# Patient Record
Sex: Female | Born: 1950 | Race: Black or African American | Hispanic: No | Marital: Single | Smoking: Never smoker
Health system: Southern US, Community
[De-identification: ages and names within clinical notes are randomized; demographics above are authoritative.]

## PROBLEM LIST (undated history)

## (undated) DIAGNOSIS — S2691XA Contusion of heart, unspecified with or without hemopericardium, initial encounter: Secondary | ICD-10-CM

## (undated) DIAGNOSIS — I214 Non-ST elevation (NSTEMI) myocardial infarction: Secondary | ICD-10-CM

## (undated) DIAGNOSIS — R7401 Elevation of levels of liver transaminase levels: Secondary | ICD-10-CM

## (undated) DIAGNOSIS — I5042 Chronic combined systolic (congestive) and diastolic (congestive) heart failure: Secondary | ICD-10-CM

## (undated) DIAGNOSIS — I4892 Unspecified atrial flutter: Secondary | ICD-10-CM

## (undated) DIAGNOSIS — R079 Chest pain, unspecified: Secondary | ICD-10-CM

## (undated) DIAGNOSIS — I10 Essential (primary) hypertension: Secondary | ICD-10-CM

## (undated) DIAGNOSIS — R9431 Abnormal electrocardiogram [ECG] [EKG]: Secondary | ICD-10-CM

## (undated) DIAGNOSIS — I255 Ischemic cardiomyopathy: Secondary | ICD-10-CM

## (undated) DIAGNOSIS — R0600 Dyspnea, unspecified: Secondary | ICD-10-CM

## (undated) DIAGNOSIS — R740 Nonspecific elevation of levels of transaminase and lactic acid dehydrogenase [LDH]: Secondary | ICD-10-CM

## (undated) DIAGNOSIS — I2510 Atherosclerotic heart disease of native coronary artery without angina pectoris: Secondary | ICD-10-CM

## (undated) DIAGNOSIS — I5043 Acute on chronic combined systolic (congestive) and diastolic (congestive) heart failure: Secondary | ICD-10-CM

## (undated) DIAGNOSIS — E785 Hyperlipidemia, unspecified: Secondary | ICD-10-CM

## (undated) HISTORY — DX: Chronic combined systolic (congestive) and diastolic (congestive) heart failure: I50.42

## (undated) HISTORY — DX: Atherosclerotic heart disease of native coronary artery without angina pectoris: I25.10

## (undated) HISTORY — DX: Hyperlipidemia, unspecified: E78.5

## (undated) HISTORY — PX: COLONOSCOPY: SHX174

## (undated) HISTORY — DX: Nonspecific elevation of levels of transaminase and lactic acid dehydrogenase (ldh): R74.0

## (undated) HISTORY — DX: Abnormal electrocardiogram (ECG) (EKG): R94.31

## (undated) HISTORY — DX: Ischemic cardiomyopathy: I25.5

## (undated) HISTORY — DX: Elevation of levels of liver transaminase levels: R74.01

---

## 1999-04-20 ENCOUNTER — Encounter: Payer: Self-pay | Admitting: Obstetrics and Gynecology

## 1999-04-20 ENCOUNTER — Encounter: Admission: RE | Admit: 1999-04-20 | Discharge: 1999-04-20 | Payer: Self-pay | Admitting: Obstetrics and Gynecology

## 2000-04-23 ENCOUNTER — Encounter: Admission: RE | Admit: 2000-04-23 | Discharge: 2000-04-23 | Payer: Self-pay | Admitting: Obstetrics and Gynecology

## 2000-04-23 ENCOUNTER — Encounter: Payer: Self-pay | Admitting: Obstetrics and Gynecology

## 2000-08-29 ENCOUNTER — Other Ambulatory Visit: Admission: RE | Admit: 2000-08-29 | Discharge: 2000-08-29 | Payer: Self-pay | Admitting: Obstetrics and Gynecology

## 2001-05-03 ENCOUNTER — Encounter: Payer: Self-pay | Admitting: Obstetrics and Gynecology

## 2001-05-03 ENCOUNTER — Encounter: Admission: RE | Admit: 2001-05-03 | Discharge: 2001-05-03 | Payer: Self-pay | Admitting: Obstetrics and Gynecology

## 2002-05-02 ENCOUNTER — Encounter: Admission: RE | Admit: 2002-05-02 | Discharge: 2002-05-02 | Payer: Self-pay | Admitting: Obstetrics and Gynecology

## 2002-05-02 ENCOUNTER — Encounter: Payer: Self-pay | Admitting: Obstetrics and Gynecology

## 2003-05-15 ENCOUNTER — Encounter: Admission: RE | Admit: 2003-05-15 | Discharge: 2003-05-15 | Payer: Self-pay | Admitting: Obstetrics and Gynecology

## 2004-04-29 ENCOUNTER — Encounter: Admission: RE | Admit: 2004-04-29 | Discharge: 2004-04-29 | Payer: Self-pay | Admitting: Gastroenterology

## 2004-05-20 ENCOUNTER — Ambulatory Visit (HOSPITAL_COMMUNITY): Admission: RE | Admit: 2004-05-20 | Discharge: 2004-05-20 | Payer: Self-pay | Admitting: Gastroenterology

## 2004-05-20 ENCOUNTER — Encounter (INDEPENDENT_AMBULATORY_CARE_PROVIDER_SITE_OTHER): Payer: Self-pay | Admitting: *Deleted

## 2004-05-27 ENCOUNTER — Ambulatory Visit (HOSPITAL_COMMUNITY): Admission: RE | Admit: 2004-05-27 | Discharge: 2004-05-27 | Payer: Self-pay | Admitting: Obstetrics and Gynecology

## 2004-12-25 ENCOUNTER — Emergency Department (HOSPITAL_COMMUNITY): Admission: EM | Admit: 2004-12-25 | Discharge: 2004-12-25 | Payer: Self-pay | Admitting: *Deleted

## 2005-07-25 ENCOUNTER — Encounter: Admission: RE | Admit: 2005-07-25 | Discharge: 2005-07-25 | Payer: Self-pay | Admitting: Obstetrics and Gynecology

## 2006-08-08 ENCOUNTER — Encounter: Admission: RE | Admit: 2006-08-08 | Discharge: 2006-08-08 | Payer: Self-pay | Admitting: Obstetrics and Gynecology

## 2007-08-09 ENCOUNTER — Encounter: Admission: RE | Admit: 2007-08-09 | Discharge: 2007-08-09 | Payer: Self-pay | Admitting: Obstetrics and Gynecology

## 2008-09-04 ENCOUNTER — Encounter: Admission: RE | Admit: 2008-09-04 | Discharge: 2008-09-04 | Payer: Self-pay | Admitting: Obstetrics and Gynecology

## 2009-09-24 ENCOUNTER — Encounter: Admission: RE | Admit: 2009-09-24 | Discharge: 2009-09-24 | Payer: Self-pay | Admitting: Obstetrics and Gynecology

## 2010-10-18 ENCOUNTER — Other Ambulatory Visit: Payer: Self-pay | Admitting: Obstetrics and Gynecology

## 2010-10-18 DIAGNOSIS — Z1231 Encounter for screening mammogram for malignant neoplasm of breast: Secondary | ICD-10-CM

## 2010-10-21 NOTE — Op Note (Signed)
NAMEBAILYNN, DYK              ACCOUNT NO.:  [id number]   MEDICAL RECORD NO.:  [id number]          PATIENT TYPE:  AMB   LOCATION:  ENDO                         FACILITY:  [facility]   PHYSICIAN:  Jordan Hawks. Elnoria Howard, MD    DATE OF BIRTH:  02/02/1951   DATE OF PROCEDURE:  05/20/2004  DATE OF DISCHARGE:                                 OPERATIVE REPORT   PROCEDURE:  Colonoscopy.   PERFORMING PHYSICIAN:  Jordan Hawks. Elnoria Howard, MD   INDICATIONS FOR PROCEDURE:  Screening colonoscopy.   CONSENT:  Informed consent was obtained from the patient describing the  risks of bleeding, infection, perforation, medication reactions, a 10% miss  rate for a small colon cancer or polyp and the risk of death all of which  are not exclusive of any other complications that may occur.   PRE-PROCEDURE PHYSICAL:  CARDIAC:  Regular rate and rhythm.  LUNGS:  Clear to auscultation bilaterally.  ABDOMEN: Soft, non-tender and non-distended.   MEDICATIONS:  Versed 4 mg IV and Demerol 50 mg IV.   DESCRIPTION OF PROCEDURE:  After adequate sedation was achieved, a rectal  examination was performed which was negative for any palpable abnormalities.  The colonoscope was then inserted and advanced under direct visualization to  the terminal ileum with mild difficulty.  Photo documentation of the  terminal ileum and the cecum was obtained and the patient was noted to have  an excellent prep.  Upon slow withdrawal of the colonoscope, there was no  evidence of any masses, polyps, ulcerations, erosions or vascular  abnormalities in the cecum, ascending, transverse, descending or sigmoid  colon.  The patient was noted to have a few scattered diverticula within the  sigmoid region.  Retroflexion in the rectum was positive for a 2 mm sessile  polyp.  A cold snare polypectomy was performed and no complications were  encountered.  The polyp was retrieved intact.  The patient was also noted to  have mild external hemorrhoids with  retroflexion in the rectum. The  colonoscope was then straightened and withdrawn from the patient.  The  procedure was terminated.  No complications were encountered and the patient  tolerated the procedure well.   PLAN:  1.  Follow-up on biopsies.  2.  Repeat colonoscopy in five to seven years.       PDH/MEDQ  D:  05/20/2004  T:  05/20/2004  Job:  [id number]   cc:   Merlene Laughter. Renae Gloss, M.D.  [address]  [address]  [place]  [place] [address]  Fax: [phone]

## 2010-11-11 ENCOUNTER — Ambulatory Visit
Admission: RE | Admit: 2010-11-11 | Discharge: 2010-11-11 | Disposition: A | Discharge status: Home / Self Care | Payer: No Typology Code available for payment source | Source: Ambulatory Visit | Attending: Obstetrics and Gynecology | Admitting: Obstetrics and Gynecology

## 2010-11-11 DIAGNOSIS — Z1231 Encounter for screening mammogram for malignant neoplasm of breast: Secondary | ICD-10-CM

## 2010-12-13 ENCOUNTER — Inpatient Hospital Stay (INDEPENDENT_AMBULATORY_CARE_PROVIDER_SITE_OTHER)
Admission: RE | Admit: 2010-12-13 | Discharge: 2010-12-13 | Disposition: A | Discharge status: Home / Self Care | Payer: No Typology Code available for payment source | Source: Ambulatory Visit | Attending: Emergency Medicine | Admitting: Emergency Medicine

## 2010-12-13 DIAGNOSIS — J40 Bronchitis, not specified as acute or chronic: Secondary | ICD-10-CM

## 2011-11-14 ENCOUNTER — Other Ambulatory Visit: Payer: Self-pay | Admitting: Obstetrics and Gynecology

## 2011-11-14 DIAGNOSIS — Z1231 Encounter for screening mammogram for malignant neoplasm of breast: Secondary | ICD-10-CM

## 2011-11-29 ENCOUNTER — Ambulatory Visit
Admission: RE | Admit: 2011-11-29 | Discharge: 2011-11-29 | Disposition: A | Discharge status: Home / Self Care | Payer: PRIVATE HEALTH INSURANCE | Source: Ambulatory Visit | Attending: Obstetrics and Gynecology | Admitting: Obstetrics and Gynecology

## 2011-11-29 DIAGNOSIS — Z1231 Encounter for screening mammogram for malignant neoplasm of breast: Secondary | ICD-10-CM

## 2012-12-12 ENCOUNTER — Other Ambulatory Visit: Payer: Self-pay

## 2012-12-12 DIAGNOSIS — Z1231 Encounter for screening mammogram for malignant neoplasm of breast: Secondary | ICD-10-CM

## 2012-12-26 ENCOUNTER — Ambulatory Visit: Payer: PRIVATE HEALTH INSURANCE

## 2013-01-08 ENCOUNTER — Ambulatory Visit: Payer: PRIVATE HEALTH INSURANCE

## 2013-01-22 ENCOUNTER — Ambulatory Visit
Admission: RE | Admit: 2013-01-22 | Discharge: 2013-01-22 | Disposition: A | Discharge status: Home / Self Care | Payer: PRIVATE HEALTH INSURANCE | Source: Ambulatory Visit

## 2013-01-22 DIAGNOSIS — Z1231 Encounter for screening mammogram for malignant neoplasm of breast: Secondary | ICD-10-CM

## 2013-03-09 ENCOUNTER — Encounter (HOSPITAL_COMMUNITY): Payer: Self-pay | Admitting: *Deleted

## 2013-03-09 ENCOUNTER — Emergency Department (HOSPITAL_COMMUNITY): Payer: PRIVATE HEALTH INSURANCE

## 2013-03-09 ENCOUNTER — Observation Stay (HOSPITAL_COMMUNITY)
Admission: EM | Admit: 2013-03-09 | Discharge: 2013-03-10 | Disposition: A | Discharge status: Home / Self Care | Payer: PRIVATE HEALTH INSURANCE | Attending: Internal Medicine | Admitting: Internal Medicine

## 2013-03-09 DIAGNOSIS — S20219A Contusion of unspecified front wall of thorax, initial encounter: Secondary | ICD-10-CM | POA: Diagnosis not present

## 2013-03-09 DIAGNOSIS — I10 Essential (primary) hypertension: Secondary | ICD-10-CM | POA: Diagnosis not present

## 2013-03-09 DIAGNOSIS — E7800 Pure hypercholesterolemia, unspecified: Secondary | ICD-10-CM | POA: Insufficient documentation

## 2013-03-09 DIAGNOSIS — S2691XA Contusion of heart, unspecified with or without hemopericardium, initial encounter: Secondary | ICD-10-CM

## 2013-03-09 DIAGNOSIS — Y939 Activity, unspecified: Secondary | ICD-10-CM | POA: Diagnosis not present

## 2013-03-09 DIAGNOSIS — S2611XA Contusion of heart without hemopericardium, initial encounter: Secondary | ICD-10-CM

## 2013-03-09 DIAGNOSIS — R079 Chest pain, unspecified: Secondary | ICD-10-CM | POA: Diagnosis present

## 2013-03-09 DIAGNOSIS — E785 Hyperlipidemia, unspecified: Secondary | ICD-10-CM | POA: Diagnosis present

## 2013-03-09 DIAGNOSIS — Z79899 Other long term (current) drug therapy: Secondary | ICD-10-CM | POA: Insufficient documentation

## 2013-03-09 DIAGNOSIS — I499 Cardiac arrhythmia, unspecified: Secondary | ICD-10-CM

## 2013-03-09 DIAGNOSIS — Y929 Unspecified place or not applicable: Secondary | ICD-10-CM | POA: Insufficient documentation

## 2013-03-09 HISTORY — DX: Essential (primary) hypertension: I10

## 2013-03-09 HISTORY — DX: Contusion of heart, unspecified with or without hemopericardium, initial encounter: S26.91XA

## 2013-03-09 LAB — CBC
HCT: 44.2 % (ref 36.0–46.0)
Hemoglobin: 15.5 g/dL — ABNORMAL HIGH (ref 12.0–15.0)
MCH: 30.8 pg (ref 26.0–34.0)
MCHC: 35.1 g/dL (ref 30.0–36.0)
MCV: 87.7 fL (ref 78.0–100.0)
Platelets: 187 10*3/uL (ref 150–400)
RBC: 5.04 MIL/uL (ref 3.87–5.11)
RDW: 14.3 % (ref 11.5–15.5)
WBC: 6.9 10*3/uL (ref 4.0–10.5)

## 2013-03-09 LAB — BASIC METABOLIC PANEL
BUN: 13 mg/dL (ref 6–23)
CO2: 28 mEq/L (ref 19–32)
Calcium: 9.8 mg/dL (ref 8.4–10.5)
Chloride: 97 mEq/L (ref 96–112)
Creatinine, Ser: 0.62 mg/dL (ref 0.50–1.10)
GFR calc Af Amer: >90 mL/min (ref >90)
GFR calc non Af Amer: >90 mL/min (ref >90)
Glucose, Bld: 97 mg/dL (ref 70–99)
Potassium: 4.1 mEq/L (ref 3.5–5.1)
Sodium: 138 mEq/L (ref 135–145)

## 2013-03-09 LAB — POCT I-STAT TROPONIN I: Troponin i, poc: 0 ng/mL (ref 0.00–0.08)

## 2013-03-09 LAB — PRO B NATRIURETIC PEPTIDE: Pro B Natriuretic peptide (BNP): 322.4 pg/mL — ABNORMAL HIGH (ref 0–125)

## 2013-03-09 LAB — TROPONIN I: Troponin I: <0.3 ng/mL (ref <0.30)

## 2013-03-09 MED ORDER — IOHEXOL 350 MG/ML SOLN
100.0000 mL | Freq: Once | INTRAVENOUS | Status: AC | PRN
  Administered 2013-03-09: 100 mL via INTRAVENOUS

## 2013-03-09 MED ORDER — SODIUM CHLORIDE 0.9 % IJ SOLN
3.0000 mL | Freq: Two times a day (BID) | INTRAMUSCULAR | Status: DC
  Administered 2013-03-10: 3 mL via INTRAVENOUS

## 2013-03-09 MED ORDER — MORPHINE SULFATE 2 MG/ML IJ SOLN
1.0000 mg | INTRAMUSCULAR | Status: DC | PRN
  Administered 2013-03-10: 1 mg via INTRAVENOUS
  Filled 2013-03-09: qty 1

## 2013-03-09 MED ORDER — SIMVASTATIN 20 MG PO TABS
20.0000 mg | ORAL_TABLET | Freq: Every day | ORAL | Status: DC
  Administered 2013-03-09: 20 mg via ORAL
  Filled 2013-03-09 (×2): qty 1

## 2013-03-09 MED ORDER — LOSARTAN POTASSIUM 50 MG PO TABS
100.0000 mg | ORAL_TABLET | Freq: Every day | ORAL | Status: DC
  Administered 2013-03-10: 100 mg via ORAL
  Filled 2013-03-09: qty 2

## 2013-03-09 MED ORDER — ASPIRIN EC 325 MG PO TBEC
325.0000 mg | DELAYED_RELEASE_TABLET | Freq: Every day | ORAL | Status: DC
  Administered 2013-03-09 – 2013-03-10 (×2): 325 mg via ORAL
  Filled 2013-03-09 (×2): qty 1

## 2013-03-09 MED ORDER — LOSARTAN POTASSIUM-HCTZ 100-25 MG PO TABS: 1.0000 | ORAL_TABLET | Freq: Every day | ORAL | Status: DC

## 2013-03-09 MED ORDER — HYDROCHLOROTHIAZIDE 25 MG PO TABS
25.0000 mg | ORAL_TABLET | Freq: Every day | ORAL | Status: DC
  Administered 2013-03-10: 25 mg via ORAL
  Filled 2013-03-09: qty 1
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
## 2013-03-10 DIAGNOSIS — I359 Nonrheumatic aortic valve disorder, unspecified: Secondary | ICD-10-CM

## 2013-03-10 DIAGNOSIS — I10 Essential (primary) hypertension: Secondary | ICD-10-CM | POA: Diagnosis not present

## 2013-03-10 LAB — TROPONIN I: Troponin I: <0.3 ng/mL (ref <0.30)

## 2013-03-10 MED ORDER — HYDROCODONE-ACETAMINOPHEN 5-325 MG PO TABS: 1.0000 | ORAL_TABLET | Freq: Four times a day (QID) | ORAL | Status: DC | PRN
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
## 2013-03-10 NOTE — Progress Notes (Signed)
Utilization review completed.  

## 2013-03-10 NOTE — Progress Notes (Signed)
D/c orders received:IV removed with gauze on, pt remains in stable condition, reviewed pt meds and instructions and given to pt; pt d/c to home

## 2013-03-10 NOTE — Progress Notes (Signed)
*  PRELIMINARY RESULTS* Echocardiogram 2D Echocardiogram has been performed.  Sheri Dunn 03/10/2013, 11:08 AM

## 2013-03-10 NOTE — Progress Notes (Signed)
[facility]  [address] [id number] [place] [address] Phone: [phone]  March 10, 2013  Patient: Sheri Dunn  Date of Birth: 1951/04/08  Date of Visit: 03/09/2013    To Whom It May Concern:  Sheri Dunn was seen and treated in our emergency department on 03/09/2013. Kyle Stansell  may return to work on 03/17/13.  Sincerely,     Crista Curb, M.D.

## 2014-01-20 ENCOUNTER — Other Ambulatory Visit: Payer: Self-pay

## 2014-01-20 DIAGNOSIS — Z1231 Encounter for screening mammogram for malignant neoplasm of breast: Secondary | ICD-10-CM

## 2014-02-10 ENCOUNTER — Ambulatory Visit
Admission: RE | Admit: 2014-02-10 | Discharge: 2014-02-10 | Disposition: A | Discharge status: Home / Self Care | Payer: PRIVATE HEALTH INSURANCE | Source: Ambulatory Visit

## 2014-02-10 DIAGNOSIS — Z1231 Encounter for screening mammogram for malignant neoplasm of breast: Secondary | ICD-10-CM

## 2014-09-05 ENCOUNTER — Inpatient Hospital Stay (HOSPITAL_COMMUNITY)
Admission: EM | Admit: 2014-09-05 | Discharge: 2014-09-09 | DRG: 246 | Disposition: A | Discharge status: Home / Self Care | Payer: PRIVATE HEALTH INSURANCE | Attending: Internal Medicine | Admitting: Internal Medicine

## 2014-09-05 ENCOUNTER — Emergency Department (HOSPITAL_COMMUNITY): Payer: PRIVATE HEALTH INSURANCE

## 2014-09-05 ENCOUNTER — Encounter (HOSPITAL_COMMUNITY): Payer: Self-pay | Admitting: *Deleted

## 2014-09-05 ENCOUNTER — Emergency Department (HOSPITAL_COMMUNITY)
Admission: EM | Admit: 2014-09-05 | Discharge: 2014-09-05 | Disposition: A | Discharge status: Nursing Home (Includes ICF) | Payer: PRIVATE HEALTH INSURANCE | Source: Home / Self Care | Attending: Family Medicine | Admitting: Family Medicine

## 2014-09-05 ENCOUNTER — Encounter (HOSPITAL_COMMUNITY): Payer: Self-pay | Admitting: Emergency Medicine

## 2014-09-05 DIAGNOSIS — I509 Heart failure, unspecified: Secondary | ICD-10-CM

## 2014-09-05 DIAGNOSIS — Z8249 Family history of ischemic heart disease and other diseases of the circulatory system: Secondary | ICD-10-CM

## 2014-09-05 DIAGNOSIS — R739 Hyperglycemia, unspecified: Secondary | ICD-10-CM | POA: Diagnosis present

## 2014-09-05 DIAGNOSIS — Z91128 Patient's intentional underdosing of medication regimen for other reason: Secondary | ICD-10-CM | POA: Diagnosis present

## 2014-09-05 DIAGNOSIS — I255 Ischemic cardiomyopathy: Secondary | ICD-10-CM

## 2014-09-05 DIAGNOSIS — I5020 Unspecified systolic (congestive) heart failure: Secondary | ICD-10-CM | POA: Diagnosis not present

## 2014-09-05 DIAGNOSIS — I5021 Acute systolic (congestive) heart failure: Secondary | ICD-10-CM

## 2014-09-05 DIAGNOSIS — I2510 Atherosclerotic heart disease of native coronary artery without angina pectoris: Secondary | ICD-10-CM | POA: Diagnosis present

## 2014-09-05 DIAGNOSIS — I5041 Acute combined systolic (congestive) and diastolic (congestive) heart failure: Secondary | ICD-10-CM | POA: Diagnosis present

## 2014-09-05 DIAGNOSIS — I10 Essential (primary) hypertension: Secondary | ICD-10-CM | POA: Diagnosis present

## 2014-09-05 DIAGNOSIS — R945 Abnormal results of liver function studies: Secondary | ICD-10-CM | POA: Diagnosis present

## 2014-09-05 DIAGNOSIS — R7989 Other specified abnormal findings of blood chemistry: Secondary | ICD-10-CM | POA: Diagnosis present

## 2014-09-05 DIAGNOSIS — I4581 Long QT syndrome: Secondary | ICD-10-CM | POA: Diagnosis not present

## 2014-09-05 DIAGNOSIS — R079 Chest pain, unspecified: Secondary | ICD-10-CM

## 2014-09-05 DIAGNOSIS — R740 Nonspecific elevation of levels of transaminase and lactic acid dehydrogenase [LDH]: Secondary | ICD-10-CM | POA: Diagnosis present

## 2014-09-05 DIAGNOSIS — E785 Hyperlipidemia, unspecified: Secondary | ICD-10-CM | POA: Diagnosis not present

## 2014-09-05 DIAGNOSIS — E780 Pure hypercholesterolemia: Secondary | ICD-10-CM | POA: Diagnosis present

## 2014-09-05 DIAGNOSIS — R0600 Dyspnea, unspecified: Secondary | ICD-10-CM

## 2014-09-05 DIAGNOSIS — I214 Non-ST elevation (NSTEMI) myocardial infarction: Principal | ICD-10-CM

## 2014-09-05 DIAGNOSIS — Z9119 Patient's noncompliance with other medical treatment and regimen: Secondary | ICD-10-CM | POA: Diagnosis present

## 2014-09-05 DIAGNOSIS — T465X6A Underdosing of other antihypertensive drugs, initial encounter: Secondary | ICD-10-CM | POA: Diagnosis present

## 2014-09-05 DIAGNOSIS — Z955 Presence of coronary angioplasty implant and graft: Secondary | ICD-10-CM

## 2014-09-05 DIAGNOSIS — R9431 Abnormal electrocardiogram [ECG] [EKG]: Secondary | ICD-10-CM | POA: Diagnosis present

## 2014-09-05 DIAGNOSIS — R002 Palpitations: Secondary | ICD-10-CM

## 2014-09-05 DIAGNOSIS — R778 Other specified abnormalities of plasma proteins: Secondary | ICD-10-CM | POA: Diagnosis present

## 2014-09-05 LAB — BASIC METABOLIC PANEL
ANION GAP: 12 (ref 5–15)
BUN: 24 mg/dL — ABNORMAL HIGH (ref 6–23)
CHLORIDE: 110 mmol/L (ref 96–112)
CO2: 17 mmol/L — AB (ref 19–32)
CREATININE: 0.83 mg/dL (ref 0.50–1.10)
Calcium: 9.1 mg/dL (ref 8.4–10.5)
GFR calc Af Amer: 84 mL/min — ABNORMAL LOW (ref >90)
GFR calc non Af Amer: 72 mL/min — ABNORMAL LOW (ref >90)
Glucose, Bld: 113 mg/dL — ABNORMAL HIGH (ref 70–99)
POTASSIUM: 4.2 mmol/L (ref 3.5–5.1)
SODIUM: 139 mmol/L (ref 135–145)

## 2014-09-05 LAB — CBC
HEMATOCRIT: 42.3 % (ref 36.0–46.0)
HEMOGLOBIN: 13.7 g/dL (ref 12.0–15.0)
MCH: 28.5 pg (ref 26.0–34.0)
MCHC: 32.4 g/dL (ref 30.0–36.0)
MCV: 88.1 fL (ref 78.0–100.0)
Platelets: 180 10*3/uL (ref 150–400)
RBC: 4.8 MIL/uL (ref 3.87–5.11)
RDW: 15.3 % (ref 11.5–15.5)
WBC: 7.5 10*3/uL (ref 4.0–10.5)

## 2014-09-05 LAB — D-DIMER, QUANTITATIVE (NOT AT ARMC): D DIMER QUANT: 1.41 ug{FEU}/mL — AB (ref 0.00–0.48)

## 2014-09-05 LAB — I-STAT TROPONIN, ED: Troponin i, poc: 0.05 ng/mL (ref 0.00–0.08)

## 2014-09-05 LAB — BRAIN NATRIURETIC PEPTIDE: B Natriuretic Peptide: 4342.6 pg/mL — ABNORMAL HIGH (ref 0.0–100.0)

## 2014-09-05 LAB — TROPONIN I: Troponin I: 0.08 ng/mL — ABNORMAL HIGH (ref <0.031)

## 2014-09-05 MED ORDER — FUROSEMIDE 10 MG/ML IJ SOLN
20.0000 mg | Freq: Once | INTRAMUSCULAR | Status: AC
  Administered 2014-09-05: 20 mg via INTRAVENOUS
  Filled 2014-09-05: qty 2

## 2014-09-05 MED ORDER — NITROGLYCERIN 2 % TD OINT
1.0000 [in_us] | TOPICAL_OINTMENT | Freq: Three times a day (TID) | TRANSDERMAL | Status: DC
  Administered 2014-09-06 – 2014-09-07 (×5): 1 [in_us] via TOPICAL
  Filled 2014-09-05: qty 30

## 2014-09-05 MED ORDER — SODIUM CHLORIDE 0.9 % IJ SOLN
3.0000 mL | Freq: Two times a day (BID) | INTRAMUSCULAR | Status: DC
  Administered 2014-09-07 – 2014-09-09 (×2): 3 mL via INTRAVENOUS

## 2014-09-05 MED ORDER — SODIUM CHLORIDE 0.9 % IJ SOLN: 3.0000 mL | INTRAMUSCULAR | Status: DC | PRN

## 2014-09-05 MED ORDER — IOHEXOL 350 MG/ML SOLN
100.0000 mL | Freq: Once | INTRAVENOUS | Status: AC | PRN
  Administered 2014-09-05: 100 mL via INTRAVENOUS

## 2014-09-05 MED ORDER — SODIUM CHLORIDE 0.9 % IJ SOLN
3.0000 mL | Freq: Two times a day (BID) | INTRAMUSCULAR | Status: DC
  Administered 2014-09-06 – 2014-09-08 (×6): 3 mL via INTRAVENOUS

## 2014-09-05 MED ORDER — SODIUM CHLORIDE 0.9 % IV SOLN: 250.0000 mL | INTRAVENOUS | Status: DC | PRN

## 2014-09-05 MED ORDER — NITROGLYCERIN 2 % TD OINT
1.0000 [in_us] | TOPICAL_OINTMENT | Freq: Once | TRANSDERMAL | Status: AC
  Administered 2014-09-05: 1 [in_us] via TOPICAL
  Filled 2014-09-05: qty 1

## 2014-09-05 MED ORDER — ACETAMINOPHEN 650 MG RE SUPP: 650.0000 mg | Freq: Four times a day (QID) | RECTAL | Status: DC | PRN

## 2014-09-05 MED ORDER — ATORVASTATIN CALCIUM 80 MG PO TABS
80.0000 mg | ORAL_TABLET | Freq: Every day | ORAL | Status: DC
Start: 2014-09-06 — End: 2014-09-07
  Administered 2014-09-06: 80 mg via ORAL
  Filled 2014-09-05 (×2): qty 1

## 2014-09-05 MED ORDER — IRBESARTAN 75 MG PO TABS
75.0000 mg | ORAL_TABLET | Freq: Every day | ORAL | Status: DC
  Filled 2014-09-05: qty 1

## 2014-09-05 MED ORDER — ENOXAPARIN SODIUM 80 MG/0.8ML ~~LOC~~ SOLN
1.0000 mg/kg | Freq: Once | SUBCUTANEOUS | Status: AC
  Administered 2014-09-06: 65 mg via SUBCUTANEOUS
  Filled 2014-09-05: qty 0.8

## 2014-09-05 MED ORDER — ASPIRIN EC 325 MG PO TBEC
325.0000 mg | DELAYED_RELEASE_TABLET | Freq: Every day | ORAL | Status: DC
Start: 2014-09-06 — End: 2014-09-07
  Administered 2014-09-06 – 2014-09-07 (×2): 325 mg via ORAL
  Filled 2014-09-05 (×2): qty 1

## 2014-09-05 MED ORDER — HYDRALAZINE HCL 20 MG/ML IJ SOLN: 10.0000 mg | Freq: Four times a day (QID) | INTRAMUSCULAR | Status: DC | PRN

## 2014-09-05 MED ORDER — ACETAMINOPHEN 325 MG PO TABS: 650.0000 mg | ORAL_TABLET | Freq: Four times a day (QID) | ORAL | Status: DC | PRN

## 2014-09-05 MED ORDER — FUROSEMIDE 10 MG/ML IJ SOLN
20.0000 mg | Freq: Two times a day (BID) | INTRAMUSCULAR | Status: DC
  Filled 2014-09-05 (×2): qty 2

## 2014-09-05 NOTE — ED Notes (Signed)
Per CT, pt's IV infiltrated, and approx 50mL of fluid was infused in the pt's forearm. Ice was applied, and Dr. Hyacinth MeekerMiller assessed the pt's IV site. They have started a new IV to complete the scans.

## 2014-09-05 NOTE — ED Provider Notes (Signed)
Sheri Dunn Sheri Dunn a 64 y.o. female who presents to Urgent Care today for chest soreness shortness of breath and palpitations. Symptoms present for about one week. Patient notes her symptoms are worse with exertion and better with rest. She describes a heart fluttering sensation associated with increased work of breathing. No fevers or chills nausea vomiting or diarrhea. No leg swelling. No radiating pain.   Past Medical History  Diagnosis Date  . Hypertension   . High cholesterol    No past surgical history on file. History  Substance Use Topics  . Smoking status: Never Smoker   . Smokeless tobacco: Not on file  . Alcohol Use: No   ROS as above Medications: No current facility-administered medications for this encounter.   Current Outpatient Prescriptions  Medication Sig Dispense Refill  . HYDROcodone-acetaminophen (NORCO/VICODIN) 5-325 MG per tablet Take 1 tablet by mouth every 6 (six) hours as needed for pain. 20 tablet 0  . losartan-hydrochlorothiazide (HYZAAR) 100-25 MG per tablet Take 1 tablet by mouth daily.    . simvastatin (ZOCOR) 20 MG tablet Take 20 mg by mouth daily.     No Known Allergies   Exam:  BP 142/101 mmHg  Pulse 90  Temp(Src) 97.7 F (36.5 C) (Oral)  Resp 18  SpO2 100% Gen: Well NAD HEENT: EOMI,  MMM Lungs: Normal work of breathing. CTABL Heart: Irregularly irregular on auscultated exam no MRG Abd: NABS, Soft. Nondistended, Nontender Exts: Brisk capillary refill, warm and well perfused.   ED ECG REPORT   Date: 09/05/2014  Rate: 90  Rhythm: premature ventricular contractions (PVC)  QRS Axis: normal  Intervals: QT prolonged and 484  ST/T Wave abnormalities: Lateral precordial leads T-wave inversions. No ST segment elevation.  Conduction Disutrbances:none  Narrative Interpretation: Normal sinus rhythm with frequent regular PVCs. Morphology of non-PVC beats is abnormal with lateral precordial T wave inversions. No significant ST segment elevation or  depression.  Old EKG Reviewed: changes noted  I have personally reviewed the EKG tracing and agree with the computerized printout as noted.   No results found for this or any previous visit (from the past 24 hour(s)). No results found.  Assessment and Plan: 64 y.o. female with palpitations associated with frequent PVCs and T-wave inversions. Transfer to ED for further evaluation and management. Patient declines EMS transport. We'll use shuttle.  Discussed warning signs or symptoms. Please see discharge instructions. Patient expresses understanding.     Rodolph BongEvan S Adonte Vanriper, MD 09/05/14 1420

## 2014-09-05 NOTE — H&P (Signed)
Sheri Dunn is an 64 y.o. female.    Willey Blade PA Chief Complaint: dyspnea HPI: 64 yo female with htn, hyperlipidemia c/o dyspnea since Monday.  Denies fever, chills, cp, palp, n/v, diarrhea, wt gain, lower ext edema.   Pt presented to ED and bp elevated, and CXR showed CHF.  Pt will be admitted for new onset CHF.   Past Medical History  Diagnosis Date  . Hypertension   . High cholesterol     History reviewed. No pertinent past surgical history.  Family History  Problem Relation Age of Onset  . Heart attack Mother    Social History:  reports that she has never smoked. She does not have any smokeless tobacco history on file. She reports that she does not drink alcohol or use illicit drugs.  Allergies: No Known Allergies Medications reviewed  (Not in a hospital admission)  Results for orders placed or performed during the hospital encounter of 09/05/14 (from the past 48 hour(s))  CBC     Status: None   Collection Time: 09/05/14  3:11 PM  Result Value Ref Range   WBC 7.5 4.0 - 10.5 K/uL   RBC 4.80 3.87 - 5.11 MIL/uL   Hemoglobin 13.7 12.0 - 15.0 g/dL   HCT 42.3 36.0 - 46.0 %   MCV 88.1 78.0 - 100.0 fL   MCH 28.5 26.0 - 34.0 pg   MCHC 32.4 30.0 - 36.0 g/dL   RDW 15.3 11.5 - 15.5 %   Platelets 180 150 - 400 K/uL  Basic metabolic panel     Status: Abnormal   Collection Time: 09/05/14  3:11 PM  Result Value Ref Range   Sodium 139 135 - 145 mmol/L   Potassium 4.2 3.5 - 5.1 mmol/L   Chloride 110 96 - 112 mmol/L   CO2 17 (L) 19 - 32 mmol/L   Glucose, Bld 113 (H) 70 - 99 mg/dL   BUN 24 (H) 6 - 23 mg/dL   Creatinine, Ser 0.83 0.50 - 1.10 mg/dL   Calcium 9.1 8.4 - 10.5 mg/dL   GFR calc non Af Amer 72 (L) >90 mL/min   GFR calc Af Amer 84 (L) >90 mL/min    Comment: (NOTE) The eGFR has been calculated using the CKD EPI equation. This calculation has not been validated in all clinical situations. eGFR's persistently <90 mL/min signify possible Chronic Kidney Disease.     Anion gap 12 5 - 15  BNP (order ONLY if patient complains of dyspnea/SOB AND you have documented it for THIS visit)     Status: Abnormal   Collection Time: 09/05/14  3:11 PM  Result Value Ref Range   B Natriuretic Peptide 4342.6 (H) 0.0 - 100.0 pg/mL  I-stat troponin, ED (not at [facility])     Status: None   Collection Time: 09/05/14  3:26 PM  Result Value Ref Range   Troponin i, poc 0.05 0.00 - 0.08 ng/mL   Comment 3            Comment: Due to the release kinetics of cTnI, a negative result within the first hours of the onset of symptoms does not rule out myocardial infarction with certainty. If myocardial infarction is still suspected, repeat the test at appropriate intervals.   Troponin I     Status: Abnormal   Collection Time: 09/05/14  5:41 PM  Result Value Ref Range   Troponin I 0.08 (H) <0.031 ng/mL    Comment:        PERSISTENTLY INCREASED  TROPONIN VALUES IN THE RANGE OF 0.04-0.49 ng/mL CAN BE SEEN IN:       -UNSTABLE ANGINA       -CONGESTIVE HEART FAILURE       -MYOCARDITIS       -CHEST TRAUMA       -ARRYHTHMIAS       -LATE PRESENTING MYOCARDIAL INFARCTION       -COPD   CLINICAL FOLLOW-UP RECOMMENDED.   D-dimer, quantitative     Status: Abnormal   Collection Time: 09/05/14  5:41 PM  Result Value Ref Range   D-Dimer, Quant 1.41 (H) 0.00 - 0.48 ug/mL-FEU    Comment:        AT THE INHOUSE ESTABLISHED CUTOFF VALUE OF 0.48 ug/mL FEU, THIS ASSAY HAS BEEN DOCUMENTED IN THE LITERATURE TO HAVE A SENSITIVITY AND NEGATIVE PREDICTIVE VALUE OF AT LEAST 98 TO 99%.  THE TEST RESULT SHOULD BE CORRELATED WITH AN ASSESSMENT OF THE CLINICAL PROBABILITY OF DVT / VTE.    Dg Chest 2 View  09/05/2014   CLINICAL DATA:  Dry cough and shortness of breath for 5 days  EXAM: CHEST  2 VIEW  COMPARISON:  03/09/2013  FINDINGS: Cardiac shadow is enlarged but stable. The lungs are hyperinflated. Mild interstitial changes are seen although no focal infiltrate is noted. No bony abnormality is  seen.  IMPRESSION: COPD without acute abnormality   Electronically Signed   By: Inez Catalina M.D.   On: 09/05/2014 16:01   Ct Angio Chest Pe W/cm &/or Wo Cm  09/05/2014   CLINICAL DATA:  Dyspnea.  Chest soreness.  Shortness of breath.  EXAM: CT ANGIOGRAPHY CHEST WITH CONTRAST  TECHNIQUE: Multidetector CT imaging of the chest was performed using the standard protocol during bolus administration of intravenous contrast. Multiplanar CT image reconstructions and MIPs were obtained to evaluate the vascular anatomy.  CONTRAST:  170m OMNIPAQUE IOHEXOL 350 MG/ML SOLN  COMPARISON:  03/09/2013  FINDINGS: Despite efforts by the technologist and patient, motion artifact is present on today's exam and could not be eliminated. This reduces exam sensitivity and specificity.  Mediastinum/Nodes: No filling defect is identified in the pulmonary arterial tree to suggest pulmonary embolus.  Imaging is performed prior to aortic opacification and accordingly today' s exam is not sensitive in assessing the aorta 4 certain vascular conditions such as dissection.  Borderline prominent AP window lymph nodes. There is moderate to prominent cardiomegaly primarily involving the left heart. Coronary atherosclerosis. Large main pulmonary artery at 4.1 cm ; ascending aorta at 3.9 cm.  Lungs/Pleura: Secondary pulmonary lobular interstitial thickening is present in the lungs although is mild. There is bibasilar atelectasis along with bilateral airway thickening. Small right and trace left pleural effusions  Upper abdomen: Unremarkable  Musculoskeletal: Unremarkable  Review of the MIP images confirms the above findings.  IMPRESSION: 1. No filling defect is identified in the pulmonary arterial tree to suggest pulmonary embolus. 2. Moderate to prominent cardiomegaly especially involving the left heart, with coronary atherosclerosis, and also interstitial accentuation which may reflect interstitial pulmonary edema. 3. Small right and trace left  pleural effusions. Mild atelectasis in both lower lobes. 4. Airway thickening may reflect mild interstitial edema or bronchitis. 5. Prominent main pulmonary artery could reflect pulmonary arterial hypertension.   Electronically Signed   By: WVan ClinesM.D.   On: 09/05/2014 21:41    Review of Systems  Constitutional: Negative.   HENT: Negative.   Eyes: Negative.   Respiratory: Positive for shortness of breath. Negative for cough, hemoptysis,  sputum production and wheezing.   Cardiovascular: Negative.   Gastrointestinal: Negative.   Genitourinary: Negative.   Musculoskeletal: Negative.   Skin: Negative.   Neurological: Negative.   Endo/Heme/Allergies: Negative.   Psychiatric/Behavioral: Negative.     Blood pressure 162/86, pulse 94, temperature 97.7 F (36.5 C), temperature source Oral, resp. rate 23, SpO2 98 %. Physical Exam  Constitutional: She is oriented to person, place, and time. She appears well-developed and well-nourished.  HENT:  Head: Normocephalic and atraumatic.  Mouth/Throat: No oropharyngeal exudate.  Eyes: Conjunctivae and EOM are normal. Pupils are equal, round, and reactive to light. No scleral icterus.  Neck: Normal range of motion. Neck supple. JVD present. No tracheal deviation present. No thyromegaly present.  Cardiovascular: Normal rate and regular rhythm.  Exam reveals no gallop and no friction rub.   No murmur heard. Respiratory: Effort normal. No respiratory distress. She has no wheezes. She has rales. She exhibits no tenderness.  GI: Soft. Bowel sounds are normal. She exhibits no distension. There is no tenderness. There is no rebound and no guarding.  Musculoskeletal: Normal range of motion. She exhibits no edema or tenderness.  Lymphadenopathy:    She has no cervical adenopathy.  Neurological: She is alert and oriented to person, place, and time. She has normal reflexes. She displays normal reflexes. No cranial nerve deficit. She exhibits normal  muscle tone. Coordination normal.  Skin: Skin is warm and dry. No rash noted. No erythema. No pallor.  Psychiatric: She has a normal mood and affect. Her behavior is normal. Judgment and thought content normal.     Assessment/Plan CHF  Lasix iv, NTP Trop i q6h x3 Check cpk, mb q6h x3 Check echo Strict i and o Check daily weight  Hypertension Uncontrolled NTP Hydralazine 7m iv q6h prn  Hyperglycemia Check hga1c  + trop cardiology consultation Aspirin , lipitor , lovenox 121m/kg [place] x1   Gregery Walberg 09/05/2014, 10:40 PM

## 2014-09-05 NOTE — ED Notes (Signed)
Reports SOB and rapid palpitations onset Monday Hx of Afib; sx increases when she is active See providers note Alert, no signs of acute distress.

## 2014-09-05 NOTE — ED Notes (Signed)
Pt sent here from ucc, reports mild sob since Monday. Denies cp, denies swelling to legs. spo2 98% at triage, ekg done.

## 2014-09-05 NOTE — ED Provider Notes (Signed)
CSN: [id number]     Arrival date & time 09/05/14  1433 History   First MD Initiated Contact with Patient 09/05/14 1555     Chief Complaint  Patient presents with  . Shortness of Breath     (Consider location/radiation/quality/duration/timing/severity/associated sxs/prior Treatment) HPI Complains of shortness of breath onset 5 days ago. Intermittent episodes lasting 1 minute of time.Marland Kitchen No chest pain no nausea or sweatiness. Nothing makes symptoms better or worse. No treatment prior to coming here. She also reports nonproductive cough for one week. No other associated symptoms Past Medical History  Diagnosis Date  . Hypertension   . High cholesterol    History reviewed. No pertinent past surgical history. History reviewed. No pertinent family history. History  Substance Use Topics  . Smoking status: Never Smoker   . Smokeless tobacco: Not on file  . Alcohol Use: No   OB History    No data available     Review of Systems  Constitutional: Negative.   HENT: Negative.   Respiratory: Positive for cough and shortness of breath.   Cardiovascular: Negative.   Gastrointestinal: Negative.   Musculoskeletal: Negative.   Skin: Negative.   Neurological: Negative.   Psychiatric/Behavioral: Negative.   All other systems reviewed and are negative.     Allergies  Review of patient's allergies indicates no known allergies.  Home Medications   Prior to Admission medications   Medication Sig Start Date End Date Taking? Authorizing Provider  HYDROcodone-acetaminophen (NORCO/VICODIN) 5-325 MG per tablet Take 1 tablet by mouth every 6 (six) hours as needed for pain. 03/10/13   Christiane Ha, MD  losartan-hydrochlorothiazide (HYZAAR) 100-25 MG per tablet Take 1 tablet by mouth daily. 02/20/13   Historical Provider, MD  simvastatin (ZOCOR) 20 MG tablet Take 20 mg by mouth daily. 02/20/13   Historical Provider, MD   BP 170/117 mmHg  Pulse 67  Temp(Src) 97.7 F (36.5 C) (Oral)  Resp 18   SpO2 98% Physical Exam  Constitutional: She appears well-developed and well-nourished.  HENT:  Head: Normocephalic and atraumatic.  Eyes: Conjunctivae are normal. Pupils are equal, round, and reactive to light.  Neck: Neck supple. No tracheal deviation present. No thyromegaly present.  Cardiovascular: Normal rate and regular rhythm.   No murmur heard. Pulmonary/Chest: Effort normal and breath sounds normal.  Abdominal: Soft. Bowel sounds are normal. She exhibits no distension. There is no tenderness.  Musculoskeletal: Normal range of motion. She exhibits no edema or tenderness.  Neurological: She is alert. Coordination normal.  Skin: Skin is warm and dry. No rash noted.  Psychiatric: She has a normal mood and affect.  Nursing note and vitals reviewed.   ED Course  Procedures (including critical care time) Labs Review Labs Reviewed  CBC  BASIC METABOLIC PANEL  BRAIN NATRIURETIC PEPTIDE  I-STAT TROPOININ, ED    Imaging Review No results found.   EKG Interpretation   Date/Time:  Saturday September 05 2014 14:49:33 EDT Ventricular Rate:  91 PR Interval:  144 QRS Duration: 84 QT Interval:  416 QTC Calculation: 511 R Axis:   62 Text Interpretation:  Sinus rhythm with occasional Premature ventricular  complexes Biatrial enlargement T wave abnormality, consider inferior  ischemia T wave abnormality, consider anterolateral ischemia Prolonged QT  Abnormal ECG No significant change since last tracing inferolateral  ischemia new since 12/25/04 Confirmed by Ethelda Chick  MD, Timiko Offutt [phone]) on  09/05/2014 3:58:09 PM     Patient's intravenous line report infiltrated while in CT scan. Upon reexamination of her left  arm she denies tenderness. Neurovascularly intact. No signs of compartment syndrome. Results for orders placed or performed during the hospital encounter of 09/05/14  CBC  Result Value Ref Range   WBC 7.5 4.0 - 10.5 K/uL   RBC 4.80 3.87 - 5.11 MIL/uL   Hemoglobin 13.7 12.0 -  15.0 g/dL   HCT 16.142.3 09.636.0 - 04.546.0 %   MCV 88.1 78.0 - 100.0 fL   MCH 28.5 26.0 - 34.0 pg   MCHC 32.4 30.0 - 36.0 g/dL   RDW 40.915.3 81.111.5 - 91.415.5 %   Platelets 180 150 - 400 K/uL  Basic metabolic panel  Result Value Ref Range   Sodium 139 135 - 145 mmol/L   Potassium 4.2 3.5 - 5.1 mmol/L   Chloride 110 96 - 112 mmol/L   CO2 17 (L) 19 - 32 mmol/L   Glucose, Bld 113 (H) 70 - 99 mg/dL   BUN 24 (H) 6 - 23 mg/dL   Creatinine, Ser 7.820.83 0.50 - 1.10 mg/dL   Calcium 9.1 8.4 - 95.610.5 mg/dL   GFR calc non Af Amer 72 (L) >90 mL/min   GFR calc Af Amer 84 (L) >90 mL/min   Anion gap 12 5 - 15  BNP (order ONLY if patient complains of dyspnea/SOB AND you have documented it for THIS visit)  Result Value Ref Range   B Natriuretic Peptide 4342.6 (H) 0.0 - 100.0 pg/mL  Troponin I  Result Value Ref Range   Troponin I 0.08 (H) <0.031 ng/mL  D-dimer, quantitative  Result Value Ref Range   D-Dimer, Quant 1.41 (H) 0.00 - 0.48 ug/mL-FEU  [facility] troponin[facility])  Result Value Ref Range   Troponin i, poc 0.05 0.00 - 0.08 ng/mL   Comment 3           Dg Chest 2 View  09/05/2014   CLINICAL DATA:  Dry cough and shortness of breath for 5 days  EXAM: CHEST  2 VIEW  COMPARISON:  03/09/2013  FINDINGS: Cardiac shadow is enlarged but stable. The lungs are hyperinflated. Mild interstitial changes are seen although no focal infiltrate is noted. No bony abnormality is seen.  IMPRESSION: COPD without acute abnormality   Electronically Signed   By: Alcide CleverMark  Lukens M.D.   On: 09/05/2014 16:01   Ct Angio Chest Pe W/cm &/or Wo Cm  09/05/2014   CLINICAL DATA:  Dyspnea.  Chest soreness.  Shortness of breath.  EXAM: CT ANGIOGRAPHY CHEST WITH CONTRAST  TECHNIQUE: Multidetector CT imaging of the chest was performed using the standard protocol during bolus administration of intravenous contrast. Multiplanar CT image reconstructions and MIPs were obtained to evaluate the vascular anatomy.  CONTRAST:  100mL OMNIPAQUE IOHEXOL 350 MG/ML  SOLN  COMPARISON:  03/09/2013  FINDINGS: Despite efforts by the technologist and patient, motion artifact is present on today's exam and could not be eliminated. This reduces exam sensitivity and specificity.  Mediastinum/Nodes: No filling defect is identified in the pulmonary arterial tree to suggest pulmonary embolus.  Imaging is performed prior to aortic opacification and accordingly today' s exam is not sensitive in assessing the aorta 4 certain vascular conditions such as dissection.  Borderline prominent AP window lymph nodes. There is moderate to prominent cardiomegaly primarily involving the left heart. Coronary atherosclerosis. Large main pulmonary artery at 4.1 cm ; ascending aorta at 3.9 cm.  Lungs/Pleura: Secondary pulmonary lobular interstitial thickening is present in the lungs although is mild. There is bibasilar atelectasis along with bilateral airway thickening. Small  right and trace left pleural effusions  Upper abdomen: Unremarkable  Musculoskeletal: Unremarkable  Review of the MIP images confirms the above findings.  IMPRESSION: 1. No filling defect is identified in the pulmonary arterial tree to suggest pulmonary embolus. 2. Moderate to prominent cardiomegaly especially involving the left heart, with coronary atherosclerosis, and also interstitial accentuation which may reflect interstitial pulmonary edema. 3. Small right and trace left pleural effusions. Mild atelectasis in both lower lobes. 4. Airway thickening may reflect mild interstitial edema or bronchitis. 5. Prominent main pulmonary artery could reflect pulmonary arterial hypertension.   Electronically Signed   By: Gaylyn Rong M.D.   On: 09/05/2014 21:41   Chest xray viewed by me MDM  Patient clinically and mild congestive heart failure. Spoke with Dr. Selena Batten PLan admit telemetry, diuretics, nitrates Diagnosis congestive heart failure Final diagnoses:  None        Doug Sou, MD 09/05/14 2219

## 2014-09-06 DIAGNOSIS — I214 Non-ST elevation (NSTEMI) myocardial infarction: Secondary | ICD-10-CM

## 2014-09-06 DIAGNOSIS — I509 Heart failure, unspecified: Secondary | ICD-10-CM

## 2014-09-06 DIAGNOSIS — R7989 Other specified abnormal findings of blood chemistry: Secondary | ICD-10-CM

## 2014-09-06 DIAGNOSIS — I5020 Unspecified systolic (congestive) heart failure: Secondary | ICD-10-CM

## 2014-09-06 DIAGNOSIS — I10 Essential (primary) hypertension: Secondary | ICD-10-CM

## 2014-09-06 DIAGNOSIS — R778 Other specified abnormalities of plasma proteins: Secondary | ICD-10-CM | POA: Diagnosis present

## 2014-09-06 LAB — COMPREHENSIVE METABOLIC PANEL
ALK PHOS: 111 U/L (ref 39–117)
ALT: 192 U/L — AB (ref 0–35)
AST: 84 U/L — ABNORMAL HIGH (ref 0–37)
Albumin: 3.2 g/dL — ABNORMAL LOW (ref 3.5–5.2)
Anion gap: 7 (ref 5–15)
BILIRUBIN TOTAL: 0.7 mg/dL (ref 0.3–1.2)
BUN: 16 mg/dL (ref 6–23)
CALCIUM: 8.7 mg/dL (ref 8.4–10.5)
CHLORIDE: 110 mmol/L (ref 96–112)
CO2: 24 mmol/L (ref 19–32)
CREATININE: 0.77 mg/dL (ref 0.50–1.10)
GFR calc Af Amer: >90 mL/min (ref >90)
GFR calc non Af Amer: 86 mL/min — ABNORMAL LOW (ref >90)
GLUCOSE: 133 mg/dL — AB (ref 70–99)
Potassium: 3.6 mmol/L (ref 3.5–5.1)
SODIUM: 141 mmol/L (ref 135–145)
TOTAL PROTEIN: 6.1 g/dL (ref 6.0–8.3)

## 2014-09-06 LAB — LIPID PANEL
CHOL/HDL RATIO: 3.6 ratio
Cholesterol: 145 mg/dL (ref 0–200)
HDL: 40 mg/dL (ref >39)
LDL Cholesterol: 90 mg/dL (ref 0–99)
TRIGLYCERIDES: 75 mg/dL (ref <150)
VLDL: 15 mg/dL (ref 0–40)

## 2014-09-06 LAB — CBC
HCT: 45.1 % (ref 36.0–46.0)
Hemoglobin: 15 g/dL (ref 12.0–15.0)
MCH: 28.8 pg (ref 26.0–34.0)
MCHC: 33.3 g/dL (ref 30.0–36.0)
MCV: 86.7 fL (ref 78.0–100.0)
PLATELETS: 195 10*3/uL (ref 150–400)
RBC: 5.2 MIL/uL — ABNORMAL HIGH (ref 3.87–5.11)
RDW: 15.2 % (ref 11.5–15.5)
WBC: 8 10*3/uL (ref 4.0–10.5)

## 2014-09-06 LAB — TROPONIN I
Troponin I: 0.08 ng/mL — ABNORMAL HIGH (ref <0.031)
Troponin I: 0.1 ng/mL — ABNORMAL HIGH (ref <0.031)
Troponin I: 0.11 ng/mL — ABNORMAL HIGH (ref <0.031)

## 2014-09-06 LAB — TSH: TSH: 1.94 u[IU]/mL (ref 0.350–4.500)

## 2014-09-06 MED ORDER — FUROSEMIDE 10 MG/ML IJ SOLN
40.0000 mg | Freq: Two times a day (BID) | INTRAMUSCULAR | Status: DC
  Administered 2014-09-06: 40 mg via INTRAVENOUS
  Filled 2014-09-06 (×3): qty 4

## 2014-09-06 MED ORDER — IRBESARTAN 150 MG PO TABS
150.0000 mg | ORAL_TABLET | Freq: Every day | ORAL | Status: DC
  Administered 2014-09-06 – 2014-09-09 (×3): 150 mg via ORAL
  Filled 2014-09-06 (×4): qty 1

## 2014-09-06 NOTE — Consult Note (Signed)
Referring Physician: Pearson Grippe, MD Reason for Consultation:  Hypertensive emergency, NSTEMI, HF   HPI: Sheri Dunn is a 29F with HTN and HL who presented to the ED with hypertensive emergency and NSTEMI.  She reports SOB x 6 days.  Patient denies chest pain, palpitations, orthopnea, PND, LE edema or weight gain but has noted a non-productive cough.  She denies fever or chills.  Over the last 2 days her SOB has been increasing so she decided to come to the ED for evaluation.Of note, she stopped taking her antihypertensive medication (losartan) 1 month ago because it was affecting her vision.  She saw her PCP 1-2 weeks ago, at which time the dose was cut in half.  However, she still had not restarted the losartan.    In the ED Sheri Dunn's BP was 170/117 with a HR 67.  Troponin I was positive to 0.08 and BNP was 4343.  She received lasix  IV, ASA and lovenox .  She reports good UOP with lasix and no longer has a cough.   Review of Systems: As per HPI  Past Medical History  Diagnosis Date  . Hypertension   . High cholesterol     Medications Prior to Admission  Medication Sig Dispense Refill  . Cholecalciferol (VITAMIN D3) 2000 UNITS TABS Take 2,000 Units by mouth daily.    . valsartan (DIOVAN) 80 MG tablet Take 80 mg by mouth daily.  0  . HYDROcodone-acetaminophen (NORCO/VICODIN) 5-325 MG per tablet Take 1 tablet by mouth every 6 (six) hours as needed for pain. 20 tablet 0     . aspirin EC  325 mg Oral Daily  . atorvastatin  80 mg Oral q1800  . furosemide  20 mg Intravenous BID  . irbesartan  75 mg Oral Daily  . nitroGLYCERIN  1 inch Topical 3 times per day  . sodium chloride  3 mL Intravenous Q12H  . sodium chloride  3 mL Intravenous Q12H    Infusions:    No Known Allergies  History   Social History  . Marital Status: Single    Spouse Name: N/A  . Number of Children: N/A  . Years of Education: N/A   Occupational History  . Not on file.   Social History  Main Topics  . Smoking status: Never Smoker   . Smokeless tobacco: Not on file  . Alcohol Use: No  . Drug Use: No  . Sexual Activity: No   Other Topics Concern  . Not on file   Social History Narrative    Family History  Problem Relation Age of Onset  . Heart attack Mother     PHYSICAL EXAM: Filed Vitals:   09/05/14 2322  BP: 169/78  Pulse: 63  Temp: 97.5 F (36.4 C)  Resp: 20     Intake/Output Summary (Last 24 hours) at 09/06/14 0340 Last data filed at 09/06/14 0215  Gross per 24 hour  Intake    240 ml  Output    750 ml  Net   -510 ml    General:  Well appearing. No respiratory difficulty.  Laying flat in bed. HEENT: normal Neck: supple. JVP to mid neck at 45 degrees. +HJR.   Carotids 2+ bilat; no bruits. No lymphadenopathy or thryomegaly appreciated. Cor: PMI nondisplaced. Regular rate & rhythm. No rubs or murmurs. +S4 Lungs: clear bilaterally Abdomen: soft, nontender, nondistended. No hepatosplenomegaly. No bruits or masses. Good bowel sounds. Extremities: no cyanosis, clubbing, rash, edema Neuro: alert & oriented  x 3, cranial nerves grossly intact. moves all 4 extremities w/o difficulty. Affect pleasant.  ECG: Sinus at 92bpm.  R atrial enlargement.  Diffuse TWI 2/2 ischemia vs LVH with repolarization abnormality.  ?delta in lead I.   Results for orders placed or performed during the hospital encounter of 09/05/14 (from the past 24 hour(s))  CBC     Status: None   Collection Time: 09/05/14  3:11 PM  Result Value Ref Range   WBC 7.5 4.0 - 10.5 K/uL   RBC 4.80 3.87 - 5.11 MIL/uL   Hemoglobin 13.7 12.0 - 15.0 g/dL   HCT 04.542.3 40.936.0 - 81.146.0 %   MCV 88.1 78.0 - 100.0 fL   MCH 28.5 26.0 - 34.0 pg   MCHC 32.4 30.0 - 36.0 g/dL   RDW 91.415.3 78.211.5 - 95.615.5 %   Platelets 180 150 - 400 K/uL  Basic metabolic panel     Status: Abnormal   Collection Time: 09/05/14  3:11 PM  Result Value Ref Range   Sodium 139 135 - 145 mmol/L   Potassium 4.2 3.5 - 5.1 mmol/L   Chloride  110 96 - 112 mmol/L   CO2 17 (L) 19 - 32 mmol/L   Glucose, Bld 113 (H) 70 - 99 mg/dL   BUN 24 (H) 6 - 23 mg/dL   Creatinine, Ser 2.130.83 0.50 - 1.10 mg/dL   Calcium 9.1 8.4 - 08.610.5 mg/dL   GFR calc non Af Amer 72 (L) >90 mL/min   GFR calc Af Amer 84 (L) >90 mL/min   Anion gap 12 5 - 15  BNP (order ONLY if patient complains of dyspnea/SOB AND you have documented it for THIS visit)     Status: Abnormal   Collection Time: 09/05/14  3:11 PM  Result Value Ref Range   B Natriuretic Peptide 4342.6 (H) 0.0 - 100.0 pg/mL  I[facility]stat troponin, [facility])     Status: None   Collection Time: 09/05/14  3:26 PM  Result Value Ref Range   Troponin i, poc 0.05 0.00 - 0.08 ng/mL   Comment 3          Troponin I     Status: Abnormal   Collection Time: 09/05/14  5:41 PM  Result Value Ref Range   Troponin I 0.08 (H) <0.031 ng/mL  D-dimer, quantitative     Status: Abnormal   Collection Time: 09/05/14  5:41 PM  Result Value Ref Range   D-Dimer, Quant 1.41 (H) 0.00 - 0.48 ug/mL-FEU  Troponin I (q 6hr x 3)     Status: Abnormal   Collection Time: 09/05/14 11:50 PM  Result Value Ref Range   Troponin I 0.08 (H) <0.031 ng/mL  CBC     Status: Abnormal   Collection Time: 09/05/14 11:50 PM  Result Value Ref Range   WBC 8.0 4.0 - 10.5 K/uL   RBC 5.20 (H) 3.87 - 5.11 MIL/uL   Hemoglobin 15.0 12.0 - 15.0 g/dL   HCT 57.845.1 46.936.0 - 62.946.0 %   MCV 86.7 78.0 - 100.0 fL   MCH 28.8 26.0 - 34.0 pg   MCHC 33.3 30.0 - 36.0 g/dL   RDW 52.815.2 41.311.5 - 24.415.5 %   Platelets 195 150 - 400 K/uL  TSH     Status: None   Collection Time: 09/05/14 11:50 PM  Result Value Ref Range   TSH 1.940 0.350 - 4.500 uIU/mL   Dg Chest 2 View  09/05/2014   CLINICAL DATA:  Dry cough and shortness  of breath for 5 days  EXAM: CHEST  2 VIEW  COMPARISON:  03/09/2013  FINDINGS: Cardiac shadow is enlarged but stable. The lungs are hyperinflated. Mild interstitial changes are seen although no focal infiltrate is noted. No bony abnormality is seen.   IMPRESSION: COPD without acute abnormality   Electronically Signed   By: Alcide Clever M.D.   On: 09/05/2014 16:01   Ct Angio Chest Pe W/cm &/or Wo Cm  09/05/2014   CLINICAL DATA:  Dyspnea.  Chest soreness.  Shortness of breath.  EXAM: CT ANGIOGRAPHY CHEST WITH CONTRAST  TECHNIQUE: Multidetector CT imaging of the chest was performed using the standard protocol during bolus administration of intravenous contrast. Multiplanar CT image reconstructions and MIPs were obtained to evaluate the vascular anatomy.  CONTRAST:  OMNIPAQUE IOHEXOL 350 MG/ML SOLN  COMPARISON:  03/09/2013  FINDINGS: Despite efforts by the technologist and patient, motion artifact is present on today's exam and could not be eliminated. This reduces exam sensitivity and specificity.  Mediastinum/Nodes: No filling defect is identified in the pulmonary arterial tree to suggest pulmonary embolus.  Imaging is performed prior to aortic opacification and accordingly today' s exam is not sensitive in assessing the aorta 4 certain vascular conditions such as dissection.  Borderline prominent AP window lymph nodes. There is moderate to prominent cardiomegaly primarily involving the left heart. Coronary atherosclerosis. Large main pulmonary artery at 4.1 cm ; ascending aorta at 3.9 cm.  Lungs/Pleura: Secondary pulmonary lobular interstitial thickening is present in the lungs although is mild. There is bibasilar atelectasis along with bilateral airway thickening. Small right and trace left pleural effusions  Upper abdomen: Unremarkable  Musculoskeletal: Unremarkable  Review of the MIP images confirms the above findings.  IMPRESSION: 1. No filling defect is identified in the pulmonary arterial tree to suggest pulmonary embolus. 2. Moderate to prominent cardiomegaly especially involving the left heart, with coronary atherosclerosis, and also interstitial accentuation which may reflect interstitial pulmonary edema. 3. Small right and trace left pleural  effusions. Mild atelectasis in both lower lobes. 4. Airway thickening may reflect mild interstitial edema or bronchitis. 5. Prominent main pulmonary artery could reflect pulmonary arterial hypertension.   Electronically Signed   By: Gaylyn Rong M.D.   On: 09/05/2014 21:41     ASSESSMENT: 76F with HTN and HL who presented to the ED with hypertensive emergency and NSTEMI. NSTEMI is likely secondary to poorly controlled hypertension rather than ACS.  BP is poorly controlled 2/2 noncompliance.  I discussed the importance of taking her medications and she expressed understanding.  PLAN/DISCUSSION:  # Hypertensive emergency: Patient not taking her home meds.  Would start a cardioprotective regimen given that there may be some underlying coronary disease. - carvedilol 12.5mg  q12h - If no coronary disease, can switch to a thiazide + CCB  # NSTEMI: Likely demand ischemia as above. - ASA  daily - Atorvastatin  daily - Carvedilol as above - Continue lovenox - Likely stress prior to discharge vs as an outpatient  # HF: Due to poorly controlled HTN.  Patient remains volume overloaded, though she has improved clinically. - Lasix  IV bid - Strict I/Os - 2L fluid restriction - TTE to assess LV function - Carvedilol as above.   - Add ACE-I/ARB if LV function is impaired  Thank you for this consult.  We will continue to follow.

## 2014-09-06 NOTE — Progress Notes (Signed)
Admitted pt to rm 3E12 from ED, pt alert and oriented denied pain at this time, oriented to room, call bell placed within reach, admission assessment done, orders carried out.   09/05/14 2322  Vitals  Temp 97.5 F (36.4 C)  Temp Source Oral  BP (!) 169/78 mmHg  BP Method Automatic  Patient Position (if appropriate) Sitting  Pulse Rate 63  Resp 20  Oxygen Therapy  SpO2 100 %  Height and Weight  Height 5\' 6"  (1.676 m)  Weight 62.596 kg (138 lb) (Scale B )  Type of Scale Used Standing  BSA (Calculated - sq m) 1.71 sq meters  BMI (Calculated) 22.3  Weight in (lb) to have BMI = 25 154.6

## 2014-09-06 NOTE — Progress Notes (Signed)
[facility] PROGRESS NOTE Interim History: Sheri Dunn who comes into the emergency room for hypertensive emergency and elevated cardiac troponins. She relates shortness of breath for the past 6 days.   Assessment/Plan:  Hypertensive emergency - Due to noncompliance of her medications. - She was started back on her home medication and IV Lasix and her blood pressure has slowly trended down.  Elevated troponin: - Likely due to demand ischemia, cardiology was consulted 2-D echo is pending.  Acute Congestive heart failure - She seems to be volume overload, will go ahead and increase her Lasix. - Increase her Avapro. - 2-D echo is pending, continue strict I's and O's daily weights and restrict her fluids diet. - Check previous 2-D echo on 03/10/2013 that showed an EF of 60% no diastolic abnormalities.    Code Status: full Family Communication: none  Disposition Plan: inpatient   Consultants:  Cardiology  Procedures:  Chest x-ray  2-D echo  Antibiotics:  none  HPI/Subjective: She relates her shortness of breath is better. She cannot lay flat to sleep. As she develop PND.  Objective: Filed Vitals:   09/05/14 2306 09/05/14 2307 09/05/14 2322 09/06/14 0422  BP: 158/75 170/90 169/78 145/79  Pulse:   63 88  Temp:   97.5 F (36.4 C) 98.5 F (36.9 C)  TempSrc:   Oral Oral  Resp:   20 18  Height:   5\' 6"  (1.676 m)   Weight:   62.596 kg (138 lb) 62.642 kg (138 lb 1.6 oz)  SpO2:   100% 100%    Intake/Output Summary (Last 24 hours) at 09/06/14 0838 Last data filed at 09/06/14 0630  Gross per 24 hour  Intake    240 ml  Output    750 ml  Net   -510 ml   Filed Weights   09/05/14 2322 09/06/14 0422  Weight: 62.596 kg (138 lb) 62.642 kg (138 lb 1.6 oz)    Exam:  General: Alert, awake, oriented x3, in no acute distress.  HEENT: No bruits, no goiter. Positive JVD Heart: Regular rate and rhythm. Lungs: Good air movement, clear to  auscultation. Abdomen: Soft, nontender, nondistended, positive bowel sounds.  Neuro: Grossly intact, nonfocal.   Data Reviewed: Basic Metabolic Panel:  Recent Labs Lab 09/05/14 1511 09/06/14 0515  NA 139 141  K 4.2 3.6  CL 110 110  CO2 17* 24  GLUCOSE 113* 133*  BUN 24* 16  CREATININE 0.83 0.77  CALCIUM 9.1 8.7   Liver Function Tests:  Recent Labs Lab 09/06/14 0515  AST 84*  ALT 192*  ALKPHOS 111  BILITOT 0.7  PROT 6.1  ALBUMIN 3.2*   No results for input(s): LIPASE, AMYLASE in the last 168 hours. No results for input(s): AMMONIA in the last 168 hours. CBC:  Recent Labs Lab 09/05/14 1511 09/05/14 2350  WBC 7.5 8.0  HGB 13.7 15.0  HCT 42.3 45.1  MCV 88.1 86.7  PLT 180 195   Cardiac Enzymes:  Recent Labs Lab 09/05/14 1741 09/05/14 2350 09/06/14 0515  TROPONINI 0.08* 0.08* 0.10*   BNP (last 3 results)  Recent Labs  09/05/14 1511  BNP 4342.6*    ProBNP (last 3 results) No results for input(s): PROBNP in the last 8760 hours.  CBG: No results for input(s): GLUCAP in the last 168 hours.  No results found for this or any previous visit (from the past 240 hour(s)).   Studies: Dg Chest 2 View  09/05/2014   CLINICAL DATA:  Dry cough and  shortness of breath for 5 days  EXAM: CHEST  2 VIEW  COMPARISON:  03/09/2013  FINDINGS: Cardiac shadow is enlarged but stable. The lungs are hyperinflated. Mild interstitial changes are seen although no focal infiltrate is noted. No bony abnormality is seen.  IMPRESSION: COPD without acute abnormality   Electronically Signed   By: Alcide Clever M.D.   On: 09/05/2014 16:01   Ct Angio Chest Pe W/cm &/or Wo Cm  09/05/2014   CLINICAL DATA:  Dyspnea.  Chest soreness.  Shortness of breath.  EXAM: CT ANGIOGRAPHY CHEST WITH CONTRAST  TECHNIQUE: Multidetector CT imaging of the chest was performed using the standard protocol during bolus administration of intravenous contrast. Multiplanar CT image reconstructions and MIPs were  obtained to evaluate the vascular anatomy.  CONTRAST:  OMNIPAQUE IOHEXOL 350 MG/ML SOLN  COMPARISON:  03/09/2013  FINDINGS: Despite efforts by the technologist and patient, motion artifact is present on today's exam and could not be eliminated. This reduces exam sensitivity and specificity.  Mediastinum/Nodes: No filling defect is identified in the pulmonary arterial tree to suggest pulmonary embolus.  Imaging is performed prior to aortic opacification and accordingly today' s exam is not sensitive in assessing the aorta 4 certain vascular conditions such as dissection.  Borderline prominent AP window lymph nodes. There is moderate to prominent cardiomegaly primarily involving the left heart. Coronary atherosclerosis. Large main pulmonary artery at 4.1 cm ; ascending aorta at 3.9 cm.  Lungs/Pleura: Secondary pulmonary lobular interstitial thickening is present in the lungs although is mild. There is bibasilar atelectasis along with bilateral airway thickening. Small right and trace left pleural effusions  Upper abdomen: Unremarkable  Musculoskeletal: Unremarkable  Review of the MIP images confirms the above findings.  IMPRESSION: 1. No filling defect is identified in the pulmonary arterial tree to suggest pulmonary embolus. 2. Moderate to prominent cardiomegaly especially involving the left heart, with coronary atherosclerosis, and also interstitial accentuation which may reflect interstitial pulmonary edema. 3. Small right and trace left pleural effusions. Mild atelectasis in both lower lobes. 4. Airway thickening may reflect mild interstitial edema or bronchitis. 5. Prominent main pulmonary artery could reflect pulmonary arterial hypertension.   Electronically Signed   By: Gaylyn Rong M.D.   On: 09/05/2014 21:41    Scheduled Meds: . aspirin EC  325 mg Oral Daily  . atorvastatin  80 mg Oral q1800  . furosemide  20 mg Intravenous BID  . irbesartan  75 mg Oral Daily  . nitroGLYCERIN  1 inch  Topical 3 times per day  . sodium chloride  3 mL Intravenous Q12H  . sodium chloride  3 mL Intravenous Q12H   Continuous Infusions:    Marinda Elk  [facility] Pager [phone]. If 7PM-7AM, please contact night-coverage at [email or web address], password [facility] 09/06/2014, 8:38 AM  LOS: 1 day

## 2014-09-07 ENCOUNTER — Telehealth: Payer: Self-pay | Admitting: Nurse Practitioner

## 2014-09-07 DIAGNOSIS — R945 Abnormal results of liver function studies: Secondary | ICD-10-CM | POA: Diagnosis present

## 2014-09-07 DIAGNOSIS — I5021 Acute systolic (congestive) heart failure: Secondary | ICD-10-CM | POA: Diagnosis present

## 2014-09-07 DIAGNOSIS — I214 Non-ST elevation (NSTEMI) myocardial infarction: Secondary | ICD-10-CM | POA: Diagnosis present

## 2014-09-07 DIAGNOSIS — R0600 Dyspnea, unspecified: Secondary | ICD-10-CM

## 2014-09-07 DIAGNOSIS — R7989 Other specified abnormal findings of blood chemistry: Secondary | ICD-10-CM | POA: Diagnosis present

## 2014-09-07 DIAGNOSIS — R9431 Abnormal electrocardiogram [ECG] [EKG]: Secondary | ICD-10-CM | POA: Diagnosis present

## 2014-09-07 DIAGNOSIS — E785 Hyperlipidemia, unspecified: Secondary | ICD-10-CM

## 2014-09-07 DIAGNOSIS — I10 Essential (primary) hypertension: Secondary | ICD-10-CM | POA: Diagnosis present

## 2014-09-07 DIAGNOSIS — I4581 Long QT syndrome: Secondary | ICD-10-CM

## 2014-09-07 LAB — MAGNESIUM: Magnesium: 2.1 mg/dL (ref 1.5–2.5)

## 2014-09-07 MED ORDER — POTASSIUM CHLORIDE CRYS ER 20 MEQ PO TBCR: 20.0000 meq | EXTENDED_RELEASE_TABLET | Freq: Once | ORAL | Status: DC

## 2014-09-07 MED ORDER — FUROSEMIDE 20 MG PO TABS
20.0000 mg | ORAL_TABLET | Freq: Every day | ORAL | Status: DC
Start: 2014-09-07 — End: 2014-09-07
  Administered 2014-09-07: 20 mg via ORAL
  Filled 2014-09-07: qty 1

## 2014-09-07 MED ORDER — ASPIRIN EC 325 MG PO TBEC: 325.0000 mg | DELAYED_RELEASE_TABLET | Freq: Every day | ORAL | Status: DC

## 2014-09-07 MED ORDER — ASPIRIN 81 MG PO CHEW
81.0000 mg | CHEWABLE_TABLET | ORAL | Status: AC
  Administered 2014-09-08: 81 mg via ORAL
  Filled 2014-09-07: qty 1

## 2014-09-07 MED ORDER — POTASSIUM CHLORIDE CRYS ER 20 MEQ PO TBCR
40.0000 meq | EXTENDED_RELEASE_TABLET | Freq: Once | ORAL | Status: AC
  Administered 2014-09-07: 40 meq via ORAL
  Filled 2014-09-07: qty 2

## 2014-09-07 MED ORDER — SODIUM CHLORIDE 0.9 % IV SOLN
1.0000 mL/kg/h | INTRAVENOUS | Status: DC
  Administered 2014-09-08: 1 mL/kg/h via INTRAVENOUS

## 2014-09-07 NOTE — Progress Notes (Signed)
Patient: Sheri Dunn / Admit Date: 09/05/2014 / Date of Encounter: 09/07/2014, 10:17 AM   Subjective: Feeling much better. Back to baseline. No recent CP. Breathing improved. No orthopnea. Does not eat a lot of salt.  Objective: Telemetry: NSR occasional PVCs Physical Exam: Blood pressure 141/78, pulse 83, temperature 98.2 F (36.8 C), temperature source Oral, resp. rate 20, height  (1.676 m), weight 134 lb 1.6 oz (60.827 kg), SpO2 95 %. General: Well developed, well nourished AAF, in no acute distress. Head: Normocephalic, atraumatic, sclera non-icteric, no xanthomas, nares are without discharge. Neck: Negative for carotid bruits. JVP not elevated. Lungs: Clear bilaterally to auscultation without wheezes, rales, or rhonchi. Breathing is unlabored. Heart: RRR S1 S2, + S4 versus split S2 without murmurs, rubs. Abdomen: Soft, non-tender, non-distended with normoactive bowel sounds. No rebound/guarding. Extremities: No clubbing or cyanosis. No edema. Distal pedal pulses are 2+ and equal bilaterally. Neuro: Alert and oriented X 3. Moves all extremities spontaneously. Psych:  Responds to questions appropriately with a normal affect.   Intake/Output Summary (Last 24 hours) at 09/07/14 1017 Last data filed at 09/07/14 0909  Gross per 24 hour  Intake   1132 ml  Output   2750 ml  Net  -1618 ml    Inpatient Medications:  . aspirin EC  325 mg Oral Daily  . atorvastatin  80 mg Oral q1800  . furosemide  40 mg Intravenous BID  . irbesartan  150 mg Oral Daily  . nitroGLYCERIN  1 inch Topical 3 times per day  . sodium chloride  3 mL Intravenous Q12H  . sodium chloride  3 mL Intravenous Q12H   Infusions:    Labs:  Recent Labs  09/05/14 1511 09/06/14 0515  NA 139 141  K 4.2 3.6  CL 110 110  CO2 17* 24  GLUCOSE 113* 133*  BUN 24* 16  CREATININE 0.83 0.77  CALCIUM 9.1 8.7    Recent Labs  09/06/14 0515  AST 84*  ALT 192*  ALKPHOS 111  BILITOT 0.7  PROT 6.1  ALBUMIN  3.2*    Recent Labs  09/05/14 1511 09/05/14 2350  WBC 7.5 8.0  HGB 13.7 15.0  HCT 42.3 45.1  MCV 88.1 86.7  PLT 180 195    Recent Labs  09/05/14 1741 09/05/14 2350 09/06/14 0515 09/06/14 1200  TROPONINI 0.08* 0.08* 0.10* 0.11*   Invalid input(s): POCBNP No results for input(s): HGBA1C in the last 72 hours.   Radiology/Studies:  Dg Chest 2 View  09/05/2014   CLINICAL DATA:  Dry cough and shortness of breath for 5 days  EXAM: CHEST  2 VIEW  COMPARISON:  03/09/2013  FINDINGS: Cardiac shadow is enlarged but stable. The lungs are hyperinflated. Mild interstitial changes are seen although no focal infiltrate is noted. No bony abnormality is seen.  IMPRESSION: COPD without acute abnormality   Electronically Signed   By: Alcide Clever M.D.   On: 09/05/2014 16:01   Ct Angio Chest Pe W/cm &/or Wo Cm  09/05/2014   CLINICAL DATA:  Dyspnea.  Chest soreness.  Shortness of breath.  EXAM: CT ANGIOGRAPHY CHEST WITH CONTRAST  TECHNIQUE: Multidetector CT imaging of the chest was performed using the standard protocol during bolus administration of intravenous contrast. Multiplanar CT image reconstructions and MIPs were obtained to evaluate the vascular anatomy.  CONTRAST:  OMNIPAQUE IOHEXOL 350 MG/ML SOLN  COMPARISON:  03/09/2013  FINDINGS: Despite efforts by the technologist and patient, motion artifact is present on today's exam and could not  be eliminated. This reduces exam sensitivity and specificity.  Mediastinum/Nodes: No filling defect is identified in the pulmonary arterial tree to suggest pulmonary embolus.  Imaging is performed prior to aortic opacification and accordingly today' s exam is not sensitive in assessing the aorta 4 certain vascular conditions such as dissection.  Borderline prominent AP window lymph nodes. There is moderate to prominent cardiomegaly primarily involving the left heart. Coronary atherosclerosis. Large main pulmonary artery at 4.1 cm ; ascending aorta at 3.9 cm.   Lungs/Pleura: Secondary pulmonary lobular interstitial thickening is present in the lungs although is mild. There is bibasilar atelectasis along with bilateral airway thickening. Small right and trace left pleural effusions  Upper abdomen: Unremarkable  Musculoskeletal: Unremarkable  Review of the MIP images confirms the above findings.  IMPRESSION: 1. No filling defect is identified in the pulmonary arterial tree to suggest pulmonary embolus. 2. Moderate to prominent cardiomegaly especially involving the left heart, with coronary atherosclerosis, and also interstitial accentuation which may reflect interstitial pulmonary edema. 3. Small right and trace left pleural effusions. Mild atelectasis in both lower lobes. 4. Airway thickening may reflect mild interstitial edema or bronchitis. 5. Prominent main pulmonary artery could reflect pulmonary arterial hypertension.   Electronically Signed   By: Gaylyn Rong M.D.   On: 09/05/2014 21:41     Assessment and Plan  64 y/o F with HTN, HLD, recent reduction in antiHTN meds presented 4/3 with hypertensive urgency, CHF, and elevated troponin.  1. Accelerated hypertension  - BP improved with topical nitrate, IV diuresis, and resumption of ARB - will d/c NTG paste - change IV Lasix to oral  daily - based on echo report would consider titration of ARB versus addition of BB as BP may creep up in absence of NTG paste  2. Probable acute diastolic CHF on admission - 2D echo pending for LV function assessment - with  total IV Lasix she feels back to baseline - will change to  PO daily - BP control, salt restriction reinforced  3. Elevated troponin - unclear if this represents NSTEMI or demand ischemia - CTA showed no PE but did note prominent cardiomegaly with coronary atherosclerosis - will discuss further evaluation with MD  4. QT prolongation - initial tracing with QTc 534 in setting of acute HF - will recheck EKG this AM  5.  Transaminitis - given abnormal LFTs, will hold any further statin for now (LDL 90) - decision regarding statin dose at discharge will be dependent on potential diagnosis of CAD and LFT trajectory - further eval per IM - could be congestive hepatopathy but no prior levels for baseline  Signed, Dayna Dunn PA-C  Addendum: EKG reviewed. Still shows QTc prolongation at . QRS 84ms. Also note diffuse TWI inferiorly and V2-V6 which are all new this admission from prior in 2014. (QTC 495 on prior EKG). Will give KCl x 1 now and check Mg. Not currently on any QT prolonging medications. Will discuss further evaluation with MD. Ronie Spies PA-C  I have seen and examined the patient along with Ronie Spies PA-C.  I have reviewed the chart, notes and new data.  I agree with PA/NP's note.  Key new complaints: never had angina; dyspnea and cough resolved with diuresis Key examination changes: S4 gallop, no rales or JVD or edema Key new findings / data: echo shows severely depressed LVEF, diffuse hypokinesis, maybe worse in LAD distribution; markedly abnormal ECG consistent with ischemia  PLAN: Coronary angio graphy in parallel with medical  Rx for CHF.  Thurmon FairMihai Tyshawna Alarid, MD, [facility] HospitalFACC [facility] [phone] 09/07/2014, 12:57 PM

## 2014-09-07 NOTE — Telephone Encounter (Signed)
Sheri Dunn called back--this pt not being d/c yet--cxl'd appt

## 2014-09-07 NOTE — Care Management Note (Unsigned)
    Page 1 of 1   09/07/2014     3:44:10 PM CARE MANAGEMENT NOTE 09/07/2014  Patient:  Sheri Dunn,Sheri Dunn   Account Number:  [id number]  Date Initiated:  09/07/2014  Documentation initiated by:  Jaxxson Cavanah  Subjective/Objective Assessment:   Pt adm on 09/05/14 with HTN emergency, CHF.  PTA, pt independent, lives alone.     Action/Plan:   Will follow for dc needs as pt progresses.   Anticipated DC Date:  09/08/2014   Anticipated DC Plan:  HOME/SELF CARE      DC Planning Services  CM consult      Choice offered to / List presented to:             Status of service:  In process, will continue to follow Medicare Important Message given?   (If response is "NO", the following Medicare IM given date fields will be blank) Date Medicare IM given:   Medicare IM given by:   Date Additional Medicare IM given:   Additional Medicare IM given by:    Discharge Disposition:    Per UR Regulation:  Reviewed for med. necessity/level of care/duration of stay  If discussed at Long Length of Stay Meetings, dates discussed:    Comments:

## 2014-09-07 NOTE — Progress Notes (Signed)
  Echocardiogram 2D Echocardiogram has been performed.  Keydi Giel 09/07/2014, 10:15 AM

## 2014-09-07 NOTE — Telephone Encounter (Signed)
New message      TCM appt on 09-15-14 with Sheri Dunn

## 2014-09-07 NOTE — Progress Notes (Addendum)
Risks and benefits of cardiac catheterization have been discussed with the patient. These include bleeding, infection, kidney damage, stroke, heart attack, death. The patient understands these risks and is willing to proceed. Scheduled for 9am tomorrow. Per d/w MD will gently hydrate in AM per usual protocol. Hold Lasix tomorrow. Yesena Reaves PA-C

## 2014-09-07 NOTE — Progress Notes (Signed)
[facility] PROGRESS NOTE  Sheri Dunn ZOX:[id number] DOB: [birth year] DOA: 09/05/2014 PCP: follows with PA of Dr Renae Gloss  Brief narrative 64 year old female with no past medical history of presented with dyspnea on exertion for past 6 days and weakness with finding of hypertensive emergency and elevated troponins. An found to have acute CHF on exam with 2-D echo showing markedly low EF of 25-30% with diffuse hypokinesis.   Assessment/Plan: NSTEMI EKG noted for diffuse T-wave inversions and prolonged QTC ( 518 this am). Troponin peaked  at 0.11 with 2-D echo showing elevated is used EF of 25-30% with diffuse hypokinesis and grade 1 diastolic dysfunction. Admitted for low-dose aspirin and ARB. Diuresed with Lasix on admission and currently euvolemic. Plan on cardiac cath in a.m.  Acute systolic CHF / ? ischemic cardiomyopathy Euvolemic after diuresed with 1 dose IV Lasix. Plan on cardiac cath tomorrow morning.  Qt prolongation . Monitor K and mag  Accelerated hypertension Elevated to acute CHF and NSTEMI. Improved  dvt  Prophylaxis:sq lovenox  Diet: Heart healthy,NPO after midnight  Code Status: full code Family Communication: none Disposition Plan: pending cath results tomorrow   Consultants:  cardiology  Procedures:  2d echo on 4/4  Cath on 4/5  Antibiotics:  none  HPI/Subjective: Patient seen and examined. Reports occasional shortness of breath more with exertion  Objective: Filed Vitals:   09/07/14 1430  BP: 141/72  Pulse: 88  Temp: 98 F (36.7 C)  Resp: 18    Intake/Output Summary (Last 24 hours) at 09/07/14 1732 Last data filed at 09/07/14 1431  Gross per 24 hour  Intake   1214 ml  Output   2850 ml  Net  -1636 ml   Filed Weights   09/05/14 2322 09/06/14 0422 09/07/14 0625  Weight: 62.596 kg (138 lb) 62.642 kg (138 lb 1.6 oz) 60.827 kg (134 lb 1.6 oz)    Exam:   General:  Elderly thin built female in no acute distress  No pallor,  moist oral mucosa, no JVD  Chest: Clear to auscultation bilaterally, no added sounds  CVS: Normal S1 and S2, no murmurs rub or gallop  GI: Soft, nondistended, nontender bowel sounds present  Musculoskeletal: Warm, no edema  Data Reviewed: Basic Metabolic Panel:  Recent Labs Lab 09/05/14 1511 09/06/14 0515 09/07/14 1217  NA 139 141  --   K 4.2 3.6  --   CL 110 110  --   CO2 17* 24  --   GLUCOSE 113* 133*  --   BUN 24* 16  --   CREATININE 0.83 0.77  --   CALCIUM 9.1 8.7  --   MG  --   --  2.1   Liver Function Tests:  Recent Labs Lab 09/06/14 0515  AST 84*  ALT 192*  ALKPHOS 111  BILITOT 0.7  PROT 6.1  ALBUMIN 3.2*   No results for input(s): LIPASE, AMYLASE in the last 168 hours. No results for input(s): AMMONIA in the last 168 hours. CBC:  Recent Labs Lab 09/05/14 1511 09/05/14 2350  WBC 7.5 8.0  HGB 13.7 15.0  HCT 42.3 45.1  MCV 88.1 86.7  PLT 180 195   Cardiac Enzymes:  Recent Labs Lab 09/05/14 1741 09/05/14 2350 09/06/14 0515 09/06/14 1200  TROPONINI 0.08* 0.08* 0.10* 0.11*   BNP (last 3 results)  Recent Labs  09/05/14 1511  BNP 4342.6*    ProBNP (last 3 results) No results for input(s): PROBNP in the last 8760 hours.  CBG: No results for input(s):  GLUCAP in the last 168 hours.  No results found for this or any previous visit (from the past 240 hour(s)).   Studies: Ct Angio Chest Pe W/cm &/or Wo Cm  09/05/2014   CLINICAL DATA:  Dyspnea.  Chest soreness.  Shortness of breath.  EXAM: CT ANGIOGRAPHY CHEST WITH CONTRAST  TECHNIQUE: Multidetector CT imaging of the chest was performed using the standard protocol during bolus administration of intravenous contrast. Multiplanar CT image reconstructions and MIPs were obtained to evaluate the vascular anatomy.  CONTRAST:  100mL OMNIPAQUE IOHEXOL 350 MG/ML SOLN  COMPARISON:  03/09/2013  FINDINGS: Despite efforts by the technologist and patient, motion artifact is present on today's exam and could  not be eliminated. This reduces exam sensitivity and specificity.  Mediastinum/Nodes: No filling defect is identified in the pulmonary arterial tree to suggest pulmonary embolus.  Imaging is performed prior to aortic opacification and accordingly today' s exam is not sensitive in assessing the aorta 4 certain vascular conditions such as dissection.  Borderline prominent AP window lymph nodes. There is moderate to prominent cardiomegaly primarily involving the left heart. Coronary atherosclerosis. Large main pulmonary artery at 4.1 cm ; ascending aorta at 3.9 cm.  Lungs/Pleura: Secondary pulmonary lobular interstitial thickening is present in the lungs although is mild. There is bibasilar atelectasis along with bilateral airway thickening. Small right and trace left pleural effusions  Upper abdomen: Unremarkable  Musculoskeletal: Unremarkable  Review of the MIP images confirms the above findings.  IMPRESSION: 1. No filling defect is identified in the pulmonary arterial tree to suggest pulmonary embolus. 2. Moderate to prominent cardiomegaly especially involving the left heart, with coronary atherosclerosis, and also interstitial accentuation which may reflect interstitial pulmonary edema. 3. Small right and trace left pleural effusions. Mild atelectasis in both lower lobes. 4. Airway thickening may reflect mild interstitial edema or bronchitis. 5. Prominent main pulmonary artery could reflect pulmonary arterial hypertension.   Electronically Signed   By: Gaylyn RongWalter  Liebkemann M.D.   On: 09/05/2014 21:41    Scheduled Meds: . aspirin EC  325 mg Oral Daily  . irbesartan  150 mg Oral Daily  . sodium chloride  3 mL Intravenous Q12H  . sodium chloride  3 mL Intravenous Q12H   Continuous Infusions: . [START ON 09/08/2014] sodium chloride        Time spent: 25 minutes    Eddrick Dilone  [facility] Pager [phone] If 7PM-7AM, please contact night-coverage at [email or web address], password [email or web address] 09/07/2014,  5:32 PM  LOS: 2 days

## 2014-09-08 ENCOUNTER — Encounter (HOSPITAL_COMMUNITY): Payer: Self-pay | Admitting: Interventional Cardiology

## 2014-09-08 ENCOUNTER — Encounter (HOSPITAL_COMMUNITY)
Admission: EM | Disposition: A | Discharge status: Home / Self Care | Payer: PRIVATE HEALTH INSURANCE | Source: Home / Self Care | Attending: Internal Medicine

## 2014-09-08 ENCOUNTER — Other Ambulatory Visit: Payer: Self-pay

## 2014-09-08 DIAGNOSIS — I2510 Atherosclerotic heart disease of native coronary artery without angina pectoris: Secondary | ICD-10-CM

## 2014-09-08 DIAGNOSIS — I255 Ischemic cardiomyopathy: Secondary | ICD-10-CM

## 2014-09-08 HISTORY — PX: CARDIAC CATHETERIZATION: SHX172

## 2014-09-08 HISTORY — PX: LEFT HEART CATHETERIZATION WITH CORONARY ANGIOGRAM: SHX5451

## 2014-09-08 LAB — BASIC METABOLIC PANEL
Anion gap: 5 (ref 5–15)
BUN: 18 mg/dL (ref 6–23)
CALCIUM: 9.1 mg/dL (ref 8.4–10.5)
CO2: 26 mmol/L (ref 19–32)
Chloride: 107 mmol/L (ref 96–112)
Creatinine, Ser: 0.73 mg/dL (ref 0.50–1.10)
GFR calc Af Amer: >90 mL/min (ref >90)
GFR calc non Af Amer: 88 mL/min — ABNORMAL LOW (ref >90)
GLUCOSE: 106 mg/dL — AB (ref 70–99)
Potassium: 4.8 mmol/L (ref 3.5–5.1)
Sodium: 138 mmol/L (ref 135–145)

## 2014-09-08 LAB — GLUCOSE, CAPILLARY
Glucose-Capillary: 131 mg/dL — ABNORMAL HIGH (ref 70–99)
Glucose-Capillary: 188 mg/dL — ABNORMAL HIGH (ref 70–99)
Glucose-Capillary: 93 mg/dL (ref 70–99)

## 2014-09-08 LAB — PROTIME-INR
INR: 1.14 (ref 0.00–1.49)
Prothrombin Time: 14.7 seconds (ref 11.6–15.2)

## 2014-09-08 LAB — CBC
HCT: 43.6 % (ref 36.0–46.0)
HEMOGLOBIN: 14.3 g/dL (ref 12.0–15.0)
MCH: 28.7 pg (ref 26.0–34.0)
MCHC: 32.8 g/dL (ref 30.0–36.0)
MCV: 87.6 fL (ref 78.0–100.0)
Platelets: 189 10*3/uL (ref 150–400)
RBC: 4.98 MIL/uL (ref 3.87–5.11)
RDW: 15.5 % (ref 11.5–15.5)
WBC: 8 10*3/uL (ref 4.0–10.5)

## 2014-09-08 LAB — MAGNESIUM: MAGNESIUM: 2.1 mg/dL (ref 1.5–2.5)

## 2014-09-08 LAB — POCT ACTIVATED CLOTTING TIME: Activated Clotting Time: 356 seconds

## 2014-09-08 SURGERY — LEFT HEART CATHETERIZATION WITH CORONARY ANGIOGRAM
Site: Groin | Laterality: Right

## 2014-09-08 MED ORDER — ASPIRIN 81 MG PO CHEW
81.0000 mg | CHEWABLE_TABLET | Freq: Every day | ORAL | Status: DC
  Administered 2014-09-09: 81 mg via ORAL
  Filled 2014-09-08: qty 1

## 2014-09-08 MED ORDER — HEPARIN (PORCINE) IN NACL 2-0.9 UNIT/ML-% IJ SOLN
INTRAMUSCULAR | Status: AC
  Filled 2014-09-08: qty 1500

## 2014-09-08 MED ORDER — NITROGLYCERIN 1 MG/10 ML FOR IR/CATH LAB
INTRA_ARTERIAL | Status: AC
  Filled 2014-09-08: qty 10

## 2014-09-08 MED ORDER — MORPHINE SULFATE 2 MG/ML IJ SOLN
1.0000 mg | INTRAMUSCULAR | Status: DC | PRN
  Administered 2014-09-08: 14:00:00 1 mg via INTRAVENOUS
  Filled 2014-09-08: qty 1

## 2014-09-08 MED ORDER — ONDANSETRON HCL 4 MG/2ML IJ SOLN: 4.0000 mg | Freq: Four times a day (QID) | INTRAMUSCULAR | Status: DC | PRN

## 2014-09-08 MED ORDER — TICAGRELOR 90 MG PO TABS
ORAL_TABLET | ORAL | Status: AC
  Administered 2014-09-08: 90 mg via ORAL
  Filled 2014-09-08: qty 2

## 2014-09-08 MED ORDER — SODIUM CHLORIDE 0.9 % IV SOLN: 1.0000 mL/kg/h | INTRAVENOUS | Status: AC

## 2014-09-08 MED ORDER — BIVALIRUDIN 250 MG IV SOLR
INTRAVENOUS | Status: AC
Start: 2014-09-08 — End: 2014-09-08
  Filled 2014-09-08: qty 250

## 2014-09-08 MED ORDER — SODIUM CHLORIDE 0.9 % IV SOLN
1.7500 mg/kg/h | INTRAVENOUS | Status: AC
  Filled 2014-09-08: qty 250

## 2014-09-08 MED ORDER — CARVEDILOL 3.125 MG PO TABS
3.1250 mg | ORAL_TABLET | Freq: Two times a day (BID) | ORAL | Status: DC
  Administered 2014-09-08 – 2014-09-09 (×2): 3.125 mg via ORAL
  Filled 2014-09-08 (×5): qty 1

## 2014-09-08 MED ORDER — VERAPAMIL HCL 2.5 MG/ML IV SOLN
INTRAVENOUS | Status: AC
Start: 2014-09-08 — End: 2014-09-08
  Filled 2014-09-08: qty 2

## 2014-09-08 MED ORDER — TICAGRELOR 90 MG PO TABS
90.0000 mg | ORAL_TABLET | Freq: Two times a day (BID) | ORAL | Status: DC
  Administered 2014-09-08 – 2014-09-09 (×2): 90 mg via ORAL
  Filled 2014-09-08 (×4): qty 1

## 2014-09-08 MED ORDER — FENTANYL CITRATE 0.05 MG/ML IJ SOLN
INTRAMUSCULAR | Status: AC
  Filled 2014-09-08: qty 2

## 2014-09-08 MED ORDER — LIDOCAINE HCL (PF) 1 % IJ SOLN
INTRAMUSCULAR | Status: AC
  Filled 2014-09-08: qty 30

## 2014-09-08 MED ORDER — MIDAZOLAM HCL 2 MG/2ML IJ SOLN
INTRAMUSCULAR | Status: AC
  Filled 2014-09-08: qty 2

## 2014-09-08 MED ORDER — ACETAMINOPHEN 325 MG PO TABS: 650.0000 mg | ORAL_TABLET | ORAL | Status: DC | PRN

## 2014-09-08 MED ORDER — HEPARIN SODIUM (PORCINE) 1000 UNIT/ML IJ SOLN
INTRAMUSCULAR | Status: AC
  Filled 2014-09-08: qty 1

## 2014-09-08 NOTE — Progress Notes (Signed)
Site area: right groin  Site Prior to Removal:  Level 0  Pressure Applied For 20 MINUTES    Minutes Beginning at 1450  Manual:   Yes.    Patient Status During Pull:  AAO X 4  Post Pull Groin Site:  Level 0  Post Pull Instructions Given:  Yes.    Post Pull Pulses Present:  Yes.    Dressing Applied:  Yes.    Comments:  Tolerated procedure well

## 2014-09-08 NOTE — Interval H&P Note (Signed)
Cath Lab Visit (complete for each Cath Lab visit)  Clinical Evaluation Leading to the Procedure:   ACS: Yes.    Non-ACS:    Anginal Classification: CCS IV  Anti-ischemic medical therapy: Minimal Therapy (1 class of medications)  Non-Invasive Test Results: No non-invasive testing performed  Prior CABG: No previous CABG  TIMI SCORE  Patient Information:  TIMI Score is 4  UA/NSTEMI and intermediate-risk features (e.g., TIMI score 3?4) for short-term risk of death or nonfatal MI  Revascularization of the presumed culprit artery   A (8)  Indication: 10; Score: 8     History and Physical Interval Note:  09/08/2014 9:22 AM  Sheri Dunn  has presented today for surgery, with the diagnosis of cp  The various methods of treatment have been discussed with the patient and family. After consideration of risks, benefits and other options for treatment, the patient has consented to  Procedure(s): LEFT HEART CATHETERIZATION WITH CORONARY ANGIOGRAM (N/A) as a surgical intervention .  The patient's history has been reviewed, patient examined, no change in status, stable for surgery.  I have reviewed the patient's chart and labs.  Questions were answered to the patient's satisfaction.     Sheri Dunn S.

## 2014-09-08 NOTE — H&P (View-Only) (Signed)
Patient Name: Sheri CurlsCarolyn Porath Date of Encounter: 09/08/2014  Active Problems:   Hyperlipidemia   Congestive heart failure   Hypertensive emergency   Elevated troponin   QT prolongation   Abnormal LFTs   Acute systolic CHF (congestive heart failure)   NSTEMI (non-ST elevated myocardial infarction)   Accelerated hypertension   Length of Stay: 3  SUBJECTIVE  Feels well - denies dyspnea or angina. Markedly abnormal ECG with widespread ST depression and Twave inversion, especially in anterior leads, very long QT, and abnormal echo with severe diffuse hypokinesis, LVEF 20-25% and possible regional abnormalities. Seemed to be well diuresed with low filling pressures on yesterday's echo.  CURRENT MEDS . [START ON 09/09/2014] aspirin EC  325 mg Oral Daily  . irbesartan  150 mg Oral Daily  . sodium chloride  3 mL Intravenous Q12H  . sodium chloride  3 mL Intravenous Q12H    OBJECTIVE   Intake/Output Summary (Last 24 hours) at 09/08/14 0837 Last data filed at 09/08/14 0804  Gross per 24 hour  Intake   1214 ml  Output   1400 ml  Net   -186 ml   Filed Weights   09/06/14 0422 09/07/14 0625 09/08/14 0625  Weight: 138 lb 1.6 oz (62.642 kg) 134 lb 1.6 oz (60.827 kg) 134 lb 14.4 oz (61.19 kg)    PHYSICAL EXAM Filed Vitals:   09/07/14 1430 09/07/14 2006 09/08/14 0541 09/08/14 0625  BP: 141/72 138/76 149/85   Pulse: 88 102 95   Temp: 98 F (36.7 C) 97.8 F (36.6 C) 98.1 F (36.7 C)   TempSrc: Oral Oral Oral   Resp: 18 17 18    Height:      Weight:    134 lb 14.4 oz (61.19 kg)  SpO2: 98% 98% 99%    General: Alert, oriented x3, no distress Head: no evidence of trauma, PERRL, EOMI, no exophtalmos or lid lag, no myxedema, no xanthelasma; normal ears, nose and oropharynx Neck: normal jugular venous pulsations and no hepatojugular reflux; brisk carotid pulses without delay and no carotid bruits Chest: clear to auscultation, no signs of consolidation by percussion or palpation, normal  fremitus, symmetrical and full respiratory excursions Cardiovascular: normal position and quality of the apical impulse, regular rhythm, normal first and second heart sounds, no rubs, +S4 gallop, no murmur Abdomen: no tenderness or distention, no masses by palpation, no abnormal pulsatility or arterial bruits, normal bowel sounds, no hepatosplenomegaly Extremities: no clubbing, cyanosis or edema; 2+ radial, ulnar and brachial pulses bilaterally; 2+ right femoral, posterior tibial and dorsalis pedis pulses; 2+ left femoral, posterior tibial and dorsalis pedis pulses; no subclavian or femoral bruits Neurological: grossly nonfocal  LABS  CBC  Recent Labs  09/05/14 2350 09/08/14 0458  WBC 8.0 8.0  HGB 15.0 14.3  HCT 45.1 43.6  MCV 86.7 87.6  PLT 195 189   Basic Metabolic Panel  Recent Labs  09/06/14 0515 09/07/14 1217 09/08/14 0458  NA 141  --  138  K 3.6  --  4.8  CL 110  --  107  CO2 24  --  26  GLUCOSE 133*  --  106*  BUN 16  --  18  CREATININE 0.77  --  0.73  CALCIUM 8.7  --  9.1  MG  --  2.1 2.1   Liver Function Tests  Recent Labs  09/06/14 0515  AST 84*  ALT 192*  ALKPHOS 111  BILITOT 0.7  PROT 6.1  ALBUMIN 3.2*   No results for input(s):  LIPASE, AMYLASE in the last 72 hours. Cardiac Enzymes  Recent Labs  09/05/14 2350 09/06/14 0515 09/06/14 1200  TROPONINI 0.08* 0.10* 0.11*   BNP Invalid input(s): POCBNP D-Dimer  Recent Labs  09/05/14 1741  DDIMER 1.41*   Hemoglobin A1C No results for input(s): HGBA1C in the last 72 hours. Fasting Lipid Panel  Recent Labs  09/06/14 0515  CHOL 145  HDL 40  LDLCALC 90  TRIG 75  CHOLHDL 3.6   Thyroid Function Tests  Recent Labs  09/05/14 2350  TSH 1.940    Radiology Studies Imaging results have been reviewed and No results found.    ASSESSMENT AND PLAN  Unexplained severe cardiomyopathy with diffuse LV dysfunction with possible regional variation, now with well compensated CHF by clinical  and echo criteria. Differential diagnosis includes painless ischemic cardiomyopathy and Takotsubo syndrome. Suspect background LVH due to HTN, but this is insufficient to explain her ECG changes and LV dysfunction. BP still relatively high - will add carvedilol. Marginally abnormal "plateau" elevation in troponin consistent with CHF, not acute coronary sd. Reevaluate mildly elevated LFTs in AM. Lipid profile is pretty good, statin indicated only if we identify significant CAD.  Coronary angio scheduled for this morning. This procedure has been fully reviewed with the patient and written informed consent has been obtained.    Thurmon Fair, MD, [facility] [phone] office [phone] pager 09/08/2014 8:37 AM

## 2014-09-08 NOTE — Progress Notes (Signed)
Patient Name: Sheri Dunn Date of Encounter: 09/08/2014  Active Problems:   Hyperlipidemia   Congestive heart failure   Hypertensive emergency   Elevated troponin   QT prolongation   Abnormal LFTs   Acute systolic CHF (congestive heart failure)   NSTEMI (non-ST elevated myocardial infarction)   Accelerated hypertension   Length of Stay: 3  SUBJECTIVE  Feels well - denies dyspnea or angina. Markedly abnormal ECG with widespread ST depression and Twave inversion, especially in anterior leads, very long QT, and abnormal echo with severe diffuse hypokinesis, LVEF 20-25% and possible regional abnormalities. Seemed to be well diuresed with low filling pressures on yesterday's echo.  CURRENT MEDS . [START ON 09/09/2014] aspirin EC  325 mg Oral Daily  . irbesartan  150 mg Oral Daily  . sodium chloride  3 mL Intravenous Q12H  . sodium chloride  3 mL Intravenous Q12H    OBJECTIVE   Intake/Output Summary (Last 24 hours) at 09/08/14 0837 Last data filed at 09/08/14 0804  Gross per 24 hour  Intake   1214 ml  Output   1400 ml  Net   -186 ml   Filed Weights   09/06/14 0422 09/07/14 0625 09/08/14 0625  Weight: 138 lb 1.6 oz (62.642 kg) 134 lb 1.6 oz (60.827 kg) 134 lb 14.4 oz (61.19 kg)    PHYSICAL EXAM Filed Vitals:   09/07/14 1430 09/07/14 2006 09/08/14 0541 09/08/14 0625  BP: 141/72 138/76 149/85   Pulse: 88 102 95   Temp: 98 F (36.7 C) 97.8 F (36.6 C) 98.1 F (36.7 C)   TempSrc: Oral Oral Oral   Resp: 18 17 18   Height:      Weight:    134 lb 14.4 oz (61.19 kg)  SpO2: 98% 98% 99%    General: Alert, oriented x3, no distress Head: no evidence of trauma, PERRL, EOMI, no exophtalmos or lid lag, no myxedema, no xanthelasma; normal ears, nose and oropharynx Neck: normal jugular venous pulsations and no hepatojugular reflux; brisk carotid pulses without delay and no carotid bruits Chest: clear to auscultation, no signs of consolidation by percussion or palpation, normal  fremitus, symmetrical and full respiratory excursions Cardiovascular: normal position and quality of the apical impulse, regular rhythm, normal first and second heart sounds, no rubs, +S4 gallop, no murmur Abdomen: no tenderness or distention, no masses by palpation, no abnormal pulsatility or arterial bruits, normal bowel sounds, no hepatosplenomegaly Extremities: no clubbing, cyanosis or edema; 2+ radial, ulnar and brachial pulses bilaterally; 2+ right femoral, posterior tibial and dorsalis pedis pulses; 2+ left femoral, posterior tibial and dorsalis pedis pulses; no subclavian or femoral bruits Neurological: grossly nonfocal  LABS  CBC  Recent Labs  09/05/14 2350 09/08/14 0458  WBC 8.0 8.0  HGB 15.0 14.3  HCT 45.1 43.6  MCV 86.7 87.6  PLT 195 189   Basic Metabolic Panel  Recent Labs  09/06/14 0515 09/07/14 1217 09/08/14 0458  NA 141  --  138  K 3.6  --  4.8  CL 110  --  107  CO2 24  --  26  GLUCOSE 133*  --  106*  BUN 16  --  18  CREATININE 0.77  --  0.73  CALCIUM 8.7  --  9.1  MG  --  2.1 2.1   Liver Function Tests  Recent Labs  09/06/14 0515  AST 84*  ALT 192*  ALKPHOS 111  BILITOT 0.7  PROT 6.1  ALBUMIN 3.2*   No results for input(s):   LIPASE, AMYLASE in the last 72 hours. Cardiac Enzymes  Recent Labs  09/05/14 2350 09/06/14 0515 09/06/14 1200  TROPONINI 0.08* 0.10* 0.11*   BNP Invalid input(s): POCBNP D-Dimer  Recent Labs  09/05/14 1741  DDIMER 1.41*   Hemoglobin A1C No results for input(s): HGBA1C in the last 72 hours. Fasting Lipid Panel  Recent Labs  09/06/14 0515  CHOL 145  HDL 40  LDLCALC 90  TRIG 75  CHOLHDL 3.6   Thyroid Function Tests  Recent Labs  09/05/14 2350  TSH 1.940    Radiology Studies Imaging results have been reviewed and No results found.    ASSESSMENT AND PLAN  Unexplained severe cardiomyopathy with diffuse LV dysfunction with possible regional variation, now with well compensated CHF by clinical  and echo criteria. Differential diagnosis includes painless ischemic cardiomyopathy and Takotsubo syndrome. Suspect background LVH due to HTN, but this is insufficient to explain her ECG changes and LV dysfunction. BP still relatively high - will add carvedilol. Marginally abnormal "plateau" elevation in troponin consistent with CHF, not acute coronary sd. Reevaluate mildly elevated LFTs in AM. Lipid profile is pretty good, statin indicated only if we identify significant CAD.  Coronary angio scheduled for this morning. This procedure has been fully reviewed with the patient and written informed consent has been obtained.    Clebert Wenger, MD, FACC [facility] [phone] office [phone] pager 09/08/2014 8:37 AM   

## 2014-09-08 NOTE — Progress Notes (Signed)
[facility] PROGRESS NOTE  Sheri Dunn WUJ:[id number] DOB: February 14, 1951 DOA: 09/05/2014 PCP: follows with PA of Dr Renae Gloss  Brief narrative 64 year old female with no past medical history of presented with dyspnea on exertion for past 6 days and weakness with finding of hypertensive emergency and elevated troponins. An found to have acute CHF on exam with 2-D echo showing markedly low EF of 25-30% with diffuse hypokinesis.   Assessment/Plan: NSTEMI EKG noted for diffuse T-wave inversions. Troponin peaked  at 0.11 with 2-D echo showing  EF of 25-30% with diffuse hypokinesis and grade 1 diastolic dysfunction. . Diuresed with Lasix on admission and currently euvolemic. Cardiac cath done showing following findings: 1. Normal left main coronary artery. 2. Mild disease in the left anterior descending artery and its branches. 3. 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 4. Severe disease in the distal right coronary artery which was successfully treated with a 3.0 x 16 Synergy drug-eluting stent, postdilated to 3.3 mm.        5.       Severely decreased left ventricular systolic function. LVEDP 10 mmHg. Ejection fraction 20%. normal left main coronary artery. EF of 20%.   PCI to mid circumflex and distal RCA. Cardiology recommend dual antiplatelet therapy with ASA and brinllinta for at least 1 year. Recommend repeat Echo to evaluate LV fn post cath. Needs aggressive lifestyle modifications. Check A1C and lipid panel in am.  Acute systolic CHF with ischemic cardiomyopathy Euvolemic after diuresed with 1 dose IV Lasix. Cardiac cath findings as above. Continue coreg and avapro.  Qt prolongation . Monitor K and mag  Accelerated hypertension Added BB and ARB  dvt  Prophylaxis:sq lovenox  Diet: Heart healthy,  Code Status: full code Family Communication: family at  bedside Disposition Plan: home tomorrow if stable overnight   Consultants:  cardiology  Procedures:  2d echo on 4/4  Cath on 4/5  Antibiotics:  none  HPI/Subjective: Patient seen and examined after returning from cath lab. Sleepy . Denies any chest pain  Objective: Filed Vitals:   09/08/14 1315  BP: 173/84  Pulse: 90  Temp:   Resp: 34    Intake/Output Summary (Last 24 hours) at 09/08/14 1511 Last data filed at 09/08/14 1135  Gross per 24 hour  Intake    462 ml  Output   1650 ml  Net  -1188 ml   Filed Weights   09/06/14 0422 09/07/14 0625 09/08/14 0625  Weight: 62.642 kg (138 lb 1.6 oz) 60.827 kg (134 lb 1.6 oz) 61.19 kg (134 lb 14.4 oz)    Exam:   General:  Elderly thin built female drowsy after returning from procedure  HEENT :  moist oral mucosa, no JVD  Chest: Clear to auscultation bilaterally, no added sounds  CVS: Normal S1 and S2, no murmurs rub or gallop  GI: Soft, nondistended, nontender bowel sounds present  Musculoskeletal: Warm, no edema,cath site( rt femoral groin) with clean dressing  Data Reviewed: Basic Metabolic Panel:  Recent Labs Lab 09/05/14 1511 09/06/14 0515 09/07/14 1217 09/08/14 0458  NA 139 141  --  138  K 4.2 3.6  --  4.8  CL 110 110  --  107  CO2 17* 24  --  26  GLUCOSE 113* 133*  --  106*  BUN 24* 16  --  18  CREATININE 0.83 0.77  --  0.73  CALCIUM 9.1 8.7  --  9.1  MG  --   --  2.1 2.1   Liver Function Tests:  Recent Labs Lab 09/06/14 0515  AST 84*  ALT 192*  ALKPHOS 111  BILITOT 0.7  PROT 6.1  ALBUMIN 3.2*   No results for input(s): LIPASE, AMYLASE in the last 168 hours. No results for input(s): AMMONIA in the last 168 hours. CBC:  Recent Labs Lab 09/05/14 1511 09/05/14 2350 09/08/14 0458  WBC 7.5 8.0 8.0  HGB 13.7 15.0 14.3  HCT 42.3 45.1 43.6  MCV 88.1 86.7 87.6  PLT 180 195 189   Cardiac Enzymes:  Recent Labs Lab 09/05/14 1741 09/05/14 2350 09/06/14 0515 09/06/14 1200   TROPONINI 0.08* 0.08* 0.10* 0.11*   BNP (last 3 results)  Recent Labs  09/05/14 1511  BNP 4342.6*    ProBNP (last 3 results) No results for input(s): PROBNP in the last 8760 hours.  CBG:  Recent Labs Lab 09/08/14 1325  GLUCAP 131*    No results found for this or any previous visit (from the past 240 hour(s)).   Studies: No results found.  Scheduled Meds: . aspirin  81 mg Oral Daily  . carvedilol  3.125 mg Oral BID WC  . irbesartan  150 mg Oral Daily  . sodium chloride  3 mL Intravenous Q12H  . sodium chloride  3 mL Intravenous Q12H  . ticagrelor  90 mg Oral BID   Continuous Infusions: . bivalirudin (ANGIOMAX) infusion 5 mg/mL (Cath Lab,ACS,PCI indication)        Time spent: 25 minutes    Sheri Dunn  [facility] Pager [phone] If 7PM-7AM, please contact night-coverage at [email or web address], password [email or web address] 09/08/2014, 3:11 PM  LOS: 3 days

## 2014-09-08 NOTE — Progress Notes (Signed)
Patient c/o pain at right radial site. Small "quarter-sized" hematoma noted. Manual pressure applied to sitex 5min. Hematoma expressed. No current bleeding or hematoma noted at site. 2x2 dressing reapplied. Right arm elevated on pillow. Patient states relief of pain. Will continue to monitor.Mamie LeversBaldwin, Rhyan Wolters E

## 2014-09-08 NOTE — CV Procedure (Signed)
PROCEDURE:  Left heart catheterization with selective coronary angiography, left ventriculogram.  PCI mid circumflex. PCI distal RCA.  INDICATIONS:  NSTEMI  The risks, benefits, and details of the procedure were explained to the patient.  The patient verbalized understanding and wanted to proceed.  Informed written consent was obtained.  PROCEDURE TECHNIQUE:  After Xylocaine anesthesia a 57F sheath was placed in the right femoral artery with a single anterior needle wall stick.   Left coronary angiography was done using a Judkins L4 guide catheter.  Right coronary angiography was done using a Judkins R4 guide catheter.  Left ventriculography was done using a pigtail catheter.    CONTRAST:  Total of 125 cc.  COMPLICATIONS:  None.    HEMODYNAMICS:  Aortic pressure was 128/69; LV pressure was 132/6; LVEDP 10.  There was no gradient between the left ventricle and aorta.    ANGIOGRAPHIC DATA:   The left main coronary artery was large and widely patent.  The left anterior descending artery is a large vessel which wraps around the apex. There is mild disease proximally. There is a focal area of mild to moderate disease in the mid vessel. The 2 medium-size diagonal vessels are widely patent. There is mild disease in the diagonals.  The left circumflex artery is a large vessel. There is a trivial first obtuse marginal which is patent. At the origin of an atrial branch, there is a focal 90% stenosis in the mid circumflex. The remainder of the circumflex splits into a large second obtuse marginal and the continuation of the circumflex which ends in the third obtuse marginal.  There is mild disease in the remainder of the circumflex outside of the severe lesion.  The right coronary artery is a large, dominant vessel. In the mid vessel, there is diffuse moderate disease. In the distal vessel, there is a focal severe lesion of 90%. The posterior lateral artery is medium size. The posterior descending  artery is medium-sized and patent.  LEFT VENTRICULOGRAM:  Left ventricular angiogram was done in the 30 RAO projection and revealed global left ventricular  systolic dysfunction of severe degree with an estimated ejection fraction of 20%.  LVEDP was 10 mmHg.  PCI NARRATIVE: A CLS 3.5 guiding catheter was used to engage the left main. IV Angiomax was used for anticoagulation. ACT was used to check that the Angiomax was therapeutic. A pro-water wire was placed across the area disease in the circumflex. A 2.5 x 12 balloon was used to predilate. A 3.5 x 16 Synergy drug-eluting stent was deployed at high pressure. The stent was postdilated with a 3.75 x 8 noncompliant balloon inflated to high pressure. There was an excellent angiographic result with no residual stenosis.  A JR4 guiding catheter was used to engage the RCA. The same pro-water wire was placed across the area disease distally. A 2.25 x 8 balloon was used to predilate. A 3.0 x 16 Synergy drug-eluting stent was used to stent the lesion at high pressure. The stent was postdilated with a 3.25 x 8 noncompliant balloon.  There was an excellent angiographic result with no residual stenosis.  IMPRESSIONS:  1. Normal left main coronary artery. 2. Mild disease in the left anterior descending artery and its branches. 3. 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 4. Severe disease in the distal right coronary artery which was successfully treated with a 3.0 x 16 Synergy  drug-eluting stent, postdilated to 3.3 mm. 5. Severely decreased left ventricular systolic function.  LVEDP 10 mmHg.  Ejection fraction 20%.  RECOMMENDATION:  Continue dual antiplatelet therapy for at least a year without interruption. Continue aggressive medical therapy for her LV dysfunction. She will need repeat evaluation of her left ventricular dysfunction after revascularization.  I do suspect there may be a nonischemic component as well since her anterior wall also appears hypokinetic.  Cardiology follow-up with Dr. Royann Shivers.

## 2014-09-09 DIAGNOSIS — I214 Non-ST elevation (NSTEMI) myocardial infarction: Principal | ICD-10-CM

## 2014-09-09 LAB — LIPID PANEL
CHOL/HDL RATIO: 3.4 ratio
CHOLESTEROL: 141 mg/dL (ref 0–200)
HDL: 41 mg/dL (ref >39)
LDL Cholesterol: 83 mg/dL (ref 0–99)
Triglycerides: 86 mg/dL (ref <150)
VLDL: 17 mg/dL (ref 0–40)

## 2014-09-09 LAB — HEPATIC FUNCTION PANEL
ALT: 93 U/L — AB (ref 0–35)
AST: 36 U/L (ref 0–37)
Albumin: 3.1 g/dL — ABNORMAL LOW (ref 3.5–5.2)
Alkaline Phosphatase: 95 U/L (ref 39–117)
Bilirubin, Direct: <0.1 mg/dL (ref 0.0–0.5)
Total Bilirubin: 0.7 mg/dL (ref 0.3–1.2)
Total Protein: 5.9 g/dL — ABNORMAL LOW (ref 6.0–8.3)

## 2014-09-09 LAB — CBC
HCT: 42.5 % (ref 36.0–46.0)
Hemoglobin: 13.9 g/dL (ref 12.0–15.0)
MCH: 28.4 pg (ref 26.0–34.0)
MCHC: 32.7 g/dL (ref 30.0–36.0)
MCV: 86.7 fL (ref 78.0–100.0)
Platelets: 203 10*3/uL (ref 150–400)
RBC: 4.9 MIL/uL (ref 3.87–5.11)
RDW: 15.4 % (ref 11.5–15.5)
WBC: 9.8 10*3/uL (ref 4.0–10.5)

## 2014-09-09 LAB — BASIC METABOLIC PANEL
Anion gap: 8 (ref 5–15)
BUN: 19 mg/dL (ref 6–23)
CO2: 22 mmol/L (ref 19–32)
Calcium: 9 mg/dL (ref 8.4–10.5)
Chloride: 106 mmol/L (ref 96–112)
Creatinine, Ser: 0.76 mg/dL (ref 0.50–1.10)
GFR calc Af Amer: >90 mL/min (ref >90)
GFR calc non Af Amer: 87 mL/min — ABNORMAL LOW (ref >90)
GLUCOSE: 137 mg/dL — AB (ref 70–99)
POTASSIUM: 4.3 mmol/L (ref 3.5–5.1)
Sodium: 136 mmol/L (ref 135–145)

## 2014-09-09 MED ORDER — CARVEDILOL 3.125 MG PO TABS
3.1250 mg | ORAL_TABLET | Freq: Once | ORAL | Status: AC
  Administered 2014-09-09: 3.125 mg via ORAL
  Filled 2014-09-09: qty 1

## 2014-09-09 MED ORDER — ATORVASTATIN CALCIUM 20 MG PO TABS
20.0000 mg | ORAL_TABLET | Freq: Every day | ORAL | Status: DC
  Filled 2014-09-09: qty 1

## 2014-09-09 MED ORDER — PRASUGREL HCL 10 MG PO TABS: 10.0000 mg | ORAL_TABLET | Freq: Every day | ORAL | Status: DC

## 2014-09-09 MED ORDER — ATORVASTATIN CALCIUM 20 MG PO TABS: 20.0000 mg | ORAL_TABLET | Freq: Every day | ORAL | Status: DC

## 2014-09-09 MED ORDER — CARVEDILOL 6.25 MG PO TABS: 6.2500 mg | ORAL_TABLET | Freq: Two times a day (BID) | ORAL | Status: DC

## 2014-09-09 MED ORDER — PRASUGREL HCL 10 MG PO TABS
30.0000 mg | ORAL_TABLET | Freq: Once | ORAL | Status: AC
  Administered 2014-09-09: 30 mg via ORAL
  Filled 2014-09-09: qty 3

## 2014-09-09 MED ORDER — ANGIOPLASTY BOOK
Freq: Once | Status: AC
  Administered 2014-09-09: 01:00:00
  Filled 2014-09-09: qty 1

## 2014-09-09 MED ORDER — ASPIRIN 81 MG PO CHEW: 81.0000 mg | CHEWABLE_TABLET | Freq: Every day | ORAL | Status: DC

## 2014-09-09 MED ORDER — CARVEDILOL 3.125 MG PO TABS: 6.2500 mg | ORAL_TABLET | Freq: Two times a day (BID) | ORAL | Status: DC

## 2014-09-09 MED FILL — Sodium Chloride IV Soln 0.9%: INTRAVENOUS | Qty: 50 | Status: AC

## 2014-09-09 NOTE — Progress Notes (Signed)
Subjective: No CP, SOB or orthopnea  Objective: Vital signs in last 24 hours: Temp:  [97.7 F (36.5 C)-98.4 F (36.9 C)] 98.4 F (36.9 C) (04/06 0753) Pulse Rate:  [35-103] 86 (04/06 0753) Resp:  [12-40] 16 (04/06 0753) BP: (114-173)/(50-104) 121/59 mmHg (04/06 0753) SpO2:  [94 %-100 %] 95 % (04/06 0753) Weight:  [124 lb 1.9 oz (56.3 kg)] 124 lb 1.9 oz (56.3 kg) (04/06 0500) Last BM Date: 09/07/14  Intake/Output from previous day: 04/05 0701 - 04/06 0700 In: 702.4 [P.O.:480; I.V.:222.4] Out: 1000 [Urine:1000] Intake/Output this shift: Total I/O In: 220 [P.O.:220] Out: -   Medications Current Facility-Administered Medications  Medication Dose Route Frequency Provider Last Rate Last Dose  . 0.9 %  sodium chloride infusion  250 mL Intravenous PRN Pearson Grippe, MD      . acetaminophen (TYLENOL) tablet 650 mg  650 mg Oral Q6H PRN Pearson Grippe, MD       Or  . acetaminophen (TYLENOL) suppository 650 mg  650 mg Rectal Q6H PRN Pearson Grippe, MD      . acetaminophen (TYLENOL) tablet 650 mg  650 mg Oral Q4H PRN Corky Crafts, MD      . aspirin chewable tablet 81 mg  81 mg Oral Daily Corky Crafts, MD   0 mg at 09/08/14 1400  . carvedilol (COREG) tablet 3.125 mg  3.125 mg Oral BID WC Mihai Croitoru, MD   3.125 mg at 09/09/14 0636  . hydrALAZINE (APRESOLINE) injection 10 mg  10 mg Intravenous Q6H PRN Pearson Grippe, MD      . irbesartan Evlyn Kanner) tablet 150 mg  150 mg Oral Daily Marinda Elk, MD   150 mg at 09/07/14 1054  . morphine 2 MG/ML injection 1 mg  1 mg Intravenous Q1H PRN Corky Crafts, MD   1 mg at 09/08/14 1411  . ondansetron (ZOFRAN) injection 4 mg  4 mg Intravenous Q6H PRN Corky Crafts, MD      . sodium chloride 0.9 % injection 3 mL  3 mL Intravenous Q12H Pearson Grippe, MD   3 mL at 09/07/14 1055  . sodium chloride 0.9 % injection 3 mL  3 mL Intravenous Q12H Pearson Grippe, MD   3 mL at 09/08/14 2152  . sodium chloride 0.9 % injection 3 mL  3 mL Intravenous PRN  Pearson Grippe, MD      . ticagrelor [facility]) tablet 90 mg  90 mg Oral BID Corky Crafts, MD   90 mg at 09/08/14 2151    PE: General appearance: alert, cooperative and no distress Lungs: clear to auscultation bilaterally Heart: regular rate and rhythm, S1, S2 normal, no murmur, click, rub or gallop Extremities: No LEE Pulses: 2+ and symmetric Skin: Warm and dry.  Right wrist:  stable Neurologic: Grossly normal  Lab Results:   Recent Labs  09/08/14 0458 09/09/14 0334  WBC 8.0 9.8  HGB 14.3 13.9  HCT 43.6 42.5  PLT 189 203   BMET  Recent Labs  09/08/14 0458 09/09/14 0334  NA 138 136  K 4.8 4.3  CL 107 106  CO2 26 22  GLUCOSE 106* 137*  BUN 18 19  CREATININE 0.73 0.76  CALCIUM 9.1 9.0   PT/INR  Recent Labs  09/08/14 0458  LABPROT 14.7  INR 1.14   Cholesterol  Recent Labs  09/09/14 0334  CHOL 141   Lipid Panel     Component Value Date/Time   CHOL 141 09/09/2014 0334  TRIG 86 09/09/2014 0334   HDL 41 09/09/2014 0334   CHOLHDL 3.4 09/09/2014 0334   VLDL 17 09/09/2014 0334   LDLCALC 83 09/09/2014 0334     Assessment/Plan   Active Problems:   Hyperlipidemia   Congestive heart failure   Hypertensive emergency   Elevated troponin   QT prolongation   Abnormal LFTs   Acute systolic CHF (congestive heart failure)   NSTEMI (non-ST elevated myocardial infarction)   Accelerated hypertension   Cardiomyopathy, ischemic   SP left heart cath revealing Normal left main coronary artery.  Mild disease in the left anterior descending artery and its branches.  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  Severe disease in the distal right coronary artery which was successfully treated with a 3.0 x 16 Synergy drug-eluting stent, postdilated to 3.3 mm.  Severely decreased left ventricular systolic function.  25-30% by echo.  LVEDP 10 mmHg. Ejection  fraction 20%.  DAPT for a year with ASA, Brilinta.  Also on ARB and Coreg.   Lots of PVCs on tele.  Increase coreg to 6.25 BID.     We discussed daily weight monitoring, low sodium diet and when to call the office.     Follow up appt scheduled.    LOS: 4 days    HAGER, BRYAN PA-C 09/09/2014 9:28 AM  Patient seen, examined. Available data reviewed. Agree with findings, assessment, and plan as outlined by Wilburt FinlayBryan Hager, PA-C. Exam reveals an alert, oriented woman in NAD. Lungs CTA. Heart RRR. No peripheral edema. Shortness of breath is a significant complaint and I think Brilinta is probably not a good choice because of that. Will change to Effient. Would keep out of work at least 2 weeks. Will arrange cardiology follow-up next week. Otherwise continue current meds and add statin drug. Tele reviewed and shows single PVC's but no sustained arrhythmia.   Tonny BollmanMichael Klaire Court, M.D. 09/09/2014 11:07 AM

## 2014-09-09 NOTE — Discharge Summary (Signed)
Physician Discharge Summary  Sheri CurlsCarolyn Hoyt Dunn:[id number] DOB: [birth year] DOA: 09/05/2014  PCP: Malachy Chamberakia Starkes, FNP  Admit date: 09/05/2014 Discharge date: 09/09/2014  Time spent: 35 minutes  Recommendations for Outpatient Follow-up:  1. Discharge home with outpt PCP and cardiology follow up  Discharge Diagnoses:   Principle problem  NSTEMI  Active Problems:    Acute systolic CHF (congestive heart failure)   Accelerated hypertension   Cardiomyopathy, ischemic   Hyperlipidemia   Elevated troponin   QT prolongation   Abnormal LFTs    Discharge Condition: FAIR  Diet recommendation: Heart healthy  Filed Weights   09/08/14 0625 09/09/14 0019 09/09/14 0500  Weight: 61.19 kg (134 lb 14.4 oz) 56.3 kg (124 lb 1.9 oz) 56.3 kg (124 lb 1.9 oz)    History of present illness:  Please refer to admission H&P for details, in brief, 64 year old female with no past medical history of presented with dyspnea on exertion for past 6 days and weakness with finding of hypertensive emergency and elevated troponins. An found to have acute CHF on exam with 2-D echo showing markedly low EF of 25-30% with diffuse hypokinesis.  Hospital Course:  Non-ST elevation MI EKG noted for diffuse T-wave inversions. Troponin peaked at 0.11 with 2-D echo showing EF of 25-30% with diffuse hypokinesis and grade 1 diastolic dysfunction. . Diuresed with Lasix on admission and currently euvolemic. Cardiac cath done showing following findings: 1. Normal left main coronary artery. 2. Mild disease in the left anterior descending artery and its branches. 3. 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 4. Severe disease in the distal right coronary artery which was successfully treated with a 3.0 x 16 Synergy drug-eluting stent, postdilated to 3.3 mm.  5. Severely decreased left ventricular systolic function.  LVEDP 10 mmHg. Ejection fraction 20%. normal left main coronary artery. EF of 20%.    s/p PCI to mid circumflex and distal RCA.  Cardiology recommend dual antiplatelet therapy with ASA and effient  for at least 1 year ( given shortness of breath brillinta was changed to effient). Recommend repeat Echo to evaluate LV fn possibly in few months. Needs aggressive lifestyle modifications including diet and exercise, mediation adherence and follow up which was discussed in detail.  LDL of 83. Added statin. Continue beta blocker and ARB. Patient clinically stable. PVCs on monitor. increased coreg dose. Will follow up with cardiology ad cardiac rehab as outpt. Recommend rest for 2 weeks.  Acute systolic CHF with ischemic cardiomyopathy Euvolemic after diuresed with 1 dose IV Lasix. Cardiac cath findings as above. Continue aspirin, Coreg, ARB and statin.   Qt prolongation (522) - K and mag normal . Follow as outpt  Accelerated hypertension Added BB and ARB    Diet: Heart healthy,  Code Status: full code  Family Communication: none at bedside  Disposition Plan: home with outpt PCP and cardiology follow up   Consultants:  cardiology  Procedures:  2d echo on 4/4  Cardiac Cath on 4/5  Antibiotics:  none    Discharge Exam: Filed Vitals:   09/09/14 0800  BP:   Pulse:   Temp:   Resp: 23    General: Elderly thin built female in no acute distress HEENT: No pallor, moist oral mucosa, no JVD, supple neck Chest: Clear to auscultation bilaterally, no added sounds CVS: Normal S1 and S2, no murmurs rub or gallop GI: Soft, nondistended, nontender, bowel sounds present Musculoskeletal:  Warm, no edema, right groin area appears clean, no hematoma or bleeding CNS: Alert and oriented   Discharge Instructions   Discharge Instructions    Amb Referral to Cardiac Rehabilitation    Complete by:  As directed           Current Discharge Medication List    START taking these  medications   Details  aspirin 81 MG chewable tablet Chew 1 tablet (81 mg total) by mouth daily. Qty: 30 tablet, Refills: 3    atorvastatin (LIPITOR) 20 MG tablet Take 1 tablet (20 mg total) by mouth daily at 6 PM. Qty: 30 tablet, Refills: 0    carvedilol (COREG) 6.25 MG tablet Take 1 tablet (6.25 mg total) by mouth 2 (two) times daily with a meal. Qty: 60 tablet, Refills: 3    prasugrel (EFFIENT) 10 MG TABS tablet Take 1 tablet (10 mg total) by mouth daily. Qty: 30 tablet, Refills: 3      CONTINUE these medications which have NOT CHANGED   Details  Cholecalciferol (VITAMIN D3) 2000 UNITS TABS Take 2,000 Units by mouth daily.    valsartan (DIOVAN) 80 MG tablet Take 80 mg by mouth daily. Refills: 0      STOP taking these medications     HYDROcodone-acetaminophen (NORCO/VICODIN) 5-325 MG per tablet        No Known Allergies Follow-up Information    Follow up with Tereso Newcomer, PA-C On 09/28/2014.   Specialty:  Physician Assistant   Why:  3:20 PM   Contact information:   [address] [place] [address] [phone]       Please follow up.   Why:  with PCP Malachy Chamber, FNP on 09/12/2014       The results of significant diagnostics from this hospitalization (including imaging, microbiology, ancillary and laboratory) are listed below for reference.    Significant Diagnostic Studies: Dg Chest 2 View  09/05/2014   CLINICAL DATA:  Dry cough and shortness of breath for 5 days  EXAM: CHEST  2 VIEW  COMPARISON:  03/09/2013  FINDINGS: Cardiac shadow is enlarged but stable. The lungs are hyperinflated. Mild interstitial changes are seen although no focal infiltrate is noted. No bony abnormality is seen.  IMPRESSION: COPD without acute abnormality   Electronically Signed   By: Alcide Clever M.D.   On: 09/05/2014 16:01   Ct Angio Chest Pe W/cm &/or Wo Cm  09/05/2014   CLINICAL DATA:  Dyspnea.  Chest soreness.  Shortness of breath.  EXAM: CT ANGIOGRAPHY CHEST WITH  CONTRAST  TECHNIQUE: Multidetector CT imaging of the chest was performed using the standard protocol during bolus administration of intravenous contrast. Multiplanar CT image reconstructions and MIPs were obtained to evaluate the vascular anatomy.  CONTRAST:  OMNIPAQUE IOHEXOL 350 MG/ML SOLN  COMPARISON:  03/09/2013  FINDINGS: Despite efforts by the technologist and patient, motion artifact is present on today's exam and could not be eliminated. This reduces exam sensitivity and specificity.  Mediastinum/Nodes: No filling defect is identified in the pulmonary arterial tree to suggest pulmonary embolus.  Imaging is performed prior to aortic opacification and accordingly today' s exam is not sensitive in assessing the aorta 4 certain vascular conditions such as dissection.  Borderline prominent AP window lymph nodes. There is moderate to prominent cardiomegaly primarily involving the left heart. Coronary atherosclerosis. Large main pulmonary artery at 4.1 cm ; ascending aorta at 3.9 cm.  Lungs/Pleura: Secondary pulmonary lobular interstitial thickening is present in the lungs  although is mild. There is bibasilar atelectasis along with bilateral airway thickening. Small right and trace left pleural effusions  Upper abdomen: Unremarkable  Musculoskeletal: Unremarkable  Review of the MIP images confirms the above findings.  IMPRESSION: 1. No filling defect is identified in the pulmonary arterial tree to suggest pulmonary embolus. 2. Moderate to prominent cardiomegaly especially involving the left heart, with coronary atherosclerosis, and also interstitial accentuation which may reflect interstitial pulmonary edema. 3. Small right and trace left pleural effusions. Mild atelectasis in both lower lobes. 4. Airway thickening may reflect mild interstitial edema or bronchitis. 5. Prominent main pulmonary artery could reflect pulmonary arterial hypertension.   Electronically Signed   By: Gaylyn Rong M.D.   On:  09/05/2014 21:41    Microbiology: No results found for this or any previous visit (from the past 240 hour(s)).   Labs: Basic Metabolic Panel:  Recent Labs Lab 09/05/14 1511 09/06/14 0515 09/07/14 1217 09/08/14 0458 09/09/14 0334  NA 139 141  --  138 136  K 4.2 3.6  --  4.8 4.3  CL 110 110  --  107 106  CO2 17* 24  --  26 22  GLUCOSE 113* 133*  --  106* 137*  BUN 24* 16  --  18 19  CREATININE 0.83 0.77  --  0.73 0.76  CALCIUM 9.1 8.7  --  9.1 9.0  MG  --   --  2.1 2.1  --    Liver Function Tests:  Recent Labs Lab 09/06/14 0515 09/09/14 0334  AST 84* 36  ALT 192* 93*  ALKPHOS 111 95  BILITOT 0.7 0.7  PROT 6.1 5.9*  ALBUMIN 3.2* 3.1*   No results for input(s): LIPASE, AMYLASE in the last 168 hours. No results for input(s): AMMONIA in the last 168 hours. CBC:  Recent Labs Lab 09/05/14 1511 09/05/14 2350 09/08/14 0458 09/09/14 0334  WBC 7.5 8.0 8.0 9.8  HGB 13.7 15.0 14.3 13.9  HCT 42.3 45.1 43.6 42.5  MCV 88.1 86.7 87.6 86.7  PLT 180 195 189 203   Cardiac Enzymes:  Recent Labs Lab 09/05/14 1741 09/05/14 2350 09/06/14 0515 09/06/14 1200  TROPONINI 0.08* 0.08* 0.10* 0.11*   BNP: BNP (last 3 results)  Recent Labs  09/05/14 1511  BNP 4342.6*    ProBNP (last 3 results) No results for input(s): PROBNP in the last 8760 hours.  CBG:  Recent Labs Lab 09/08/14 1325 09/08/14 1727 09/08/14 2115  GLUCAP 131* 188* 93       Signed:  Fantashia Shupert  [facility] 09/09/2014, 12:57 PM

## 2014-09-09 NOTE — Progress Notes (Signed)
CARDIAC REHAB PHASE I   PRE:  Rate/Rhythm: 79 SR with PVC"s  BP:  Supine:   Sitting: 103/62  Standing:    SaO2: 97 RA  MODE:  Ambulation: 420 ft   POST:  Rate/Rhythm: 89 SR with PVC's  BP:  Supine:   Sitting: 148/84  Standing:    SaO2: 99 RA 1056-1230 Pt tolerated ambulation with three standing rest stops. She tires easily and is DOE, RA sat in hall 99% when she felt SOB. Completed MI, stent and CHF education with pt, son and brother. Pt voices understanding. [facility] [place], will send referral.  Melina CopaLisa Tayah Idrovo RN 09/09/2014 12:28 PM

## 2014-09-09 NOTE — Discharge Instructions (Addendum)
1.  Monitor your weight every morning.  If you gain 3 pounds in 24 hours, or 5 pounds in a week, call the office([phone]) for instructions.    Heart Failure Heart failure is a condition in which the heart has trouble pumping blood. This means your heart does not pump blood efficiently for your body to work well. In some cases of heart failure, fluid may back up into your lungs or you may have swelling (edema) in your lower legs. Heart failure is usually a long-term (chronic) condition. It is important for you to take good care of yourself and follow your health care provider's treatment plan. CAUSES  Some health conditions can cause heart failure. Those health conditions include:  High blood pressure (hypertension). Hypertension causes the heart muscle to work harder than normal. When pressure in the blood vessels is high, the heart needs to pump (contract) with more force in order to circulate blood throughout the body. High blood pressure eventually causes the heart to become stiff and weak.  Coronary artery disease (CAD). CAD is the buildup of cholesterol and fat (plaque) in the arteries of the heart. The blockage in the arteries deprives the heart muscle of oxygen and blood. This can cause chest pain and may lead to a heart attack. High blood pressure can also contribute to CAD.  Heart attack (myocardial infarction). A heart attack occurs when one or more arteries in the heart become blocked. The loss of oxygen damages the muscle tissue of the heart. When this happens, part of the heart muscle dies. The injured tissue does not contract as well and weakens the heart's ability to pump blood.  Abnormal heart valves. When the heart valves do not open and close properly, it can cause heart failure. This makes the heart muscle pump harder to keep the blood flowing.  Heart muscle disease (cardiomyopathy or myocarditis). Heart muscle disease is damage to the heart muscle from a variety of causes. These  can include drug or alcohol abuse, infections, or unknown reasons. These can increase the risk of heart failure.  Lung disease. Lung disease makes the heart work harder because the lungs do not work properly. This can cause a strain on the heart, leading it to fail.  Diabetes. Diabetes increases the risk of heart failure. High blood sugar contributes to high fat (lipid) levels in the blood. Diabetes can also cause slow damage to tiny blood vessels that carry important nutrients to the heart muscle. When the heart does not get enough oxygen and food, it can cause the heart to become weak and stiff. This leads to a heart that does not contract efficiently.  Other conditions can contribute to heart failure. These include abnormal heart rhythms, thyroid problems, and low blood counts (anemia). Certain unhealthy behaviors can increase the risk of heart failure, including:  Being overweight.  Smoking or chewing tobacco.  Eating foods high in fat and cholesterol.  Abusing illicit drugs or alcohol.  Lacking physical activity. SYMPTOMS  Heart failure symptoms may vary and can be hard to detect. Symptoms may include:  Shortness of breath with activity, such as climbing stairs.  Persistent cough.  Swelling of the feet, ankles, legs, or abdomen.  Unexplained weight gain.  Difficulty breathing when lying flat (orthopnea).  Waking from sleep because of the need to sit up and get more air.  Rapid heartbeat.  Fatigue and loss of energy.  Feeling light-headed, dizzy, or close to fainting.  Loss of appetite.  Nausea.  Increased urination  during the night (nocturia). DIAGNOSIS  A diagnosis of heart failure is based on your history, symptoms, physical examination, and diagnostic tests. Diagnostic tests for heart failure may include:  Echocardiography.  Electrocardiography.  Chest X-ray.  Blood tests.  Exercise stress test.  Cardiac angiography.  Radionuclide scans. TREATMENT    Treatment is aimed at managing the symptoms of heart failure. Medicines, behavioral changes, or surgical intervention may be necessary to treat heart failure.  Medicines to help treat heart failure may include:  Angiotensin-converting enzyme (ACE) inhibitors. This type of medicine blocks the effects of a blood protein called angiotensin-converting enzyme. ACE inhibitors relax (dilate) the blood vessels and help lower blood pressure.  Angiotensin receptor blockers (ARBs). This type of medicine blocks the actions of a blood protein called angiotensin. Angiotensin receptor blockers dilate the blood vessels and help lower blood pressure.  Water pills (diuretics). Diuretics cause the kidneys to remove salt and water from the blood. The extra fluid is removed through urination. This loss of extra fluid lowers the volume of blood the heart pumps.  Beta blockers. These prevent the heart from beating too fast and improve heart muscle strength.  Digitalis. This increases the force of the heartbeat.  Healthy behavior changes include:  Obtaining and maintaining a healthy weight.  Stopping smoking or chewing tobacco.  Eating heart-healthy foods.  Limiting or avoiding alcohol.  Stopping illicit drug use.  Physical activity as directed by your health care provider.  Surgical treatment for heart failure may include:  A procedure to open blocked arteries, repair damaged heart valves, or remove damaged heart muscle tissue.  A pacemaker to improve heart muscle function and control certain abnormal heart rhythms.  An internal cardioverter defibrillator to treat certain serious abnormal heart rhythms.  A left ventricular assist device (LVAD) to assist the pumping ability of the heart. HOME CARE INSTRUCTIONS   Take medicines only as directed by your health care provider. Medicines are important in reducing the workload of your heart, slowing the progression of heart failure, and improving your  symptoms.  Do not stop taking your medicine unless directed by your health care provider.  Do not skip any dose of medicine.  Refill your prescriptions before you run out of medicine. Your medicines are needed every day.  Engage in moderate physical activity if directed by your health care provider. Moderate physical activity can benefit some people. The elderly and people with severe heart failure should consult with a health care provider for physical activity recommendations.  Eat heart-healthy foods. Food choices should be free of trans fat and low in saturated fat, cholesterol, and salt (sodium). Healthy choices include fresh or frozen fruits and vegetables, fish, lean meats, legumes, fat-free or low-fat dairy products, and whole grain or high fiber foods. Talk to a dietitian to learn more about heart-healthy foods.  Limit sodium if directed by your health care provider. Sodium restriction may reduce symptoms of heart failure in some people. Talk to a dietitian to learn more about heart-healthy seasonings.  Use healthy cooking methods. Healthy cooking methods include roasting, grilling, broiling, baking, poaching, steaming, or stir-frying. Talk to a dietitian to learn more about healthy cooking methods.  Limit fluids if directed by your health care provider. Fluid restriction may reduce symptoms of heart failure in some people.  Weigh yourself every day. Daily weights are important in the early recognition of excess fluid. You should weigh yourself every morning after you urinate and before you eat breakfast. Wear the same amount of clothing  each time you weigh yourself. Record your daily weight. Provide your health care provider with your weight record.  Monitor and record your blood pressure if directed by your health care provider.  Check your pulse if directed by your health care provider.  Lose weight if directed by your health care provider. Weight loss may reduce symptoms of heart  failure in some people.  Stop smoking or chewing tobacco. Nicotine makes your heart work harder by causing your blood vessels to constrict. Do not use nicotine gum or patches before talking to your health care provider.  Keep all follow-up visits as directed by your health care provider. This is important.  Limit alcohol intake to no more than 1 drink per day for nonpregnant women and 2 drinks per day for men. One drink equals 12 ounces of beer, 5 ounces of wine, or 1 ounces of hard liquor. Drinking more than that is harmful to your heart. Tell your health care provider if you drink alcohol several times a week. Talk with your health care provider about whether alcohol is safe for you. If your heart has already been damaged by alcohol or you have severe heart failure, drinking alcohol should be stopped completely.  Stop illicit drug use.  Stay up-to-date with immunizations. It is especially important to prevent respiratory infections through current pneumococcal and influenza immunizations.  Manage other health conditions such as hypertension, diabetes, thyroid disease, or abnormal heart rhythms as directed by your health care provider.  Learn to manage stress.  Plan rest periods when fatigued.  Learn strategies to manage high temperatures. If the weather is extremely hot:  Avoid vigorous physical activity.  Use air conditioning or fans or seek a cooler location.  Avoid caffeine and alcohol.  Wear loose-fitting, lightweight, and light-colored clothing.  Learn strategies to manage cold temperatures. If the weather is extremely cold:  Avoid vigorous physical activity.  Layer clothes.  Wear mittens or gloves, a hat, and a scarf when going outside.  Avoid alcohol.  Obtain ongoing education and support as needed.  Participate in or seek rehabilitation as needed to maintain or improve independence and quality of life. SEEK MEDICAL CARE IF:   Your weight increases by 03 lb/1.4 kg  in 1 day or 05 lb/2.3 kg in a week.  You have increasing shortness of breath that is unusual for you.  You are unable to participate in your usual physical activities.  You tire easily.  You cough more than normal, especially with physical activity.  You have any or more swelling in areas such as your hands, feet, ankles, or abdomen.  You are unable to sleep because it is hard to breathe.  You feel like your heart is beating fast (palpitations).  You become dizzy or light-headed upon standing up. SEEK IMMEDIATE MEDICAL CARE IF:   You have difficulty breathing.  There is a change in mental status such as decreased alertness or difficulty with concentration.  You have a pain or discomfort in your chest.  You have an episode of fainting (syncope). MAKE SURE YOU:   Understand these instructions.  Will watch your condition.  Will get help right away if you are not doing well or get worse. Document Released: 05/22/2005 Document Revised: 10/06/2013 Document Reviewed: 06/21/2012 [facility] Patient Information 2015 [place], [place]. This information is not intended to replace advice given to you by your health care provider. Make sure you discuss any questions you have with your health care provider.

## 2014-09-10 LAB — HEMOGLOBIN A1C
HEMOGLOBIN A1C: 6.2 % — AB (ref 4.8–5.6)
MEAN PLASMA GLUCOSE: 131 mg/dL

## 2014-09-15 ENCOUNTER — Encounter: Payer: PRIVATE HEALTH INSURANCE | Admitting: Nurse Practitioner

## 2014-09-28 ENCOUNTER — Encounter: Payer: Self-pay | Admitting: Physician Assistant

## 2014-09-28 ENCOUNTER — Ambulatory Visit (INDEPENDENT_AMBULATORY_CARE_PROVIDER_SITE_OTHER): Payer: PRIVATE HEALTH INSURANCE | Admitting: Physician Assistant

## 2014-09-28 VITALS — BP 110/80 | HR 76 | Ht 66.0 in | Wt 133.0 lb

## 2014-09-28 DIAGNOSIS — I5022 Chronic systolic (congestive) heart failure: Secondary | ICD-10-CM | POA: Diagnosis not present

## 2014-09-28 DIAGNOSIS — I10 Essential (primary) hypertension: Secondary | ICD-10-CM

## 2014-09-28 DIAGNOSIS — R9431 Abnormal electrocardiogram [ECG] [EKG]: Secondary | ICD-10-CM

## 2014-09-28 DIAGNOSIS — I2510 Atherosclerotic heart disease of native coronary artery without angina pectoris: Secondary | ICD-10-CM | POA: Diagnosis not present

## 2014-09-28 DIAGNOSIS — I4581 Long QT syndrome: Secondary | ICD-10-CM

## 2014-09-28 DIAGNOSIS — I351 Nonrheumatic aortic (valve) insufficiency: Secondary | ICD-10-CM

## 2014-09-28 DIAGNOSIS — I255 Ischemic cardiomyopathy: Secondary | ICD-10-CM | POA: Diagnosis not present

## 2014-09-28 DIAGNOSIS — E785 Hyperlipidemia, unspecified: Secondary | ICD-10-CM

## 2014-09-28 MED ORDER — ATORVASTATIN CALCIUM 40 MG PO TABS: 40.0000 mg | ORAL_TABLET | Freq: Every day | ORAL | Status: DC

## 2014-09-28 NOTE — Patient Instructions (Addendum)
Cardiac Diet This diet can help prevent heart disease and stroke. Many factors influence your heart health, including eating and exercise habits. Coronary risk rises a lot with abnormal blood fat (lipid) levels. Cardiac meal planning includes limiting unhealthy fats, increasing healthy fats, and making other small dietary changes. General guidelines are as follows:  Adjust calorie intake to reach and maintain desirable body weight.  Limit total fat intake to less than 30% of total calories. Saturated fat should be less than 7% of calories.  Saturated fats are found in animal products and in some vegetable products. Saturated vegetable fats are found in coconut oil, cocoa butter, palm oil, and palm kernel oil. Read labels carefully to avoid these products as much as possible. Use butter in moderation. Choose tub margarines and oils that have 2 grams of fat or less. Good cooking oils are canola and olive oils.  Practice low-fat cooking techniques. Do not fry food. Instead, broil, bake, boil, steam, grill, roast on a rack, stir-fry, or microwave it. Other fat reducing suggestions include:  Remove the skin from poultry.  Remove all visible fat from meats.  Skim the fat off stews, soups, and gravies before serving them.  Steam vegetables in water or broth instead of sauting them in fat.  Avoid foods with trans fat (or hydrogenated oils), such as commercially fried foods and commercially baked goods. Commercial shortening and deep-frying fats will contain trans fat.  Increase intake of fruits, vegetables, whole grains, and legumes to replace foods high in fat.  Increase consumption of nuts, legumes, and seeds to at least 4 servings weekly. One serving of a legume equals  cup, and 1 serving of nuts or seeds equals  cup.  Choose whole grains more often. Have 3 servings per day (a serving is 1 ounce [oz]).  Eat 4 to 5 servings of vegetables per day. A serving of vegetables is 1 cup of raw leafy  vegetables;  cup of raw or cooked cut-up vegetables;  cup of vegetable juice.  Eat 4 to 5 servings of fruit per day. A serving of fruit is 1 medium whole fruit;  cup of dried fruit;  cup of fresh, frozen, or canned fruit;  cup of 100% fruit juice.  Increase your intake of dietary fiber to 20 to 30 grams per day. Insoluble fiber may help lower your risk of heart disease and may help curb your appetite.  Soluble fiber binds cholesterol to be removed from the blood. Foods high in soluble fiber are dried beans, citrus fruits, oats, apples, bananas, broccoli, Brussels sprouts, and eggplant.  Try to include foods fortified with plant sterols or stanols, such as yogurt, breads, juices, or margarines. Choose several fortified foods to achieve a daily intake of 2 to 3 grams of plant sterols or stanols.  Foods with omega-3 fats can help reduce your risk of heart disease. Aim to have a 3.5 oz portion of fatty fish twice per week, such as salmon, mackerel, albacore tuna, sardines, lake trout, or herring. If you wish to take a fish oil supplement, choose one that contains 1 gram of both DHA and EPA.  Limit processed meats to 2 servings (3 oz portion) weekly.  Limit the sodium in your diet to 1500 milligrams (mg) per day. If you have high blood pressure, talk to a registered dietitian about a DASH (Dietary Approaches to Stop Hypertension) eating plan.  Limit sweets and beverages with added sugar, such as soda, to no more than 5 servings per week. One  serving is:   1 tablespoon sugar.  1 tablespoon jelly or jam.   cup sorbet.  1 cup lemonade.   cup regular soda. CHOOSING FOODS Starches  Allowed: Breads: All kinds (wheat, rye, raisin, white, oatmeal, Italian, French, and English muffin bread). Low-fat rolls: English muffins, frankfurter and hamburger buns, bagels, pita bread, tortillas (not fried). Pancakes, waffles, biscuits, and muffins made with recommended oil.  Avoid: Products made with  saturated or trans fats, oils, or whole milk products. Butter rolls, cheese breads, croissants. Commercial doughnuts, muffins, sweet rolls, biscuits, waffles, pancakes, store-bought mixes. Crackers  Allowed: Low-fat crackers and snacks: Animal, graham, rye, saltine (with recommended oil, no lard), oyster, and matzo crackers. Bread sticks, melba toast, rusks, flatbread, pretzels, and light popcorn.  Avoid: High-fat crackers: cheese crackers, butter crackers, and those made with coconut, palm oil, or trans fat (hydrogenated oils). Buttered popcorn. Cereals  Allowed: Hot or cold whole-grain cereals.  Avoid: Cereals containing coconut, hydrogenated vegetable fat, or animal fat. Potatoes / Pasta / Rice  Allowed: All kinds of potatoes, rice, and pasta (such as macaroni, spaghetti, and noodles).  Avoid: Pasta or rice prepared with cream sauce or high-fat cheese. Chow mein noodles, French fries. Vegetables  Allowed: All vegetables and vegetable juices.  Avoid: Fried vegetables. Vegetables in cream, butter, or high-fat cheese sauces. Limit coconut. Fruit in cream or custard. Protein  Allowed: Limit your intake of meat, seafood, and poultry to no more than 6 oz (cooked weight) per day. All lean, well-trimmed beef, veal, pork, and lamb. All chicken and turkey without skin. All fish and shellfish. Wild game: wild duck, rabbit, pheasant, and venison. Egg whites or low-cholesterol egg substitutes may be used as desired. Meatless dishes: recipes with dried beans, peas, lentils, and tofu (soybean curd). Seeds and nuts: all seeds and most nuts.  Avoid: Prime grade and other heavily marbled and fatty meats, such as short ribs, spare ribs, rib eye roast or steak, frankfurters, sausage, bacon, and high-fat luncheon meats, mutton. Caviar. Commercially fried fish. Domestic duck, goose, venison sausage. Organ meats: liver, gizzard, heart, chitterlings, brains, kidney, sweetbreads. Dairy  Allowed: Low-fat  cheeses: nonfat or low-fat cottage cheese (1% or 2% fat), cheeses made with part skim milk, such as mozzarella, farmers, string, or ricotta. (Cheeses should be labeled no more than 2 to 6 grams fat per oz.). Skim (or 1%) milk: liquid, powdered, or evaporated. Buttermilk made with low-fat milk. Drinks made with skim or low-fat milk or cocoa. Chocolate milk or cocoa made with skim or low-fat (1%) milk. Nonfat or low-fat yogurt.  Avoid: Whole milk cheeses, including colby, cheddar, muenster, Monterey Jack, Havarti, Brie, Camembert, American, Swiss, and blue. Creamed cottage cheese, cream cheese. Whole milk and whole milk products, including buttermilk or yogurt made from whole milk, drinks made from whole milk. Condensed milk, evaporated whole milk, and 2% milk. Soups and Combination Foods  Allowed: Low-fat low-sodium soups: broth, dehydrated soups, homemade broth, soups with the fat removed, homemade cream soups made with skim or low-fat milk. Low-fat spaghetti, lasagna, chili, and Spanish rice if low-fat ingredients and low-fat cooking techniques are used.  Avoid: Cream soups made with whole milk, cream, or high-fat cheese. All other soups. Desserts and Sweets  Allowed: Sherbet, fruit ices, gelatins, meringues, and angel food cake. Homemade desserts with recommended fats, oils, and milk products. Jam, jelly, honey, marmalade, sugars, and syrups. Pure sugar candy, such as gum drops, hard candy, jelly beans, marshmallows, mints, and small amounts of dark chocolate.  Avoid: Commercially prepared   cakes, pies, cookies, frosting, pudding, or mixes for these products. Desserts containing whole milk products, chocolate, coconut, lard, palm oil, or palm kernel oil. Ice cream or ice cream drinks. Candy that contains chocolate, coconut, butter, hydrogenated fat, or unknown ingredients. Buttered syrups. Fats and Oils  Allowed: Vegetable oils: safflower, sunflower, corn, soybean, cottonseed, sesame, canola, olive,  or peanut. Non-hydrogenated margarines. Salad dressing or mayonnaise: homemade or commercial, made with a recommended oil. Low or nonfat salad dressing or mayonnaise.  Limit added fats and oils to 6 to 8 tsp per day (includes fats used in cooking, baking, salads, and spreads on bread). Remember to count the "hidden fats" in foods.  Avoid: Solid fats and shortenings: butter, lard, salt pork, bacon drippings. Gravy containing meat fat, shortening, or suet. Cocoa butter, coconut. Coconut oil, palm oil, palm kernel oil, or hydrogenated oils: these ingredients are often used in bakery products, nondairy creamers, whipped toppings, candy, and commercially fried foods. Read labels carefully. Salad dressings made of unknown oils, sour cream, or cheese, such as blue cheese and Roquefort. Cream, all kinds: half-and-half, light, heavy, or whipping. Sour cream or cream cheese (even if "light" or low-fat). Nondairy cream substitutes: coffee creamers and sour cream substitutes made with palm, palm kernel, hydrogenated oils, or coconut oil. Beverages  Allowed: Coffee (regular or decaffeinated), tea. Diet carbonated beverages, mineral water. Alcohol: Check with your caregiver. Moderation is recommended.  Avoid: Whole milk, regular sodas, and juice drinks with added sugar. Condiments  Allowed: All seasonings and condiments. Cocoa powder. "Cream" sauces made with recommended ingredients.  Avoid: Carob powder made with hydrogenated fats. SAMPLE MENU Breakfast   cup orange juice   cup oatmeal  1 slice toast  1 tsp margarine  1 cup skim milk Lunch  [place] sandwich with 2 oz [place], 2 slices bread  Lettuce and tomato slices  Fresh fruit  Carrot sticks  Coffee or tea Snack  Fresh fruit or low-fat crackers Dinner  3 oz lean ground beef  1 baked potato  1 tsp margarine   cup asparagus  Lettuce salad  1 tbs non-creamy dressing   cup peach slices  1 cup skim milk Document Released:  02/29/2008 Document Revised: 11/21/2011 Document Reviewed: 07/22/2013 [employer] Patient Information 2015 [place]. This information is not intended to replace advice given to you by your health care provider. Make sure you discuss any questions you have with your health care provider.  Medication Instructions:  1) INCREASE your Lipitor to 40mg  daily  Labwork: Your physician recommends that you return for lab work in: 6 weeks ( LFT, Lipids)   Testing/Procedures: Your physician has requested that you have an echocardiogram after December 08, 2014. Echocardiography is a painless test that uses sound waves to create images of your heart. It provides your doctor with information about the size and shape of your heart and how well your heart's chambers and valves are working. This procedure takes approximately one hour. There are no restrictions for this procedure.   Follow-Up: Your physician recommends that you schedule a follow-up appointment in: 4 weeks with Dr. Jomarie Longsroituru   Any Other Special Instructions Will Be Listed Below (If Applicable).  PLEASE MAKE SURE THAT YOU INFORM ANY DOCTOR THAT PRESCRIBES YOU MEDICATIONS THAT YOU HAVE PROLONGED QT ON A EKG.

## 2014-09-28 NOTE — Progress Notes (Signed)
Cardiology Office Note   Date:  09/28/2014   ID:  Sheri Dunn, DOB Jul 15, 1950, MRN [id number]  PCP:  Alva GarnetSHELTON,KIMBERLY R., MD  Cardiologist:  Dr. Thurmon FairMihai Croitoru     Chief Complaint  Patient presents with  . Coronary Artery Disease    s/p NSTEMI >> tx with DES to CFX and DES to RCA  . Cardiomyopathy  . Hospitalization Follow-up     History of Present Illness: Sheri CurlsCarolyn Chamberland is a 64 y.o. female with a hx of HTN, HL. Admitted 4/2-4/6 with hypertensive urgency, acute systolic CHF and non-STEMI. She was diuresed. Echocardiogram demonstrated an EF of 25-30%. LHC demonstrated significant 2 vessel CAD. She underwent DES to the mid circumflex and DES to the distal RCA.  Due to shortness of breath, Brilinta was changed to Effient prior to DC.  Tele demonstrated PVCs but no sustained arrhythmia.  She was noted to have a prolonged QT on ECG.  She was placed on beta-blocker and angiotensin receptor blocker.  She returns for FU.  She is doing well. She denies chest discomfort. She is walking about 15-20 minutes a day. She denies significant dyspnea. Overall, she is NYHA 2-2b. She denies orthopnea, PND or edema. She denies syncope.   Studies/Reports Reviewed Today:  LHC/PCI 09/08/14 LM:  widely patent. LAD:   focal area of mild to moderate disease in the mid vessel.  LCx:   focal 90% stenosis in the mid circumflex.  RCA:   distal 90%.  EF 20%.  LVEDP was 10 mmHg. PCI:   3.5 x 16 Synergy DES to mid LCx;   3.0 x 16 Synergy DES to distal RCA  Echo 09/07/14 - Mild concentric hypertrophy. EF 25% to 30%. Diffuse hypokinesis. Moderate hypokinesis of the mid-apicalanteroseptal myocardium. Grade 1 diastolic dysfunction). - Aortic valve: There was moderate regurgitation. - Left atrium: The atrium was moderately dilated. - Right ventricle: The cavity size was mildly dilated.    Past Medical History  Diagnosis Date  . Hypertension   . HLD (hyperlipidemia)   . CAD (coronary artery disease)     a.  NSTEMI 4/16:  LHC - mCFX 90, dRCA 90, EF 20% >> PCI: Synergy DES to mCFX and Synergy DES to dRCA  . Ischemic cardiomyopathy     a. Echo 4/16:  EF 25-30%, diff HK, ant-septal HK, Gr 1 DD, mod AI, mod LAE, mild RVE    Past Surgical History  Procedure Laterality Date  . Left heart catheterization with coronary angiogram N/A 09/08/2014    Procedure: LEFT HEART CATHETERIZATION WITH CORONARY ANGIOGRAM;  Surgeon: Corky CraftsJayadeep S Varanasi, MD;  [facility];  Service: Cardiovascular;  Laterality: N/A;  . Cardiac catheterization Right 09/08/2014    Procedure: CORONARY STENT INTERVENTION;  Surgeon: Corky CraftsJayadeep S Varanasi, MD;  [facility];  Service: Cardiovascular;  Laterality: Right;     Current Outpatient Prescriptions  Medication Sig Dispense Refill  . aspirin 81 MG chewable tablet Chew 1 tablet (81 mg total) by mouth daily. 30 tablet 3  . carvedilol (COREG) 6.25 MG tablet Take 1 tablet (6.25 mg total) by mouth 2 (two) times daily with a meal. 60 tablet 3  . Cholecalciferol (VITAMIN D3) 2000 UNITS TABS Take 2,000 Units by mouth daily.    . prasugrel (EFFIENT) 10 MG TABS tablet Take 1 tablet (10 mg total) by mouth daily. 30 tablet 0  . valsartan (DIOVAN) 80 MG tablet Take 80 mg by mouth daily.  0  . atorvastatin (LIPITOR) 40 MG tablet Take  1 tablet (40 mg total) by mouth daily. 90 tablet 3   No current facility-administered medications for this visit.    Allergies:   Review of patient's allergies indicates no known allergies.    Social History:  The patient  reports that she has never smoked. She does not have any smokeless tobacco history on file. She reports that she does not drink alcohol or use illicit drugs.   Family History:  The patient's family history includes Heart attack in her mother; Stroke in her mother.    ROS:   Please see the history of present illness.   Review of Systems  Constitution: Positive for decreased appetite and weight gain.  Cardiovascular: Positive  for irregular heartbeat.  Respiratory: Positive for cough.   All other systems reviewed and are negative.     PHYSICAL EXAM: VS:  BP 110/80 mmHg  Pulse 76  Ht  (1.676 m)  Wt 133 lb (60.328 kg)  BMI 21.48 kg/m2    Wt Readings from Last 3 Encounters:  09/28/14 133 lb (60.328 kg)  09/09/14 124 lb 1.9 oz (56.3 kg)  03/09/13 138 lb (62.596 kg)     GEN: Well nourished, well developed, in no acute distress HEENT: normal Neck: no JVD,  no masses Cardiac:  Normal S1/S2, RRR; no murmur ,  no rubs or gallops, no edema; R groin without hematoma or bruit  Respiratory:  clear to auscultation bilaterally, no wheezing, rhonchi or rales. GI: soft, nontender, nondistended, + BS MS: no deformity or atrophy Skin: warm and dry  Neuro:  CNs II-XII intact, Strength and sensation are intact Psych: Normal affect   EKG:  EKG is ordered today.  It demonstrates:   NSR, HR 76, normal axis, inferior and anterolateral T-wave inversions, PVCs, no significant change when compared to prior tracing, QTc 488   Recent Labs: 09/05/2014: B Natriuretic Peptide 4342.6*; TSH 1.940 09/08/2014: Magnesium 2.1 09/09/2014: ALT 93*; BUN 19; Creatinine 0.76; Hemoglobin 13.9; Platelets 203; Potassium 4.3; Sodium 136    Lipid Panel    Component Value Date/Time   CHOL 141 09/09/2014 0334   TRIG 86 09/09/2014 0334   HDL 41 09/09/2014 0334   CHOLHDL 3.4 09/09/2014 0334   VLDL 17 09/09/2014 0334   LDLCALC 83 09/09/2014 0334      ASSESSMENT AND PLAN:  Coronary artery disease involving native coronary artery of native heart without angina pectoris. She is progressing well after her recent non-STEMI and 2 vessel PCI with a Synergy DES to the CFX and RCA.  She is not interested in formal cardiac rehab.  She will contact us if she changes her mind.  She does not do a job with heavy exertion.  She trims PVC fittings.  I will give her a note to go back to work next week.  We discussed the importance of dual antiplatelet  therapy.  She will continue ASA, statin, beta-blocker, angiotensin receptor blocker, Effient.  Chronic systolic CHF (congestive heart failure) Volume stable.  She is not on diuretics.  She is NYHA 2-2b.    Ischemic cardiomyopathy Continue ACE inhibitor and beta-blocker.  BP will not likely tolerated further titration of medications.  Arrange FU echo 90 days post MI/PCI.    Essential hypertension Controlled.   Hyperlipidemia Increase Lipitor to 40 mg QD for moderate intensity Rx.  Check Lipids and LFTs in 6 weeks. .   QT prolongation We discussed the importance of avoiding QT prolonging drugs.   Aortic insufficiency  This will be reassessed  at her FU echo.   Current medicines are reviewed at length with the patient today.  Concerns regarding medicines are as outlined above.  The following changes have been made:    Increase Lipitor to 40 mg QD.    Labs/ tests ordered today include:  Orders Placed This Encounter  Procedures  . Hepatic function panel  . Lipid Profile  . EKG 12-Lead  . 2D Echocardiogram without contrast    Disposition:   FU with Dr. Rachelle Hora Croitoru 4 weeks.    Signed, Brynda Rim, MHS 09/28/2014 5:04 PM    [facility] [address] [place], [place], [place]  [address] Phone: [phone]; Fax: [phone]

## 2014-10-06 ENCOUNTER — Encounter: Payer: Self-pay | Admitting: Cardiology

## 2014-10-06 ENCOUNTER — Ambulatory Visit (INDEPENDENT_AMBULATORY_CARE_PROVIDER_SITE_OTHER): Payer: PRIVATE HEALTH INSURANCE | Admitting: Cardiology

## 2014-10-06 ENCOUNTER — Other Ambulatory Visit: Payer: Self-pay | Admitting: *Deleted

## 2014-10-06 ENCOUNTER — Telehealth: Payer: Self-pay | Admitting: Cardiovascular Disease

## 2014-10-06 VITALS — BP 140/78 | HR 79 | Ht 66.0 in | Wt 140.0 lb

## 2014-10-06 DIAGNOSIS — I351 Nonrheumatic aortic (valve) insufficiency: Secondary | ICD-10-CM

## 2014-10-06 DIAGNOSIS — I255 Ischemic cardiomyopathy: Secondary | ICD-10-CM

## 2014-10-06 DIAGNOSIS — Z9861 Coronary angioplasty status: Secondary | ICD-10-CM

## 2014-10-06 DIAGNOSIS — I2510 Atherosclerotic heart disease of native coronary artery without angina pectoris: Secondary | ICD-10-CM | POA: Diagnosis not present

## 2014-10-06 DIAGNOSIS — I10 Essential (primary) hypertension: Secondary | ICD-10-CM

## 2014-10-06 DIAGNOSIS — E785 Hyperlipidemia, unspecified: Secondary | ICD-10-CM

## 2014-10-06 DIAGNOSIS — R079 Chest pain, unspecified: Secondary | ICD-10-CM | POA: Diagnosis not present

## 2014-10-06 DIAGNOSIS — R0789 Other chest pain: Secondary | ICD-10-CM

## 2014-10-06 DIAGNOSIS — I2583 Coronary atherosclerosis due to lipid rich plaque: Principal | ICD-10-CM

## 2014-10-06 HISTORY — DX: Chest pain, unspecified: R07.9

## 2014-10-06 MED ORDER — CARVEDILOL 12.5 MG PO TABS: 12.5000 mg | ORAL_TABLET | Freq: Two times a day (BID) | ORAL | Status: DC

## 2014-10-06 MED ORDER — PRASUGREL HCL 10 MG PO TABS: 10.0000 mg | ORAL_TABLET | Freq: Every day | ORAL | Status: DC

## 2014-10-06 MED ORDER — CARVEDILOL 6.25 MG PO TABS: 6.2500 mg | ORAL_TABLET | Freq: Two times a day (BID) | ORAL | Status: DC

## 2014-10-06 NOTE — Telephone Encounter (Signed)
Pt c/o of Back pain  1. Are you having Back pain right now? Yes  2. Are you experiencing any other symptoms (ex. SOB, nausea, vomiting, sweating)? No  3. How long have you been experiencing Back pain. Since Sunday  10/04/2014 4. Is your CP continuous or coming and going? Continuous  5. Have you taken Nitroglycerin? No   Comments: Pt called states that she is having a burning sensation on her Left side and on her back has had a Stent put in recently 09/08/2014. Requests a call back for a same day appt

## 2014-10-06 NOTE — Assessment & Plan Note (Signed)
Continue ARB. Advance beta blocker. Once medications titrated and blood pressure controlled would repeat echocardiogram. Ejection fraction less than 35% would need to consider ICD.

## 2014-10-06 NOTE — Assessment & Plan Note (Signed)
Blood pressure has improved but remains mildly elevated. Change carvedilol to 12.5 mg by mouth twice a day.

## 2014-10-06 NOTE — Assessment & Plan Note (Signed)
Continue aspirin, statin and effient. 

## 2014-10-06 NOTE — Telephone Encounter (Signed)
Pt. Complaining of back pain and a burning sensation on her left side, had recent stent placement, Denies CP, SOB pt. Placed on DOD sched to see Dr. Jens Somrenshaw @ 3:45 today

## 2014-10-06 NOTE — Assessment & Plan Note (Signed)
Patient has left lower rib pain. There is some improvement with pressure. Question musculoskeletal. There is no hematuria or dysuria to suggest nephrolithiasis. Her pain is not consistent with cardiac etiology. Have asked her to try Tylenol and heat pads for the next 1-2 days. If pain persists I have asked her to follow-up with primary care for further evaluation.

## 2014-10-06 NOTE — Assessment & Plan Note (Signed)
She will need follow-up echoes in the future. 

## 2014-10-06 NOTE — Assessment & Plan Note (Signed)
Continue statin. 

## 2014-10-06 NOTE — Patient Instructions (Signed)
Your physician recommends that you schedule a follow-up appointment in: AS SCHEDULED  INCREASE CARVEDILOL TO 12.5 MG TWICE DAILY= 2 OF THE 6.25 MG TABLETS TWICE DAILY

## 2014-10-06 NOTE — Progress Notes (Signed)
HPI: FU CAD. Admitted 4/15 with hypertensive urgency, acute systolic CHF and non-STEMI. She was diuresed. Echocardiogram demonstrated an EF of 25-30%. LHC demonstrated significant 2 vessel CAD. She underwent DES to the mid circumflex and DES to the distal RCA. Due to shortness of breath, Brilinta was changed to Effient prior to DC. Tele demonstrated PVCs but no sustained arrhythmia. She was noted to have a prolonged QT on ECG. She was placed on beta-blocker and angiotensin receptor blocker. patient presents as an add-on today. She has complained of pain in the left upper rib area since Sunday. The pain has been intermittent and described as a burning pain. It is not pleuritic, positional or exertional. No associated symptoms. She denies fevers, chills, melena, hematuria or dysuria.   Studies/Reports Reviewed Today:  LHC/PCI 09/08/14 LM: widely patent. LAD: focal area of mild to moderate disease in the mid vessel.  LCx: focal 90% stenosis in the mid circumflex.  RCA: distal 90%.  EF 20%. LVEDP was 10 mmHg. PCI: 3.5 x 16 Synergy DES to mid LCx; 3.0 x 16 Synergy DES to distal RCA  Echo 09/07/14 - Mild concentric hypertrophy. EF 25% to 30%. Diffuse hypokinesis. Moderate hypokinesis of themid-apicalanteroseptal myocardium. Grade 1diastolic dysfunction). - Aortic valve: There was moderate regurgitation. - Left atrium: The atrium was moderately dilated.  Current Outpatient Prescriptions  Medication Sig Dispense Refill  . aspirin 81 MG chewable tablet Chew 1 tablet (81 mg total) by mouth daily. 30 tablet 3  . atorvastatin (LIPITOR) 40 MG tablet Take 1 tablet (40 mg total) by mouth daily. 90 tablet 3  . Cholecalciferol (VITAMIN D3) 2000 UNITS TABS Take 2,000 Units by mouth daily.    . valsartan (DIOVAN) 80 MG tablet Take 80 mg by mouth daily.  0  . carvedilol (COREG) 6.25 MG tablet Take 1 tablet (6.25 mg total) by mouth 2 (two) times daily with a meal. 60 tablet 6  .  prasugrel (EFFIENT) 10 MG TABS tablet Take 1 tablet (10 mg total) by mouth daily. 30 tablet 6   No current facility-administered medications for this visit.     Past Medical History  Diagnosis Date  . Hypertension   . HLD (hyperlipidemia)   . CAD (coronary artery disease)     a. NSTEMI 4/16:  LHC - mCFX 90, dRCA 90, EF 20% >> PCI: Synergy DES to mCFX and Synergy DES to dRCA  . Ischemic cardiomyopathy     a. Echo 4/16:  EF 25-30%, diff HK, ant-septal HK, Gr 1 DD, mod AI, mod LAE, mild RVE  . Chronic systolic CHF (congestive heart failure)   . Prolonged Q-T interval on ECG     Past Surgical History  Procedure Laterality Date  . Left heart catheterization with coronary angiogram N/A 09/08/2014    Procedure: LEFT HEART CATHETERIZATION WITH CORONARY ANGIOGRAM;  Surgeon: Corky CraftsJayadeep S Varanasi, MD;  [facility];  Service: Cardiovascular;  Laterality: N/A;  . Cardiac catheterization Right 09/08/2014    Procedure: CORONARY STENT INTERVENTION;  Surgeon: Corky CraftsJayadeep S Varanasi, MD;  [facility];  Service: Cardiovascular;  Laterality: Right;    History   Social History  . Marital Status: Single    Spouse Name: N/A  . Number of Children: N/A  . Years of Education: N/A   Occupational History  . Not on file.   Social History Main Topics  . Smoking status: Never Smoker   . Smokeless tobacco: Not on file  . Alcohol Use: No  .  Drug Use: No  . Sexual Activity: No   Other Topics Concern  . Not on file   Social History Narrative    ROS: no fevers or chills, productive cough, hemoptysis, dysphasia, odynophagia, melena, hematochezia, dysuria, hematuria, rash, seizure activity, orthopnea, PND, pedal edema, claudication. Remaining systems are negative.  Physical Exam: Well-developed well-nourished in no acute distress.  Skin is warm and dry.  HEENT is normal.  Neck is supple.  Chest is clear to auscultation with normal expansion.  Cardiovascular exam is regular rate and  rhythm.  Abdominal exam nontender or distended. No masses palpated. Extremities show no edema. neuro grossly intact  ECG Sinus rhythm, PVC, biatrial enlargement; LVH with repol, prolonged QT

## 2014-10-07 ENCOUNTER — Telehealth: Payer: Self-pay | Admitting: Cardiology

## 2014-10-07 ENCOUNTER — Encounter (HOSPITAL_COMMUNITY): Payer: Self-pay | Admitting: *Deleted

## 2014-10-07 ENCOUNTER — Emergency Department (HOSPITAL_COMMUNITY)
Admission: EM | Admit: 2014-10-07 | Discharge: 2014-10-08 | Disposition: A | Discharge status: Home / Self Care | Payer: PRIVATE HEALTH INSURANCE | Attending: Emergency Medicine | Admitting: Emergency Medicine

## 2014-10-07 ENCOUNTER — Emergency Department (HOSPITAL_COMMUNITY): Payer: PRIVATE HEALTH INSURANCE

## 2014-10-07 DIAGNOSIS — Z9889 Other specified postprocedural states: Secondary | ICD-10-CM | POA: Diagnosis not present

## 2014-10-07 DIAGNOSIS — I5022 Chronic systolic (congestive) heart failure: Secondary | ICD-10-CM | POA: Insufficient documentation

## 2014-10-07 DIAGNOSIS — E785 Hyperlipidemia, unspecified: Secondary | ICD-10-CM | POA: Diagnosis not present

## 2014-10-07 DIAGNOSIS — I2510 Atherosclerotic heart disease of native coronary artery without angina pectoris: Secondary | ICD-10-CM | POA: Insufficient documentation

## 2014-10-07 DIAGNOSIS — R630 Anorexia: Secondary | ICD-10-CM | POA: Insufficient documentation

## 2014-10-07 DIAGNOSIS — I10 Essential (primary) hypertension: Secondary | ICD-10-CM | POA: Diagnosis not present

## 2014-10-07 DIAGNOSIS — R109 Unspecified abdominal pain: Secondary | ICD-10-CM | POA: Diagnosis present

## 2014-10-07 DIAGNOSIS — Z7982 Long term (current) use of aspirin: Secondary | ICD-10-CM | POA: Diagnosis not present

## 2014-10-07 DIAGNOSIS — Z79899 Other long term (current) drug therapy: Secondary | ICD-10-CM | POA: Insufficient documentation

## 2014-10-07 LAB — COMPREHENSIVE METABOLIC PANEL
ALBUMIN: 3.8 g/dL (ref 3.5–5.0)
ALK PHOS: 89 U/L (ref 38–126)
ALT: 56 U/L — AB (ref 14–54)
AST: 32 U/L (ref 15–41)
Anion gap: 11 (ref 5–15)
BILIRUBIN TOTAL: 0.8 mg/dL (ref 0.3–1.2)
BUN: 24 mg/dL — ABNORMAL HIGH (ref 6–20)
CO2: 20 mmol/L — ABNORMAL LOW (ref 22–32)
Calcium: 9.7 mg/dL (ref 8.9–10.3)
Chloride: 109 mmol/L (ref 101–111)
Creatinine, Ser: 0.8 mg/dL (ref 0.44–1.00)
GFR calc Af Amer: >60 mL/min (ref >60)
GFR calc non Af Amer: >60 mL/min (ref >60)
Glucose, Bld: 135 mg/dL — ABNORMAL HIGH (ref 70–99)
POTASSIUM: 4.4 mmol/L (ref 3.5–5.1)
Sodium: 140 mmol/L (ref 135–145)
Total Protein: 7.3 g/dL (ref 6.5–8.1)

## 2014-10-07 LAB — CBC WITH DIFFERENTIAL/PLATELET
BASOS PCT: 0 % (ref 0–1)
Basophils Absolute: 0 10*3/uL (ref 0.0–0.1)
EOS ABS: 0 10*3/uL (ref 0.0–0.7)
Eosinophils Relative: 0 % (ref 0–5)
HEMATOCRIT: 45.2 % (ref 36.0–46.0)
HEMOGLOBIN: 14.8 g/dL (ref 12.0–15.0)
LYMPHS ABS: 1.4 10*3/uL (ref 0.7–4.0)
LYMPHS PCT: 23 % (ref 12–46)
MCH: 28.8 pg (ref 26.0–34.0)
MCHC: 32.7 g/dL (ref 30.0–36.0)
MCV: 87.9 fL (ref 78.0–100.0)
MONO ABS: 0.5 10*3/uL (ref 0.1–1.0)
MONOS PCT: 8 % (ref 3–12)
NEUTROS ABS: 4.1 10*3/uL (ref 1.7–7.7)
Neutrophils Relative %: 69 % (ref 43–77)
PLATELETS: 143 10*3/uL — AB (ref 150–400)
RBC: 5.14 MIL/uL — ABNORMAL HIGH (ref 3.87–5.11)
RDW: 14.7 % (ref 11.5–15.5)
WBC: 6 10*3/uL (ref 4.0–10.5)

## 2014-10-07 LAB — URINALYSIS, ROUTINE W REFLEX MICROSCOPIC
BILIRUBIN URINE: NEGATIVE
Glucose, UA: NEGATIVE mg/dL
HGB URINE DIPSTICK: NEGATIVE
KETONES UR: 15 mg/dL — AB
Leukocytes, UA: NEGATIVE
Nitrite: NEGATIVE
PROTEIN: 100 mg/dL — AB
Specific Gravity, Urine: 1.044 — ABNORMAL HIGH (ref 1.005–1.030)
UROBILINOGEN UA: 0.2 mg/dL (ref 0.0–1.0)
pH: 5 (ref 5.0–8.0)

## 2014-10-07 LAB — URINE MICROSCOPIC-ADD ON

## 2014-10-07 LAB — LIPASE, BLOOD: Lipase: 25 U/L (ref 22–51)

## 2014-10-07 NOTE — ED Notes (Signed)
Lt lat upper abd just below the lt ribs

## 2014-10-07 NOTE — Telephone Encounter (Signed)
New message  Pt called states that she doesn't have an appetite after the stent was put in and she is req a call back to determine if it is ok for her to have ensure. Please call

## 2014-10-07 NOTE — ED Notes (Signed)
The pt is c/o lt abd pain since Sunday  No n v or diarhea

## 2014-10-07 NOTE — Telephone Encounter (Signed)
Spoke with pt, since she has started with the salt free diet she has no appetite. Okay given for patient to drink ensure, but cautioned her about the sodium content. Patient voiced understanding

## 2014-10-07 NOTE — ED Notes (Signed)
Pt reports pain in her left side that radiates to her back, pain has been present since Sunday. Denies any urinary symptoms, n/v, fevers. Pt alert, oriented, nad.

## 2014-10-07 NOTE — ED Provider Notes (Signed)
CSN: [id number]     Arrival date & time 10/07/14  2018 History   First MD Initiated Contact with Patient 10/07/14 2229     Chief Complaint  Patient presents with  . Abdominal Pain   Patient is a 64 y.o. female presenting with abdominal pain. The history is provided by the patient.  Abdominal Pain Pain location:  L flank Pain quality: sharp   Pain severity:  Severe Onset quality:  Gradual Duration:  4 days Timing:  Constant Chronicity:  New Relieved by:  Nothing Worsened by:  Nothing tried Associated symptoms: anorexia   Associated symptoms: no cough, no diarrhea, no dysuria, no fatigue, no fever, no shortness of breath and no vomiting    patient saw her cardiologist yesterday and was told her symptoms are most likely related to a muscle strain. To be cardiac in nature. She was taking over-the-counter Tylenol however she could not get comfortable so she came into the emergency room today.  Past Medical History  Diagnosis Date  . Hypertension   . HLD (hyperlipidemia)   . CAD (coronary artery disease)     a. NSTEMI 4/16:  LHC - mCFX 90, dRCA 90, EF 20% >> PCI: Synergy DES to mCFX and Synergy DES to dRCA  . Ischemic cardiomyopathy     a. Echo 4/16:  EF 25-30%, diff HK, ant-septal HK, Gr 1 DD, mod AI, mod LAE, mild RVE  . Chronic systolic CHF (congestive heart failure)   . Prolonged Q-T interval on ECG    Past Surgical History  Procedure Laterality Date  . Left heart catheterization with coronary angiogram N/A 09/08/2014    Procedure: LEFT HEART CATHETERIZATION WITH CORONARY ANGIOGRAM;  Surgeon: Corky Crafts, MD;  Location[facility] CATH LAB;  Service: Cardiovascular;  Laterality: N/A;  . Cardiac catheterization Right 09/08/2014    Procedure: CORONARY STENT INTERVENTION;  Surgeon: Corky Crafts, MD;  Location: [facility] CATH LAB;  Service: Cardiovascular;  Laterality: Right;   Family History  Problem Relation Age of Onset  . Heart attack Mother   . Stroke Mother    History   Substance Use Topics  . Smoking status: Never Smoker   . Smokeless tobacco: Not on file  . Alcohol Use: No   OB History    No data available     Review of Systems  Constitutional: Negative for fever and fatigue.  Respiratory: Negative for cough and shortness of breath.   Gastrointestinal: Positive for abdominal pain and anorexia. Negative for vomiting and diarrhea.  Genitourinary: Negative for dysuria.  All other systems reviewed and are negative.     Allergies  Review of patient's allergies indicates no known allergies.  Home Medications   Prior to Admission medications   Medication Sig Start Date End Date Taking? Authorizing Provider  aspirin 81 MG chewable tablet Chew 1 tablet (81 mg total) by mouth daily. 09/09/14   Nishant Dhungel, MD  atorvastatin (LIPITOR) 40 MG tablet Take 1 tablet (40 mg total) by mouth daily. 09/28/14   Beatrice Lecher, PA-C  carvedilol (COREG) 12.5 MG tablet Take 1 tablet (12.5 mg total) by mouth 2 (two) times daily with a meal. 10/06/14   Lewayne Bunting, MD  Cholecalciferol (VITAMIN D3) 2000 UNITS TABS Take 2,000 Units by mouth daily.    Historical Provider, MD  prasugrel (EFFIENT) 10 MG TABS tablet Take 1 tablet (10 mg total) by mouth daily. 10/06/14   Lewayne Bunting, MD  valsartan (DIOVAN) 80 MG tablet Take 80 mg by  mouth daily. 08/27/14   Historical Provider, MD   BP 144/67 mmHg  Pulse 74  Temp(Src) 97.9 F (36.6 C) (Oral)  Resp 22  Wt 131 lb 7 oz (59.62 kg)  SpO2 99% Physical Exam  Constitutional: She appears well-developed and well-nourished. No distress.  HENT:  Head: Normocephalic and atraumatic.  Right Ear: External ear normal.  Left Ear: External ear normal.  Eyes: Conjunctivae are normal. Right eye exhibits no discharge. Left eye exhibits no discharge. No scleral icterus.  Neck: Neck supple. No tracheal deviation present.  Cardiovascular: Normal rate, regular rhythm and intact distal pulses.   Pulmonary/Chest: Effort normal and  breath sounds normal. No stridor. No respiratory distress. She has no wheezes. She has no rales.  Abdominal: Soft. Bowel sounds are normal. She exhibits no distension. There is no tenderness. There is CVA tenderness (left). There is no rebound and no guarding.  Musculoskeletal: She exhibits no edema or tenderness.  Pain with movement, discrete area of tenderness palpation in the left flank area  Neurological: She is alert. She has normal strength. No cranial nerve deficit (no facial droop, extraocular movements intact, no slurred speech) or sensory deficit. She exhibits normal muscle tone. She displays no seizure activity. Coordination normal.  Skin: Skin is warm and dry. No rash noted.  No vesicular rash  Psychiatric: She has a normal mood and affect.  Nursing note and vitals reviewed.   ED Course  Procedures (including critical care time) Labs Review Labs Reviewed  CBC WITH DIFFERENTIAL/PLATELET - Abnormal; Notable for the following:    RBC 5.14 (*)    Platelets 143 (*)    All other components within normal limits  COMPREHENSIVE METABOLIC PANEL - Abnormal; Notable for the following:    CO2 20 (*)    Glucose, Bld 135 (*)    BUN 24 (*)    ALT 56 (*)    All other components within normal limits  URINALYSIS, ROUTINE W REFLEX MICROSCOPIC - Abnormal; Notable for the following:    Color, Urine AMBER (*)    Specific Gravity, Urine 1.044 (*)    Ketones, ur 15 (*)    Protein, ur 100 (*)    All other components within normal limits  URINE MICROSCOPIC-ADD ON - Abnormal; Notable for the following:    Casts HYALINE CASTS (*)    All other components within normal limits  LIPASE, BLOOD    Imaging Review Ct Abdomen Pelvis Wo Contrast  10/08/2014   CLINICAL DATA:  Severe left flank pain for 3 days.  EXAM: CT ABDOMEN AND PELVIS WITHOUT CONTRAST  TECHNIQUE: Multidetector CT imaging of the abdomen and pelvis was performed following the standard protocol without IV contrast.  COMPARISON:  None.   FINDINGS: There are no urinary calculi. There is no hydronephrosis or ureteral dilatation.  Bowel is remarkable only for uncomplicated colonic diverticulosis. There is no bowel obstruction. There is a short length of ileum within a right inguinal hernia, nonobstructing.  There are unremarkable unenhanced appearances of the liver, spleen, pancreas, adrenals and kidneys.  The abdominal aorta is normal in caliber with mild atherosclerotic calcification.  There is no significant abnormality in the lower chest.  No significant musculoskeletal abnormality is evident.  Calcified uterine fibroids incidentally noted.  IMPRESSION: *No acute findings are evident in the abdomen or pelvis. *Right inguinal hernia containing unobstructed ileum *Colonic diverticulosis *Calcified uterine fibroids   Electronically Signed   By: Ellery Plunkaniel R Mitchell M.D.   On: 10/08/2014 00:15   Dg Chest 2 View  10/07/2014   CLINICAL DATA:  Left lower back pain.  EXAM: CHEST  2 VIEW  COMPARISON:  CT 09/05/2014  FINDINGS: Cardiac silhouette is enlarged. Resolution of the pleural fluid seen on comparison CT. No focal infiltrate. No pulmonary edema. No pneumothorax.  IMPRESSION: No acute cardiopulmonary process.  Resolution of pleural effusions seen on comparison CT   Electronically Signed   By: Genevive BiStewart  Edmunds M.D.   On: 10/07/2014 23:24     EKG Interpretation   Date/Time:  Wednesday Oct 07 2014 20:24:58 EDT Ventricular Rate:  91 PR Interval:  146 QRS Duration: 80 QT Interval:  380 QTC Calculation: 467 R Axis:   44 Text Interpretation:  Sinus rhythm with occasional Premature ventricular  complexes Biatrial enlargement Left ventricular hypertrophy with  repolarization abnormality Abnormal ECG similar to prior ecg Confirmed by  Torey Regan  MD-J, Ayvah Caroll ([id number]) on 10/07/2014 10:33:52 PM      MDM   Final diagnoses:  Flank pain    The patient's evaluation emergency Department does not demonstrate nephrolithiasis, aortic aneurysm, bowel  obstruction or any other acute surgical emergency. Her symptoms are most likely related to muscular strain.  Plan on discharge home with pain medications.    Linwood DibblesJon Kassadie Pancake, MD 10/08/14 [phone]

## 2014-10-08 MED ORDER — MORPHINE SULFATE 4 MG/ML IJ SOLN
4.0000 mg | Freq: Once | INTRAMUSCULAR | Status: AC
  Administered 2014-10-08: 4 mg via INTRAMUSCULAR
  Filled 2014-10-08: qty 1

## 2014-10-08 MED ORDER — MORPHINE SULFATE 4 MG/ML IJ SOLN: 4.0000 mg | Freq: Once | INTRAMUSCULAR | Status: DC

## 2014-10-08 MED ORDER — HYDROCODONE-ACETAMINOPHEN 5-325 MG PO TABS: 1.0000 | ORAL_TABLET | ORAL | Status: DC | PRN

## 2014-10-08 NOTE — Discharge Instructions (Signed)
Flank Pain °Flank pain refers to pain that is located on the side of the body between the upper abdomen and the back. The pain may occur over a short period of time (acute) or may be long-term or reoccurring (chronic). It may be mild or severe. Flank pain can be caused by many things. °CAUSES  °Some of the more common causes of flank pain include: °· Muscle strains.   °· Muscle spasms.   °· A disease of your spine (vertebral disk disease).   °· A lung infection (pneumonia).   °· Fluid around your lungs (pulmonary edema).   °· A kidney infection.   °· Kidney stones.   °· A very painful skin rash caused by the chickenpox virus (shingles).   °· Gallbladder disease.   °HOME CARE INSTRUCTIONS  °Home care will depend on the cause of your pain. In general, °· Rest as directed by your caregiver. °· Drink enough fluids to keep your urine clear or pale yellow. °· Only take over-the-counter or prescription medicines as directed by your caregiver. Some medicines may help relieve the pain. °· Tell your caregiver about any changes in your pain. °· Follow up with your caregiver as directed. °SEEK IMMEDIATE MEDICAL CARE IF:  °· Your pain is not controlled with medicine.   °· You have new or worsening symptoms. °· Your pain increases.   °· You have abdominal pain.   °· You have shortness of breath.   °· You have persistent nausea or vomiting.   °· You have swelling in your abdomen.   °· You feel faint or pass out.   °· You have blood in your urine. °· You have a fever or persistent symptoms for more than 2-3 days. °· You have a fever and your symptoms suddenly get worse. °MAKE SURE YOU:  °· Understand these instructions. °· Will watch your condition. °· Will get help right away if you are not doing well or get worse. °Document Released: 07/13/2005 Document Revised: 02/14/2012 Document Reviewed: 01/04/2012 °ExitCare® Patient Information ©2015 [employer]. This information is not intended to replace advice given to you by your  health care provider. Make sure you discuss any questions you have with your health care provider. ° °

## 2014-10-09 ENCOUNTER — Telehealth: Payer: Self-pay | Admitting: Cardiology

## 2014-10-09 NOTE — Telephone Encounter (Signed)
New message      Pt went to ER wed because she was "still" in pain.  She saw Dr Jens Somrenshaw on 10-06-14.  Son says she need to see Dr Jens Somrenshaw today.

## 2014-10-09 NOTE — Telephone Encounter (Signed)
Pt seen in ED 5/4. Was d/ced home w/ Rx for vicodin. Son reports she had taken some at home yesterday. Later in the evening, she was having slurring of speech, confusion.  EMS was called, she was eval'd on scene d/t initial concern for stroke. FAST, VS check, etc performed. Pt was not transported to hospital.  Son reports she is improved this AM after rest, sought advice.  Noted UA, other labwork from ED visit OK. I explained vicodin can be a cause of confusion & other SE's, d/c use. Advised return to ED if any worsening symptoms or concerning changes d/t acute nature of complaint. Caller voiced agreement & understanding w/ this plan.

## 2014-11-20 ENCOUNTER — Encounter: Payer: Self-pay | Admitting: Cardiovascular Disease

## 2014-11-20 ENCOUNTER — Ambulatory Visit (INDEPENDENT_AMBULATORY_CARE_PROVIDER_SITE_OTHER): Payer: PRIVATE HEALTH INSURANCE | Admitting: Cardiovascular Disease

## 2014-11-20 VITALS — BP 110/70 | HR 84 | Resp 16 | Ht 66.0 in | Wt 121.4 lb

## 2014-11-20 DIAGNOSIS — I214 Non-ST elevation (NSTEMI) myocardial infarction: Secondary | ICD-10-CM | POA: Diagnosis not present

## 2014-11-20 DIAGNOSIS — I25118 Atherosclerotic heart disease of native coronary artery with other forms of angina pectoris: Secondary | ICD-10-CM

## 2014-11-20 DIAGNOSIS — I10 Essential (primary) hypertension: Secondary | ICD-10-CM | POA: Diagnosis not present

## 2014-11-20 DIAGNOSIS — I5042 Chronic combined systolic (congestive) and diastolic (congestive) heart failure: Secondary | ICD-10-CM

## 2014-11-20 DIAGNOSIS — I255 Ischemic cardiomyopathy: Secondary | ICD-10-CM | POA: Diagnosis not present

## 2014-11-20 NOTE — Progress Notes (Signed)
Patient ID: Sheri Dunn, female   DOB: Dec 24, 1950, 64 y.o.   MRN: [id number]     Cardiology Office Note   Date:  11/20/2014   ID:  Sheri Dunn, DOB 24-Apr-1951, MRN [id number]  PCP:  No primary care provider on file.  Cardiologist:   Thurmon Fair, MD   Chief Complaint  Patient presents with  . Follow-up     no chest pain, shortness of breath-occassional, no edema, no pain in legs, no cramping in legs, no lightheadedness, no dizziness      History of Present Illness: Sheri Dunn is a 64 y.o. female who presents for follow-up roughly 2 months status post presentation with acute systolic heart failure and non-STEMI with placement of drug-eluting stents to the mid circumflex and distal right coronary artery and newly recognized severely depressed left ventricular systolic function.  She has steadily improved since her initial presentation and is back at work full-time in a fairly physical job. She has not had angina pectoris (at presentation she never really had chest pain but had intractable coughing). She has not had problems with lower extremity edema and denies syncope, palpitations, orthopnea or PND.  She has been very compliant with sodium restriction but finds it hard to eat a good diet without sodium. She has lost her appetite and has lost substantial weight. She is now frankly undernourished with a BMI of 19.6. Blood pressure control is excellent. She has not had any bleeding problems. She is compliant with dual antiplatelets therapy without fail.  Past Medical History  Diagnosis Date  . Hypertension   . HLD (hyperlipidemia)   . CAD (coronary artery disease)     a. NSTEMI 4/16:  LHC - mCFX 90, dRCA 90, EF 20% >> PCI: Synergy DES to mCFX and Synergy DES to dRCA  . Ischemic cardiomyopathy     a. Echo 4/16:  EF 25-30%, diff HK, ant-septal HK, Gr 1 DD, mod AI, mod LAE, mild RVE  . Chronic systolic CHF (congestive heart failure)   . Prolonged Q-T interval on ECG     Past  Surgical History  Procedure Laterality Date  . Left heart catheterization with coronary angiogram N/A 09/08/2014    Procedure: LEFT HEART CATHETERIZATION WITH CORONARY ANGIOGRAM;  Surgeon: Corky Crafts, MD;  Location: [facility] CATH LAB;  Service: Cardiovascular;  Laterality: N/A;  . Cardiac catheterization Right 09/08/2014    Procedure: CORONARY STENT INTERVENTION;  Surgeon: Corky Crafts, MD;  Location: [facility] CATH LAB;  Service: Cardiovascular;  Laterality: Right;     Current Outpatient Prescriptions  Medication Sig Dispense Refill  . aspirin 81 MG chewable tablet Chew 1 tablet (81 mg total) by mouth daily. 30 tablet 3  . atorvastatin (LIPITOR) 40 MG tablet Take 1 tablet (40 mg total) by mouth daily. 90 tablet 3  . carvedilol (COREG) 12.5 MG tablet Take 1 tablet (12.5 mg total) by mouth 2 (two) times daily with a meal. 180 tablet 3  . Cholecalciferol (VITAMIN D3) 2000 UNITS TABS Take 2,000 Units by mouth daily.    . prasugrel (EFFIENT) 10 MG TABS tablet Take 1 tablet (10 mg total) by mouth daily. 30 tablet 6  . valsartan (DIOVAN) 80 MG tablet Take 80 mg by mouth daily.  0   No current facility-administered medications for this visit.    Allergies:   Review of patient's allergies indicates no known allergies.    Social History:  The patient  reports that she has never smoked. She does not have any smokeless tobacco  history on file. She reports that she does not drink alcohol or use illicit drugs.   Family History:  The patient's family history includes Heart attack in her mother; Stroke in her mother.    ROS:  Please see the history of present illness.    Otherwise, review of systems positive for occasional dizziness.   All other systems are reviewed and negative.    PHYSICAL EXAM: VS:  BP 110/70 mmHg  Pulse 84  Resp 16  Ht  (1.676 m)  Wt 121 lb 6.4 oz (55.067 kg)  BMI 19.60 kg/m2 , BMI Body mass index is 19.6 kg/(m^2).  General: Alert, oriented x3, no distress Head:  no evidence of trauma, PERRL, EOMI, no exophtalmos or lid lag, no myxedema, no xanthelasma; normal ears, nose and oropharynx Neck: normal jugular venous pulsations and no hepatojugular reflux; brisk carotid pulses without delay and no carotid bruits Chest: clear to auscultation, no signs of consolidation by percussion or palpation, normal fremitus, symmetrical and full respiratory excursions Cardiovascular: normal position and quality of the apical impulse, regular rhythm, normal first and second heart sounds, no murmurs, rubs or gallops Abdomen: no tenderness or distention, no masses by palpation, no abnormal pulsatility or arterial bruits, normal bowel sounds, no hepatosplenomegaly Extremities: no clubbing, cyanosis or edema; 2+ radial, ulnar and brachial pulses bilaterally; 2+ right femoral, posterior tibial and dorsalis pedis pulses; 2+ left femoral, posterior tibial and dorsalis pedis pulses; no subclavian or femoral bruits Neurological: grossly nonfocal Psych: euthymic mood, full affect   EKG:  EKG is not ordered today.    Recent Labs: 09/05/2014: B Natriuretic Peptide 4342.6*; TSH 1.940 09/08/2014: Magnesium 2.1 10/07/2014: ALT 56*; BUN 24*; Creatinine, Ser 0.80; Hemoglobin 14.8; Platelets 143*; Potassium 4.4; Sodium 140    Lipid Panel    Component Value Date/Time   CHOL 141 09/09/2014 0334   TRIG 86 09/09/2014 0334   HDL 41 09/09/2014 0334   CHOLHDL 3.4 09/09/2014 0334   VLDL 17 09/09/2014 0334   LDLCALC 83 09/09/2014 0334      Wt Readings from Last 3 Encounters:  11/20/14 121 lb 6.4 oz (55.067 kg)  10/07/14 131 lb 7 oz (59.62 kg)  10/06/14 140 lb (63.504 kg)      ASSESSMENT AND PLAN:  1. Coronary artery disease status post non-STEMI and drug-eluting stents to the right coronary artery and left circumflex coronary artery. She does not have angina pectoris (but this was never prominent complaint). Her symptoms of exertional dyspnea have improved substantially. She  understands the need for 12 months of uninterrupted dual antiplatelets therapy. Her coronary risk factors seem to be well addressed.  2. Severe ischemic cardiomyopathy. Repeat assessment of left ventricular systolic function is scheduled on July 8 by echocardiography. If LVEF remains less than 35%, will recommend implantation of a single-chamber ICD. If that is the case, we'll bring her back for an earlier appointment. She is on fairly high doses of carvedilol and is also receiving an angiotensin receptor blocker. I don't think her blood pressure really allows much additional titration at this time.  3. Chronic heart failure, combined systolic and diastolic: she appears euvolemic despite the fact that she is not taking any diuretic. NYHA functional class I  4. Weight loss associated with anorexia. She does not appear to have other symptoms to suggest depression. We discussed the need for fluids at high nutritional content especially protein. I'm not sure whether her anorexia might be explained by her medications. She has labs scheduled for the  near future. We'll review these before deciding on adjusting medications.  5. Essential hypertension with excellent control  6. Aortic insufficiency: This is not really evident on physical exam today. Will reassess by echo  7. Hyperlipidemia, labs in near future   Current medicines are reviewed at length with the patient today.  The patient does not have concerns regarding medicines.  The following changes have been made:  no change  Labs/ tests ordered today include:  No orders of the defined types were placed in this encounter.   Patient Instructions  Dr. Royann Shivers recommends that you schedule a follow-up appointment in: October 2016.      Joie Bimler, MD  11/20/2014 5:48 PM    Thurmon Fair, MD, [facility] [phone] office [phone] pager

## 2014-11-20 NOTE — Patient Instructions (Addendum)
Dr. Royann Shivers recommends that you schedule a follow-up appointment in: October 2016.

## 2014-12-11 ENCOUNTER — Other Ambulatory Visit (INDEPENDENT_AMBULATORY_CARE_PROVIDER_SITE_OTHER): Payer: PRIVATE HEALTH INSURANCE | Admitting: *Deleted

## 2014-12-11 ENCOUNTER — Telehealth: Payer: Self-pay | Admitting: *Deleted

## 2014-12-11 ENCOUNTER — Ambulatory Visit (HOSPITAL_COMMUNITY): Payer: PRIVATE HEALTH INSURANCE | Attending: Cardiovascular Disease

## 2014-12-11 ENCOUNTER — Other Ambulatory Visit: Payer: Self-pay

## 2014-12-11 DIAGNOSIS — I255 Ischemic cardiomyopathy: Secondary | ICD-10-CM

## 2014-12-11 DIAGNOSIS — I517 Cardiomegaly: Secondary | ICD-10-CM | POA: Diagnosis not present

## 2014-12-11 DIAGNOSIS — I340 Nonrheumatic mitral (valve) insufficiency: Secondary | ICD-10-CM | POA: Diagnosis not present

## 2014-12-11 DIAGNOSIS — E785 Hyperlipidemia, unspecified: Secondary | ICD-10-CM

## 2014-12-11 DIAGNOSIS — I351 Nonrheumatic aortic (valve) insufficiency: Secondary | ICD-10-CM | POA: Diagnosis not present

## 2014-12-11 LAB — HEPATIC FUNCTION PANEL
ALBUMIN: 3.5 g/dL (ref 3.5–5.2)
ALT: 144 U/L — ABNORMAL HIGH (ref 0–35)
AST: 74 U/L — ABNORMAL HIGH (ref 0–37)
Alkaline Phosphatase: 159 U/L — ABNORMAL HIGH (ref 39–117)
Bilirubin, Direct: 0.2 mg/dL (ref 0.0–0.3)
Total Bilirubin: 0.8 mg/dL (ref 0.2–1.2)
Total Protein: 6.4 g/dL (ref 6.0–8.3)

## 2014-12-11 LAB — LIPID PANEL
CHOLESTEROL: 94 mg/dL (ref 0–200)
HDL: 30.8 mg/dL — ABNORMAL LOW (ref >39.00)
LDL Cholesterol: 50 mg/dL (ref 0–99)
NonHDL: 63.2
TRIGLYCERIDES: 64 mg/dL (ref 0.0–149.0)
Total CHOL/HDL Ratio: 3
VLDL: 12.8 mg/dL (ref 0.0–40.0)

## 2014-12-11 MED ORDER — ATORVASTATIN CALCIUM 20 MG PO TABS: 20.0000 mg | ORAL_TABLET | Freq: Every day | ORAL | Status: DC

## 2014-12-11 NOTE — Telephone Encounter (Signed)
Follow Up    Pt is returning call   Please call pt back thank you

## 2014-12-11 NOTE — Addendum Note (Signed)
Addended by: Tonita PhoenixBOWDEN, Yuval Nolet K on: 12/11/2014 10:00 AM   Modules accepted: Orders

## 2014-12-11 NOTE — Telephone Encounter (Signed)
lmptcb to go over lab results.  per DOD Dr. Myrtis SerKatz to decrease lipitor to 20 mg with repeat LFT in 2 weeks.

## 2014-12-11 NOTE — Telephone Encounter (Signed)
Pt notified of lab reuslts and to decrease lipitor back down to 20 mg per Dr. Myrtis SerKatz with repeat LFT in 2 weeks. Pt agreeable to plan of care.

## 2014-12-13 ENCOUNTER — Encounter: Payer: Self-pay | Admitting: Physician Assistant

## 2014-12-14 ENCOUNTER — Telehealth: Payer: Self-pay | Admitting: *Deleted

## 2014-12-14 DIAGNOSIS — R945 Abnormal results of liver function studies: Secondary | ICD-10-CM

## 2014-12-14 DIAGNOSIS — R7989 Other specified abnormal findings of blood chemistry: Secondary | ICD-10-CM

## 2014-12-14 DIAGNOSIS — E785 Hyperlipidemia, unspecified: Secondary | ICD-10-CM

## 2014-12-14 NOTE — Telephone Encounter (Signed)
Lmptcb to go over echo results. Per Dr. Royann Shiversroitoru pt needs to see him first to discuss further about referral to EP to discuss about possible ICD.

## 2014-12-14 NOTE — Progress Notes (Signed)
Keep scheduled appointment in October.

## 2014-12-15 NOTE — Telephone Encounter (Signed)
Lmptcb several times to go over echo results and to advise pt that Dr. Royann Shiversroitoru wants her to f/u w/him to discuss about ICD.

## 2014-12-16 NOTE — Telephone Encounter (Signed)
Follow up     Pt returning Carol's call.  Please call to advise

## 2014-12-16 NOTE — Telephone Encounter (Signed)
Pt notified of echo results and per Dr. Royann Shiversroitoru to f/u with him to discuss about ICD, EF 20-25%.

## 2014-12-23 ENCOUNTER — Telehealth: Payer: Self-pay | Admitting: Physician Assistant

## 2014-12-23 NOTE — Telephone Encounter (Signed)
Spoke with pt and informed her that she is having the labs drawn to recheck her LFTs since they were elevated at the last draw and meds were adjusted. Pt verbalized understanding and was in agreement with this plan.

## 2014-12-23 NOTE — Telephone Encounter (Signed)
New message      Pt want to know why she has to have labs drawn again on Friday.  No one told her anything.

## 2014-12-24 ENCOUNTER — Telehealth: Payer: Self-pay | Admitting: Cardiovascular Disease

## 2014-12-24 NOTE — Telephone Encounter (Signed)
Closed encounter °

## 2014-12-25 ENCOUNTER — Other Ambulatory Visit (INDEPENDENT_AMBULATORY_CARE_PROVIDER_SITE_OTHER): Payer: PRIVATE HEALTH INSURANCE | Admitting: *Deleted

## 2014-12-25 DIAGNOSIS — I10 Essential (primary) hypertension: Secondary | ICD-10-CM

## 2014-12-25 DIAGNOSIS — R945 Abnormal results of liver function studies: Secondary | ICD-10-CM

## 2014-12-25 DIAGNOSIS — R7989 Other specified abnormal findings of blood chemistry: Secondary | ICD-10-CM

## 2014-12-25 DIAGNOSIS — R9431 Abnormal electrocardiogram [ECG] [EKG]: Secondary | ICD-10-CM

## 2014-12-25 DIAGNOSIS — E785 Hyperlipidemia, unspecified: Secondary | ICD-10-CM

## 2014-12-25 DIAGNOSIS — I4892 Unspecified atrial flutter: Secondary | ICD-10-CM

## 2014-12-25 DIAGNOSIS — I5042 Chronic combined systolic (congestive) and diastolic (congestive) heart failure: Secondary | ICD-10-CM

## 2014-12-25 DIAGNOSIS — I5021 Acute systolic (congestive) heart failure: Secondary | ICD-10-CM

## 2014-12-25 LAB — HEPATIC FUNCTION PANEL
ALBUMIN: 3.3 g/dL — AB (ref 3.5–5.2)
ALT: 81 U/L — AB (ref 0–35)
AST: 38 U/L — ABNORMAL HIGH (ref 0–37)
Alkaline Phosphatase: 133 U/L — ABNORMAL HIGH (ref 39–117)
BILIRUBIN TOTAL: 0.6 mg/dL (ref 0.2–1.2)
Bilirubin, Direct: 0.2 mg/dL (ref 0.0–0.3)
Indirect Bilirubin: 0.4 mg/dL (ref 0.2–1.2)
Total Protein: 5.6 g/dL — ABNORMAL LOW (ref 6.0–8.3)

## 2014-12-25 LAB — GAMMA GT: GGT: 207 U/L — AB (ref 7–51)

## 2014-12-31 LAB — NUCLEOTIDASE, 5', BLOOD: 5 NUCLEOTIDASE: 14 U/L — AB (ref <11)

## 2015-01-01 ENCOUNTER — Telehealth: Payer: Self-pay | Admitting: *Deleted

## 2015-01-01 NOTE — Telephone Encounter (Signed)
lmptcb re: lab work.Vanetta Mulders 01-01-15

## 2015-01-01 NOTE — Telephone Encounter (Signed)
Pt notified of lab results and to remain of lipitor and to f/u w/PCP for further evaluation of LFT. Pt states she does not have PCP right now. I advised I will d/w PA and cb Monday w/further advice. Pt said ok and thank you for all of our help.

## 2015-01-04 ENCOUNTER — Telehealth: Payer: Self-pay | Admitting: Physician Assistant

## 2015-01-04 ENCOUNTER — Telehealth: Payer: Self-pay | Admitting: *Deleted

## 2015-01-04 DIAGNOSIS — R7989 Other specified abnormal findings of blood chemistry: Secondary | ICD-10-CM

## 2015-01-04 DIAGNOSIS — R945 Abnormal results of liver function studies: Secondary | ICD-10-CM

## 2015-01-04 NOTE — Telephone Encounter (Signed)
lmptcb to advise about referral to GI for abnormal LFT, pt does not have a PCP.

## 2015-01-04 NOTE — Telephone Encounter (Signed)
Follow up     Pt returning Carol's call Please call to discuss

## 2015-01-04 NOTE — Telephone Encounter (Signed)
Follow up ° ° ° ° °Pt returning your call. °Please call to discuss °

## 2015-01-04 NOTE — Telephone Encounter (Signed)
lmptcb to advise pt per Bing Neighbors. PA since pt does not have a PCP then we will refer to GI for further f/u on LFT's. I s/w pt on 7/29 and she is aware to stay of Lipitor at this time. Referral for GI placed in system today.

## 2015-01-05 NOTE — Telephone Encounter (Signed)
lmptcb x 2 to advise pt that we are referring her to GI due abnormal LFT's

## 2015-01-06 NOTE — Telephone Encounter (Signed)
ptcb today and is aware she is being referred to GI dx abnormal LFT. Pt said ok to leave detailed message with date and time on her HOME #. She said she needs an afternoon appt anything after 1 pm on any day would be fine. I will have PCC call and lmom.

## 2015-01-07 ENCOUNTER — Telehealth: Payer: Self-pay | Admitting: Physician Assistant

## 2015-01-07 NOTE — Telephone Encounter (Signed)
error 

## 2015-01-14 ENCOUNTER — Ambulatory Visit: Payer: Medicare Other | Admitting: Cardiovascular Disease

## 2015-01-15 ENCOUNTER — Ambulatory Visit (INDEPENDENT_AMBULATORY_CARE_PROVIDER_SITE_OTHER): Payer: Medicare HMO | Admitting: Cardiovascular Disease

## 2015-01-15 ENCOUNTER — Encounter: Payer: Self-pay | Admitting: Cardiovascular Disease

## 2015-01-15 VITALS — BP 130/74 | HR 81 | Ht 66.0 in | Wt 134.0 lb

## 2015-01-15 DIAGNOSIS — I5043 Acute on chronic combined systolic (congestive) and diastolic (congestive) heart failure: Secondary | ICD-10-CM | POA: Diagnosis not present

## 2015-01-15 DIAGNOSIS — Z9189 Other specified personal risk factors, not elsewhere classified: Secondary | ICD-10-CM | POA: Diagnosis not present

## 2015-01-15 MED ORDER — FUROSEMIDE 40 MG PO TABS: 40.0000 mg | ORAL_TABLET | Freq: Every day | ORAL | Status: DC

## 2015-01-15 MED ORDER — POTASSIUM CHLORIDE ER 10 MEQ PO TBCR: 10.0000 meq | EXTENDED_RELEASE_TABLET | Freq: Every day | ORAL | Status: DC

## 2015-01-15 MED ORDER — VALSARTAN 160 MG PO TABS: 160.0000 mg | ORAL_TABLET | Freq: Every day | ORAL | Status: DC

## 2015-01-15 NOTE — Patient Instructions (Addendum)
Medication Instructions:   INCREASE VALSARTAN TO  DAILY  START FUROSEMIDE  DAILY - IN THE MORNINGS  START POTASSIUM DAILY   Follow-Up:  Dr. Royann Shivers recommends that you schedule a follow-up appointment in:   10-14 DAYS   Your physician recommends that you weigh, daily, at the same time every day, and in the same amount of clothing. Please record your daily weights on the handout provided and bring it to your next appointment.   Daily Weight Record It is important to weigh yourself daily. Keep this daily weight chart near your scale. Weigh yourself each morning at the same time. Weigh yourself without shoes and with the same amount of clothes each day. Compare today's weight to yesterday's weight. Bring this form with you to your follow-up appointments. Call your caregiver if you gain 03 lb/1.4 kg in 1 day. Call your caregiver if you gain 05 lb/2.3 kg in a week. Date: ________ Weight: ____________________ Date: ________ Weight: ____________________ Date: ________ Weight: ____________________ Date: ________ Weight: ____________________ Date: ________ Weight: ____________________ Date: ________ Weight: ____________________ Date: ________ Weight: ____________________ Date: ________ Weight: ____________________ Date: ________ Weight: ____________________ Date: ________ Weight: ____________________ Date: ________ Weight: ____________________ Date: ________ Weight: ____________________ Date: ________ Weight: ____________________ Date: ________ Weight: ____________________ Date: ________ Weight: ____________________ Date: ________ Weight: ____________________ Date: ________ Weight: ____________________ Date: ________ Weight: ____________________ Date: ________ Weight: ____________________ Date: ________ Weight: ____________________ Date: ________ Weight: ____________________ Date: ________ Weight: ____________________ Date: ________ Weight: ____________________ Date:  ________ Weight: ____________________ Date: ________ Weight: ____________________ Date: ________ Weight: ____________________ Date: ________ Weight: ____________________ Date: ________ Weight: ____________________ Date: ________ Weight: ____________________ Date: ________ Weight: ____________________ Date: ________ Weight: ____________________ Date: ________ Weight: ____________________ Date: ________ Weight: ____________________ Date: ________ Weight: ____________________ Date: ________ Weight: ____________________ Date: ________ Weight: ____________________ Date: ________ Weight: ____________________ Date: ________ Weight: ____________________ Date: ________ Weight: ____________________ Date: ________ Weight: ____________________ Date: ________ Weight: ____________________ Date: ________ Weight: ____________________ Date: ________ Weight: ____________________ Date: ________ Weight: ____________________ Date: ________ Weight: ____________________ Date: ________ Weight: ____________________ Date: ________ Weight: ____________________ Date: ________ Weight: ____________________ Date: ________ Weight: ____________________ Date: ________ Weight: ____________________ Document Released: 08/03/2006 Document Revised: 08/14/2011 Document Reviewed: 05/10/2007 [employer] Patient Information 2015 [employer]. This information is not intended to replace advice given to you by your health care provider. Make sure you discuss any questions you have with your health care provider.   Low-Sodium Eating Plan Sodium raises blood pressure and causes water to be held in the body. Getting less sodium from food will help lower your blood pressure, reduce any swelling, and protect your heart, liver, and kidneys. We get sodium by adding salt (sodium chloride) to food. Most of our sodium comes from canned, boxed, and frozen foods. Restaurant foods, fast foods, and pizza are also very high in sodium. Even if you take  medicine to lower your blood pressure or to reduce fluid in your body, getting less sodium from your food is important. WHAT IS MY PLAN? Most people should limit their sodium intake to 2,300 mg a day. Your health care provider recommends that you limit your sodium intake to ____2000______ a day.  WHAT DO I NEED TO KNOW ABOUT THIS EATING PLAN? For the low-sodium eating plan, you will follow these general guidelines:  Choose foods with a % Daily Value for sodium of less than 5% (as listed on the food label).   Use salt-free seasonings or herbs instead of table salt or sea salt.   Check with your health care provider  or pharmacist before using salt substitutes.   Eat fresh foods.  Eat more vegetables and fruits.  Limit canned vegetables. If you do use them, rinse them well to decrease the sodium.   Limit cheese to 1 oz (28 g) per day.   Eat lower-sodium products, often labeled as "lower sodium" or "no salt added."  Avoid foods that contain monosodium glutamate (MSG). MSG is sometimes added to [place] food and some canned foods.  Check food labels (Nutrition Facts labels) on foods to learn how much sodium is in one serving.  Eat more home-cooked food and less restaurant, buffet, and fast food.  When eating at a restaurant, ask that your food be prepared with less salt or none, if possible.  HOW DO I READ FOOD LABELS FOR SODIUM INFORMATION? The Nutrition Facts label lists the amount of sodium in one serving of the food. If you eat more than one serving, you must multiply the listed amount of sodium by the number of servings. Food labels may also identify foods as:  Sodium free--Less than 5 mg in a serving.  Very low sodium--35 mg or less in a serving.  Low sodium--140 mg or less in a serving.  Light in sodium--50% less sodium in a serving. For example, if a food that usually has 300 mg of sodium is changed to become light in sodium, it will have 150 mg of  sodium.  Reduced sodium--25% less sodium in a serving. For example, if a food that usually has 400 mg of sodium is changed to reduced sodium, it will have 300 mg of sodium. WHAT FOODS CAN I EAT? Grains Low-sodium cereals, including oats, puffed wheat and rice, and shredded wheat cereals. Low-sodium crackers. Unsalted rice and pasta. Lower-sodium bread.  Vegetables Frozen or fresh vegetables. Low-sodium or reduced-sodium canned vegetables. Low-sodium or reduced-sodium tomato sauce and paste. Low-sodium or reduced-sodium tomato and vegetable juices.  Fruits Fresh, frozen, and canned fruit. Fruit juice.  Meat and Other Protein Products Low-sodium canned tuna and salmon. Fresh or frozen meat, poultry, seafood, and fish. Lamb. Unsalted nuts. Dried beans, peas, and lentils without added salt. Unsalted canned beans. Homemade soups without salt. Eggs.  Dairy Milk. Soy milk. Ricotta cheese. Low-sodium or reduced-sodium cheeses. Yogurt.  Condiments Fresh and dried herbs and spices. Salt-free seasonings. Onion and garlic powders. Low-sodium varieties of mustard and ketchup. Lemon juice.  Fats and Oils Reduced-sodium salad dressings. Unsalted butter.  Other Unsalted popcorn and pretzels.  The items listed above may not be a complete list of recommended foods or beverages. Contact your dietitian for more options. WHAT FOODS ARE NOT RECOMMENDED? Grains Instant hot cereals. Bread stuffing, pancake, and biscuit mixes. Croutons. Seasoned rice or pasta mixes. Noodle soup cups. Boxed or frozen macaroni and cheese. Self-rising flour. Regular salted crackers. Vegetables Regular canned vegetables. Regular canned tomato sauce and paste. Regular tomato and vegetable juices. Frozen vegetables in sauces. Salted french fries. Olives. Rosita Fire. Relishes. Sauerkraut. Salsa. Meat and Other Protein Products Salted, canned, smoked, spiced, or pickled meats, seafood, or fish. Bacon, ham, sausage, hot dogs,  corned beef, chipped beef, and packaged luncheon meats. Salt pork. Jerky. Pickled herring. Anchovies, regular canned tuna, and sardines. Salted nuts. Dairy Processed cheese and cheese spreads. Cheese curds. Blue cheese and cottage cheese. Buttermilk.  Condiments Onion and garlic salt, seasoned salt, table salt, and sea salt. Canned and packaged gravies. Worcestershire sauce. Tartar sauce. Barbecue sauce. Teriyaki sauce. Soy sauce, including reduced sodium. Steak sauce. Fish sauce. Oyster sauce. Cocktail sauce. Horseradish.  Regular ketchup and mustard. Meat flavorings and tenderizers. Bouillon cubes. Hot sauce. Tabasco sauce. Marinades. Taco seasonings. Relishes. Fats and Oils Regular salad dressings. Salted butter. Margarine. Ghee. Bacon fat.  Other Potato and tortilla chips. Corn chips and puffs. Salted popcorn and pretzels. Canned or dried soups. Pizza. Frozen entrees and pot pies.  The items listed above may not be a complete list of foods and beverages to avoid. Contact your dietitian for more information. Document Released: 11/11/2001 Document Revised: 05/27/2013 Document Reviewed: 03/26/2013 [facility] Patient Information 2015 [place], [place]. This information is not intended to replace advice given to you by your health care provider. Make sure you discuss any questions you have with your health care provider.

## 2015-01-15 NOTE — Progress Notes (Signed)
Patient ID: Sheri Dunn, female   DOB: Nov 15, 1950, 64 y.o.   MRN: [id number]     Cardiology Office Note   Date:  01/17/2015   ID:  Sheri Dunn, DOB 18-Feb-1951, MRN [id number]  PCP:  No primary care provider on file.  Cardiologist:   Thurmon Fair, MD   Chief Complaint  Patient presents with  . Discuss ICD Implant    Patient has HAD SWELLING IN HER FEET, LEGS, AND ANKLES.      History of Present Illness: Sheri Dunn is a 64 y.o. female who presents for  Coronary artery disease complicated by severe ischemic cardiomyopathy.  She initially presented with heart disease in April 2016 when she had a non-ST segment elevation myocardial infarction. She received a drug-eluting stent to the mid circumflex coronary artery and another drug-eluting stent to distal right coronary artery. Ejection fraction at that time was 20% by angiography, 25-30%by echocardiography  And failed to improve on medical therapy (still 20-25%on echo July 2016).  Since her last office evaluation in June she has gained 13 pounds and has substantial leg edema and shortness of breath with minimal activity (NYHA functional class III). She denies orthopnea or PND.  he does describe a recumbent cough. She has not had angina pectoris and denies palpitations or syncope. She is generally compliant with sodium restriction, but is unaware of the high concentrations of salt and certain foods. She has not been weighing herself daily at home. She is accompanied today by several family members.   Her statin was stopped due to mildly elevated liver function tests (2-3 times upper limit of normal transaminases). She denies abdominal pain , change in bowel pattern, change in color of stools, hematemesis or melanoma, nausea or vomiting. While on atorvastatin her LDL was excellent at 50, HDL was still low at 31 , triglycerides 64.    Past Medical History  Diagnosis Date  . Hypertension   . HLD (hyperlipidemia)   . CAD (coronary  artery disease)     a. NSTEMI 4/16:  LHC - mCFX 90, dRCA 90, EF 20% >> PCI: Synergy DES to mCFX and Synergy DES to dRCA  . Ischemic cardiomyopathy     a. Echo 4/16:  EF 25-30%, diff HK, ant-septal HK, Gr 1 DD, mod AI, mod LAE, mild RVE;  b.  Echo 7/16:  EF 20-25%, severe HK, Gr 1 DD, mod AI, mod MR, mod LAE, mod RVE, mod reduced RVSF, mild RAE  . Chronic systolic CHF (congestive heart failure)   . Prolonged Q-T interval on ECG     Past Surgical History  Procedure Laterality Date  . Left heart catheterization with coronary angiogram N/A 09/08/2014    Procedure: LEFT HEART CATHETERIZATION WITH CORONARY ANGIOGRAM;  Surgeon: Corky Crafts, MD;  Location: [facility] CATH LAB;  Service: Cardiovascular;  Laterality: N/A;  . Cardiac catheterization Right 09/08/2014    Procedure: CORONARY STENT INTERVENTION;  Surgeon: Corky Crafts, MD;  Location: [facility] CATH LAB;  Service: Cardiovascular;  Laterality: Right;     Current Outpatient Prescriptions  Medication Sig Dispense Refill  . aspirin 81 MG chewable tablet Chew 1 tablet (81 mg total) by mouth daily. 30 tablet 3  . atorvastatin (LIPITOR) 20 MG tablet Take 1 tablet (20 mg total) by mouth daily. 30 tablet 11  . carvedilol (COREG) 12.5 MG tablet Take 1 tablet (12.5 mg total) by mouth 2 (two) times daily with a meal. 180 tablet 3  . Cholecalciferol (VITAMIN D3) 2000 UNITS TABS Take 2,000 Units  by mouth daily.    . prasugrel (EFFIENT) 10 MG TABS tablet Take 1 tablet (10 mg total) by mouth daily. 30 tablet 6  . furosemide (LASIX) 40 MG tablet Take 1 tablet (40 mg total) by mouth daily. 90 tablet 3  . potassium chloride (K-DUR) 10 MEQ tablet Take 1 tablet (10 mEq total) by mouth daily. 90 tablet 3  . valsartan (DIOVAN) 160 MG tablet Take 1 tablet (160 mg total) by mouth daily. 90 tablet 3   No current facility-administered medications for this visit.    Allergies:   Review of patient's allergies indicates no known allergies.    Social History:  The  patient  reports that she has never smoked. She does not have any smokeless tobacco history on file. She reports that she does not drink alcohol or use illicit drugs.   Family History:  The patient's family history includes Heart attack in her mother; Stroke in her mother.    ROS:  Please see the history of present illness.    Otherwise, review of systems positive for  Easy bruising but no serious bleeding.   All other systems are reviewed and negative.    PHYSICAL EXAM: VS:  BP 130/74 mmHg  Pulse 81  Ht 5\' 6"  (1.676 m)  Wt 134 lb (60.782 kg)  BMI 21.64 kg/m2 , BMI Body mass index is 21.64 kg/(m^2).  General: Alert, oriented x3, no distress Head: no evidence of trauma, PERRL, EOMI, no exophtalmos or lid lag, no myxedema, no xanthelasma; normal ears, nose and oropharynx Neck: 8-10 cm elevation in jugular venous pulsations and no hepatojugular reflux; brisk carotid pulses without delay and no carotid bruits Chest: clear to auscultation, no signs of consolidation by percussion or palpation, normal fremitus, symmetrical and full respiratory excursions Cardiovascular: normal position and quality of the apical impulse, regular rhythm, normal first and second heart sounds,  Systolic left lower sternal border murmur, no murmurs, rubs or gallops Abdomen: no tenderness or distention, no masses by palpation, no abnormal pulsatility or arterial bruits, normal bowel sounds, no hepatosplenomegaly Extremities: no clubbing, cyanosis, 3+ bilateral pretibial and pedal pitting edema; 2+ radial, ulnar and brachial pulses bilaterally; 2+ right femoral, posterior tibial and dorsalis pedis pulses; 2+ left femoral, posterior tibial and dorsalis pedis pulses; no subclavian or femoral bruits Neurological: grossly nonfocal Psych: euthymic mood, full affect   EKG:  EKG is not ordered today.  Recent Labs: 09/05/2014: B Natriuretic Peptide 4342.6*; TSH 1.940 09/08/2014: Magnesium 2.1 10/07/2014: BUN 24*; Creatinine,  Ser 0.80; Hemoglobin 14.8; Platelets 143*; Potassium 4.4; Sodium 140 12/25/2014: ALT 81*    Lipid Panel    Component Value Date/Time   CHOL 94 12/11/2014 1000   TRIG 64.0 12/11/2014 1000   HDL 30.80* 12/11/2014 1000   CHOLHDL 3 12/11/2014 1000   VLDL 12.8 12/11/2014 1000   LDLCALC 50 12/11/2014 1000      Wt Readings from Last 3 Encounters:  01/15/15 134 lb (60.782 kg)  11/20/14 121 lb 6.4 oz (55.067 kg)  10/07/14 131 lb 7 oz (59.62 kg)    .   ASSESSMENT AND PLAN:  1.  Acute on chronic combined systolic and diastolic heart failure.  Obviously hypervolemic today. Spent a long time with the patient and her family discussing the signs and symptoms of congestive heart failure, the importance of sodium restriction , daily weight monitoring. Will start diuretic therapy and potassium supplement and will need to reevaluate her fairly quickly. We'll also increase the dose of angiotensin receptor blocker (  relatively low dose due to previous hypotension, currently plenty of blood pressure to work with).  2. Severe ischemic cardiomyopathy with left ventricular ejection fraction 20-25 percent, not improved despite two-vessel revascularization and medical therapy for congestive heart failure.  Echo shows evidence of severe to dysfunction of the left ventricle and moderate dysfunction of the right ventricle. She is at increased risk for sudden cardiac death. She meets criteria for Primary prevention ICD implantation (Prior myocardial infarction, left ventricular ejection fraction under 35%, heart failure NYHA class II-III, on comprehensive medical therapy).  We spent a long time also discussing the pros and cons of ICD therapy , technical issues regarding the device, possibly complications of the time of implantation or a long-term follow-up , remote monitoring , etc. We reviewed the fact the defibrillator will have no impact on quality of life but may have an impact on longevity.  3. CAD s/p DES to  mid LCX and distal RCA April 2016.  She is to remain on dual antiplatelets therapy until next April at least.  If we go ahead with ICD in the near future, we'll not interrupt her Effient.  4.  Hyperlipidemia. It is just as likely that her abnormal liver function tests are due to liver congestion from heart failure as they might be  A sign of statin hepatotoxicity. Will reevaluate liver function tests after she has been appropriately diuresed.  5.  Moderate aortic insufficiency by echocardiography  6. Essential hypertension , controlled   Current medicines are reviewed at length with the patient today.  The patient has concerns regarding medicines.  She believes the antiplatelets agent is responsible for fatigue and weakness. I told her I think her symptoms are secondary to heart failure.  Labs/ tests ordered today include:  No orders of the defined types were placed in this encounter.    Patient Instructions      Medication Instructions:   INCREASE VALSARTAN TO  DAILY  START FUROSEMIDE  DAILY - IN THE MORNINGS  START POTASSIUM DAILY   Follow-Up:  Dr. Royann Shivers recommends that you schedule a follow-up appointment in:   10-14 DAYS   Your physician recommends that you weigh, daily, at the same time every day, and in the same amount of clothing. Please record your daily weights on the handout provided and bring it to your next appointment.   Daily Weight Record It is important to weigh yourself daily. Keep this daily weight chart near your scale. Weigh yourself each morning at the same time. Weigh yourself without shoes and with the same amount of clothes each day. Compare today's weight to yesterday's weight. Bring this form with you to your follow-up appointments. Call your caregiver if you gain 03 lb/1.4 kg in 1 day. Call your caregiver if you gain 05 lb/2.3 kg in a week. Date: ________ Weight: ____________________ Date: ________ Weight:  ____________________ Date: ________ Weight: ____________________ Date: ________ Weight: ____________________ Date: ________ Weight: ____________________ Date: ________ Weight: ____________________ Date: ________ Weight: ____________________ Date: ________ Weight: ____________________ Date: ________ Weight: ____________________ Date: ________ Weight: ____________________ Date: ________ Weight: ____________________ Date: ________ Weight: ____________________ Date: ________ Weight: ____________________ Date: ________ Weight: ____________________ Date: ________ Weight: ____________________ Date: ________ Weight: ____________________ Date: ________ Weight: ____________________ Date: ________ Weight: ____________________ Date: ________ Weight: ____________________ Date: ________ Weight: ____________________ Date: ________ Weight: ____________________ Date: ________ Weight: ____________________ Date: ________ Weight: ____________________ Date: ________ Weight: ____________________ Date: ________ Weight: ____________________ Date: ________ Weight: ____________________ Date: ________ Weight: ____________________ Date: ________ Weight: ____________________ Date: ________ Weight: ____________________  Date: ________ Weight: ____________________ Date: ________ Weight: ____________________ Date: ________ Weight: ____________________ Date: ________ Weight: ____________________ Date: ________ Weight: ____________________ Date: ________ Weight: ____________________ Date: ________ Weight: ____________________ Date: ________ Weight: ____________________ Date: ________ Weight: ____________________ Date: ________ Weight: ____________________ Date: ________ Weight: ____________________ Date: ________ Weight: ____________________ Date: ________ Weight: ____________________ Date: ________ Weight: ____________________ Date: ________ Weight: ____________________ Date: ________ Weight: ____________________ Date: ________  Weight: ____________________ Date: ________ Weight: ____________________ Date: ________ Weight: ____________________ Date: ________ Weight: ____________________ Date: ________ Weight: ____________________ Document Released: 08/03/2006 Document Revised: 08/14/2011 Document Reviewed: 05/10/2007 [employer] Patient Information 2015 [place] [employer]. This information is not intended to replace advice given to you by your health care provider. Make sure you discuss any questions you have with your health care provider.   Low-Sodium Eating Plan Sodium raises blood pressure and causes water to be held in the body. Getting less sodium from food will help lower your blood pressure, reduce any swelling, and protect your heart, liver, and kidneys. We get sodium by adding salt (sodium chloride) to food. Most of our sodium comes from canned, boxed, and frozen foods. Restaurant foods, fast foods, and pizza are also very high in sodium. Even if you take medicine to lower your blood pressure or to reduce fluid in your body, getting less sodium from your food is important. WHAT IS MY PLAN? Most people should limit their sodium intake to 2,300 mg a day. Your health care provider recommends that you limit your sodium intake to ____2000______ a day.  WHAT DO I NEED TO KNOW ABOUT THIS EATING PLAN? For the low-sodium eating plan, you will follow these general guidelines:  Choose foods with a % Daily Value for sodium of less than 5% (as listed on the food label).   Use salt-free seasonings or herbs instead of table salt or sea salt.   Check with your health care provider or pharmacist before using salt substitutes.   Eat fresh foods.  Eat more vegetables and fruits.  Limit canned vegetables. If you do use them, rinse them well to decrease the sodium.   Limit cheese to 1 oz (28 g) per day.   Eat lower-sodium products, often labeled as "lower sodium" or "no salt added."  Avoid foods that contain monosodium  glutamate (MSG). MSG is sometimes added to [place] food and some canned foods.  Check food labels (Nutrition Facts labels) on foods to learn how much sodium is in one serving.  Eat more home-cooked food and less restaurant, buffet, and fast food.  When eating at a restaurant, ask that your food be prepared with less salt or none, if possible.  HOW DO I READ FOOD LABELS FOR SODIUM INFORMATION? The Nutrition Facts label lists the amount of sodium in one serving of the food. If you eat more than one serving, you must multiply the listed amount of sodium by the number of servings. Food labels may also identify foods as:  Sodium free--Less than 5 mg in a serving.  Very low sodium--35 mg or less in a serving.  Low sodium--140 mg or less in a serving.  Light in sodium--50% less sodium in a serving. For example, if a food that usually has 300 mg of sodium is changed to become light in sodium, it will have 150 mg of sodium.  Reduced sodium--25% less sodium in a serving. For example, if a food that usually has 400 mg of sodium is changed to reduced sodium, it will have 300 mg of sodium. WHAT FOODS CAN I EAT?  Grains Low-sodium cereals, including oats, puffed wheat and rice, and shredded wheat cereals. Low-sodium crackers. Unsalted rice and pasta. Lower-sodium bread.  Vegetables Frozen or fresh vegetables. Low-sodium or reduced-sodium canned vegetables. Low-sodium or reduced-sodium tomato sauce and paste. Low-sodium or reduced-sodium tomato and vegetable juices.  Fruits Fresh, frozen, and canned fruit. Fruit juice.  Meat and Other Protein Products Low-sodium canned tuna and salmon. Fresh or frozen meat, poultry, seafood, and fish. Lamb. Unsalted nuts. Dried beans, peas, and lentils without added salt. Unsalted canned beans. Homemade soups without salt. Eggs.  Dairy Milk. Soy milk. Ricotta cheese. Low-sodium or reduced-sodium cheeses. Yogurt.  Condiments Fresh and dried herbs and spices.  Salt-free seasonings. Onion and garlic powders. Low-sodium varieties of mustard and ketchup. Lemon juice.  Fats and Oils Reduced-sodium salad dressings. Unsalted butter.  Other Unsalted popcorn and pretzels.  The items listed above may not be a complete list of recommended foods or beverages. Contact your dietitian for more options. WHAT FOODS ARE NOT RECOMMENDED? Grains Instant hot cereals. Bread stuffing, pancake, and biscuit mixes. Croutons. Seasoned rice or pasta mixes. Noodle soup cups. Boxed or frozen macaroni and cheese. Self-rising flour. Regular salted crackers. Vegetables Regular canned vegetables. Regular canned tomato sauce and paste. Regular tomato and vegetable juices. Frozen vegetables in sauces. Salted french fries. Olives. Rosita Fire. Relishes. Sauerkraut. Salsa. Meat and Other Protein Products Salted, canned, smoked, spiced, or pickled meats, seafood, or fish. Bacon, ham, sausage, hot dogs, corned beef, chipped beef, and packaged luncheon meats. Salt pork. Jerky. Pickled herring. Anchovies, regular canned tuna, and sardines. Salted nuts. Dairy Processed cheese and cheese spreads. Cheese curds. Blue cheese and cottage cheese. Buttermilk.  Condiments Onion and garlic salt, seasoned salt, table salt, and sea salt. Canned and packaged gravies. Worcestershire sauce. Tartar sauce. Barbecue sauce. Teriyaki sauce. Soy sauce, including reduced sodium. Steak sauce. Fish sauce. Oyster sauce. Cocktail sauce. Horseradish. Regular ketchup and mustard. Meat flavorings and tenderizers. Bouillon cubes. Hot sauce. Tabasco sauce. Marinades. Taco seasonings. Relishes. Fats and Oils Regular salad dressings. Salted butter. Margarine. Ghee. Bacon fat.  Other Potato and tortilla chips. Corn chips and puffs. Salted popcorn and pretzels. Canned or dried soups. Pizza. Frozen entrees and pot pies.  The items listed above may not be a complete list of foods and beverages to avoid. Contact your  dietitian for more information. Document Released: 11/11/2001 Document Revised: 05/27/2013 Document Reviewed: 03/26/2013 [facility] Patient Information 2015 [place], [place]. This information is not intended to replace advice given to you by your health care provider. Make sure you discuss any questions you have with your health care provider.       Joie Bimler, MD  01/17/2015 1:50 PM    Thurmon Fair, MD, [facility] [phone] office [phone] pager

## 2015-01-16 ENCOUNTER — Other Ambulatory Visit: Payer: Self-pay | Admitting: Cardiology

## 2015-01-16 ENCOUNTER — Telehealth: Payer: Self-pay | Admitting: Cardiology

## 2015-01-16 MED ORDER — FUROSEMIDE 40 MG PO TABS: 40.0000 mg | ORAL_TABLET | Freq: Every day | ORAL | Status: DC

## 2015-01-16 NOTE — Telephone Encounter (Signed)
Pt called, pharmacy did not receive the Rx for Lasix- i re ordered.  Corine Shelter PA-C 01/16/2015 12:23 PM

## 2015-01-17 DIAGNOSIS — I5043 Acute on chronic combined systolic (congestive) and diastolic (congestive) heart failure: Secondary | ICD-10-CM

## 2015-01-17 DIAGNOSIS — Z9189 Other specified personal risk factors, not elsewhere classified: Secondary | ICD-10-CM | POA: Insufficient documentation

## 2015-01-17 HISTORY — DX: Acute on chronic combined systolic (congestive) and diastolic (congestive) heart failure: I50.43

## 2015-01-18 ENCOUNTER — Telehealth: Payer: Self-pay | Admitting: Cardiovascular Disease

## 2015-01-18 MED ORDER — POTASSIUM CHLORIDE ER 10 MEQ PO TBCR: 10.0000 meq | EXTENDED_RELEASE_TABLET | Freq: Every day | ORAL | Status: DC

## 2015-01-18 MED ORDER — VALSARTAN 160 MG PO TABS: 160.0000 mg | ORAL_TABLET | Freq: Every day | ORAL | Status: DC

## 2015-01-18 NOTE — Telephone Encounter (Signed)
Rx refilled - left msg informing pt.

## 2015-01-18 NOTE — Telephone Encounter (Signed)
LMOM for patient to return call.  OTC potassium is gluconate not chloride.  Standard OTC dose is potassium gluconate 550 mg, which would be equal to 2.5 mEq of prescription potassium chloride.  She would need to take 4 tablets to equal same dose as prescription, so would probably be easier and cheaper to get the prescription.

## 2015-01-18 NOTE — Telephone Encounter (Signed)
PER ANSWERING SERVICE:  1. Which medications need to be refilled? K-Dur and Diovan  2. Which pharmacy is medication to be sent to? [facility] [address]  3. Do they need a 30 day or 90 day supply? 90  4. Would they like a call back once the medication has been sent to the pharmacy? Yes   * THE TRANSMISSION THAT WAS SENT ON 8/12 TO THE PHARMACY FAILED*

## 2015-01-18 NOTE — Telephone Encounter (Signed)
Mrs. Delpozo is calling to find out if the potassium 10 meg is that something that is over the counter . Please leave a message on house phone ... Thanks

## 2015-01-18 NOTE — Telephone Encounter (Signed)
Patient would like to know if potassium 10 meq is something that she can purchase OTC

## 2015-01-19 NOTE — Telephone Encounter (Signed)
Left message to call back  

## 2015-01-19 NOTE — Telephone Encounter (Signed)
Sheri Dunn is returning a call, please call . Thanks

## 2015-01-20 NOTE — Telephone Encounter (Signed)
Spoke with patient and informed her potassium was sent to pharmacy on 01/18/15. She voiced understanding

## 2015-01-28 NOTE — Progress Notes (Signed)
Patient ID: Sheri Dunn, female   DOB: [birth year], 64 y.o.   MRN: [id number]     Cardiology Office Note   Date:  01/29/2015   ID:  Sheri Dunn, DOB [birth year], MRN [id number]  PCP:  No PCP Per Patient  Cardiologist:   Thurmon Fair, MD   Chief Complaint  Patient presents with  . Follow-up    Patient has no complaints.      History of Present Illness: [name] is a 64 y.o. female who presents for coronary artery disease complicated by severe ischemic cardiomyopathy. She initially presented with heart disease in April 2016 when she had a non-ST segment elevation myocardial infarction. She received a drug-eluting stent to the mid circumflex coronary artery and another drug-eluting stent to distal right coronary artery. Ejection fraction at that time was 20% by angiography, 25-30% by echocardiographyand failed to improve on medical therapy (still 20-25% on echo July 2016).  At he last office evaluation 08/12 she was markedly hypervolemic and diuretics, K supplement were started and ARB was increased.   her weight has stabilized at about 116 pounds, 18 pounds less than it was at her last appointment 2 weeks ago. Her lower showed edema is almost completely gone. She has mild residual ankle edema on the left side where she has very prominent varicose veins. She no longer has orthopnea, exertional dyspnea or coughing.  Her statin was stopped due to mildly elevated liver function tests (2-3 times upper limit of normal transaminases). She denies abdominal pain , change in bowel pattern, change in color of stools, hematemesis or melanoma, nausea or vomiting. While on atorvastatin her LDL was excellent at 50, HDL was still low at 31 , triglycerides 64.    Past Medical History  Diagnosis Date  . Hypertension   . HLD (hyperlipidemia)   . CAD (coronary artery disease)     a. NSTEMI 4/16:  LHC - mCFX 90, dRCA 90, EF 20% >> PCI: Synergy DES to mCFX and Synergy DES to dRCA  . Ischemic  cardiomyopathy     a. Echo 4/16:  EF 25-30%, diff HK, ant-septal HK, Gr 1 DD, mod AI, mod LAE, mild RVE;  b.  Echo 7/16:  EF 20-25%, severe HK, Gr 1 DD, mod AI, mod MR, mod LAE, mod RVE, mod reduced RVSF, mild RAE  . Chronic systolic CHF (congestive heart failure)   . Prolonged Q-T interval on ECG     Past Surgical History  Procedure Laterality Date  . Left heart catheterization with coronary angiogram N/A 09/08/2014    Procedure: LEFT HEART CATHETERIZATION WITH CORONARY ANGIOGRAM;  Surgeon: Corky Crafts, MD;  Location: [facility] CATH LAB;  Service: Cardiovascular;  Laterality: N/A;  . Cardiac catheterization Right 09/08/2014    Procedure: CORONARY STENT INTERVENTION;  Surgeon: Corky Crafts, MD;  Location: [facility] C[facility] LAB;  Service: Cardiovascular;  Laterality: Right;     Current Outpatient Prescriptions  Medication Sig Dispense Refill  . aspirin 81 MG chewable tablet Chew 1 tablet (81 mg total) by mouth daily. 30 tablet 3  . atorvastatin (LIPITOR) 20 MG tablet Take 1 tablet (20 mg total) by mouth daily. 30 tablet 11  . carvedilol (COREG) 25 MG tablet Take 1 tablet (25 mg total) by mouth 2 (two) times daily with a meal. 60 tablet 6  . Cholecalciferol (VITAMIN D3) 2000 UNITS TABS Take 2,000 Units by mouth daily.    . furosemide (LASIX) 40 MG tablet Take 1 tablet (40 mg total) by mouth daily. 90 tablet  3  . potassium chloride (K-DUR) 10 MEQ tablet Take 1 tablet (10 mEq total) by mouth daily. 90 tablet 3  . prasugrel (EFFIENT) 10 MG TABS tablet Take 1 tablet (10 mg total) by mouth daily. 30 tablet 6  . valsartan (DIOVAN) 320 MG tablet Take 1 tablet (320 mg total) by mouth daily. 30 tablet 6   No current facility-administered medications for this visit.    Allergies:   Review of patient's allergies indicates no known allergies.    Social History:  The patient  reports that she has never smoked. She does not have any smokeless tobacco history on file. She reports that she does not drink  alcohol or use illicit drugs.   Family History:  The patient's family history includes Heart attack in her mother; Stroke in her mother.    ROS:  Please see the history of present illness.    Otherwise, review of systems positive for none.   All other systems are reviewed and negative.    PHYSICAL EXAM: VS:  BP 116/68 mmHg  Pulse 71  Ht  (1.676 m)  Wt 116 lb (52.617 kg)  BMI 18.73 kg/m2 , BMI Body mass index is 18.73 kg/(m^2).  General: Alert, oriented x3, no distress Head: no evidence of trauma, PERRL, EOMI, no exophtalmos or lid lag, no myxedema, no xanthelasma; normal ears, nose and oropharynx Neck: normal jugular venous pulsations and no hepatojugular reflux; brisk carotid pulses without delay and no carotid bruits Chest: clear to auscultation, no signs of consolidation by percussion or palpation, normal fremitus, symmetrical and full respiratory excursions Cardiovascular:  Laterally displaced and somewhat heaving apical impulse, regular rhythm, normal first and second heart sounds,  Greatly reduced intensity of tricuspid regurgitation murmur, no rubs or gallops Abdomen: no tenderness or distention, no masses by palpation, no abnormal pulsatility or arterial bruits, normal bowel sounds, no hepatosplenomegaly Extremities: no clubbing, cyanosis;  1+ left ankle edema , no edema on the right side ; prominent varicose veins throughout the entire left leg; 2+ radial, ulnar and brachial pulses bilaterally; 2+ right femoral, posterior tibial and dorsalis pedis pulses; 2+ left femoral, posterior tibial and dorsalis pedis pulses; no subclavian or femoral bruits Neurological: grossly nonfocal Psych: euthymic mood, full affect   EKG:  EKG is not ordered today.   Recent Labs: 09/05/2014: B Natriuretic Peptide 4342.6*; TSH 1.940 09/08/2014: Magnesium 2.1 10/07/2014: BUN 24*; Creatinine, Ser 0.80; Hemoglobin 14.8; Platelets 143*; Potassium 4.4; Sodium 140 12/25/2014: ALT 81*    Lipid Panel     Component Value Date/Time   CHOL 94 12/11/2014 1000   TRIG 64.0 12/11/2014 1000   HDL 30.80* 12/11/2014 1000   CHOLHDL 3 12/11/2014 1000   VLDL 12.8 12/11/2014 1000   LDLCALC 50 12/11/2014 1000      Wt Readings from Last 3 Encounters:  01/29/15 116 lb (52.617 kg)  01/15/15 134 lb (60.782 kg)  11/20/14 121 lb 6.4 oz (55.067 kg)     .   ASSESSMENT AND PLAN:  1. Acute on chronic combined systolic and diastolic heart failure.  marked improvement in symptoms in reduction in evidence of hypervolemia. 116 pounds seems to be her "dry weight". She would like to gain some weight and we discussed the importance of daily weight monitoring and understanding the difference in fluid gain and true weight gain. I agree that she is undernourished with a BMI of only 19. We reviewed sodium restriction and I encouraged her to eat foods with high nutritional  Content, rich  in protein and unsaturated fat, lower in saturated fat and sodium.  today we will increase her valsartan to 320 mg daily. If her blood pressure permits and she tolerates this change well, in 3 weeks she will also increase her carvedilol to 25 mg twice a day. I warned her that this change may be associated with some worsening symptoms , for a brief period of time.  2. Severe ischemic cardiomyopathy with left ventricular ejection fraction 20-25 percent, not improved despite two-vessel revascularization and medical therapy for congestive heart failure. Echo shows evidence of severe to dysfunction of the left ventricle and moderate dysfunction of the right ventricle. She is at increased risk for sudden cardiac death. She meets criteria for Primary prevention ICD implantation (Prior myocardial infarction, left ventricular ejection fraction under 35%, heart failure NYHA class II-III, on comprehensive medical therapy). We spent a long time also discussing the pros and cons of ICD therapy , technical issues regarding the device, possibly  complications of the time of implantation or a long-term follow-up , remote monitoring , etc. We reviewed the fact the defibrillator will have no impact on quality of life but may have an impact on longevity.  she wants to put off implantation of a defibrillator until her retirement in October  3. CAD s/p DES to mid LCX and distal RCA April 2016.She is to remain on dual antiplatelets therapy until next April at least. If we go ahead with ICD in the near future, we'll not interrupt her Effient.  4. Hyperlipidemia. It is just as likely that her abnormal liver function tests are due to liver congestion from heart failure as they might bea sign of statin hepatotoxicity. Will reevaluate liver function tests now that she has been appropriately diuresed.  5. Moderate aortic insufficiency by echocardiography  6. Essential hypertension , controlled    Current medicines are reviewed at length with the patient today.  The patient does not have concerns regarding medicines.   Labs/ tests ordered today include:  No orders of the defined types were placed in this encounter.    Patient Instructions  Medication Instructions:   Today increase Valsartan to 320mg  daily.  In 3 weeks increase the Carvedilol to 25mg  twice daily.   Follow-Up:  Dr. Royann Shivers recommends that you schedule a follow-up appointment in: October    Any Other Special Instructions Will Be Listed Below (If Applicable).  Your physician recommends that you weigh, daily, at the same time every day, and in the same amount of clothing. Please record your daily weights on the handout provided and bring it to your next appointment.   Daily Weight Record It is important to weigh yourself daily. Keep this daily weight chart near your scale. Weigh yourself each morning at the same time. Weigh yourself without shoes and with the same amount of clothes each day. Compare today's weight to yesterday's weight. Bring this form with you to  your follow-up appointments. Call your caregiver if you gain 03 lb/1.4 kg in 1 day. Call your caregiver if you gain 05 lb/2.3 kg in a week. Date: ________ Weight: ____________________ Date: ________ Weight: ____________________ Date: ________ Weight: ____________________ Date: ________ Weight: ____________________ Date: ________ Weight: ____________________ Date: ________ Weight: ____________________ Date: ________ Weight: ____________________ Date: ________ Weight: ____________________ Date: ________ Weight: ____________________ Date: ________ Weight: ____________________ Date: ________ Weight: ____________________ Date: ________ Weight: ____________________ Date: ________ Weight: ____________________ Date: ________ Weight: ____________________ Date: ________ Weight: ____________________ Date: ________ Weight: ____________________ Date: ________ Weight: ____________________ Date: ________ Weight: ____________________ Date:  ________ Weight: ____________________ Date: ________ Weight: ____________________ Date: ________ Weight: ____________________ Date: ________ Weight: ____________________ Date: ________ Weight: ____________________ Date: ________ Weight: ____________________ Date: ________ Weight: ____________________ Date: ________ Weight: ____________________ Date: ________ Weight: ____________________ Date: ________ Weight: ____________________ Date: ________ Weight: ____________________ Date: ________ Weight: ____________________ Date: ________ Weight: ____________________ Date: ________ Weight: ____________________ Date: ________ Weight: ____________________ Date: ________ Weight: ____________________ Date: ________ Weight: ____________________ Date: ________ Weight: ____________________ Date: ________ Weight: ____________________ Date: ________ Weight: ____________________ Date: ________ Weight: ____________________ Date: ________ Weight: ____________________ Date: ________ Weight:  ____________________ Date: ________ Weight: ____________________ Date: ________ Weight: ____________________ Date: ________ Weight: ____________________ Date: ________ Weight: ____________________ Date: ________ Weight: ____________________ Date: ________ Weight: ____________________ Date: ________ Weight: ____________________ Date: ________ Weight: ____________________ Date: ________ Weight: ____________________ Document Released: 08/03/2006 Document Revised: 08/14/2011 Document Reviewed: 05/10/2007 Exit[employer] Patient Information 2015 [place]. This information is not intended to replace advice given to you by your health care provider. Make sure you discuss any questions you have with your health care provider.          Joie Bimler, MD  01/29/2015 6:35 PM    Thurmon Fair, MD, [facility] [phone] office [phone] pager

## 2015-01-29 ENCOUNTER — Ambulatory Visit (INDEPENDENT_AMBULATORY_CARE_PROVIDER_SITE_OTHER): Payer: Medicare HMO | Admitting: Cardiovascular Disease

## 2015-01-29 ENCOUNTER — Encounter: Payer: Self-pay | Admitting: Cardiovascular Disease

## 2015-01-29 VITALS — BP 116/68 | HR 71 | Ht 66.0 in | Wt 116.0 lb

## 2015-01-29 DIAGNOSIS — Z9189 Other specified personal risk factors, not elsewhere classified: Secondary | ICD-10-CM

## 2015-01-29 DIAGNOSIS — E785 Hyperlipidemia, unspecified: Secondary | ICD-10-CM

## 2015-01-29 DIAGNOSIS — I25118 Atherosclerotic heart disease of native coronary artery with other forms of angina pectoris: Secondary | ICD-10-CM | POA: Diagnosis not present

## 2015-01-29 DIAGNOSIS — I5042 Chronic combined systolic (congestive) and diastolic (congestive) heart failure: Secondary | ICD-10-CM | POA: Diagnosis not present

## 2015-01-29 DIAGNOSIS — I10 Essential (primary) hypertension: Secondary | ICD-10-CM | POA: Diagnosis not present

## 2015-01-29 DIAGNOSIS — R945 Abnormal results of liver function studies: Secondary | ICD-10-CM

## 2015-01-29 DIAGNOSIS — R7989 Other specified abnormal findings of blood chemistry: Secondary | ICD-10-CM

## 2015-01-29 DIAGNOSIS — I255 Ischemic cardiomyopathy: Secondary | ICD-10-CM

## 2015-01-29 DIAGNOSIS — I351 Nonrheumatic aortic (valve) insufficiency: Secondary | ICD-10-CM

## 2015-01-29 MED ORDER — CARVEDILOL 25 MG PO TABS: 25.0000 mg | ORAL_TABLET | Freq: Two times a day (BID) | ORAL | Status: DC

## 2015-01-29 MED ORDER — VALSARTAN 320 MG PO TABS: 320.0000 mg | ORAL_TABLET | Freq: Every day | ORAL | Status: DC

## 2015-01-29 NOTE — Patient Instructions (Signed)
Medication Instructions:   Today increase Valsartan to  daily.  In 3 weeks increase the Carvedilol to  twice daily.   Follow-Up:  Dr. Royann Shivers recommends that you schedule a follow-up appointment in: October    Any Other Special Instructions Will Be Listed Below (If Applicable).  Your physician recommends that you weigh, daily, at the same time every day, and in the same amount of clothing. Please record your daily weights on the handout provided and bring it to your next appointment.   Daily Weight Record It is important to weigh yourself daily. Keep this daily weight chart near your scale. Weigh yourself each morning at the same time. Weigh yourself without shoes and with the same amount of clothes each day. Compare today's weight to yesterday's weight. Bring this form with you to your follow-up appointments. Call your caregiver if you gain 03 lb/1.4 kg in 1 day. Call your caregiver if you gain 05 lb/2.3 kg in a week. Date: ________ Weight: ____________________ Date: ________ Weight: ____________________ Date: ________ Weight: ____________________ Date: ________ Weight: ____________________ Date: ________ Weight: ____________________ Date: ________ Weight: ____________________ Date: ________ Weight: ____________________ Date: ________ Weight: ____________________ Date: ________ Weight: ____________________ Date: ________ Weight: ____________________ Date: ________ Weight: ____________________ Date: ________ Weight: ____________________ Date: ________ Weight: ____________________ Date: ________ Weight: ____________________ Date: ________ Weight: ____________________ Date: ________ Weight: ____________________ Date: ________ Weight: ____________________ Date: ________ Weight: ____________________ Date: ________ Weight: ____________________ Date: ________ Weight: ____________________ Date: ________ Weight: ____________________ Date: ________ Weight: ____________________ Date:  ________ Weight: ____________________ Date: ________ Weight: ____________________ Date: ________ Weight: ____________________ Date: ________ Weight: ____________________ Date: ________ Weight: ____________________ Date: ________ Weight: ____________________ Date: ________ Weight: ____________________ Date: ________ Weight: ____________________ Date: ________ Weight: ____________________ Date: ________ Weight: ____________________ Date: ________ Weight: ____________________ Date: ________ Weight: ____________________ Date: ________ Weight: ____________________ Date: ________ Weight: ____________________ Date: ________ Weight: ____________________ Date: ________ Weight: ____________________ Date: ________ Weight: ____________________ Date: ________ Weight: ____________________ Date: ________ Weight: ____________________ Date: ________ Weight: ____________________ Date: ________ Weight: ____________________ Date: ________ Weight: ____________________ Date: ________ Weight: ____________________ Date: ________ Weight: ____________________ Date: ________ Weight: ____________________ Date: ________ Weight: ____________________ Date: ________ Weight: ____________________ Date: ________ Weight: ____________________ Document Released: 08/03/2006 Document Revised: 08/14/2011 Document Reviewed: 05/10/2007 ExitCare Patient Information 2015 [place] [employer]. This information is not intended to replace advice given to you by your health care provider. Make sure you discuss any questions you have with your health care provider.

## 2015-02-01 ENCOUNTER — Telehealth: Payer: Self-pay | Admitting: Cardiovascular Disease

## 2015-02-01 NOTE — Telephone Encounter (Signed)
Pt called in stating that Dr.C increased her BP meds on Friday and now she is complaining of being sleepy, drowsy, and she is staggering into things when she is walking. Please f/u with her  Thanks

## 2015-02-01 NOTE — Telephone Encounter (Signed)
Valsartan was increased to  once daily.  She reports she is sleepy, drowsy, staggering into stuff.  Patient reports she took the increased dose for ONE day and then went back to previous dose.  Per office visit note "I warned her that this change may be associated with some worsening symptoms , for a brief period of time." Asked patient about this and she recalls him going over this - she states he told her to call if she had symptoms.   Routed to MD to review and advise.

## 2015-02-01 NOTE — Telephone Encounter (Signed)
LMTCB

## 2015-02-02 NOTE — Telephone Encounter (Signed)
Left message to call back  

## 2015-02-02 NOTE — Telephone Encounter (Signed)
OK, let's not change meds then. Do not increase carvedilol either, as we had planned at office visit

## 2015-02-02 NOTE — Telephone Encounter (Signed)
LMTCB

## 2015-02-02 NOTE — Telephone Encounter (Signed)
Patient is returning a call from the nurse

## 2015-02-03 ENCOUNTER — Telehealth: Payer: Self-pay | Admitting: *Deleted

## 2015-02-03 DIAGNOSIS — Z79899 Other long term (current) drug therapy: Secondary | ICD-10-CM

## 2015-02-03 NOTE — Telephone Encounter (Signed)
-----   Message from Thurmon Fair, MD sent at 01/29/2015  6:40 PM EDT -----  Please have her check a CMET  Before her next appointment

## 2015-02-03 NOTE — Telephone Encounter (Signed)
LVMTCB..Want to relay instructions from Dr. Royann Shivers

## 2015-02-03 NOTE — Telephone Encounter (Signed)
Lab order mailed to patient to have done prior to next visit.

## 2015-02-03 NOTE — Telephone Encounter (Signed)
Spoke with patient and notified her of MD's recommendations. She voiced understanding.

## 2015-02-15 ENCOUNTER — Telehealth: Payer: Self-pay | Admitting: Cardiovascular Disease

## 2015-02-15 NOTE — Telephone Encounter (Signed)
Please call at 3 today,concerning the dosage of her Carvedilol.

## 2015-02-16 NOTE — Telephone Encounter (Signed)
Returned call to patient no answer.LMTC. 

## 2015-02-16 NOTE — Telephone Encounter (Signed)
Pt called in wanting to know which strength of Carvedilol Dr. Salena Saner wants her to start taking . Please call  Thanks

## 2015-02-17 ENCOUNTER — Telehealth: Payer: Self-pay | Admitting: Cardiovascular Disease

## 2015-02-17 MED ORDER — CARVEDILOL 12.5 MG PO TABS: 12.5000 mg | ORAL_TABLET | Freq: Two times a day (BID) | ORAL | Status: DC

## 2015-02-17 NOTE — Telephone Encounter (Signed)
She is to stay on 12.5 mg BID. She developed low BP when we tried to increase the ACE inhibitor so we decided to avoid increasing the carvedilol andy further

## 2015-02-17 NOTE — Telephone Encounter (Signed)
Patient called and confused as to what dose of Coreg she should be taking  Currently is taking 6.25 mg.  She said this was the strength she just picked up  09/07/14 at discharge Coreg 6.25 mg bid 10/06/14 elevated BP  Coreg increased  To 12.5 mg bid 01/29/15 OV is to increase Coreg to 25 mg bid in 3 weeks = 02/19/15  Called patient [facility] on [address] to make sure they have the most current prescription and that the dose dispensed is Coreg 25 mg twice a day

## 2015-02-17 NOTE — Telephone Encounter (Signed)
[facility], patient and changed the medication to Coreg 12.5 mg twice a daily in the  System  LVMTCB for update on medication

## 2015-02-17 NOTE — Telephone Encounter (Signed)
Returning Call.

## 2015-02-18 ENCOUNTER — Telehealth: Payer: Self-pay | Admitting: Cardiovascular Disease

## 2015-02-18 NOTE — Telephone Encounter (Signed)
LVMTCB

## 2015-02-18 NOTE — Telephone Encounter (Signed)
Told patient to start taking Coreg 12.5 mg twice a daily

## 2015-02-18 NOTE — Telephone Encounter (Signed)
Pt is returning [facility] Jo's call. She said that she will not be able to answer the phone until  After 12:30 because that is when she goes on break at work.  Thanks

## 2015-02-26 ENCOUNTER — Ambulatory Visit: Payer: PRIVATE HEALTH INSURANCE | Admitting: Gastroenterology

## 2015-03-01 ENCOUNTER — Encounter: Payer: Self-pay | Admitting: Gastroenterology

## 2015-03-01 ENCOUNTER — Ambulatory Visit (INDEPENDENT_AMBULATORY_CARE_PROVIDER_SITE_OTHER): Payer: PRIVATE HEALTH INSURANCE | Admitting: Gastroenterology

## 2015-03-01 ENCOUNTER — Other Ambulatory Visit (INDEPENDENT_AMBULATORY_CARE_PROVIDER_SITE_OTHER): Payer: PRIVATE HEALTH INSURANCE

## 2015-03-01 VITALS — BP 108/60 | HR 72 | Ht 64.17 in | Wt 124.0 lb

## 2015-03-01 DIAGNOSIS — I429 Cardiomyopathy, unspecified: Secondary | ICD-10-CM

## 2015-03-01 DIAGNOSIS — R740 Nonspecific elevation of levels of transaminase and lactic acid dehydrogenase [LDH]: Secondary | ICD-10-CM

## 2015-03-01 DIAGNOSIS — R7401 Elevation of levels of liver transaminase levels: Secondary | ICD-10-CM

## 2015-03-01 LAB — IBC PANEL
IRON: 48 ug/dL (ref 42–145)
SATURATION RATIOS: 11.9 % — AB (ref 20.0–50.0)
Transferrin: 288 mg/dL (ref 212.0–360.0)

## 2015-03-01 NOTE — Patient Instructions (Signed)
Your physician has requested that you go to the basement for lab work before leaving today.  You have been scheduled for an abdominal ultrasound at [facility] (1st floor of hospital) on 03/05/2015 at 9:00am. Please arrive 15 minutes prior to your appointment for registration. Make certain not to have anything to eat or drink 6 hours prior to your appointment. Should you need to reschedule your appointment, please contact radiology at [phone]. This test typically takes about 30 minutes to perform.

## 2015-03-01 NOTE — Progress Notes (Signed)
HPI :  64 y/o female seen in consultation for elevated liver enzymes for Dr. Rachelle Hora Croitoru. The patient has had an elevated ALT > AST since April 2016. No previous labs available. She was admitted in April for an MI and underwent workup which led to 2 coronary stents placed at the time. She has had labs followed during this course with a fluctuating ALT > AST noted on multiple blood draws. The patient reports this is a new finding, she denies history of previous abnormal LFTs or personal history of liver disease. She denies FH of liver diseases. No history of jaundice. She has had some LE edema since she had her MI, with recent echocardiogram showing an EF of 20-25%. She is being evaluated for ICD. She denies abdominal ascites.  She takes vitamin D supplement but otherwise no OTCs or herbal supplements. She denies any alcohol use. She was previously on higher dose lipitor  prior to her hospitalization, she had it held for 2 weeks in the past 1-2 months, and then resumed it at  in the past month or so. Since her coronary stents were placed, she has been on Effient, lasix, potassium, and baby aspirin which are all new since that time. She denies any alcohol use.   Of note, the patient's last colonoscopy was 10 years ago, per patient, and she thinks it was normal but was told to repeat this year. She denies blood in the stools or bowel habits changes. No anemia.    The patient otherwise does endorse some dyspnea on exertion. No chest pains.    Past Medical History  Diagnosis Date  . Hypertension   . HLD (hyperlipidemia)   . CAD (coronary artery disease)     a. NSTEMI 4/16:  LHC - mCFX 90, dRCA 90, EF 20% >> PCI: Synergy DES to mCFX and Synergy DES to dRCA  . Ischemic cardiomyopathy     a. Echo 4/16:  EF 25-30%, diff HK, ant-septal HK, Gr 1 DD, mod AI, mod LAE, mild RVE;  b.  Echo 7/16:  EF 20-25%, severe HK, Gr 1 DD, mod AI, mod MR, mod LAE, mod RVE, mod reduced RVSF, mild RAE  . Chronic  systolic CHF (congestive heart failure)   . Prolonged Q-T interval on ECG   . Elevated ALT measurement      Past Surgical History  Procedure Laterality Date  . Left heart catheterization with coronary angiogram N/A 09/08/2014    Procedure: LEFT HEART CATHETERIZATION WITH CORONARY ANGIOGRAM;  Surgeon: Corky Crafts, MD;  Location: [facility] CATH LAB;  Service: Cardiovascular;  Laterality: N/A;  . Cardiac catheterization Right 09/08/2014    Procedure: CORONARY STENT INTERVENTION;  Surgeon: Corky Crafts, MD;  Location: [facility] CATH LAB;  Service: Cardiovascular;  Laterality: Right;   Family History  Problem Relation Age of Onset  . Heart attack Mother   . Stroke Mother   . Cystic fibrosis Sister   . Diabetes Mother    Social History  Substance Use Topics  . Smoking status: Never Smoker   . Smokeless tobacco: Never Used  . Alcohol Use: No   Current Outpatient Prescriptions  Medication Sig Dispense Refill  . aspirin 81 MG chewable tablet Chew 1 tablet (81 mg total) by mouth daily. 30 tablet 3  . atorvastatin (LIPITOR) 20 MG tablet Take 1 tablet (20 mg total) by mouth daily. 30 tablet 11  . carvedilol (COREG) 12.5 MG tablet Take 1 tablet (12.5 mg total) by mouth 2 (two) times  daily with a meal. 180 tablet 3  . Cholecalciferol (VITAMIN D3) 2000 UNITS TABS Take 2,000 Units by mouth daily.    . furosemide (LASIX) 40 MG tablet Take 1 tablet (40 mg total) by mouth daily. 90 tablet 3  . potassium chloride (K-DUR) 10 MEQ tablet Take 1 tablet (10 mEq total) by mouth daily. 90 tablet 3  . prasugrel (EFFIENT) 10 MG TABS tablet Take 1 tablet (10 mg total) by mouth daily. 30 tablet 6  . valsartan (DIOVAN) 320 MG tablet Take 1 tablet (320 mg total) by mouth daily. 30 tablet 6   No current facility-administered medications for this visit.   No Known Allergies   Review of Systems: All systems reviewed and negative except where noted in HPI.    All available labs from Epic reviewed. ALT has  fluctuated from high 100s to 50s, with milder elevation in AST and AP.   10/07/2014 - CT abdomen - IMPRESSION: No acute findings are evident in the abdomen or pelvis. Right inguinal hernia containing unobstructed ileum Colonic diverticulosis Calcified uterine fibroids  Physical Exam: BP 108/60 mmHg  Pulse 72  Ht 5' 4.17" (1.63 m)  Wt 124 lb (56.246 kg)  BMI 21.17 kg/m2 Constitutional: Pleasant,well-developed, AA female in no acute distress. HEENT: Normocephalic and atraumatic. Conjunctivae are normal. No scleral icterus. Neck supple. , very prominent jugular venous pulse Cardiovascular: Normal rate, regular rhythm.  Pulmonary/chest: Effort normal and breath sounds normal. No wheezing, rales or rhonchi. Abdominal: Soft, nontender, no appreciable ascites. Bowel sounds active throughout. There are no masses palpable. No hepatomegaly. Extremities: (+) 2 LE edema bilaterally Lymphadenopathy: No cervical adenopathy noted. Neurological: Alert and oriented to person place and time. Skin: Skin is warm and dry. No rashes noted. Psychiatric: Normal mood and affect. Behavior is normal.   ASSESSMENT AND PLAN: 64 y/o female with significant cardiac history (recent MI, multiple coronary stents, ischemic cardiomyopathy with EF 20-25%) referred for ALT > AST elevation ongoing since April as described above. Unclear if she has had LFT elevation prior to April as no records of labs available to review today. CT abdomen in May did not show steatosis or other parenchymal abnormality. She has prominent LE edema on exam. Differential includes congestive hepatopathy from cardiomyopathy, drug induced liver injury (statin seems less likely, has been on this for some time, and not on other typical medications to cause this), vs. other chronic liver diseases. Recommend basic labs to rule out other chronic liver diseases at this time and will repeat LFTs to see if she has had any improvement in ALT since maximizing her  medical therapy for her cardiomyopathy. I'll otherwise order a RUQ [place] with doppler to assess for congestive changes and check for interval changes since last imaged given this finding has persisted. Further recommendations pending these results. Okay to continue statin at present dose as long as ALT remains stable  < 3 x's ULN.   Otherwise patient is due for CRC screening she thinks in 2016, based on her last exam 10 years ago. She has no anemia and no bowel symptoms presently. Given ongoing cardiac issues, need for at least one year of antiplatelet therapy after stenting would recommend holding off on screening exam until she is farther out from her MI, unless she develops symptoms in the interim. She agreed, all questions answered.   Ileene Patrick, MD [facility]  CC: Dr. Thurmon Fair

## 2015-03-02 LAB — FERRITIN: Ferritin: 36 ng/mL (ref 15–150)

## 2015-03-02 LAB — HEPATIC FUNCTION PANEL
ALBUMIN: 3.5 g/dL — AB (ref 3.6–4.8)
ALK PHOS: 152 IU/L — AB (ref 39–117)
ALT: 49 IU/L — ABNORMAL HIGH (ref 0–32)
AST: 37 IU/L (ref 0–40)
BILIRUBIN, DIRECT: 0.19 mg/dL (ref 0.00–0.40)
Bilirubin Total: 0.6 mg/dL (ref 0.0–1.2)
TOTAL PROTEIN: 6.2 g/dL (ref 6.0–8.5)

## 2015-03-02 LAB — ALPHA-1-ANTITRYPSIN: A1 ANTITRYPSIN: 189 mg/dL (ref 90–200)

## 2015-03-02 LAB — MITOCHONDRIAL ANTIBODIES: Mitochondrial Ab: 4.3 Units (ref 0.0–20.0)

## 2015-03-02 LAB — ANA: Anti Nuclear Antibody(ANA): NEGATIVE

## 2015-03-02 LAB — HEPATITIS B SURFACE ANTIGEN: Hepatitis B Surface Ag: NEGATIVE

## 2015-03-02 LAB — ANTI-SMOOTH MUSCLE ANTIBODY, IGG: SMOOTH MUSCLE AB: 10 U (ref 0–19)

## 2015-03-02 LAB — HEPATITIS C ANTIBODY

## 2015-03-02 LAB — IGG: IGG (IMMUNOGLOBIN G), SERUM: 1079 mg/dL (ref 700–1600)

## 2015-03-02 LAB — CERULOPLASMIN: Ceruloplasmin: 26.7 mg/dL (ref 19.0–39.0)

## 2015-03-05 ENCOUNTER — Ambulatory Visit (HOSPITAL_COMMUNITY): Payer: Medicare HMO

## 2015-03-08 ENCOUNTER — Other Ambulatory Visit: Payer: Self-pay | Admitting: *Deleted

## 2015-03-08 DIAGNOSIS — R7989 Other specified abnormal findings of blood chemistry: Secondary | ICD-10-CM

## 2015-03-08 DIAGNOSIS — R945 Abnormal results of liver function studies: Secondary | ICD-10-CM

## 2015-03-09 ENCOUNTER — Ambulatory Visit (HOSPITAL_COMMUNITY)
Admission: RE | Admit: 2015-03-09 | Discharge: 2015-03-09 | Disposition: A | Discharge status: Home / Self Care | Payer: Medicare HMO | Source: Ambulatory Visit | Attending: Gastroenterology | Admitting: Gastroenterology

## 2015-03-09 DIAGNOSIS — J90 Pleural effusion, not elsewhere classified: Secondary | ICD-10-CM | POA: Insufficient documentation

## 2015-03-09 DIAGNOSIS — R740 Nonspecific elevation of levels of transaminase and lactic acid dehydrogenase [LDH]: Secondary | ICD-10-CM | POA: Insufficient documentation

## 2015-03-09 DIAGNOSIS — R7401 Elevation of levels of liver transaminase levels: Secondary | ICD-10-CM

## 2015-03-10 ENCOUNTER — Other Ambulatory Visit: Payer: Self-pay | Admitting: *Deleted

## 2015-03-10 DIAGNOSIS — R7989 Other specified abnormal findings of blood chemistry: Secondary | ICD-10-CM

## 2015-03-10 DIAGNOSIS — R945 Abnormal results of liver function studies: Secondary | ICD-10-CM

## 2015-03-31 LAB — COMPREHENSIVE METABOLIC PANEL
ALBUMIN: 3.3 g/dL — AB (ref 3.6–5.1)
ALK PHOS: 188 U/L — AB (ref 33–130)
ALT: 40 U/L — ABNORMAL HIGH (ref 6–29)
AST: 34 U/L (ref 10–35)
BUN: 25 mg/dL (ref 7–25)
CHLORIDE: 107 mmol/L (ref 98–110)
CO2: 24 mmol/L (ref 20–31)
CREATININE: 1 mg/dL — AB (ref 0.50–0.99)
Calcium: 8.9 mg/dL (ref 8.6–10.4)
Glucose, Bld: 96 mg/dL (ref 65–99)
POTASSIUM: 4.8 mmol/L (ref 3.5–5.3)
Sodium: 140 mmol/L (ref 135–146)
TOTAL PROTEIN: 6.4 g/dL (ref 6.1–8.1)
Total Bilirubin: 0.7 mg/dL (ref 0.2–1.2)

## 2015-04-01 ENCOUNTER — Encounter: Payer: Self-pay | Admitting: Cardiovascular Disease

## 2015-04-01 ENCOUNTER — Ambulatory Visit (INDEPENDENT_AMBULATORY_CARE_PROVIDER_SITE_OTHER): Payer: Medicare HMO | Admitting: Cardiovascular Disease

## 2015-04-01 VITALS — BP 124/82 | HR 75 | Resp 20 | Ht 64.0 in | Wt 124.0 lb

## 2015-04-01 DIAGNOSIS — Z01818 Encounter for other preprocedural examination: Secondary | ICD-10-CM

## 2015-04-01 DIAGNOSIS — I255 Ischemic cardiomyopathy: Secondary | ICD-10-CM | POA: Diagnosis not present

## 2015-04-01 DIAGNOSIS — E785 Hyperlipidemia, unspecified: Secondary | ICD-10-CM

## 2015-04-01 DIAGNOSIS — I25118 Atherosclerotic heart disease of native coronary artery with other forms of angina pectoris: Secondary | ICD-10-CM

## 2015-04-01 DIAGNOSIS — Z01811 Encounter for preprocedural respiratory examination: Secondary | ICD-10-CM

## 2015-04-01 DIAGNOSIS — I5043 Acute on chronic combined systolic (congestive) and diastolic (congestive) heart failure: Secondary | ICD-10-CM

## 2015-04-01 DIAGNOSIS — I10 Essential (primary) hypertension: Secondary | ICD-10-CM

## 2015-04-01 DIAGNOSIS — Z79899 Other long term (current) drug therapy: Secondary | ICD-10-CM

## 2015-04-01 MED ORDER — FUROSEMIDE 40 MG PO TABS: 80.0000 mg | ORAL_TABLET | Freq: Every day | ORAL | Status: DC

## 2015-04-01 NOTE — Progress Notes (Signed)
Patient ID: Sheri Dunn, female   DOB: [birth year], 64 y.o.   MRN: [id number]     Cardiology Office Note   Date:  04/01/2015   ID:  Sheri Dunn, DOB 02-24-1951, MRN [id number]  PCP:  No PCP Per Patient  Cardiologist:   Thurmon Fair, MD   Chief Complaint  Patient presents with  . 2 MONTH FOLLOW UP    LAB RESULTS  . Fatigue  . Shortness of Breath  . Edema    legs, feet, and ankles      History of Present Illness: Sheri Dunn is a 64 y.o. female who presents for  Follow-up of congestive heart failure due to ischemic cardiomyopathy and to discuss implantation of a defibrillator.   after increasing her dose of carvedilol she has developed worsening symptoms of congestive heart failure. She has a cough when in supine position, although she does not have frank orthopnea. She has exertional dyspnea on minimal activity (NYHA functional class III). When she wakes up in the morning she does not have edema but she develops fairly severe ankle swelling by the end of the day. Her last echocardiogram shows left ventricular ejection fraction 20-25 percent.  She denies angina pectoris, focal neurological deficits, intermittent claudication or any bleeding problems. She has not had fever or chills.   Although at her last appointment I had recommended increasing valsartan to 320 mg, it appears she is still taking only 160 mg. She has not been weighing herself daily. Her weight is actually not changed from her GI appointment in September, but is about 8 pounds higher than it was a few weeks before that, ather last cardiology appointment , I believe the difference is all fluid. Her electrocardiogram shows normal sinus rhythm.  Abnormal liver function test led to a GI evaluation. Right upper quadrant ultrasound did not show evidence of significant structural liver or biliary disease. Transaminases are improving , although the alkaline phosphatase is still a little abnormal. 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  Past Medical History  Diagnosis Date  . Hypertension   . HLD (hyperlipidemia)   . CAD (coronary artery disease)     a. NSTEMI 4/16:  LHC - mCFX 90, dRCA 90, EF 20% >> PCI: Synergy DES to mCFX and Synergy DES to dRCA  . Ischemic cardiomyopathy     a. Echo 4/16:  EF 25-30%, diff HK, ant-septal HK, Gr 1 DD, mod AI, mod LAE, mild RVE;  b.  Echo 7/16:  EF 20-25%, severe HK, Gr 1 DD, mod AI, mod MR, mod LAE, mod RVE, mod reduced RVSF, mild RAE  . Chronic systolic CHF (congestive heart failure) (HCC)   . Prolonged Q-T interval on ECG   . Elevated ALT measurement     Past Surgical History  Procedure Laterality Date  . Left heart catheterization with coronary angiogram N/A 09/08/2014    Procedure: LEFT HEART CATHETERIZATION WITH CORONARY ANGIOGRAM;  Surgeon: Corky Crafts, MD;  Location: [facility] CATH LAB;  Service: Cardiovascular;  Laterality: N/A;  . Cardiac catheterization Right 09/08/2014    Procedure: CORONARY STENT INTERVENTION;  Surgeon: Corky Crafts, MD;  Location: [facility] CATH LAB;  Service: Cardiovascular;  Laterality: Right;     Current Outpatient Prescriptions  Medication Sig Dispense Refill  . aspirin 81 MG chewable tablet Chew 1 tablet (81 mg total) by mouth daily. 30 tablet 3  . atorvastatin (LIPITOR) 20 MG tablet Take 1 tablet (20 mg total) by mouth daily. 30 tablet 11  . carvedilol (  COREG) 12.5 MG tablet Take 1 tablet (12.5 mg total) by mouth 2 (two) times daily with a meal. 180 tablet 3  . Cholecalciferol (VITAMIN D3) 2000 UNITS TABS Take 2,000 Units by mouth daily.    . furosemide (LASIX) 40 MG tablet Take 2 tablets (80 mg total) by mouth daily. WHEN YOUR WEIGHT REACHES 117 LBS REDUCE BACK TO  DAILY 90 tablet 3  . potassium chloride (K-DUR) 10 MEQ tablet Take 1 tablet (10 mEq total) by mouth daily. 90 tablet 3  . prasugrel (EFFIENT) 10 MG TABS tablet Take 1 tablet (10 mg total) by mouth daily. 30 tablet 6  . valsartan (DIOVAN) 320 MG  tablet Take 1 tablet (320 mg total) by mouth daily. (Patient taking differently: Take 160 mg by mouth daily. ) 30 tablet 6   No current facility-administered medications for this visit.    Allergies:   Review of patient's allergies indicates no known allergies.    Social History:  The patient  reports that she has never smoked. She has never used smokeless tobacco. She reports that she does not drink alcohol or use illicit drugs.   Family History:  The patient's family history includes Cystic fibrosis in her sister; Diabetes in her mother; Heart attack in her mother; Stroke in her mother.    ROS:  Please see the history of present illness.    Otherwise, review of systems positive for  Frustration with inability  To gain real weight. She denies abdominal pain, nausea, vomiting, melena, hematemesis , sputum production, hemoptysis, abdominal pain, urinary symptoms.   All other systems are reviewed and negative.    PHYSICAL EXAM: VS:  BP 124/82 mmHg  Pulse 75  Ht  (1.626 m)  Wt 124 lb (56.246 kg)  BMI 21.27 kg/m2 , BMI Body mass index is 21.27 kg/(m^2).  General: Alert, oriented x3, no distress Head: no evidence of trauma, PERRL, EOMI, no exophtalmos or lid lag, no myxedema, no xanthelasma; normal ears, nose and oropharynx Neck: 9-10 cm jugular venous pulsation  Relation, almost to the angle of the jaw and no additional hepatojugular reflux; brisk carotid pulses without delay and no carotid bruits Chest: clear to auscultation, no signs of consolidation by percussion or palpation, normal fremitus, symmetrical and full respiratory excursions Cardiovascular:  Laterally displaced the apical impulse, regular rhythm, normal first and second heart sounds, no murmurs, rubs, +ve S3 gallop Abdomen: no tenderness or distention, no masses by palpation, no abnormal pulsatility or arterial bruits, normal bowel sounds, no hepatosplenomegaly Extremities: no clubbing, cyanosis;  Symmetrical 2+ ankle  and pretibial edema; 2+ radial, ulnar and brachial pulses bilaterally; 2+ right femoral, posterior tibial and dorsalis pedis pulses; 2+ left femoral, posterior tibial and dorsalis pedis pulses; no subclavian or femoral bruits Neurological: grossly nonfocal Psych: euthymic mood, full affect   EKG:  EKG is ordered today. The ekg ordered today demonstrates   normal sinus rhythm, left ventricular hypertrophy, borderline QTC 462 ms   Recent Labs: 09/05/2014: B Natriuretic Peptide 4342.6*; TSH 1.940 09/08/2014: Magnesium 2.1 10/07/2014: Hemoglobin 14.8; Platelets 143* 03/30/2015: ALT 40*; BUN 25; Creat 1.00*; Potassium 4.8; Sodium 140    Lipid Panel    Component Value Date/Time   CHOL 94 12/11/2014 1000   TRIG 64.0 12/11/2014 1000   HDL 30.80* 12/11/2014 1000   CHOLHDL 3 12/11/2014 1000   VLDL 12.8 12/11/2014 1000   LDLCALC 50 12/11/2014 1000      Wt Readings from Last 3 Encounters:  04/01/15 124 lb (56.246 kg)  03/01/15 124 lb (56.246 kg)  01/29/15 116 lb (52.617 kg)    .   ASSESSMENT AND PLAN:   1. Acute on chronic combined systolic and diastolic heart failure.  decompensation may be secondary to increase in carvedilol dose , and she is obviously hypervolemic. 116 pounds seems to be her "dry weight".  Again, we discussed the importance of daily weight monitoring and understanding the difference in fluid gain and true weight gain. She is undernourished with a BMI of only 19. We reviewed sodium restriction and I encouraged her to eat foods with high nutritionalcontent, rich in protein and unsaturated fat, lower in saturated fat and sodium. She did not yet increase her valsartan to 320 mg daily. We'll try to maintain her on the higher dose of carvedilol.  Increase furosemide to 80 mg daily starting today.  Reevaluate her volume status in a couple of weeks. If better, she may be able to undergo defibrillator implantation on Tuesday, November 22. If heart failure persists, will delay  the procedure further.  2. Severe ischemic cardiomyopathy with left ventricular ejection fraction 20-25%, not improved despite two-vessel revascularization and medical therapy for congestive heart failure. Echo shows evidence of severe to dysfunction of the left ventricle and moderate dysfunction of the right ventricle. She is at increased risk for sudden cardiac death. She meets criteria for Primary prevention ICD implantation (Prior myocardial infarction, left ventricular ejection fraction under 35%, heart failure NYHA class II-III, on comprehensive medical therapy). We have already spent a long time discussing the pros and cons of ICD therapy , technical issues regarding the device, possible complications of the time of implantation, long-term follow-up, remote monitoring , etc. We reviewed the fact the defibrillator will have no impact on quality of life but may have an impact on longevity.  3. CAD s/p DES to mid LCX and distal RCA April 2016.She is to remain on dual antiplatelet therapy until next April at least.We will not interrupt her Effient for the ICD implantation.  4. Hyperlipidemia. It is most likely that her abnormal liver function tests are due to liver congestion from heart failure, rather than statin hepatotoxicity.   5. Moderate aortic insufficiency by echocardiography.  Not so obvious clinically.  6. Essential hypertension , controlled   Current medicines are reviewed at length with the patient today.  The patient has concerns regarding medicines.  The following changes have been made:   Increase furosemide to 80 mg daily. She has already taken 40 mg this morning, she'll take 40 mg when she gets home.  Labs/ tests ordered today include:  Orders Placed This Encounter  Procedures  . APTT  . Protime-INR  . Comprehensive metabolic panel  . CBC  . EKG 12-Lead  . IMPLANTABLE CARDIOVERTER DEFIBRILLATOR IMPLANT    Patient Instructions  Your physician has recommended you  make the following change in your medication: INCREASE FUROSEMIDE TO 80 MG DAILY UNTIL YOUR WEIGHT IS DOWN TO 117 LBS THEN GO BACK TO 40 MG DAILY.  Dr. Royann Shivers recommends that you schedule a follow-up appointment in: 2 WEEKS  Your physician has recommended that you have a defibrillator( DUAL CHAMBER [employer]) 03/27/15  inserted. An implantable cardioverter defibrillator (ICD) is a small device that is placed in your chest or, in rare cases, your abdomen. This device uses electrical pulses or shocks to help control life-threatening, irregular heartbeats that could lead the heart to suddenly stop beating (sudden cardiac arrest). Leads are attached to the ICD that goes into your heart. This  is done in the hospital and usually requires an overnight stay. Please see the instruction sheet given to you today for more information.  Your physician recommends that you return for lab work in: PRIOR TO IMPLANTATION OF THE DEFIBRILLATOR           Signed, Thurmon FairROITORU,[place] Surges, MD  04/01/2015 8:20 PM    Thurmon FairMihai Judene Logue, MD, [facility] [phone] office [phone] pager

## 2015-04-01 NOTE — Patient Instructions (Signed)
Your physician has recommended you make the following change in your medication: INCREASE FUROSEMIDE TO 80 MG DAILY UNTIL YOUR WEIGHT IS DOWN TO 117 LBS THEN GO BACK TO 40 MG DAILY.  Dr. Royann Shiversroitoru recommends that you schedule a follow-up appointment in: 2 WEEKS  Your physician has recommended that you have a defibrillator( DUAL CHAMBER [employer]TRON[employer]) 03/27/15  inserted. An implantable cardioverter defibrillator (ICD) is a small device that is placed in your chest or, in rare cases, your abdomen. This device uses electrical pulses or shocks to help control life-threatening, irregular heartbeats that could lead the heart to suddenly stop beating (sudden cardiac arrest). Leads are attached to the ICD that goes into your heart. This is done in the hospital and usually requires an overnight stay. Please see the instruction sheet given to you today for more information.  Your physician recommends that you return for lab work in: PRIOR TO IMPLANTATION OF THE DEFIBRILLATOR

## 2015-04-02 ENCOUNTER — Other Ambulatory Visit: Payer: Self-pay | Admitting: *Deleted

## 2015-04-02 DIAGNOSIS — I255 Ischemic cardiomyopathy: Secondary | ICD-10-CM

## 2015-04-05 ENCOUNTER — Other Ambulatory Visit: Payer: Self-pay

## 2015-04-05 ENCOUNTER — Telehealth: Payer: Self-pay | Admitting: Cardiovascular Disease

## 2015-04-05 DIAGNOSIS — Z1231 Encounter for screening mammogram for malignant neoplasm of breast: Secondary | ICD-10-CM

## 2015-04-05 NOTE — Telephone Encounter (Signed)
Spoke with patient. She reports she is unsure when she should be weighing each day so she knows how to take her lasix. She states she has been waiting til around 11am to weigh and has eaten and such before weighing. She states she did not know when to weigh so she did this cause when she first wakes up her feet are not swollen but they swelling later on in the day. Advised patient to weigh daily when she gets up in the AM before eating/drinking anything in about the same amount of clothes. She voiced understanding.

## 2015-04-05 NOTE — Telephone Encounter (Signed)
Pt called in wanting to speak with a nurse about when is a good time for her to take her Lasix. She says that when she wakes up first thing in the morning her legs are swollen but after around 11 and she has been moving around is when she starts to swell. Please f/u with her  Thanks

## 2015-04-08 ENCOUNTER — Telehealth: Payer: Self-pay | Admitting: *Deleted

## 2015-04-08 NOTE — Telephone Encounter (Signed)
-----   Message from Daphine Deutscheregina N Cj Edgell, RN sent at 03/08/2015  8:48 AM EDT ----- Call and remind patient due for LFT on 04/12/15 SA.

## 2015-04-08 NOTE — Telephone Encounter (Signed)
Patient notified and she will come for labs. 

## 2015-04-13 ENCOUNTER — Ambulatory Visit (INDEPENDENT_AMBULATORY_CARE_PROVIDER_SITE_OTHER): Payer: Medicare HMO | Admitting: Cardiovascular Disease

## 2015-04-13 ENCOUNTER — Encounter: Payer: Self-pay | Admitting: Cardiovascular Disease

## 2015-04-13 VITALS — BP 129/74 | HR 76 | Resp 16 | Ht 64.0 in | Wt 123.0 lb

## 2015-04-13 DIAGNOSIS — I255 Ischemic cardiomyopathy: Secondary | ICD-10-CM | POA: Diagnosis not present

## 2015-04-13 DIAGNOSIS — I10 Essential (primary) hypertension: Secondary | ICD-10-CM | POA: Diagnosis not present

## 2015-04-13 DIAGNOSIS — I5043 Acute on chronic combined systolic (congestive) and diastolic (congestive) heart failure: Secondary | ICD-10-CM

## 2015-04-13 DIAGNOSIS — Z9189 Other specified personal risk factors, not elsewhere classified: Secondary | ICD-10-CM

## 2015-04-13 DIAGNOSIS — I25118 Atherosclerotic heart disease of native coronary artery with other forms of angina pectoris: Secondary | ICD-10-CM | POA: Diagnosis not present

## 2015-04-13 NOTE — Patient Instructions (Signed)
IF YOUR WEIGHT GOES ABOVE 119 LBS PLEASE CALL THE OFFICE ------ [phone].

## 2015-04-14 ENCOUNTER — Other Ambulatory Visit (INDEPENDENT_AMBULATORY_CARE_PROVIDER_SITE_OTHER): Payer: Medicare HMO

## 2015-04-14 DIAGNOSIS — R7989 Other specified abnormal findings of blood chemistry: Secondary | ICD-10-CM | POA: Diagnosis not present

## 2015-04-14 DIAGNOSIS — R945 Abnormal results of liver function studies: Secondary | ICD-10-CM

## 2015-04-14 LAB — HEPATIC FUNCTION PANEL
ALT: 29 U/L (ref 0–35)
AST: 25 U/L (ref 0–37)
Albumin: 3.5 g/dL (ref 3.5–5.2)
Alkaline Phosphatase: 155 U/L — ABNORMAL HIGH (ref 39–117)
BILIRUBIN TOTAL: 0.7 mg/dL (ref 0.2–1.2)
Bilirubin, Direct: 0.2 mg/dL (ref 0.0–0.3)
TOTAL PROTEIN: 6.6 g/dL (ref 6.0–8.3)

## 2015-04-15 ENCOUNTER — Encounter: Payer: Self-pay | Admitting: Cardiovascular Disease

## 2015-04-15 ENCOUNTER — Other Ambulatory Visit: Payer: Self-pay | Admitting: *Deleted

## 2015-04-15 DIAGNOSIS — R7989 Other specified abnormal findings of blood chemistry: Secondary | ICD-10-CM

## 2015-04-15 DIAGNOSIS — R945 Abnormal results of liver function studies: Secondary | ICD-10-CM

## 2015-04-15 NOTE — Progress Notes (Signed)
Patient ID: Sheri Dunn, female   DOB: 11-08-1950, 64 y.o.   MRN: [id number]      Cardiology Office Note   Date:  04/15/2015   ID:  Sheri Dunn, DOB March 07, 1951, MRN [id number]  PCP:  No PCP Per Patient  Cardiologist:   Thurmon Fair, MD   Chief Complaint  Patient presents with  . 2 WEEKS  . Shortness of Breath    A LITTLE EVERY NOW AND THEN      History of Present Illness: Sheri Dunn is a 64 y.o. female who presents for follow-up for acute exacerbation of chronic combined systolic and diastolic heart failure, likely triggered by titration of beta blocker therapy in the setting of severe ischemic cardiomyopathy.  She has improved subjectively after we increased diuretics, despite the fact that her weight has changed very little. It seems that she has adjusted to the higher dose of carvedilol. She still has signs and symptoms of hypervolemia, but no longer has orthopnea. Her edema is improved, but not resolved. Our office scale shows 123 pounds (her home scale shows 118 pounds) at her last appointment October 28 she weighed 1.8 pounds more than she does today. She did not tolerate higher doses of valsartan due to severe dizziness. I think we have reached the limit of medical therapy for her cardiomyopathy. Unfortunately there has been no improvement in left ventricular systolic function, which remains severely depressed.  She initially presented with heart disease in April 2016 when she had a non-ST segment elevation myocardial infarction. She received a drug-eluting stent to the mid circumflex coronary artery and another drug-eluting stent to distal right coronary artery. Ejection fraction at that time was 20% by angiography, 25-30% by echocardiographyand failed to improve on medical therapy (still 20-25% on echo July 2016). She has a narrow QRS complex on ECG.  Past Medical History  Diagnosis Date  . Hypertension   . HLD (hyperlipidemia)   . CAD (coronary artery disease)    a. NSTEMI 4/16:  LHC - mCFX 90, dRCA 90, EF 20% >> PCI: Synergy DES to mCFX and Synergy DES to dRCA  . Ischemic cardiomyopathy     a. Echo 4/16:  EF 25-30%, diff HK, ant-septal HK, Gr 1 DD, mod AI, mod LAE, mild RVE;  b.  Echo 7/16:  EF 20-25%, severe HK, Gr 1 DD, mod AI, mod MR, mod LAE, mod RVE, mod reduced RVSF, mild RAE  . Chronic systolic CHF (congestive heart failure) (HCC)   . Prolonged Q-T interval on ECG   . Elevated ALT measurement     Past Surgical History  Procedure Laterality Date  . Left heart catheterization with coronary angiogram N/A 09/08/2014    Procedure: LEFT HEART CATHETERIZATION WITH CORONARY ANGIOGRAM;  Surgeon: Corky Crafts, MD;  Location: [facility] CATH LAB;  Service: Cardiovascular;  Laterality: N/A;  . Cardiac catheterization Right 09/08/2014    Procedure: CORONARY STENT INTERVENTION;  Surgeon: Corky Crafts, MD;  [facility];  Service: Cardiovascular;  Laterality: Right;     Current Outpatient Prescriptions  Medication Sig Dispense Refill  . aspirin 81 MG chewable tablet Chew 1 tablet (81 mg total) by mouth daily. 30 tablet 3  . atorvastatin (LIPITOR) 20 MG tablet Take 1 tablet (20 mg total) by mouth daily. 30 tablet 11  . carvedilol (COREG) 12.5 MG tablet Take 1 tablet (12.5 mg total) by mouth 2 (two) times daily with a meal. 180 tablet 3  . Cholecalciferol (VITAMIN D3) 2000 UNITS TABS Take 2,000 Units  by mouth daily.    . furosemide (LASIX) 40 MG tablet Take 2 tablets (80 mg total) by mouth daily. WHEN YOUR WEIGHT REACHES 117 LBS REDUCE BACK TO 40MG  DAILY 90 tablet 3  . potassium chloride (K-DUR) 10 MEQ tablet Take 1 tablet (10 mEq total) by mouth daily. 90 tablet 3  . prasugrel (EFFIENT) 10 MG TABS tablet Take 1 tablet (10 mg total) by mouth daily. 30 tablet 6  . valsartan (DIOVAN) 160 MG tablet Take 160 mg by mouth daily.  0   No current facility-administered medications for this visit.    Allergies:   Review of patient's allergies indicates  no known allergies.    Social History:  The patient  reports that she has never smoked. She has never used smokeless tobacco. She reports that she does not drink alcohol or use illicit drugs.   Family History:  The patient's family history includes Cystic fibrosis in her sister; Diabetes in her mother; Heart attack in her mother; Stroke in her mother.    ROS:  Please see the history of present illness.    Otherwise, review of systems positive for none.   All other systems are reviewed and negative.    PHYSICAL EXAM: VS:  BP 129/74 mmHg  Pulse 76  Ht 5\' 4"  (1.626 m)  Wt 123 lb (55.792 kg)  BMI 21.10 kg/m2 , BMI Body mass index is 21.1 kg/(m^2).  General: Alert, oriented x3, no distress Head: no evidence of trauma, PERRL, EOMI, no exophtalmos or lid lag, no myxedema, no xanthelasma; normal ears, nose and oropharynx Neck: 6-8 cm jugular venous pulsations and prompt hepatojugular reflux; brisk carotid pulses without delay and no carotid bruits Chest: clear to auscultation, no signs of consolidation by percussion or palpation, normal fremitus, symmetrical and full respiratory excursions Cardiovascular: normal position and quality of the apical impulse, regular rhythm, normal first and second heart sounds, no murmurs, rubs or gallops Abdomen: no tenderness or distention, no masses by palpation, no abnormal pulsatility or arterial bruits, normal bowel sounds, no hepatosplenomegaly Extremities: no clubbing, cyanosis; medical 2+ ankle edema; 2+ radial, ulnar and brachial pulses bilaterally; 2+ right femoral, posterior tibial and dorsalis pedis pulses; 2+ left femoral, posterior tibial and dorsalis pedis pulses; no subclavian or femoral bruits Neurological: grossly nonfocal Psych: euthymic mood, full affect   EKG:  EKG is not ordered today.  Recent Labs: 09/05/2014: B Natriuretic Peptide 4342.6*; TSH 1.940 09/08/2014: Magnesium 2.1 10/07/2014: Hemoglobin 14.8; Platelets 143* 03/30/2015: BUN 25;  Creat 1.00*; Potassium 4.8; Sodium 140 04/14/2015: ALT 29    Lipid Panel    Component Value Date/Time   CHOL 94 12/11/2014 1000   TRIG 64.0 12/11/2014 1000   HDL 30.80* 12/11/2014 1000   CHOLHDL 3 12/11/2014 1000   VLDL 12.8 12/11/2014 1000   LDLCALC 50 12/11/2014 1000      Wt Readings from Last 3 Encounters:  04/13/15 123 lb (55.792 kg)  04/01/15 124 lb (56.246 kg)  03/01/15 124 lb (56.246 kg)      ASSESSMENT AND PLAN:  1. Acute on chronic combined systolic and diastolic heart failure. She still has some signs of hypervolemia and is still of of the previous estimation of 116 pounds, which seems to be her "dry weight". She seems to still be improving from last month . She would like to gain some weight and we again discussed the importance of daily weight monitoring and understanding the difference in fluid gain and true weight gain. I agree that she is  undernourished with a BMI of only 19. We reviewed sodium restriction.  2. Severe ischemic cardiomyopathy with left ventricular ejection fraction 20-25%, not improved despite two-vessel revascularization and medical therapy for congestive heart failure. Echo shows evidence of severe to dysfunction of the left ventricle and moderate dysfunction of the right ventricle. She is at increased risk for sudden cardiac death. She meets MADIT-2 criteria for Primary prevention ICD implantation (Prior myocardial infarction, left ventricular ejection fraction under 35%, heart failure NYHA class II-III, on comprehensive medical therapy). We have discussed defibrillator therapy on several occasions and she would like to go ahead with it this month.  3. CAD s/p DES to mid LCX and distal RCA April 2016.She is to remain on dual antiplatelet therapy until next April at least. Will not interrupt her Effient for ICD  4. Hyperlipidemia Excellent LDL cholesterol level  5. Moderate aortic insufficiency by echocardiography will require periodic  monitoring  6. Essential hypertension , controlled.    Current medicines are reviewed at length with the patient today.  The patient does not have concerns regarding medicines.  The following changes have been made:  no change  Labs/ tests ordered today include:  No orders of the defined types were placed in this encounter.    Patient Instructions  IF YOUR WEIGHT GOES ABOVE 119 LBS PLEASE CALL THE OFFICE ------ [phone].      Joie BimlerSigned, Draiden Mirsky, MD  04/15/2015 7:07 PM    Thurmon FairMihai Pier Laux, MD, [facility] [phone] office [phone] pager

## 2015-04-19 ENCOUNTER — Ambulatory Visit
Admission: RE | Admit: 2015-04-19 | Discharge: 2015-04-19 | Disposition: A | Discharge status: Home / Self Care | Payer: Medicare HMO | Source: Ambulatory Visit

## 2015-04-19 DIAGNOSIS — Z1231 Encounter for screening mammogram for malignant neoplasm of breast: Secondary | ICD-10-CM

## 2015-04-22 LAB — COMPREHENSIVE METABOLIC PANEL
ALK PHOS: 159 U/L — AB (ref 33–130)
ALT: 26 U/L (ref 6–29)
AST: 28 U/L (ref 10–35)
Albumin: 3.5 g/dL — ABNORMAL LOW (ref 3.6–5.1)
BILIRUBIN TOTAL: 0.9 mg/dL (ref 0.2–1.2)
BUN: 17 mg/dL (ref 7–25)
CALCIUM: 9.2 mg/dL (ref 8.6–10.4)
CO2: 31 mmol/L (ref 20–31)
CREATININE: 0.9 mg/dL (ref 0.50–0.99)
Chloride: 102 mmol/L (ref 98–110)
GLUCOSE: 119 mg/dL — AB (ref 65–99)
Potassium: 4.2 mmol/L (ref 3.5–5.3)
SODIUM: 139 mmol/L (ref 135–146)
Total Protein: 6.5 g/dL (ref 6.1–8.1)

## 2015-04-22 LAB — APTT: aPTT: 32 seconds (ref 24–37)

## 2015-04-22 LAB — CBC
HCT: 44 % (ref 36.0–46.0)
Hemoglobin: 14.1 g/dL (ref 12.0–15.0)
MCH: 27.6 pg (ref 26.0–34.0)
MCHC: 32 g/dL (ref 30.0–36.0)
MCV: 86.1 fL (ref 78.0–100.0)
PLATELETS: 165 10*3/uL (ref 150–400)
RBC: 5.11 MIL/uL (ref 3.87–5.11)
RDW: 15.5 % (ref 11.5–15.5)
WBC: 5.6 10*3/uL (ref 4.0–10.5)

## 2015-04-22 LAB — PROTIME-INR
INR: 1.31 (ref <1.50)
Prothrombin Time: 16.5 seconds — ABNORMAL HIGH (ref 11.6–15.2)

## 2015-04-23 ENCOUNTER — Telehealth: Payer: Self-pay | Admitting: Cardiovascular Disease

## 2015-04-23 NOTE — Telephone Encounter (Signed)
No interruption to Effient per Dr. Erin Hearingroitoru's instructions (see OV notes). Pt given advice affirming this, she voiced understanding.

## 2015-04-23 NOTE — Telephone Encounter (Signed)
Pt requesting instruction on Effient hold in advance of defibrillator placement.

## 2015-04-23 NOTE — Telephone Encounter (Signed)
Pt is going to have a defibrillator put in Tuesday. She is on Effient,,should she stop it?

## 2015-04-27 ENCOUNTER — Ambulatory Visit (HOSPITAL_COMMUNITY)
Admission: RE | Admit: 2015-04-27 | Discharge: 2015-04-28 | Disposition: A | Discharge status: Home / Self Care | Payer: Medicare HMO | Source: Ambulatory Visit | Attending: Cardiovascular Disease | Admitting: Cardiovascular Disease

## 2015-04-27 ENCOUNTER — Encounter (HOSPITAL_COMMUNITY)
Admission: RE | Disposition: A | Discharge status: Home / Self Care | Payer: Self-pay | Source: Ambulatory Visit | Attending: Cardiovascular Disease

## 2015-04-27 DIAGNOSIS — Z9189 Other specified personal risk factors, not elsewhere classified: Secondary | ICD-10-CM

## 2015-04-27 DIAGNOSIS — J90 Pleural effusion, not elsewhere classified: Secondary | ICD-10-CM | POA: Insufficient documentation

## 2015-04-27 DIAGNOSIS — I5043 Acute on chronic combined systolic (congestive) and diastolic (congestive) heart failure: Secondary | ICD-10-CM | POA: Diagnosis not present

## 2015-04-27 DIAGNOSIS — I5042 Chronic combined systolic (congestive) and diastolic (congestive) heart failure: Secondary | ICD-10-CM

## 2015-04-27 DIAGNOSIS — I255 Ischemic cardiomyopathy: Secondary | ICD-10-CM | POA: Insufficient documentation

## 2015-04-27 DIAGNOSIS — Z7982 Long term (current) use of aspirin: Secondary | ICD-10-CM | POA: Diagnosis not present

## 2015-04-27 DIAGNOSIS — I2510 Atherosclerotic heart disease of native coronary artery without angina pectoris: Secondary | ICD-10-CM | POA: Diagnosis not present

## 2015-04-27 DIAGNOSIS — I252 Old myocardial infarction: Secondary | ICD-10-CM | POA: Diagnosis not present

## 2015-04-27 DIAGNOSIS — R079 Chest pain, unspecified: Secondary | ICD-10-CM | POA: Diagnosis present

## 2015-04-27 DIAGNOSIS — Z95810 Presence of automatic (implantable) cardiac defibrillator: Secondary | ICD-10-CM | POA: Diagnosis present

## 2015-04-27 DIAGNOSIS — Z955 Presence of coronary angioplasty implant and graft: Secondary | ICD-10-CM | POA: Insufficient documentation

## 2015-04-27 DIAGNOSIS — I351 Nonrheumatic aortic (valve) insufficiency: Secondary | ICD-10-CM | POA: Insufficient documentation

## 2015-04-27 DIAGNOSIS — I110 Hypertensive heart disease with heart failure: Secondary | ICD-10-CM | POA: Diagnosis not present

## 2015-04-27 DIAGNOSIS — Z9861 Coronary angioplasty status: Secondary | ICD-10-CM

## 2015-04-27 DIAGNOSIS — E785 Hyperlipidemia, unspecified: Secondary | ICD-10-CM | POA: Diagnosis not present

## 2015-04-27 HISTORY — PX: EP IMPLANTABLE DEVICE: SHX172B

## 2015-04-27 LAB — SURGICAL PCR SCREEN
MRSA, PCR: NEGATIVE
STAPHYLOCOCCUS AUREUS: NEGATIVE

## 2015-04-27 SURGERY — ICD IMPLANT
Anesthesia: LOCAL

## 2015-04-27 MED ORDER — HEPARIN (PORCINE) IN NACL 2-0.9 UNIT/ML-% IJ SOLN
INTRAMUSCULAR | Status: AC
  Filled 2015-04-27: qty 1000

## 2015-04-27 MED ORDER — PRASUGREL HCL 10 MG PO TABS
10.0000 mg | ORAL_TABLET | Freq: Every day | ORAL | Status: DC
  Administered 2015-04-28: 09:00:00 10 mg via ORAL
  Filled 2015-04-27: qty 1

## 2015-04-27 MED ORDER — CEFAZOLIN SODIUM-DEXTROSE 2-3 GM-% IV SOLR
INTRAVENOUS | Status: AC
  Filled 2015-04-27: qty 50

## 2015-04-27 MED ORDER — MIDAZOLAM HCL 2 MG/2ML IJ SOLN
INTRAMUSCULAR | Status: AC
  Filled 2015-04-27: qty 2

## 2015-04-27 MED ORDER — CEFAZOLIN SODIUM-DEXTROSE 2-3 GM-% IV SOLR
2.0000 g | Freq: Four times a day (QID) | INTRAVENOUS | Status: AC
  Administered 2015-04-27 – 2015-04-28 (×3): 2 g via INTRAVENOUS
  Filled 2015-04-27 (×3): qty 50

## 2015-04-27 MED ORDER — IRBESARTAN 75 MG PO TABS
75.0000 mg | ORAL_TABLET | Freq: Every day | ORAL | Status: DC
  Administered 2015-04-28: 09:00:00 75 mg via ORAL
  Filled 2015-04-27: qty 1

## 2015-04-27 MED ORDER — MIDAZOLAM HCL 5 MG/5ML IJ SOLN: INTRAMUSCULAR | Status: DC | PRN

## 2015-04-27 MED ORDER — LIDOCAINE HCL (PF) 1 % IJ SOLN
INTRAMUSCULAR | Status: DC | PRN
  Administered 2015-04-27: 15:00:00

## 2015-04-27 MED ORDER — MIDAZOLAM HCL 2 MG/2ML IJ SOLN
INTRAMUSCULAR | Status: DC | PRN
  Administered 2015-04-27 (×3): 1 mg via INTRAVENOUS

## 2015-04-27 MED ORDER — MUPIROCIN 2 % EX OINT
TOPICAL_OINTMENT | CUTANEOUS | Status: AC
  Filled 2015-04-27: qty 22

## 2015-04-27 MED ORDER — LIDOCAINE HCL (PF) 1 % IJ SOLN
INTRAMUSCULAR | Status: AC
  Filled 2015-04-27: qty 30

## 2015-04-27 MED ORDER — SODIUM CHLORIDE 0.9 % IV SOLN
INTRAVENOUS | Status: DC
  Administered 2015-04-27: 13:00:00 via INTRAVENOUS

## 2015-04-27 MED ORDER — MUPIROCIN 2 % EX OINT
TOPICAL_OINTMENT | Freq: Two times a day (BID) | CUTANEOUS | Status: DC
  Administered 2015-04-27: 13:00:00 via NASAL
  Filled 2015-04-27: qty 22

## 2015-04-27 MED ORDER — CARVEDILOL 12.5 MG PO TABS
12.5000 mg | ORAL_TABLET | Freq: Two times a day (BID) | ORAL | Status: DC
  Administered 2015-04-27 – 2015-04-28 (×2): 12.5 mg via ORAL
  Filled 2015-04-27 (×2): qty 1

## 2015-04-27 MED ORDER — ASPIRIN 81 MG PO CHEW
81.0000 mg | CHEWABLE_TABLET | Freq: Every day | ORAL | Status: DC
  Administered 2015-04-28: 81 mg via ORAL
  Filled 2015-04-27: qty 1

## 2015-04-27 MED ORDER — ATORVASTATIN CALCIUM 20 MG PO TABS
20.0000 mg | ORAL_TABLET | Freq: Every day | ORAL | Status: DC
  Administered 2015-04-28: 20 mg via ORAL
  Filled 2015-04-27 (×2): qty 1

## 2015-04-27 MED ORDER — FENTANYL CITRATE (PF) 100 MCG/2ML IJ SOLN
INTRAMUSCULAR | Status: DC | PRN
  Administered 2015-04-27: 25 ug via INTRAVENOUS
  Administered 2015-04-27: 50 ug via INTRAVENOUS
  Administered 2015-04-27: 25 ug via INTRAVENOUS

## 2015-04-27 MED ORDER — FENTANYL CITRATE (PF) 100 MCG/2ML IJ SOLN
INTRAMUSCULAR | Status: AC
  Filled 2015-04-27: qty 2

## 2015-04-27 MED ORDER — ACETAMINOPHEN 325 MG PO TABS
325.0000 mg | ORAL_TABLET | ORAL | Status: DC | PRN
  Administered 2015-04-27 – 2015-04-28 (×2): 650 mg via ORAL
  Filled 2015-04-27 (×2): qty 2

## 2015-04-27 MED ORDER — POTASSIUM CHLORIDE CRYS ER 10 MEQ PO TBCR
10.0000 meq | EXTENDED_RELEASE_TABLET | Freq: Every day | ORAL | Status: DC
  Administered 2015-04-27 – 2015-04-28 (×2): 10 meq via ORAL
  Filled 2015-04-27 (×3): qty 1

## 2015-04-27 MED ORDER — VITAMIN D 1000 UNITS PO TABS
2000.0000 [IU] | ORAL_TABLET | Freq: Every day | ORAL | Status: DC
  Administered 2015-04-28: 09:00:00 2000 [IU] via ORAL
  Filled 2015-04-27 (×2): qty 2

## 2015-04-27 MED ORDER — SODIUM CHLORIDE 0.9 % IR SOLN
80.0000 mg | Status: DC
  Filled 2015-04-27: qty 2

## 2015-04-27 MED ORDER — SODIUM CHLORIDE 0.9 % IR SOLN
Status: AC
  Filled 2015-04-27: qty 2

## 2015-04-27 MED ORDER — CHLORHEXIDINE GLUCONATE 4 % EX LIQD: 60.0000 mL | Freq: Once | CUTANEOUS | Status: DC

## 2015-04-27 MED ORDER — SODIUM CHLORIDE 0.9 % IJ SOLN: 3.0000 mL | INTRAMUSCULAR | Status: DC | PRN

## 2015-04-27 MED ORDER — CEFAZOLIN SODIUM-DEXTROSE 2-3 GM-% IV SOLR
INTRAVENOUS | Status: DC | PRN
  Administered 2015-04-27: 2 g via INTRAVENOUS

## 2015-04-27 MED ORDER — ONDANSETRON HCL 4 MG/2ML IJ SOLN: 4.0000 mg | Freq: Four times a day (QID) | INTRAMUSCULAR | Status: DC | PRN

## 2015-04-27 MED ORDER — CEFAZOLIN SODIUM-DEXTROSE 2-3 GM-% IV SOLR: 2.0000 g | INTRAVENOUS | Status: DC

## 2015-04-27 SURGICAL SUPPLY — 9 items
CABLE SURGICAL S-101-97-12 (CABLE) ×1 IMPLANT
ELECT PAD DSPR THERM+ ADLT (MISCELLANEOUS) ×1 IMPLANT
ICD VISIA AF VR DVAB1D4 (ICD Generator) IMPLANT
KIT ESSENTIALS PG (KITS) ×1 IMPLANT
LEAD SPRINT QUAT SEC 6935M-55 (Lead) ×1 IMPLANT
PAD DEFIB LIFELINK (PAD) ×1 IMPLANT
SHEATH CLASSIC 9F (SHEATH) ×1 IMPLANT
TRAY PACEMAKER INSERTION (PACKS) ×1 IMPLANT
VISIA AF VR DVAB1D4 (ICD Generator) ×2 IMPLANT

## 2015-04-27 NOTE — Discharge Instructions (Signed)

## 2015-04-27 NOTE — Progress Notes (Signed)
ICD Criteria  Current LVEF:30%. Within 12 months prior to implant: Yes   Heart failure history: Yes, Class III  Cardiomyopathy history: Yes, Ischemic Cardiomyopathy.  Atrial Fibrillation/Atrial Flutter: No.  Ventricular tachycardia history: No.  Cardiac arrest history: No.  History of syndromes with risk of sudden death: No.  Previous ICD: No.  Current ICD indication: Primary  PPM indication: No.   Class I or II Bradycardia indication present: No  Beta Blocker therapy for 3 or more months: Yes, prescribed.   Ace Inhibitor/ARB therapy for 3 or more months: Yes, prescribed.

## 2015-04-27 NOTE — Op Note (Addendum)
Procedure report  Procedure performed:  1. Implantation of new single chamber cardioverter defibrillator 2. Fluoroscopy 3. Moderate sedation 4. Initial lead and generator testing    Reason for procedure: Primary prevention of sudden cardiac death Ischemic cardiomyopathy, left ventricular ejection fraction less than 30%, Heart failure NYHA class 2-3, on comprehensive medical therapy for over 90 days (MADIT-II)  Procedure performed by: Thurmon FairMihai Nykeria Mealing, MD  Complications: None  Estimated blood loss: <10 mL 3 Medications administered during procedure: Ancef 2 g intravenously Lidocaine 1% 30 mL locally,  Fentanyl 100 mcg intravenously Versed 3 mg intravenously  Device details:  Generator Medtronic Visia AF,  Model DVAB1D4 serial number [id number] H Right ventricular lead Medtronic [id number] serial number [id number]  Procedure details:  After the risks and benefits of the procedure were discussed the patient provided informed consent and was brought to the cardiac cath lab in the fasting state. The patient was prepped and draped in usual sterile fashion. Local anesthesia with 1% lidocaine was administered to to the left infraclavicular area. A 5-6 cm horizontal incision was made parallel with and 2-3 cm caudal to the left clavicle. Using electrocautery and blunt dissection a prepectoral pocket was created down to the level of the pectoralis major muscle fascia. The pocket was carefully inspected for hemostasis. An antibiotic-soaked sponge was placed in the pocket.  Under fluoroscopic guidance and using the modified Seldinger technique a single venipuncture was performed to access the left subclavian vein. No difficulty was encountered accessing the vein.  A J-tip guidewire was subsequently exchanged for a 9 French safe sheath.  Under fluoroscopic guidance the ventricular lead was advanced to level of the mid to apical right ventricular septum and thet active-fixation helix was  deployed. Prominent current of injury was seen. Satisfactory pacing and sensing parameters were recorded. There was no evidence of diaphragmatic stimulation at maximum device output. The safe sheath was peeled away and the lead was secured in place with 2-0 silk.  The antibiotic-soaked sponge was removed from the pocket. The pocket was flushed with copious amounts of antibiotic solution. Reinspection showed excellent hemostasis.  The ventricular lead was connected to the generator and appropriate ventricular pacing was seen. Repeat testing of the lead parameters later showed excellent values.  The entire system was then carefully inserted in the pocket with care been taking that the leads and device assumed a comfortable position without pressure on the incision. Great care was taken that the leads be located deep to the generator. The pocket was then closed in layers using 2 layers of 2-0 Vicryl and cutaneous staples, after which a sterile dressing was applied.  Defibrillation threshold testing was then performed. After adequate sedation was achieved, ventricular fibrillation was induced with a 1J shock on T method. There was appropriate sensing by the device. There were no dropouts during "least sensitive" settings. The arrhythmia was not terminated by a 15 J shock, but was terminated by a 21J shock. High voltage impedance during the shock was 35 ohm. Retesting of the leads confirmed normal function.  At the end of the procedure the following lead parameters were encountered: Right ventricular lead sensed R waves 15 mV, impedance 399ohms, threshold 0.75 V at 0.4 ms pulse width.  High voltage impedance 40, charge time 5.7 s (60A(21J)  Thurmon FairMihai Waleska Buttery, MD, [facility] [phone] office [phone] pager

## 2015-04-27 NOTE — Progress Notes (Signed)
Placed on O2 2 L [employer]

## 2015-04-27 NOTE — H&P (View-Only) (Signed)
Patient ID: Sheri Dunn, female   DOB: 07/01/1950, 64 y.o.   MRN: [id number]      Cardiology Office Note   Date:  04/15/2015   ID:  Sheri Dunn, DOB 03/03/1951, MRN [id number]  PCP:  No PCP Per Patient  Cardiologist:   Eliyas Suddreth, MD   Chief Complaint  Patient presents with  . 2 WEEKS  . Shortness of Breath    A LITTLE EVERY NOW AND THEN      History of Present Illness: Sheri Dunn is a 64 y.o. female who presents for follow-up for acute exacerbation of chronic combined systolic and diastolic heart failure, likely triggered by titration of beta blocker therapy in the setting of severe ischemic cardiomyopathy.  She has improved subjectively after we increased diuretics, despite the fact that her weight has changed very little. It seems that she has adjusted to the higher dose of carvedilol. She still has signs and symptoms of hypervolemia, but no longer has orthopnea. Her edema is improved, but not resolved. Our office scale shows 123 pounds (her home scale shows 118 pounds) at her last appointment October 28 she weighed 1.8 pounds more than she does today. She did not tolerate higher doses of valsartan due to severe dizziness. I think we have reached the limit of medical therapy for her cardiomyopathy. Unfortunately there has been no improvement in left ventricular systolic function, which remains severely depressed.  She initially presented with heart disease in April 2016 when she had a non-ST segment elevation myocardial infarction. She received a drug-eluting stent to the mid circumflex coronary artery and another drug-eluting stent to distal right coronary artery. Ejection fraction at that time was 20% by angiography, 25-30% by echocardiographyand failed to improve on medical therapy (still 20-25% on echo July 2016). She has a narrow QRS complex on ECG.  Past Medical History  Diagnosis Date  . Hypertension   . HLD (hyperlipidemia)   . CAD (coronary artery disease)    a. NSTEMI 4/16:  LHC - mCFX 90, dRCA 90, EF 20% >> PCI: Synergy DES to mCFX and Synergy DES to dRCA  . Ischemic cardiomyopathy     a. Echo 4/16:  EF 25-30%, diff HK, ant-septal HK, Gr 1 DD, mod AI, mod LAE, mild RVE;  b.  Echo 7/16:  EF 20-25%, severe HK, Gr 1 DD, mod AI, mod MR, mod LAE, mod RVE, mod reduced RVSF, mild RAE  . Chronic systolic CHF (congestive heart failure) (HCC)   . Prolonged Q-T interval on ECG   . Elevated ALT measurement     Past Surgical History  Procedure Laterality Date  . Left heart catheterization with coronary angiogram N/A 09/08/2014    Procedure: LEFT HEART CATHETERIZATION WITH CORONARY ANGIOGRAM;  Surgeon: Jayadeep S Varanasi, MD;  Location: [facility] CATH LAB;  Service: Cardiovascular;  Laterality: N/A;  . Cardiac catheterization Right 09/08/2014    Procedure: CORONARY STENT INTERVENTION;  Surgeon: Jayadeep S Varanasi, MD;  Location: [facility] CATH LAB;  Service: Cardiovascular;  Laterality: Right;     Current Outpatient Prescriptions  Medication Sig Dispense Refill  . aspirin 81 MG chewable tablet Chew 1 tablet (81 mg total) by mouth daily. 30 tablet 3  . atorvastatin (LIPITOR) 20 MG tablet Take 1 tablet (20 mg total) by mouth daily. 30 tablet 11  . carvedilol (COREG) 12.5 MG tablet Take 1 tablet (12.5 mg total) by mouth 2 (two) times daily with a meal. 180 tablet 3  . Cholecalciferol (VITAMIN D3) 2000 UNITS TABS Take 2,000 Units   by mouth daily.    . furosemide (LASIX) 40 MG tablet Take 2 tablets (80 mg total) by mouth daily. WHEN YOUR WEIGHT REACHES 117 LBS REDUCE BACK TO 40MG  DAILY 90 tablet 3  . potassium chloride (K-DUR) 10 MEQ tablet Take 1 tablet (10 mEq total) by mouth daily. 90 tablet 3  . prasugrel (EFFIENT) 10 MG TABS tablet Take 1 tablet (10 mg total) by mouth daily. 30 tablet 6  . valsartan (DIOVAN) 160 MG tablet Take 160 mg by mouth daily.  0   No current facility-administered medications for this visit.    Allergies:   Review of patient's allergies indicates  no known allergies.    Social History:  The patient  reports that she has never smoked. She has never used smokeless tobacco. She reports that she does not drink alcohol or use illicit drugs.   Family History:  The patient's family history includes Cystic fibrosis in her sister; Diabetes in her mother; Heart attack in her mother; Stroke in her mother.    ROS:  Please see the history of present illness.    Otherwise, review of systems positive for none.   All other systems are reviewed and negative.    PHYSICAL EXAM: VS:  BP 129/74 mmHg  Pulse 76  Ht 5\' 4"  (1.626 m)  Wt 123 lb (55.792 kg)  BMI 21.10 kg/m2 , BMI Body mass index is 21.1 kg/(m^2).  General: Alert, oriented x3, no distress Head: no evidence of trauma, PERRL, EOMI, no exophtalmos or lid lag, no myxedema, no xanthelasma; normal ears, nose and oropharynx Neck: 6-8 cm jugular venous pulsations and prompt hepatojugular reflux; brisk carotid pulses without delay and no carotid bruits Chest: clear to auscultation, no signs of consolidation by percussion or palpation, normal fremitus, symmetrical and full respiratory excursions Cardiovascular: normal position and quality of the apical impulse, regular rhythm, normal first and second heart sounds, no murmurs, rubs or gallops Abdomen: no tenderness or distention, no masses by palpation, no abnormal pulsatility or arterial bruits, normal bowel sounds, no hepatosplenomegaly Extremities: no clubbing, cyanosis; medical 2+ ankle edema; 2+ radial, ulnar and brachial pulses bilaterally; 2+ right femoral, posterior tibial and dorsalis pedis pulses; 2+ left femoral, posterior tibial and dorsalis pedis pulses; no subclavian or femoral bruits Neurological: grossly nonfocal Psych: euthymic mood, full affect   EKG:  EKG is not ordered today.  Recent Labs: 09/05/2014: B Natriuretic Peptide 4342.6*; TSH 1.940 09/08/2014: Magnesium 2.1 10/07/2014: Hemoglobin 14.8; Platelets 143* 03/30/2015: BUN 25;  Creat 1.00*; Potassium 4.8; Sodium 140 04/14/2015: ALT 29    Lipid Panel    Component Value Date/Time   CHOL 94 12/11/2014 1000   TRIG 64.0 12/11/2014 1000   HDL 30.80* 12/11/2014 1000   CHOLHDL 3 12/11/2014 1000   VLDL 12.8 12/11/2014 1000   LDLCALC 50 12/11/2014 1000      Wt Readings from Last 3 Encounters:  04/13/15 123 lb (55.792 kg)  04/01/15 124 lb (56.246 kg)  03/01/15 124 lb (56.246 kg)      ASSESSMENT AND PLAN:  1. Acute on chronic combined systolic and diastolic heart failure. She still has some signs of hypervolemia and is still of of the previous estimation of 116 pounds, which seems to be her "dry weight". She seems to still be improving from last month . She would like to gain some weight and we again discussed the importance of daily weight monitoring and understanding the difference in fluid gain and true weight gain. I agree that she is  undernourished with a BMI of only 19. We reviewed sodium restriction.  2. Severe ischemic cardiomyopathy with left ventricular ejection fraction 20-25%, not improved despite two-vessel revascularization and medical therapy for congestive heart failure. Echo shows evidence of severe to dysfunction of the left ventricle and moderate dysfunction of the right ventricle. She is at increased risk for sudden cardiac death. She meets MADIT-2 criteria for Primary prevention ICD implantation (Prior myocardial infarction, left ventricular ejection fraction under 35%, heart failure NYHA class II-III, on comprehensive medical therapy). We have discussed defibrillator therapy on several occasions and she would like to go ahead with it this month.  3. CAD s/p DES to mid LCX and distal RCA April 2016.She is to remain on dual antiplatelet therapy until next April at least. Will not interrupt her Effient for ICD  4. Hyperlipidemia Excellent LDL cholesterol level  5. Moderate aortic insufficiency by echocardiography will require periodic  monitoring  6. Essential hypertension , controlled.    Current medicines are reviewed at length with the patient today.  The patient does not have concerns regarding medicines.  The following changes have been made:  no change  Labs/ tests ordered today include:  No orders of the defined types were placed in this encounter.    Patient Instructions  IF YOUR WEIGHT GOES ABOVE 119 LBS PLEASE CALL THE OFFICE ------ [phone].      Joie BimlerSigned, Laikynn Pollio, MD  04/15/2015 7:07 PM    Thurmon FairMihai Kieren Ricci, MD, [facility] [phone] office [phone] pager

## 2015-04-27 NOTE — Interval H&P Note (Signed)
History and Physical Interval Note:  04/27/2015 1:04 PM  Sheri Dunn  has presented today for surgery, with the diagnosis of ischemic cardiomyopathy  The various methods of treatment have been discussed with the patient and family. After consideration of risks, benefits and other options for treatment, the patient has consented to  Procedure(s): ICD Implant (N/A) as a surgical intervention .  The patient's history has been reviewed, patient examined, no change in status, stable for surgery.  I have reviewed the patient's chart and labs.  Questions were answered to the patient's satisfaction.     Jacaden Forbush

## 2015-04-28 ENCOUNTER — Ambulatory Visit (HOSPITAL_COMMUNITY): Payer: Medicare HMO

## 2015-04-28 ENCOUNTER — Encounter (HOSPITAL_COMMUNITY): Payer: Self-pay | Admitting: Cardiovascular Disease

## 2015-04-28 DIAGNOSIS — I5043 Acute on chronic combined systolic (congestive) and diastolic (congestive) heart failure: Secondary | ICD-10-CM | POA: Diagnosis not present

## 2015-04-28 DIAGNOSIS — I110 Hypertensive heart disease with heart failure: Secondary | ICD-10-CM | POA: Diagnosis not present

## 2015-04-28 DIAGNOSIS — I255 Ischemic cardiomyopathy: Secondary | ICD-10-CM | POA: Diagnosis not present

## 2015-04-28 DIAGNOSIS — I2510 Atherosclerotic heart disease of native coronary artery without angina pectoris: Secondary | ICD-10-CM | POA: Diagnosis not present

## 2015-04-28 MED ORDER — FUROSEMIDE 40 MG PO TABS: 80.0000 mg | ORAL_TABLET | Freq: Every day | ORAL | Status: DC

## 2015-04-28 MED ORDER — YOU HAVE A PACEMAKER BOOK
Freq: Once | Status: AC
  Administered 2015-04-28: 02:00:00
  Filled 2015-04-28: qty 1

## 2015-04-28 MED FILL — Sodium Chloride Irrigation Soln 0.9%: Qty: 500 | Status: AC

## 2015-04-28 MED FILL — Heparin Sodium (Porcine) 2 Unit/ML in Sodium Chloride 0.9%: INTRAMUSCULAR | Qty: 500 | Status: AC

## 2015-04-28 MED FILL — Gentamicin Sulfate Inj 40 MG/ML: INTRAMUSCULAR | Qty: 2 | Status: AC

## 2015-04-28 MED FILL — Lidocaine HCl Local Preservative Free (PF) Inj 1%: INTRAMUSCULAR | Qty: 30 | Status: AC

## 2015-04-28 NOTE — Discharge Summary (Signed)
Physician Discharge Summary    Cardiologist: Croitoru Patient ID: Sheri Dunn MRN: [id number] DOB/AGE: 12/25/1950 64 y.o.  Admit date: 04/27/2015 Discharge date: 04/28/2015  Admission Diagnoses:  Cardiomyopathy, ischemic;  At risk for sudden cardiac death  Discharge Diagnoses:  Principal Problem:   At risk for sudden cardiac death Active Problems:   Cardiomyopathy, ischemic   CAD (coronary artery disease)   Chest pain   Chronic combined systolic and diastolic CHF (congestive heart failure) (HCC)   ICD (implantable cardioverter-defibrillator) in place   Discharged Condition: stable  Hospital Course:  Sheri Dunn is a 64 y.o. female who presents for follow-up for acute exacerbation of chronic combined systolic and diastolic heart failure, likely triggered by titration of beta blocker therapy in the setting of severe ischemic cardiomyopathy.  She has improved subjectively after we increased diuretics, despite the fact that her weight has changed very little. It seems that she has adjusted to the higher dose of carvedilol. She still has signs and symptoms of hypervolemia, but no longer has orthopnea. Her edema is improved, but not resolved. Our office scale shows 123 pounds (her home scale shows 118 pounds) at her last appointment October 28 she weighed 1.8 pounds more than she does today. She did not tolerate higher doses of valsartan due to severe dizziness. I think we have reached the limit of medical therapy for her cardiomyopathy. Unfortunately there has been no improvement in left ventricular systolic function, which remains severely depressed.  She initially presented with heart disease in April 2016 when she had a non-ST segment elevation myocardial infarction. She received a drug-eluting stent to the mid circumflex coronary artery and another drug-eluting stent to distal right coronary artery. Ejection fraction at that time was 20% by angiography, 25-30% by  echocardiographyand failed to improve on medical therapy (still 20-25% on echo July 2016). She has a narrow QRS complex on ECG.  She is at increased risk for sudden cardiac death and meets MADIT-2 criteria for Primary prevention ICD implantation (Prior myocardial infarction, left ventricular ejection fraction under 35%, heart failure NYHA class II-III, on comprehensive medical therapy). She was scheduled for, and underwent successful ICD implantation on 04/27/2015.  Postop chest x-ray showed no pneumothorax. The patient was seen by Dr. Royann Shivers who felt she was stable for DC home.  Device details: Generator Medtronic Grant AF, Model DVAB1D4 serial number [id number] Right ventricular lead Medtronic [id number] serial number [id number]  Postop device check: R waves 15.5 mv IMP 456 TH 0.5V at 0.32ms   Consults: none  Significant Diagnostic Studies:  Procedure report  Procedure performed:  1. Implantation of new single chamber cardioverter defibrillator 2. Fluoroscopy 3. Moderate sedation 4. Initial lead and generator testing    Reason for procedure: Primary prevention of sudden cardiac death Ischemic cardiomyopathy, left ventricular ejection fraction less than 30%, Heart failure NYHA class 2-3, on comprehensive medical therapy for over 90 days (MADIT-II)  Procedure performed by: Thurmon Fair, MD  Complications: None  Estimated blood loss: <10 mL 3 Medications administered during procedure: Ancef 2 g intravenously Lidocaine 1% 30 mL locally,  Fentanyl 100 mcg intravenously Versed 3 mg intravenously  Device details:  Generator Medtronic Visia AF, Model DVAB1D4 serial number [id number] Right ventricular lead Medtronic [id number] serial number [id number]  Procedure details:  After the risks and benefits of the procedure were discussed the patient provided informed consent and was brought to the cardiac cath lab in the fasting state. The patient was prepped and draped in  usual sterile fashion. Local anesthesia  with 1% lidocaine was administered to to the left infraclavicular area. A 5-6 cm horizontal incision was made parallel with and 2-3 cm caudal to the left clavicle. Using electrocautery and blunt dissection a prepectoral pocket was created down to the level of the pectoralis major muscle fascia. The pocket was carefully inspected for hemostasis. An antibiotic-soaked sponge was placed in the pocket.  Under fluoroscopic guidance and using the modified Seldinger technique a single venipuncture was performed to access the left subclavian vein. No difficulty was encountered accessing the vein. A J-tip guidewire was subsequently exchanged for a 9 French safe sheath.  Under fluoroscopic guidance the ventricular lead was advanced to level of the mid to apical right ventricular septum and thet active-fixation helix was deployed. Prominent current of injury was seen. Satisfactory pacing and sensing parameters were recorded. There was no evidence of diaphragmatic stimulation at maximum device output. The safe sheath was peeled away and the lead was secured in place with 2-0 silk.  The antibiotic-soaked sponge was removed from the pocket. The pocket was flushed with copious amounts of antibiotic solution. Reinspection showed excellent hemostasis.  The ventricular lead was connected to the generator and appropriate ventricular pacing was seen. Repeat testing of the lead parameters later showed excellent values.  The entire system was then carefully inserted in the pocket with care been taking that the leads and device assumed a comfortable position without pressure on the incision. Great care was taken that the leads be located deep to the generator. The pocket was then closed in layers using 2 layers of 2-0 Vicryl and cutaneous staples, after which a sterile dressing was applied.  Defibrillation threshold testing was then performed. After adequate sedation was achieved,  ventricular fibrillation was induced with a 1J shock on T method. There was appropriate sensing by the device. There were no dropouts during "least sensitive" settings. The arrhythmia was not terminated by a 15 J shock, but was terminated by a 21J shock. High voltage impedance during the shock was 35 ohm. Retesting of the leads confirmed normal function.  At the end of the procedure the following lead parameters were encountered: Right ventricular lead sensed R waves 15 mV, impedance 399ohms, threshold 0.75 V at 0.4 ms pulse width.  High voltage impedance 40, charge time 5.7 s (21J)  Thurmon FairMihai Croitoru, MD, [facility] Treatments:  See above  Discharge Exam: Blood pressure 133/67, pulse 75, temperature 97.4 F (36.3 C), temperature source Oral, resp. rate 20, height 5\' 6"  (1.676 m), weight 117 lb 15.1 oz (53.5 kg), SpO2 98 %.   Disposition: 01-Home or Self Care  Discharge Instructions    Call MD for:  redness, tenderness, or signs of infection (pain, swelling, redness, odor or green/yellow discharge around incision site)    Complete by:  As directed      Diet - low sodium heart healthy    Complete by:  As directed      Increase activity slowly    Complete by:  As directed             Medication List    TAKE these medications        aspirin 81 MG chewable tablet  Chew 1 tablet (81 mg total) by mouth daily.     atorvastatin 20 MG tablet  Commonly known as:  LIPITOR  Take 1 tablet (20 mg total) by mouth daily.     carvedilol 12.5 MG tablet  Commonly known as:  COREG  Take 1 tablet (12.5 mg total)  by mouth 2 (two) times daily with a meal.     furosemide 40 MG tablet  Commonly known as:  LASIX  Take 2 tablets (80 mg total) by mouth daily. WHEN YOUR WEIGHT REACHES 117 LBS REDUCE BACK TO  DAILY.take 1 every day     potassium chloride 10 MEQ tablet  Commonly known as:  K-DUR  Take 1 tablet (10 mEq total) by mouth daily.     prasugrel 10 MG Tabs tablet  Commonly known as:  EFFIENT   Take 1 tablet (10 mg total) by mouth daily.     valsartan 160 MG tablet  Commonly known as:  DIOVAN  Take 160 mg by mouth daily.     Vitamin D3 2000 UNITS Tabs  Take 2,000 Units by mouth daily.           Follow-up Information    Follow up with Elza Varricchio, PA-C On 05/05/2015.   Specialties:  Physician Assistant, Radiology, Interventional Cardiology   Why:  11:30 AM   Contact information:   [address] [place] [address] [phone]      Greater than 30 minutes was spent completing the patient's discharge.    SignedWilburt Finlay, PAC 04/28/2015, 11:13 AM

## 2015-04-28 NOTE — Progress Notes (Signed)
Patient Name: Sheri CurlsCarolyn Dunn Date of Encounter: 04/28/2015  Principal Problem:   At risk for sudden cardiac death Active Problems:   Cardiomyopathy, ischemic   CAD (coronary artery disease)   Chest pain   Chronic combined systolic and diastolic CHF (congestive heart failure) (HCC)   ICD (implantable cardioverter-defibrillator) in place   Length of Stay:   SUBJECTIVE  Slightly sore at surgical site. A little oozing overnight, no hematoma. CXR OK device check with satisfactory and stable parameters.  CURRENT MEDS . aspirin  81 mg Oral Daily  . atorvastatin  20 mg Oral Daily  . carvedilol  12.5 mg Oral BID WC  .  ceFAZolin (ANCEF) IV  2 g Intravenous Q6H  . cholecalciferol  2,000 Units Oral Daily  . irbesartan  75 mg Oral Daily  . potassium chloride  10 mEq Oral Daily  . prasugrel  10 mg Oral Daily    OBJECTIVE   Intake/Output Summary (Last 24 hours) at 04/28/15 0915 Last data filed at 04/28/15 0752  Gross per 24 hour  Intake    480 ml  Output    300 ml  Net    180 ml   Filed Weights   04/27/15 1157 04/28/15 0335  Weight: 117 lb (53.071 kg) 117 lb 15.1 oz (53.5 kg)    PHYSICAL EXAM Filed Vitals:   04/27/15 1900 04/27/15 2025 04/28/15 0335 04/28/15 0752  BP: 130/59 126/51 128/74 133/67  Pulse: 73 68 68 75  Temp:  98.2 F (36.8 C) 98 F (36.7 C) 97.4 F (36.3 C)  TempSrc:  Oral Oral Oral  Resp:  15 18 20   Height:      Weight:   117 lb 15.1 oz (53.5 kg)   SpO2: 97% 97% 99% 98%   General: Alert, oriented x3, no distress Head: no evidence of trauma, PERRL, EOMI, no exophtalmos or lid lag, no myxedema, no xanthelasma; normal ears, nose and oropharynx Neck: normal jugular venous pulsations and no hepatojugular reflux; brisk carotid pulses without delay and no carotid bruits Chest: clear to auscultation, no signs of consolidation by percussion or palpation, normal fremitus, symmetrical and full respiratory excursions; no hematoma, slight oozing on  dressing. Cardiovascular: normal position and quality of the apical impulse, regular rhythm, normal first and second heart sounds, no rubs or gallops, no murmur Abdomen: no tenderness or distention, no masses by palpation, no abnormal pulsatility or arterial bruits, normal bowel sounds, no hepatosplenomegaly Extremities: no clubbing, cyanosis or edema; 2+ radial, ulnar and brachial pulses bilaterally; 2+ right femoral, posterior tibial and dorsalis pedis pulses; 2+ left femoral, posterior tibial and dorsalis pedis pulses; no subclavian or femoral bruits Neurological: grossly nonfocal  LABS  Radiology Studies Imaging results have been reviewed and Dg Chest 2 View  04/28/2015  CLINICAL DATA:  AICD now present, followup EXAM: CHEST  2 VIEW COMPARISON:  Chest x-ray of 10/07/2014 FINDINGS: There are now small pleural effusions present with mild bibasilar atelectasis. Cardiomegaly is stable. AICD lead is now present with tip near the expected apex of the right ventricle. No pneumothorax is seen. IMPRESSION: 1. AICD lead now present. 2. Cardiomegaly.  There are now small pleural effusions present. Electronically Signed   By: Dwyane DeePaul  Barry M.D.   On: 04/28/2015 08:03    TELE NSR, PACs  ECG NSR with PAC  ASSESSMENT AND PLAN  Uncomplicated single chamber ICD. Shock plan reviewed. Wound care instructions given. Activity restrictions discussed. Wound check 7- 10 days, office device check 4-8 weeks.  [name], MD,  [facility] [phone] office [phone] pager 04/28/2015 9:15 AM

## 2015-04-29 ENCOUNTER — Telehealth: Payer: Self-pay | Admitting: Physician Assistant

## 2015-04-29 NOTE — Telephone Encounter (Signed)
Patient had single lead ICD placed on 05/27/2015, she wanted to know if she can do a little light walking, I told her it is fine. She is aware of discharge instruction regarding arm elevation in the first 2 weeks after device placement.   Ramond DialSigned, Keira Bohlin PA Pager: [phone]

## 2015-05-05 ENCOUNTER — Encounter: Payer: Self-pay | Admitting: Physician Assistant

## 2015-05-05 ENCOUNTER — Ambulatory Visit (INDEPENDENT_AMBULATORY_CARE_PROVIDER_SITE_OTHER): Payer: Self-pay | Admitting: Physician Assistant

## 2015-05-05 ENCOUNTER — Telehealth: Payer: Self-pay | Admitting: Cardiovascular Disease

## 2015-05-05 VITALS — Wt 126.0 lb

## 2015-05-05 DIAGNOSIS — Z95810 Presence of automatic (implantable) cardiac defibrillator: Secondary | ICD-10-CM

## 2015-05-05 DIAGNOSIS — I5043 Acute on chronic combined systolic (congestive) and diastolic (congestive) heart failure: Secondary | ICD-10-CM

## 2015-05-05 NOTE — Progress Notes (Signed)
Patient ID: Sheri Dunn, female   DOB: 1950/07/04, 64 y.o.   MRN: [id number]    Date:  05/05/2015   ID:  Sheri Dunn, DOB Feb 02, 1951, MRN [id number]  PCP:  No PCP Per Patient  Primary Cardiologist:  croitoru  Chief complaint: Post hospital follow-up   History of Present Illness: Sheri Dunn is a 64 y.o. female who presented recently for follow-up for acute exacerbation of chronic combined systolic and diastolic heart failure, likely triggered by titration of beta blocker therapy in the setting of severe ischemic cardiomyopathy.  She has improved subjectively after increased diuretics, despite the fact that her weight has changed very little. It seems that she has adjusted to the higher dose of carvedilol.  She did not tolerate higher doses of valsartan due to severe dizziness.  Dr Sallyanne Kuster thought we reached the limit of medical therapy for her cardiomyopathy. Unfortunately there has been no improvement in left ventricular systolic function, which remains severely depressed.  She initially presented with heart disease in April 2016 when she had a non-ST segment elevation myocardial infarction. She received a drug-eluting stent to the mid circumflex coronary artery and another drug-eluting stent to distal right coronary artery. Ejection fraction at that time was 20% by angiography, 25-30% by echocardiographyand failed to improve on medical therapy (still 20-25% on echo July 2016). She has a narrow QRS complex on ECG.  She was at increased risk for sudden cardiac death and met MADIT-2 criteria for Primary prevention ICD implantation (Prior myocardial infarction, left ventricular ejection fraction under 35%, heart failure NYHA class II-III, on comprehensive medical therapy). She was scheduled for, and underwent successful ICD implantation on 04/27/2015.   Patient presented today for a wound check. She was very nervous about coming because she figured it would be very painful to remove the  staples. She reports her weight has gone up on her scale as it has on ours.  She also reports lower extremity edema.  The patient currently denies nausea, vomiting, fever, chest pain, shortness of breath, orthopnea, dizziness, PND, cough, congestion, abdominal pain, hematochezia, melena, claudication.  Wt Readings from Last 3 Encounters:  05/05/15 126 lb (57.153 kg)  04/28/15 117 lb 15.1 oz (53.5 kg)  04/13/15 123 lb (55.792 kg)     Past Medical History  Diagnosis Date  . Hypertension   . HLD (hyperlipidemia)   . CAD (coronary artery disease)     a. NSTEMI 4/16:  LHC - mCFX 90, dRCA 90, EF 20% >> PCI: Synergy DES to mCFX and Synergy DES to dRCA  . Ischemic cardiomyopathy     a. Echo 4/16:  EF 25-30%, diff HK, ant-septal HK, Gr 1 DD, mod AI, mod LAE, mild RVE;  b.  Echo 7/16:  EF 20-25%, severe HK, Gr 1 DD, mod AI, mod MR, mod LAE, mod RVE, mod reduced RVSF, mild RAE  . Chronic systolic CHF (congestive heart failure) ([place])   . Prolonged Q-T interval on ECG   . Elevated ALT measurement     Current Outpatient Prescriptions  Medication Sig Dispense Refill  . aspirin 81 MG chewable tablet Chew 1 tablet (81 mg total) by mouth daily. 30 tablet 3  . atorvastatin (LIPITOR) 20 MG tablet Take 1 tablet (20 mg total) by mouth daily. 30 tablet 11  . carvedilol (COREG) 12.5 MG tablet Take 1 tablet (12.5 mg total) by mouth 2 (two) times daily with a meal. 180 tablet 3  . Cholecalciferol (VITAMIN D3) 2000 UNITS TABS Take 2,000 Units by mouth  daily.    . furosemide (LASIX) 40 MG tablet Take 2 tablets (80 mg total) by mouth daily. WHEN YOUR WEIGHT REACHES 117 LBS REDUCE BACK TO 40MG DAILY.take 1 every day 90 tablet 3  . potassium chloride (K-DUR) 10 MEQ tablet Take 1 tablet (10 mEq total) by mouth daily. 90 tablet 3  . prasugrel (EFFIENT) 10 MG TABS tablet Take 1 tablet (10 mg total) by mouth daily. 30 tablet 6  . valsartan (DIOVAN) 160 MG tablet Take 160 mg by mouth daily.  0   No current  facility-administered medications for this visit.    Allergies:   No Known Allergies  Social History:  The patient  reports that she has never smoked. She has never used smokeless tobacco. She reports that she does not drink alcohol or use illicit drugs.   Family history:   Family History  Problem Relation Age of Onset  . Heart attack Mother   . Stroke Mother   . Cystic fibrosis Sister   . Diabetes Mother     ROS:  Please see the history of present illness.  All other systems reviewed and negative.   PHYSICAL EXAM: VS:  Wt 126 lb (57.153 kg) Well nourished, well developed, in no acute distress HEENT: Pupils are equal round react to light accommodation extraocular movements are intact.  Neck: Elevated JVD. Cardiac: Regular rate and rhythm without murmurs rubs or gallops. Lungs:  clear to auscultation bilaterally, no wheezing, rhonchi or rales Ext: 2+ left lower extremity edema 1+ right lower extremity edema.  2+ radial and dorsalis pedis pulses. Skin: warm and dry.  ICD site is healing well. Staples were removed. There is no erythema edema or discharge. Neuro:  Grossly normal    ASSESSMENT AND PLAN:  Problem List Items Addressed This Visit    ICD (implantable cardioverter-defibrillator) in place - Primary   Acute on chronic combined systolic and diastolic ACC/AHA stage C congestive heart failure ([place])     Patient clearly in acute heart failure. She's not having any difficulty breathing however JVD is elevated while she sitting straight up and she has 1-2+ lower extremity edema. I've asked her to take additional Lasix tonight and wears off in the morning. If her weight continues to be elevated she should take another dose tomorrow night as well. She'll then resume her usual doses will see her back in a couple weeks.  CV site is healing well no signs of infection. Staples were removed.

## 2015-05-05 NOTE — Patient Instructions (Signed)
Take an extra Furosemide (Lasix) today. Weigh yourself in the morning. If your weight is still up, take an extra Lasix tomorrow afternoon/evening. Then resume 1 tablet by mouth daily.  Wilburt FinlayBryan Hager, PA-C, recommends that you schedule a follow-up appointment in 2 weeks with an extender (NP or PA).

## 2015-05-05 NOTE — Telephone Encounter (Signed)
Spoke w/ pt and informed her that her home monitor is working. Instructed her to press middle button two times. I told her the screen will only lite up if she presses the button twice or she is sending a manual transmission. I informed her that the date will only update if the monitor sends a scheduled remote transmission or she sends a manual transmission. Pt verbalized understanding.

## 2015-05-05 NOTE — Telephone Encounter (Signed)
Please call,question about her pacemaker transmitter.

## 2015-05-06 ENCOUNTER — Telehealth: Payer: Self-pay | Admitting: *Deleted

## 2015-05-06 NOTE — Telephone Encounter (Signed)
-----   Message from Daphine Deutscheregina N Kinberly Perris, RN sent at 03/10/2015  9:16 AM EDT ----- Call and remind due for LFT on 05/10/15 for SA. Lab in.

## 2015-05-06 NOTE — Telephone Encounter (Signed)
Patient notified of appointment and she will come for labs next week.

## 2015-05-12 ENCOUNTER — Other Ambulatory Visit (INDEPENDENT_AMBULATORY_CARE_PROVIDER_SITE_OTHER): Payer: Medicare HMO

## 2015-05-12 DIAGNOSIS — R7989 Other specified abnormal findings of blood chemistry: Secondary | ICD-10-CM | POA: Diagnosis not present

## 2015-05-12 DIAGNOSIS — R945 Abnormal results of liver function studies: Secondary | ICD-10-CM

## 2015-05-12 LAB — HEPATIC FUNCTION PANEL
ALBUMIN: 3.5 g/dL (ref 3.5–5.2)
ALK PHOS: 198 U/L — AB (ref 39–117)
ALT: 29 U/L (ref 0–35)
AST: 30 U/L (ref 0–37)
BILIRUBIN DIRECT: 0.4 mg/dL — AB (ref 0.0–0.3)
BILIRUBIN TOTAL: 1 mg/dL (ref 0.2–1.2)
Total Protein: 6.6 g/dL (ref 6.0–8.3)

## 2015-05-13 ENCOUNTER — Other Ambulatory Visit: Payer: Self-pay | Admitting: *Deleted

## 2015-05-13 DIAGNOSIS — R945 Abnormal results of liver function studies: Principal | ICD-10-CM

## 2015-05-13 DIAGNOSIS — R7989 Other specified abnormal findings of blood chemistry: Secondary | ICD-10-CM

## 2015-05-18 ENCOUNTER — Encounter: Payer: Self-pay | Admitting: Physician Assistant

## 2015-05-18 ENCOUNTER — Ambulatory Visit (INDEPENDENT_AMBULATORY_CARE_PROVIDER_SITE_OTHER): Payer: Medicare HMO | Admitting: Physician Assistant

## 2015-05-18 VITALS — BP 126/76 | HR 76 | Ht 66.0 in | Wt 130.6 lb

## 2015-05-18 DIAGNOSIS — Z79899 Other long term (current) drug therapy: Secondary | ICD-10-CM | POA: Diagnosis not present

## 2015-05-18 DIAGNOSIS — I5043 Acute on chronic combined systolic (congestive) and diastolic (congestive) heart failure: Secondary | ICD-10-CM | POA: Diagnosis not present

## 2015-05-18 NOTE — Patient Instructions (Signed)
Your physician recommends that you return for lab work in: TODAY AT [facility] 1ST FLOOR  Your physician recommends that you schedule a follow-up appointment in: ONE MONTH WITH RHONDA BARRETT, PA OR DR. Royann Shivers    Drink Ensure twice a day as a snack; drink two (2) for meals.  Rinse frozen vegetables before cooking.  Greek yogurt is a good protein.  Beans are fine.  Drink 2-3 16oz bottles of water a day.  CONTINUE A LOW SODIUM DIET  Low-Sodium Eating Plan Sodium raises blood pressure and causes water to be held in the body. Getting less sodium from food will help lower your blood pressure, reduce any swelling, and protect your heart, liver, and kidneys. We get sodium by adding salt (sodium chloride) to food. Most of our sodium comes from canned, boxed, and frozen foods. Restaurant foods, fast foods, and pizza are also very high in sodium. Even if you take medicine to lower your blood pressure or to reduce fluid in your body, getting less sodium from your food is important. WHAT IS MY PLAN? Most people should limit their sodium intake to 2,300 mg a day. Your health care provider recommends that you limit your sodium intake to ___2000 MG_______ a day.  WHAT DO I NEED TO KNOW ABOUT THIS EATING PLAN? For the low-sodium eating plan, you will follow these general guidelines:  Choose foods with a % Daily Value for sodium of less than 5% (as listed on the food label).   Use salt-free seasonings or herbs instead of table salt or sea salt.   Check with your health care provider or pharmacist before using salt substitutes.   Eat fresh foods.  Eat more vegetables and fruits.  Limit canned vegetables. If you do use them, rinse them well to decrease the sodium.   Limit cheese to 1 oz (28 g) per day.   Eat lower-sodium products, often labeled as "lower sodium" or "no salt added."  Avoid foods that contain monosodium glutamate (MSG). MSG is sometimes added to [place] food and some  canned foods.  Check food labels (Nutrition Facts labels) on foods to learn how much sodium is in one serving.  Eat more home-cooked food and less restaurant, buffet, and fast food.  When eating at a restaurant, ask that your food be prepared with less salt, or no salt if possible.  HOW DO I READ FOOD LABELS FOR SODIUM INFORMATION? The Nutrition Facts label lists the amount of sodium in one serving of the food. If you eat more than one serving, you must multiply the listed amount of sodium by the number of servings. Food labels may also identify foods as:  Sodium free--Less than 5 mg in a serving.  Very low sodium--35 mg or less in a serving.  Low sodium--140 mg or less in a serving.  Light in sodium--50% less sodium in a serving. For example, if a food that usually has 300 mg of sodium is changed to become light in sodium, it will have 150 mg of sodium.  Reduced sodium--25% less sodium in a serving. For example, if a food that usually has 400 mg of sodium is changed to reduced sodium, it will have 300 mg of sodium. WHAT FOODS CAN I EAT? Grains Low-sodium cereals, including oats, puffed wheat and rice, and shredded wheat cereals. Low-sodium crackers. Unsalted rice and pasta. Lower-sodium bread.  Vegetables Frozen or fresh vegetables. Low-sodium or reduced-sodium canned vegetables. Low-sodium or reduced-sodium tomato sauce and paste. Low-sodium or reduced-sodium tomato and  vegetable juices.  Fruits Fresh, frozen, and canned fruit. Fruit juice.  Meat and Other Protein Products Low-sodium canned tuna and salmon. Fresh or frozen meat, poultry, seafood, and fish. Lamb. Unsalted nuts. Dried beans, peas, and lentils without added salt. Unsalted canned beans. Homemade soups without salt. Eggs.  Dairy Milk. Soy milk. Ricotta cheese. Low-sodium or reduced-sodium cheeses. Yogurt.  Condiments Fresh and dried herbs and spices. Salt-free seasonings. Onion and garlic powders. Low-sodium  varieties of mustard and ketchup. Fresh or refrigerated horseradish. Lemon juice.  Fats and Oils Reduced-sodium salad dressings. Unsalted butter.  Other Unsalted popcorn and pretzels.  The items listed above may not be a complete list of recommended foods or beverages. Contact your dietitian for more options. WHAT FOODS ARE NOT RECOMMENDED? Grains Instant hot cereals. Bread stuffing, pancake, and biscuit mixes. Croutons. Seasoned rice or pasta mixes. Noodle soup cups. Boxed or frozen macaroni and cheese. Self-rising flour. Regular salted crackers. Vegetables Regular canned vegetables. Regular canned tomato sauce and paste. Regular tomato and vegetable juices. Frozen vegetables in sauces. Salted JamaicaFrench fries. Olives. Rosita FirePickles. Relishes. Sauerkraut. Salsa. Meat and Other Protein Products Salted, canned, smoked, spiced, or pickled meats, seafood, or fish. Bacon, ham, sausage, hot dogs, corned beef, chipped beef, and packaged luncheon meats. Salt pork. Jerky. Pickled herring. Anchovies, regular canned tuna, and sardines. Salted nuts. Dairy Processed cheese and cheese spreads. Cheese curds. Blue cheese and cottage cheese. Buttermilk.  Condiments Onion and garlic salt, seasoned salt, table salt, and sea salt. Canned and packaged gravies. Worcestershire sauce. Tartar sauce. Barbecue sauce. Teriyaki sauce. Soy sauce, including reduced sodium. Steak sauce. Fish sauce. Oyster sauce. Cocktail sauce. Horseradish that you find on the shelf. Regular ketchup and mustard. Meat flavorings and tenderizers. Bouillon cubes. Hot sauce. Tabasco sauce. Marinades. Taco seasonings. Relishes. Fats and Oils Regular salad dressings. Salted butter. Margarine. Ghee. Bacon fat.  Other Potato and tortilla chips. Corn chips and puffs. Salted popcorn and pretzels. Canned or dried soups. Pizza. Frozen entrees and pot pies.  The items listed above may not be a complete list of foods and beverages to avoid. Contact your  dietitian for more information.   This information is not intended to replace advice given to you by your health care provider. Make sure you discuss any questions you have with your health care provider.   Document Released: 11/11/2001 Document Revised: 06/12/2014 Document Reviewed: 03/26/2013 Elsevier Interactive Patient Education [employer].   Your physician recommends that you weigh, daily, at the same time every day, and in the same amount of clothing. Please record your daily weights on the handout provided and bring it to your next appointment.  Daily Weight Record It is important to weigh yourself daily. Keep this daily weight chart near your scale. Weigh yourself each morning at the same time. Weigh yourself without shoes, and wear the same amount of clothing each day. Compare today's weight to yesterday's weight. Bring this form with you to your follow-up appointments. Call your health care provider if you have concerns about your weight, including rapid weight gain or rapid weight loss. Date: ________ Weight: ____________________ Date: ________ Weight: ____________________ Date: ________ Weight: ____________________ Date: ________ Weight: ____________________ Date: ________ Weight: ____________________ Date: ________ Weight: ____________________ Date: ________ Weight: ____________________ Date: ________ Weight: ____________________ Date: ________ Weight: ____________________ Date: ________ Weight: ____________________ Date: ________ Weight: ____________________ Date: ________ Weight: ____________________ Date: ________ Weight: ____________________ Date: ________ Weight: ____________________ Date: ________ Weight: ____________________ Date: ________ Weight: ____________________ Date: ________ Weight: ____________________ Date: ________ Weight: ____________________ Date:  ________ Weight: ____________________ Date: ________ Weight: ____________________ Date: ________ Weight:  ____________________ Date: ________ Weight: ____________________ Date: ________ Weight: ____________________ Date: ________ Weight: ____________________ Date: ________ Weight: ____________________ Date: ________ Weight: ____________________ Date: ________ Weight: ____________________ Date: ________ Weight: ____________________ Date: ________ Weight: ____________________ Date: ________ Weight: ____________________ Date: ________ Weight: ____________________ Date: ________ Weight: ____________________ Date: ________ Weight: ____________________ Date: ________ Weight: ____________________ Date: ________ Weight: ____________________ Date: ________ Weight: ____________________ Date: ________ Weight: ____________________ Date: ________ Weight: ____________________ Date: ________ Weight: ____________________ Date: ________ Weight: ____________________ Date: ________ Weight: ____________________ Date: ________ Weight: ____________________ Date: ________ Weight: ____________________ Date: ________ Weight: ____________________ Date: ________ Weight: ____________________ Date: ________ Weight: ____________________ Date: ________ Weight: ____________________ Date: ________ Weight: ____________________ Date: ________ Weight: ____________________ Date: ________ Weight: ____________________   This information is not intended to replace advice given to you by your health care provider. Make sure you discuss any questions you have with your health care provider.   Document Released: 08/03/2006 Document Revised: 06/12/2014 Document Reviewed: 12/19/2013 Elsevier Interactive Patient Education [employer]

## 2015-05-18 NOTE — Progress Notes (Signed)
Cardiology Office Note   Date:  05/18/2015   ID:  Sheri Dunn, DOB 06-Nov-1950, MRN [id number]  PCP:  No PCP Per Patient  Cardiologist:  Dr Chrisandra Netters, PA-C   Chief Complaint  Patient presents with  . Follow-up    2 week post ICD implant//pt states she has no energy, SOB on exertion, has to stop and rest often.    History of Present Illness: Sheri Dunn is a 64 y.o. female with a history of S-D-CHF, ICM, NSTEMI 09/2014 w/ DES mCFX & dRCA, EF 20-25% 12/2014, s/p MDT single chamber ICD 04/27/2015.   Seen in office 11/30 and was volume overloaded, weight up 9 lbs. Lasix temporarily increased.   Sheri Dunn presents for follow-up of that visit.  [name] goal weight is 117 pounds. She is still having significant dyspnea on exertion and describes orthopnea as well. Her lower extremity edema has improved. She doesn't clearly have PND. Her appetite is still poor and she is not eating very much. She thinks she is eating a lot, but according to her family she eats very little and sometimes drinks and insure instead of eating food.  She is not waking up with edema but still has lower extremity edema during the day. She struggles with the concept of gaining weight well losing fluid and with the concept of getting swelling in her legs during the day. She is avoiding salt to foods as well as she can and cooking most of her own meals. She does not eat out much and eats almost no prepared foods.   Past Medical History  Diagnosis Date  . Hypertension   . HLD (hyperlipidemia)   . CAD (coronary artery disease)     a. NSTEMI 4/16:  LHC - mCFX 90, dRCA 90, EF 20% >> PCI: Synergy DES to mCFX and Synergy DES to dRCA  . Ischemic cardiomyopathy     a. Echo 4/16:  EF 25-30%, diff HK, ant-septal HK, Gr 1 DD, mod AI, mod LAE, mild RVE;  b.  Echo 7/16:  EF 20-25%, severe HK, Gr 1 DD, mod AI, mod MR, mod LAE, mod RVE, mod reduced RVSF, mild RAE  . Chronic systolic CHF  (congestive heart failure) (HCC)   . Prolonged Q-T interval on ECG   . Elevated ALT measurement     Past Surgical History  Procedure Laterality Date  . Left heart catheterization with coronary angiogram N/A 09/08/2014    Procedure: LEFT HEART CATHETERIZATION WITH CORONARY ANGIOGRAM;  Surgeon: Corky Crafts, MD;  Location: [facility] CATH LAB;  Service: Cardiovascular;  Laterality: N/A;  . Cardiac catheterization Right 09/08/2014    Procedure: CORONARY STENT INTERVENTION;  Surgeon: Corky Crafts, MD;  Location: [facility] CATH LAB;  Service: Cardiovascular;  Laterality: Right;  . Ep implantable device N/A 04/27/2015    Procedure: ICD Implant;  Surgeon: Thurmon Fair, MD; Medtronic Visia AF, Model [id number] serial number [id number]     Current Outpatient Prescriptions  Medication Sig Dispense Refill  . aspirin 81 MG chewable tablet Chew 1 tablet (81 mg total) by mouth daily. 30 tablet 3  . atorvastatin (LIPITOR) 20 MG tablet Take 1 tablet (20 mg total) by mouth daily. 30 tablet 11  . carvedilol (COREG) 12.5 MG tablet Take 1 tablet (12.5 mg total) by mouth 2 (two) times daily with a meal. 180 tablet 3  . Cholecalciferol (VITAMIN D3) 2000 UNITS TABS Take 2,000 Units by mouth daily.    . furosemide (LASIX) 40 MG  tablet Take 2 tablets (80 mg total) by mouth daily. WHEN YOUR WEIGHT REACHES 117 LBS REDUCE BACK TO 40MG  DAILY.take 1 every day 90 tablet 3  . potassium chloride (K-DUR) 10 MEQ tablet Take 1 tablet (10 mEq total) by mouth daily. 90 tablet 3  . prasugrel (EFFIENT) 10 MG TABS tablet Take 1 tablet (10 mg total) by mouth daily. 30 tablet 6  . valsartan (DIOVAN) 160 MG tablet Take 160 mg by mouth daily.  0   No current facility-administered medications for this visit.    Allergies:   Review of patient's allergies indicates no known allergies.    Social History:  The patient  reports that she has never smoked. She has never used smokeless tobacco. She reports that she does not drink alcohol or  use illicit drugs.   Family History:  The patient's family history includes Cystic fibrosis in her sister; Diabetes in her mother; Heart attack in her mother; Stroke in her mother.    ROS:  Please see the history of present illness. All other systems are reviewed and negative.    PHYSICAL EXAM: VS:  BP 126/76 mmHg  Pulse 76  Ht 5\' 6"  (1.676 m)  Wt 130 lb 9.6 oz (59.24 kg)  BMI 21.09 kg/m2 , BMI Body mass index is 21.09 kg/(m^2). GEN: Well nourished, thin elderly, female in no acute distress HEENT: normal for age  Neck: JVD at 10 cm with positive hepatojugular reflux, no carotid bruit, no masses;  Cardiac: RRR; soft systolic murmur, no rubs, or gallops; ICD site in upper left chest is well healed. Respiratory: Few rales bases bilaterally, normal work of breathing GI: soft, nontender, nondistended, + BS MS: no deformity or atrophy; 1-2 plus edema; distal pulses are 2+ in all 4 extremities  Skin: warm and dry, no rash Neuro:  Strength and sensation are intact Psych: euthymic mood, full affect   EKG:  EKG is not ordered today.  Recent Labs: 09/05/2014: B Natriuretic Peptide 4342.6*; TSH 1.940 09/08/2014: Magnesium 2.1 04/21/2015: BUN 17; Creat 0.90; Hemoglobin 14.1; Platelets 165; Potassium 4.2; Sodium 139 05/12/2015: ALT 29    Lipid Panel    Component Value Date/Time   CHOL 94 12/11/2014 1000   TRIG 64.0 12/11/2014 1000   HDL 30.80* 12/11/2014 1000   CHOLHDL 3 12/11/2014 1000   VLDL 12.8 12/11/2014 1000   LDLCALC 50 12/11/2014 1000     Wt Readings from Last 3 Encounters:  05/18/15 130 lb 9.6 oz (59.24 kg)  05/05/15 126 lb (57.153 kg)  04/28/15 117 lb 15.1 oz (53.5 kg)     Other studies Reviewed: Additional studies/ records that were reviewed today include: Hospital records, test results and office notes.  ASSESSMENT AND PLAN:  1.  Acute on chronic combined systolic and diastolic CHF: She is to get a BMET today. She is to pay attention to the amount of water she drinks  an drink 2 or 316 ounce bottles of water daily. She is to rinse frozen vegetables before cooking them. She is to drink Ensure twice a day as a snack and if she substitutes it for a meal, drink 2 of them. She is to continue daily weights and a 2000 g sodium diet. She is to continue the Lasix at 40 mg twice a day.  Although she feels that she has a very poor activity level, she was able to walk twice around the office and her oxygen saturation only decreased to 93%. She felt short of breath with this but  tolerated the exercise well. I feel there is an element of deconditioning to this as well as poor understanding of how to increase her activity gradually. She is compliant with her medications and is being very diet compliant as well so hopefully she will improve. For now, we will check a BMET to make sure her renal function is tolerating the increased Lasix. If she is successfully gaining weight we will have to watch her volume status carefully as she may not need to get all the way down to 117 pounds.  Current medicines are reviewed at length with the patient today.  The patient does not have concerns regarding medicines.  The following changes have been made:  no change  Labs/ tests ordered today include:   Orders Placed This Encounter  Procedures  . Basic metabolic panel     Disposition:   FU with Dr. Royann Shivers or myself in one month  Signed, Leanna Battles  05/18/2015 5:30 PM    [facility] [address] [place], [place], [place]  [address] Phone: [phone]; Fax: [phone]

## 2015-05-19 LAB — BASIC METABOLIC PANEL
BUN: 24 mg/dL (ref 7–25)
CALCIUM: 9.1 mg/dL (ref 8.6–10.4)
CHLORIDE: 107 mmol/L (ref 98–110)
CO2: 24 mmol/L (ref 20–31)
CREATININE: 0.92 mg/dL (ref 0.50–0.99)
GLUCOSE: 131 mg/dL — AB (ref 65–99)
Potassium: 4.3 mmol/L (ref 3.5–5.3)
Sodium: 142 mmol/L (ref 135–146)

## 2015-06-16 ENCOUNTER — Inpatient Hospital Stay (HOSPITAL_COMMUNITY)
Admission: AD | Admit: 2015-06-16 | Discharge: 2015-06-22 | DRG: 292 | Disposition: A | Discharge status: Home / Self Care | Payer: Medicare HMO | Source: Ambulatory Visit | Attending: Internal Medicine | Admitting: Internal Medicine

## 2015-06-16 ENCOUNTER — Encounter: Payer: Self-pay | Admitting: Physician Assistant

## 2015-06-16 ENCOUNTER — Ambulatory Visit (INDEPENDENT_AMBULATORY_CARE_PROVIDER_SITE_OTHER): Payer: Self-pay | Admitting: Physician Assistant

## 2015-06-16 VITALS — BP 124/70 | HR 76 | Ht 66.0 in | Wt 141.4 lb

## 2015-06-16 DIAGNOSIS — I2510 Atherosclerotic heart disease of native coronary artery without angina pectoris: Secondary | ICD-10-CM | POA: Diagnosis present

## 2015-06-16 DIAGNOSIS — Z95810 Presence of automatic (implantable) cardiac defibrillator: Secondary | ICD-10-CM | POA: Diagnosis present

## 2015-06-16 DIAGNOSIS — Z7982 Long term (current) use of aspirin: Secondary | ICD-10-CM | POA: Diagnosis not present

## 2015-06-16 DIAGNOSIS — Z955 Presence of coronary angioplasty implant and graft: Secondary | ICD-10-CM | POA: Diagnosis not present

## 2015-06-16 DIAGNOSIS — E785 Hyperlipidemia, unspecified: Secondary | ICD-10-CM | POA: Diagnosis present

## 2015-06-16 DIAGNOSIS — I5023 Acute on chronic systolic (congestive) heart failure: Secondary | ICD-10-CM

## 2015-06-16 DIAGNOSIS — Z833 Family history of diabetes mellitus: Secondary | ICD-10-CM

## 2015-06-16 DIAGNOSIS — I351 Nonrheumatic aortic (valve) insufficiency: Secondary | ICD-10-CM | POA: Diagnosis present

## 2015-06-16 DIAGNOSIS — I340 Nonrheumatic mitral (valve) insufficiency: Secondary | ICD-10-CM | POA: Diagnosis present

## 2015-06-16 DIAGNOSIS — I255 Ischemic cardiomyopathy: Secondary | ICD-10-CM | POA: Diagnosis present

## 2015-06-16 DIAGNOSIS — I110 Hypertensive heart disease with heart failure: Secondary | ICD-10-CM | POA: Diagnosis not present

## 2015-06-16 DIAGNOSIS — Z8349 Family history of other endocrine, nutritional and metabolic diseases: Secondary | ICD-10-CM | POA: Diagnosis not present

## 2015-06-16 DIAGNOSIS — I4892 Unspecified atrial flutter: Secondary | ICD-10-CM | POA: Diagnosis present

## 2015-06-16 DIAGNOSIS — I5043 Acute on chronic combined systolic (congestive) and diastolic (congestive) heart failure: Secondary | ICD-10-CM

## 2015-06-16 DIAGNOSIS — I119 Hypertensive heart disease without heart failure: Secondary | ICD-10-CM

## 2015-06-16 DIAGNOSIS — I252 Old myocardial infarction: Secondary | ICD-10-CM | POA: Diagnosis not present

## 2015-06-16 DIAGNOSIS — Z823 Family history of stroke: Secondary | ICD-10-CM | POA: Diagnosis not present

## 2015-06-16 DIAGNOSIS — Z9861 Coronary angioplasty status: Secondary | ICD-10-CM

## 2015-06-16 DIAGNOSIS — I10 Essential (primary) hypertension: Secondary | ICD-10-CM | POA: Diagnosis not present

## 2015-06-16 LAB — CBC WITH DIFFERENTIAL/PLATELET
BASOS PCT: 1 %
Basophils Absolute: 0 10*3/uL (ref 0.0–0.1)
EOS ABS: 0.1 10*3/uL (ref 0.0–0.7)
EOS PCT: 2 %
HCT: 42.5 % (ref 36.0–46.0)
Hemoglobin: 13.9 g/dL (ref 12.0–15.0)
Lymphocytes Relative: 34 %
Lymphs Abs: 2 10*3/uL (ref 0.7–4.0)
MCH: 28 pg (ref 26.0–34.0)
MCHC: 32.7 g/dL (ref 30.0–36.0)
MCV: 85.7 fL (ref 78.0–100.0)
MONO ABS: 0.6 10*3/uL (ref 0.1–1.0)
MONOS PCT: 10 %
Neutro Abs: 3.1 10*3/uL (ref 1.7–7.7)
Neutrophils Relative %: 53 %
PLATELETS: 172 10*3/uL (ref 150–400)
RBC: 4.96 MIL/uL (ref 3.87–5.11)
RDW: 16.9 % — AB (ref 11.5–15.5)
WBC: 6 10*3/uL (ref 4.0–10.5)

## 2015-06-16 LAB — PACEMAKER DEVICE OBSERVATION

## 2015-06-16 LAB — COMPREHENSIVE METABOLIC PANEL
ALK PHOS: 237 U/L — AB (ref 38–126)
ALT: 25 U/L (ref 14–54)
AST: 32 U/L (ref 15–41)
Albumin: 3.2 g/dL — ABNORMAL LOW (ref 3.5–5.0)
Anion gap: 8 (ref 5–15)
BILIRUBIN TOTAL: 1 mg/dL (ref 0.3–1.2)
BUN: 22 mg/dL — AB (ref 6–20)
CO2: 29 mmol/L (ref 22–32)
CREATININE: 0.93 mg/dL (ref 0.44–1.00)
Calcium: 8.9 mg/dL (ref 8.9–10.3)
Chloride: 105 mmol/L (ref 101–111)
GFR calc Af Amer: >60 mL/min (ref >60)
Glucose, Bld: 97 mg/dL (ref 65–99)
Potassium: 4.5 mmol/L (ref 3.5–5.1)
Sodium: 142 mmol/L (ref 135–145)
TOTAL PROTEIN: 6.8 g/dL (ref 6.5–8.1)

## 2015-06-16 LAB — MAGNESIUM: MAGNESIUM: 2.3 mg/dL (ref 1.7–2.4)

## 2015-06-16 LAB — BRAIN NATRIURETIC PEPTIDE: B Natriuretic Peptide: >4500 pg/mL — ABNORMAL HIGH (ref 0.0–100.0)

## 2015-06-16 MED ORDER — ATORVASTATIN CALCIUM 20 MG PO TABS
20.0000 mg | ORAL_TABLET | Freq: Every day | ORAL | Status: DC
  Administered 2015-06-17 – 2015-06-22 (×6): 20 mg via ORAL
  Filled 2015-06-16 (×6): qty 1

## 2015-06-16 MED ORDER — FUROSEMIDE 10 MG/ML IJ SOLN
40.0000 mg | Freq: Two times a day (BID) | INTRAMUSCULAR | Status: DC
  Administered 2015-06-16 – 2015-06-21 (×10): 40 mg via INTRAVENOUS
  Filled 2015-06-16 (×11): qty 4

## 2015-06-16 MED ORDER — ALPRAZOLAM 0.25 MG PO TABS: 0.2500 mg | ORAL_TABLET | Freq: Two times a day (BID) | ORAL | Status: DC | PRN

## 2015-06-16 MED ORDER — CARVEDILOL 12.5 MG PO TABS
12.5000 mg | ORAL_TABLET | Freq: Two times a day (BID) | ORAL | Status: DC
  Administered 2015-06-17 (×2): 12.5 mg via ORAL
  Filled 2015-06-16 (×2): qty 1

## 2015-06-16 MED ORDER — ASPIRIN EC 81 MG PO TBEC
81.0000 mg | DELAYED_RELEASE_TABLET | Freq: Every day | ORAL | Status: DC
  Administered 2015-06-17 – 2015-06-22 (×6): 81 mg via ORAL
  Filled 2015-06-16 (×6): qty 1

## 2015-06-16 MED ORDER — ACETAMINOPHEN 325 MG PO TABS: 650.0000 mg | ORAL_TABLET | ORAL | Status: DC | PRN

## 2015-06-16 MED ORDER — ENOXAPARIN SODIUM 40 MG/0.4ML ~~LOC~~ SOLN
40.0000 mg | SUBCUTANEOUS | Status: DC
  Administered 2015-06-16 – 2015-06-21 (×6): 40 mg via SUBCUTANEOUS
  Filled 2015-06-16 (×6): qty 0.4

## 2015-06-16 MED ORDER — PRASUGREL HCL 10 MG PO TABS
10.0000 mg | ORAL_TABLET | Freq: Every day | ORAL | Status: DC
  Administered 2015-06-17 – 2015-06-22 (×6): 10 mg via ORAL
  Filled 2015-06-16 (×6): qty 1

## 2015-06-16 MED ORDER — IRBESARTAN 300 MG PO TABS
150.0000 mg | ORAL_TABLET | Freq: Every day | ORAL | Status: DC
  Administered 2015-06-17: 150 mg via ORAL
  Filled 2015-06-16: qty 1

## 2015-06-16 MED ORDER — ZOLPIDEM TARTRATE 5 MG PO TABS: 5.0000 mg | ORAL_TABLET | Freq: Every evening | ORAL | Status: DC | PRN

## 2015-06-16 MED ORDER — ONDANSETRON HCL 4 MG/2ML IJ SOLN: 4.0000 mg | Freq: Four times a day (QID) | INTRAMUSCULAR | Status: DC | PRN

## 2015-06-16 MED ORDER — SODIUM CHLORIDE 0.9 % IV SOLN: 250.0000 mL | INTRAVENOUS | Status: DC | PRN

## 2015-06-16 MED ORDER — SODIUM CHLORIDE 0.9 % IJ SOLN: 3.0000 mL | INTRAMUSCULAR | Status: DC | PRN

## 2015-06-16 MED ORDER — SODIUM CHLORIDE 0.9 % IJ SOLN
3.0000 mL | Freq: Two times a day (BID) | INTRAMUSCULAR | Status: DC
  Administered 2015-06-16 – 2015-06-22 (×9): 3 mL via INTRAVENOUS

## 2015-06-16 MED ORDER — POTASSIUM CHLORIDE ER 10 MEQ PO TBCR
10.0000 meq | EXTENDED_RELEASE_TABLET | Freq: Every day | ORAL | Status: DC
  Administered 2015-06-17 – 2015-06-22 (×6): 10 meq via ORAL
  Filled 2015-06-16 (×12): qty 1

## 2015-06-16 NOTE — Progress Notes (Signed)
Cardiology Office Note   Date:  06/16/2015   ID:  Sheri Dunn, DOB 1950/07/10, MRN [id number]  PCP:  No PCP Per Patient  Cardiologist:  Dr Chrisandra Netters, PA-C   Chief Complaint  Patient presents with  . Follow-up     no chest pain, shortness of breath-has been breathing real hard and fast, has edema all over body, no pain in legs, no cramping in legs, no lightheadedness or dizziness    History of Present Illness: Sheri Dunn is a 65 y.o. female with a history of S-D-CHF, ICM, NSTEMI 09/2014 w/ DES mCFX & dRCA, EF 20-25% 12/2014, s/p MDT single chamber ICD 04/27/2015.   05/05/2015, seen in office and weight was 126 pounds, up 9 pounds, Lasix increased  05/18/2015, seen in office describing dyspnea on exertion and orthopnea, but stated her symptoms had improved. She walked around the office and did well, no significant hypoxia. Her weight was 130 pounds at that time. She was to continue on the increased dose of Lasix. She began tracking her weight.  Sheri Dunn presents for evaluation of shortness of breath.  Since Sheri Dunn was last seen in the office, she has gained 11 pounds. Her ability to exert herself has decreased. She reports significant daytime lower extremity edema and doesn't think she has any when she wakes up in the morning. She denies orthopnea or PND, but states that she gets short of breath all of a sudden just sitting on the couch at times. She feels that she has too much fluid all over, and even her face is puffy. She has not had chest pain or palpitations. Her ICD site is a little tender, but there is no drainage and she has had no fevers.   Past Medical History  Diagnosis Date  . Hypertension   . HLD (hyperlipidemia)   . CAD (coronary artery disease)     a. NSTEMI 4/16:  LHC - mCFX 90, dRCA 90, EF 20% >> PCI: Synergy DES to mCFX and Synergy DES to dRCA  . Ischemic cardiomyopathy     a. Echo 4/16:  EF 25-30%, diff HK, ant-septal HK, Gr 1  DD, mod AI, mod LAE, mild RVE;  b.  Echo 7/16:  EF 20-25%, severe HK, Gr 1 DD, mod AI, mod MR, mod LAE, mod RVE, mod reduced RVSF, mild RAE  . Chronic systolic CHF (congestive heart failure) (HCC)   . Prolonged Q-T interval on ECG   . Elevated ALT measurement     Past Surgical History  Procedure Laterality Date  . Left heart catheterization with coronary angiogram N/A 09/08/2014    Procedure: LEFT HEART CATHETERIZATION WITH CORONARY ANGIOGRAM;  Surgeon: Corky Crafts, MD;  Location[facility] CATH LAB;  Service: Cardiovascular;  Laterality: N/A;  . Cardiac catheterization Right 09/08/2014    Procedure: CORONARY STENT INTERVENTION;  Surgeon: Corky Crafts, MD;  Location: [facility] CATH LAB;  Service: Cardiovascular;  Laterality: Right;  . Ep implantable device N/A 04/27/2015    Procedure: ICD Implant;  Surgeon: Thurmon Fair, MD; Medtronic Visia AF, Model [id number] serial number [id number]     Current Outpatient Prescriptions  Medication Sig Dispense Refill  . aspirin 81 MG tablet Take 81 mg by mouth daily.    Marland Kitchen atorvastatin (LIPITOR) 20 MG tablet Take 1 tablet (20 mg total) by mouth daily. 30 tablet 11  . carvedilol (COREG) 12.5 MG tablet Take 1 tablet (12.5 mg total) by mouth 2 (two) times daily with a meal. 180  tablet 3  . Cholecalciferol (VITAMIN D3) 2000 UNITS TABS Take 2,000 Units by mouth daily.    . furosemide (LASIX) 40 MG tablet Take 2 tablets (80 mg total) by mouth daily. WHEN YOUR WEIGHT REACHES 117 LBS REDUCE BACK TO 40MG  DAILY.take 1 every day 90 tablet 3  . potassium chloride (K-DUR) 10 MEQ tablet Take 1 tablet (10 mEq total) by mouth daily. 90 tablet 3  . prasugrel (EFFIENT) 10 MG TABS tablet Take 1 tablet (10 mg total) by mouth daily. 30 tablet 6  . valsartan (DIOVAN) 160 MG tablet Take 160 mg by mouth daily.  0   No current facility-administered medications for this visit.    Allergies:   Review of patient's allergies indicates no known allergies.    Social History:  The  patient  reports that she has never smoked. She has never used smokeless tobacco. She reports that she does not drink alcohol or use illicit drugs.   Family History:  The patient's family history includes Cystic fibrosis in her sister; Diabetes in her mother; Heart attack in her mother; Stroke in her mother.    ROS:  Please see the history of present illness. All other systems are reviewed and negative.    PHYSICAL EXAM: VS:  BP 124/70 mmHg  Pulse 76  Ht 5\' 6"  (1.676 m)  Wt 141 lb 7 oz (64.156 kg)  BMI 22.84 kg/m2 , BMI Body mass index is 22.84 kg/(m^2). GEN: Well nourished, well developed, female in moderate respiratory distress HEENT: normal for age  Neck: JVD to jaw, no carotid bruit, no masses Cardiac: RRR; 2/6 murmur, no rubs, or gallops Respiratory: Rales bases bilaterally, increased work of breathing GI: soft, nontender, nondistended, + BS MS: no deformity or atrophy; 2+ edema; distal pulses are 2+ in all 4 extremities  Skin: warm and dry, no rash Neuro:  Strength and sensation are intact Psych: euthymic mood, full affect   EKG:  EKG is not ordered today.  Recent Labs: 09/05/2014: B Natriuretic Peptide 4342.6*; TSH 1.940 09/08/2014: Magnesium 2.1 04/21/2015: Hemoglobin 14.1; Platelets 165 05/12/2015: ALT 29 05/18/2015: BUN 24; Creat 0.92; Potassium 4.3; Sodium 142    Lipid Panel    Component Value Date/Time   CHOL 94 12/11/2014 1000   TRIG 64.0 12/11/2014 1000   HDL 30.80* 12/11/2014 1000   CHOLHDL 3 12/11/2014 1000   VLDL 12.8 12/11/2014 1000   LDLCALC 50 12/11/2014 1000     Wt Readings from Last 3 Encounters:  06/16/15 141 lb 7 oz (64.156 kg)  05/18/15 130 lb 9.6 oz (59.24 kg)  05/05/15 126 lb (57.153 kg)     Other studies Reviewed: Additional studies/ records that were reviewed today include: Previous office notes and other records.  ASSESSMENT AND PLAN: Dr. Rennis GoldenHilty was in to examine the patient  1.  Acute on chronic systolic CHF: Ms. Sheri Dunn has continued  to take her Lasix twice a day but her weight has continued to increase. According to her weight chart, she has had a steady increase in her weight. She has not using salt and feels that she is not drinking too much water.  The patient was very upset in the office and states that she just can't go on like this. She is significantly volume overloaded by exam and her weight has continued to go up despite an increase in her Lasix dose. Admission to the hospital is required to start her on IV Lasix and get her diuresed. A home health nurse after discharge may  help to make sure that she is not getting hidden salt in foods. She will need close monitoring of her BUN and creatinine while hospitalized.  2. Medtronic ICD in situ: We will interrogate the device today  Current medicines are reviewed at length with the patient today.  The patient does not have concerns regarding medicines.  The following changes have been made:  no change at this time  Labs/ tests ordered today include:  No orders of the defined types were placed in this encounter.     Disposition:   FU with Dr. Royann Shivers   Signed, Leanna Battles  06/16/2015 2:08 PM    [facility] [address] [place], [place], [place]  [address] Phone: [phone]; Fax: [phone]

## 2015-06-16 NOTE — Patient Instructions (Signed)
[facility]. [phone].  [facility].

## 2015-06-16 NOTE — H&P (Signed)
Cardiology Office Note   Date: 06/16/2015   ID: Sheri Dunn, DOB [birth year], MRN [id number]  PCP: No PCP Per Patient Cardiologist: Dr Chrisandra Netters, PA-C   Chief Complaint  Patient presents with  . Follow-up    no chest pain, shortness of breath-has been breathing real hard and fast, has edema all over body, no pain in legs, no cramping in legs, no lightheadedness or dizziness    History of Present Illness: Sheri Dunn is a 65 y.o. female with a history of S-D-CHF, ICM, NSTEMI 09/2014 w/ DES mCFX & dRCA, EF 20-25% 12/2014, s/p MDT single chamber ICD 04/27/2015.   05/05/2015, seen in office and weight was 126 pounds, up 9 pounds, Lasix increased  05/18/2015, seen in office describing dyspnea on exertion and orthopnea, but stated her symptoms had improved. She walked around the office and did well, no significant hypoxia. Her weight was 130 pounds at that time. She was to continue on the increased dose of Lasix. She began tracking her weight.  Sheri Dunn presents for evaluation of shortness of breath.  Since Sheri Dunn was last seen in the office, she has gained 11 pounds. Her ability to exert herself has decreased. She reports significant daytime lower extremity edema and doesn't think she has any when she wakes up in the morning. She denies orthopnea or PND, but states that she gets short of breath all of a sudden just sitting on the couch at times. She says she can't do anything much at all without getting short of breath. She feels that she has too much fluid all over, and even her face is puffy. She has not had chest pain or palpitations. Her ICD site is a little tender, but there is no drainage and she has had no fevers.   Past Medical History  Diagnosis Date  . Hypertension   . HLD (hyperlipidemia)   . CAD (coronary artery disease)     a. NSTEMI 4/16: LHC - mCFX 90, dRCA 90, EF 20% >> PCI: Synergy DES to mCFX and  Synergy DES to dRCA  . Ischemic cardiomyopathy     a. Echo 4/16: EF 25-30%, diff HK, ant-septal HK, Gr 1 DD, mod AI, mod LAE, mild RVE; b. Echo 7/16: EF 20-25%, severe HK, Gr 1 DD, mod AI, mod MR, mod LAE, mod RVE, mod reduced RVSF, mild RAE  . Chronic systolic CHF (congestive heart failure) (HCC)   . Prolonged Q-T interval on ECG   . Elevated ALT measurement     Past Surgical History  Procedure Laterality Date  . Left heart catheterization with coronary angiogram N/A 09/08/2014    Procedure: LEFT HEART CATHETERIZATION WITH CORONARY ANGIOGRAM; Surgeon: Corky Crafts, MD; Location: [facility] C[facility] LAB; Service: Cardiovascular; Laterality: N/A;  . Cardiac catheterization Right 09/08/2014    Procedure: CORONARY STENT INTERVENTION; Surgeon: Corky Crafts, MD; Location[facility] CATH LAB; Service: Cardiovascular; Laterality: Right;  . Ep implantable device N/A 04/27/2015    Procedure: ICD Implant; Surgeon: Thurmon Fair, MD; Medtronic Visia AF, Model DVAB1D4 serial number [id number]     Current Outpatient Prescriptions  Medication Sig Dispense Refill  . aspirin 81 MG tablet Take 81 mg by mouth daily.    Marland Kitchen atorvastatin (LIPITOR) 20 MG tablet Take 1 tablet (20 mg total) by mouth daily. 30 tablet 11  . carvedilol (COREG) 12.5 MG tablet Take 1 tablet (12.5 mg total) by mouth 2 (two) times daily with a meal. 180 tablet 3  . Cholecalciferol (VITAMIN D3)  2000 UNITS TABS Take 2,000 Units by mouth daily.    . furosemide (LASIX) 40 MG tablet Take 2 tablets (80 mg total) by mouth daily. WHEN YOUR WEIGHT REACHES 117 LBS REDUCE BACK TO 40MG  DAILY.take 1 every day 90 tablet 3  . potassium chloride (K-DUR) 10 MEQ tablet Take 1 tablet (10 mEq total) by mouth daily. 90 tablet 3  . prasugrel (EFFIENT) 10 MG TABS tablet Take 1 tablet (10 mg total) by mouth daily. 30 tablet 6  . valsartan (DIOVAN) 160 MG tablet Take 160  mg by mouth daily.  0   No current facility-administered medications for this visit.    Allergies: Review of patient's allergies indicates no known allergies.    Social History: The patient  reports that she has never smoked. She has never used smokeless tobacco. She reports that she does not drink alcohol or use illicit drugs.   Family History: The patient's family history includes Cystic fibrosis in her sister; Diabetes in her mother; Heart attack in her mother; Stroke in her mother.    ROS: Please see the history of present illness. All other systems are reviewed and negative.    PHYSICAL EXAM: VS: BP 124/70 mmHg  Pulse 76  Ht 5\' 6"  (1.676 m)  Wt 141 lb 7 oz (64.156 kg)  BMI 22.84 kg/m2 , BMI Body mass index is 22.84 kg/(m^2). GEN: Well nourished, well developed, female in moderate respiratory distress  HEENT: normal for age  Neck: JVD to jaw, no carotid bruit, no masses Cardiac: RRR; 2/6 murmur, no rubs, or gallops Respiratory: Rales bases bilaterally, increased work of breathing GI: soft, nontender, nondistended, + BS MS: no deformity or atrophy; 2+ edema; distal pulses are 2+ in all 4 extremities  Skin: warm and dry, no rash Neuro: Strength and sensation are intact Psych: euthymic mood, full affect   EKG: EKG is not ordered today.  Recent Labs: 09/05/2014: B Natriuretic Peptide 4342.6*; TSH 1.940 09/08/2014: Magnesium 2.1 04/21/2015: Hemoglobin 14.1; Platelets 165 05/12/2015: ALT 29 05/18/2015: BUN 24; Creat 0.92; Potassium 4.3; Sodium 142    Lipid Panel  Labs (Brief)       Component Value Date/Time   CHOL 94 12/11/2014 1000   TRIG 64.0 12/11/2014 1000   HDL 30.80* 12/11/2014 1000   CHOLHDL 3 12/11/2014 1000   VLDL 12.8 12/11/2014 1000   LDLCALC 50 12/11/2014 1000      Wt Readings from Last 3 Encounters:  06/16/15 141 lb 7 oz (64.156 kg)  05/18/15 130 lb 9.6 oz (59.24 kg)  05/05/15 126 lb (57.153 kg)      Other studies Reviewed: Additional studies/ records that were reviewed today include: Previous office notes and other records.  ASSESSMENT AND PLAN: Dr. Rennis GoldenHilty was in to examine the patient  1. Acute on chronic systolic CHF: Sheri Dunn has continued to take her Lasix twice a day but her weight has continued to increase. According to her weight chart, she has had a steady increase in her weight. She has not using salt and feels that she is not drinking too much water.  The patient was very upset in the office and states that she just can't go on like this. She is significantly volume overloaded by exam and her weight has continued to go up despite an increase in her Lasix dose. Admission to the hospital is required to start her on IV Lasix and get her diuresed. A home health nurse after discharge may help to make sure that she is not  getting hidden salt in foods. She will need close monitoring of her BUN and creatinine while hospitalized.  Consider starting Entresto instead of losartan.  2. Medtronic ICD in situ: We will interrogate the device today  Current medicines are reviewed at length with the patient today. The patient does not have concerns regarding medicines.  The following changes have been made: no change at this time  Labs/ tests ordered today include:  No orders of the defined types were placed in this encounter.    Disposition: FU with Dr. Royann Shivers   Signed, Leanna Battles  06/16/2015 2:08 PM  [facility] [address] [place], [place], [place] [address] Phone: [phone]; Fax: [phone]

## 2015-06-16 NOTE — Progress Notes (Signed)
Pt arrived as a direct admit from MD office to unit at 1700. Pt A&O x4; pt oriented to the unit and room. Pt c/o SOB, 2L oxygen given and effective; telemetry applied and verified with CCMD and NT; pt educated on fall precaution and safety which pt voices understanding. Pt oriented to the unit and room; IV established to R FA. Pt in bed comfortably with call light within reach. Reported off to oncoming RN. Dionne BucyP. Amo Sherrilynn Gudgel RN

## 2015-06-17 ENCOUNTER — Inpatient Hospital Stay (HOSPITAL_COMMUNITY): Payer: Medicare HMO

## 2015-06-17 DIAGNOSIS — E785 Hyperlipidemia, unspecified: Secondary | ICD-10-CM

## 2015-06-17 DIAGNOSIS — Z95810 Presence of automatic (implantable) cardiac defibrillator: Secondary | ICD-10-CM

## 2015-06-17 DIAGNOSIS — I5043 Acute on chronic combined systolic (congestive) and diastolic (congestive) heart failure: Secondary | ICD-10-CM

## 2015-06-17 DIAGNOSIS — I351 Nonrheumatic aortic (valve) insufficiency: Secondary | ICD-10-CM

## 2015-06-17 DIAGNOSIS — I340 Nonrheumatic mitral (valve) insufficiency: Secondary | ICD-10-CM

## 2015-06-17 DIAGNOSIS — I10 Essential (primary) hypertension: Secondary | ICD-10-CM

## 2015-06-17 DIAGNOSIS — I2510 Atherosclerotic heart disease of native coronary artery without angina pectoris: Secondary | ICD-10-CM

## 2015-06-17 LAB — BASIC METABOLIC PANEL
Anion gap: 7 (ref 5–15)
BUN: 21 mg/dL — ABNORMAL HIGH (ref 6–20)
CALCIUM: 8.6 mg/dL — AB (ref 8.9–10.3)
CHLORIDE: 108 mmol/L (ref 101–111)
CO2: 26 mmol/L (ref 22–32)
CREATININE: 0.91 mg/dL (ref 0.44–1.00)
GFR calc Af Amer: >60 mL/min (ref >60)
GFR calc non Af Amer: >60 mL/min (ref >60)
GLUCOSE: 115 mg/dL — AB (ref 65–99)
Potassium: 4.3 mmol/L (ref 3.5–5.1)
Sodium: 141 mmol/L (ref 135–145)

## 2015-06-17 MED ORDER — CARVEDILOL 12.5 MG PO TABS
12.5000 mg | ORAL_TABLET | Freq: Two times a day (BID) | ORAL | Status: DC
  Administered 2015-06-18 – 2015-06-22 (×9): 12.5 mg via ORAL
  Filled 2015-06-17 (×11): qty 1

## 2015-06-17 MED ORDER — IRBESARTAN 300 MG PO TABS
300.0000 mg | ORAL_TABLET | Freq: Every day | ORAL | Status: DC
  Administered 2015-06-18: 300 mg via ORAL
  Filled 2015-06-17 (×2): qty 1

## 2015-06-17 NOTE — Progress Notes (Signed)
Utilization review completed. Kenzley Ke, RN, BSN. 

## 2015-06-17 NOTE — Progress Notes (Signed)
Patient Name: Sheri Dunn Date of Encounter: 06/17/2015  Active Problems:   Acute on chronic combined systolic and diastolic congestive heart failure, NYHA class 4 (HCC)   Length of Stay: 1  SUBJECTIVE  "I'm feeling so much better". Dyspnea greatly improved.  Excellent diuretic response.  CURRENT MEDS . aspirin EC  81 mg Oral Daily  . atorvastatin  20 mg Oral Daily  . carvedilol  12.5 mg Oral BID WC  . enoxaparin (LOVENOX) injection  40 mg Subcutaneous Q24H  . furosemide  40 mg Intravenous BID  . irbesartan  150 mg Oral Daily  . potassium chloride  10 mEq Oral Daily  . prasugrel  10 mg Oral Daily  . sodium chloride  3 mL Intravenous Q12H    OBJECTIVE   Intake/Output Summary (Last 24 hours) at 06/17/15 1310 Last data filed at 06/17/15 1108  Gross per 24 hour  Intake    360 ml  Output   2925 ml  Net  -2565 ml   Filed Weights   06/16/15 1700 06/17/15 0549  Weight: 137 lb 12.8 oz (62.506 kg) 136 lb 14.4 oz (62.097 kg)    PHYSICAL EXAM Filed Vitals:   06/16/15 2140 06/17/15 0024 06/17/15 0549 06/17/15 0752  BP: 124/62 118/68 110/92 123/69  Pulse: 74 74 80 73  Temp: 98 F (36.7 C) 98.4 F (36.9 C) 99 F (37.2 C) 98.3 F (36.8 C)  TempSrc: Oral Oral Oral Oral  Resp: 20 18 20 20   Height:      Weight:   136 lb 14.4 oz (62.097 kg)   SpO2: 100% 97% 99% 93%   General: Alert, oriented x3, no distress Head: no evidence of trauma, PERRL, EOMI, no exophtalmos or lid lag, no myxedema, no xanthelasma; normal ears, nose and oropharynx Neck: 8-9 cm elevation in jugular venous pulsations and prompt hepatojugular reflux; brisk carotid pulses without delay and no carotid bruits Chest: clear to auscultation, no signs of consolidation by percussion or palpation, normal fremitus, symmetrical and full respiratory excursions Cardiovascular: normal position and quality of the apical impulse, regular rhythm, normal first and second heart sounds, no rubs, +ve S3, no murmur Abdomen:  no tenderness or distention, no masses by palpation, no abnormal pulsatility or arterial bruits, normal bowel sounds, no hepatosplenomegaly Extremities: no clubbing, cyanosis; 2-3+ ankle and pretibial edema; 2+ radial, ulnar and brachial pulses bilaterally; 2+ right femoral, posterior tibial and dorsalis pedis pulses; 2+ left femoral, posterior tibial and dorsalis pedis pulses; no subclavian or femoral bruits Neurological: grossly nonfocal  LABS  CBC  Recent Labs  06/16/15 2009  WBC 6.0  NEUTROABS 3.1  HGB 13.9  HCT 42.5  MCV 85.7  PLT 172   Basic Metabolic Panel  Recent Labs  06/16/15 2009 06/17/15 0409  NA 142 141  K 4.5 4.3  CL 105 108  CO2 29 26  GLUCOSE 97 115*  BUN 22* 21*  CREATININE 0.93 0.91  CALCIUM 8.9 8.6*  MG 2.3  --    Liver Function Tests  Recent Labs  06/16/15 2009  AST 32  ALT 25  ALKPHOS 237*  BILITOT 1.0  PROT 6.8  ALBUMIN 3.2*   Radiology Studies Imaging results have been reviewed and Dg Chest Port 1 View  06/17/2015  CLINICAL DATA:  A cute and chronic CHF EXAM: PORTABLE CHEST 1 VIEW COMPARISON:  PA and lateral chest x-ray of April 28, 2015 FINDINGS: The cardiopericardial silhouette remains enlarged. The pulmonary vascularity is mild prominent centrally but definite cephalization is not  observed. There small bilateral pleural effusions. The permanent pacemaker defibrillator is in reasonable position. IMPRESSION: Allowing for slight decreased inflation of the lungs there has not been significant interval change. There remain small bilateral pleural effusions. Stable moderate cardiomegaly and central pulmonary vascular prominence. Electronically Signed   By: David  SwazilandJordan M.D.   On: 06/17/2015 07:48    TELE NSR  ECG NSR, LA abnormality, inferolateral ST changes  ASSESSMENT AND PLAN  1. Acute on chronic systolic and diastolic heart failure Still markedly hypervolemic. At pre-ICD visit she weighed 123 lb and still appeared a little "wet".  At one point we had estimated her dry weight to be 117 lb. She is still well above either one of those.thresholds Continue diuresis. Will also try to push ARB dose.  2. Severe ischemic cardiomyopathy with left ventricular ejection fraction 20-25%, not improved despite two-vessel revascularization and medical therapy for congestive heart failure. Echo shows evidence of severe to dysfunction of the left ventricle and moderate dysfunction of the right ventricle.  3. CAD s/p DES to mid LCX and distal RCA April 2016. She is to remain on dual antiplatelet therapy until next April at least.   4. Hyperlipidemia. LFTs now normal. Statin had been stopped for high LFTs. Reevaluate lipids and restart statin.  5. Moderate aortic insufficiency and moderate mitral insufficiency. Re-evaluate by echo.  6. Essential hypertension, controlled. A little more room for higher ARB dose right now, will try to titrate up    Thurmon FairMihai Paris Hohn, MD, [facility]ACC [facility] [phone] office [phone] pager 06/17/2015 1:10 PM

## 2015-06-17 NOTE — Progress Notes (Signed)
Benefit check for Anson Croftsntresto     Dora Greenlee CMA           S/W TIA @ [employer] RX # [phone]   ENTRESTO 49/51 MG BID A DAY   COVER- YES  CO-PAY- $ 47.00  TIER- 3 DRUG  PRIOR APPROVAL - YES # [phone]

## 2015-06-17 NOTE — Progress Notes (Signed)
At 1500 introduced self as incoming nurse 3p-7p  Call bell at reach.  Instructed to call for assistance.  Verbalized understanding.  Amanda PeaNellie Raylie Maddison, Charity fundraiserN.

## 2015-06-17 NOTE — Progress Notes (Signed)
Heart Failure Navigator Consult Note  Presentation: Sheri CurlsCarolyn Dunn is a 65 y.o. female with a history of S-D-CHF, ICM, NSTEMI 09/2014 w/ DES mCFX & dRCA, EF 20-25% 12/2014, s/p MDT single chamber ICD 04/27/2015.  Since [name] was last seen in the office, she has gained 11 pounds. Her ability to exert herself has decreased. She reports significant daytime lower extremity edema and doesn't think she has any when she wakes up in the morning. She denies orthopnea or PND, but states that she gets short of breath all of a sudden just sitting on the couch at times. She says she can't do anything much at all without getting short of breath. She feels that she has too much fluid all over, and even her face is puffy. She has not had chest pain or palpitations. Her ICD site is a little tender, but there is no drainage and she has had no fevers.   Past Medical History  Diagnosis Date  . Hypertension   . HLD (hyperlipidemia)   . CAD (coronary artery disease)     a. NSTEMI 4/16:  LHC - mCFX 90, dRCA 90, EF 20% >> PCI: Synergy DES to mCFX and Synergy DES to dRCA  . Ischemic cardiomyopathy     a. Echo 4/16:  EF 25-30%, diff HK, ant-septal HK, Gr 1 DD, mod AI, mod LAE, mild RVE;  b.  Echo 7/16:  EF 20-25%, severe HK, Gr 1 DD, mod AI, mod MR, mod LAE, mod RVE, mod reduced RVSF, mild RAE  . Chronic systolic CHF (congestive heart failure) (HCC)   . Prolonged Q-T interval on ECG   . Elevated ALT measurement     Social History   Social History  . Marital Status: Single    Spouse Name: N/A  . Number of Children: 1  . Years of Education: N/A   Social History Main Topics  . Smoking status: Never Smoker   . Smokeless tobacco: Never Used  . Alcohol Use: No  . Drug Use: No  . Sexual Activity: No   Other Topics Concern  . Not on file   Social History Narrative    ECHO:Study Conclusions--12/11/14  - Left ventricle: The cavity size was moderately dilated. Wall thickness was normal. Systolic function  was severely reduced. The estimated ejection fraction was in the range of 20% to 25%. Severe hypokinesis of the entire myocardium. Doppler parameters are consistent with abnormal left ventricular relaxation (grade 1 diastolic dysfunction). - Aortic valve: There was moderate regurgitation. - Mitral valve: There was moderate regurgitation. - Left atrium: The atrium was moderately dilated. - Right ventricle: The cavity size was moderately dilated. Systolic function was moderately reduced. - Right atrium: The atrium was mildly dilated.  ------------------------------------------------------------------- Labs, prior tests, procedures, and surgery: Transthoracic echocardiography (09/07/2014).   EF was 30%.  Transthoracic echocardiography. M-mode, complete 2D, spectral Doppler, and color Doppler. Birthdate: Patient birthdate: 29-Aug-1950. Age: Patient is 65 yr old. Sex: Gender: female. BMI: 19.5 kg/m^2. Blood pressure:   110/70 Patient status: Outpatient. Study date: Study date: 12/11/2014. Study time: 10:20 AM. Location: [facility] BNP    Component Value Date/Time   BNP >4500.0* 06/16/2015 2009    ProBNP    Component Value Date/Time   PROBNP 322.4* 03/09/2013 1646     Education Assessment and Provision:  Detailed education and instructions provided on heart failure disease management including the following:  Signs and symptoms of Heart Failure When to call the physician Importance of daily weights Low sodium  diet Fluid restriction Medication management Anticipated future follow-up appointments  Patient education given on each of the above topics.  Patient acknowledges understanding and acceptance of all instructions.  I spoke with Sheri Dunn regarding her HF.  She lives in [place] alone.  She tells me that she weighs daily at home.  She did recognize that she had a weight gain however did not call because she had an appt anyway.  She also says  that she is careful with sodium and reads labels.  She does not use table salt.  She does admit that she occasionally cooks and eats sausage.  We discussed sodium content and serving size of these foods.  She acknowledges that she is careful with these types of high sodium foods and only eats them very occasionally.  She denies any issues with taking prescribed medications.  She follows with [facility].     Education Materials:  "Living Better With Heart Failure" Booklet, Daily Weight Tracker Tool.   High Risk Criteria for Readmission and/or Poor Patient Outcomes:   EF <30%-20-25 % with grade 1  2 or more admissions in 6 months- No  Difficult social situation- No  Demonstrates medication noncompliance- No    Barriers of Care:  Knowledge and compliance  Discharge Planning:   Plans to return to home alone.  She would benefit from [facility] for ongoing education, compliance reinforcement and symptom recognition.

## 2015-06-17 NOTE — Research (Signed)
ReDS @ DC Informed Consent   Subject Name: Sheri Dunn  Subject met inclusion and exclusion criteria.  The informed consent form, study requirements and expectations were reviewed with the subject and questions and concerns were addressed prior to the signing of the consent form.  The subject verbalized understanding of the trial requirements.  The subject agreed to participate in the trial and signed the informed consent.  The informed consent was obtained prior to performance of any protocol-specific procedures for the subject.  A copy of the signed informed consent was given to the subject and a copy was placed in the subject's medical record.  Blossom Hoops 06/17/2015, 4:19 PM

## 2015-06-17 NOTE — Progress Notes (Signed)
Pt A/Ox4, daughter at bedside, pt in NAD, pt states she does not want bed alarm on and will call RN if needing assistance.

## 2015-06-18 ENCOUNTER — Encounter (HOSPITAL_COMMUNITY): Payer: Self-pay | Admitting: *Deleted

## 2015-06-18 LAB — BASIC METABOLIC PANEL
Anion gap: 6 (ref 5–15)
BUN: 22 mg/dL — AB (ref 6–20)
CHLORIDE: 106 mmol/L (ref 101–111)
CO2: 28 mmol/L (ref 22–32)
CREATININE: 0.87 mg/dL (ref 0.44–1.00)
Calcium: 8.5 mg/dL — ABNORMAL LOW (ref 8.9–10.3)
GFR calc Af Amer: >60 mL/min (ref >60)
GFR calc non Af Amer: >60 mL/min (ref >60)
Glucose, Bld: 129 mg/dL — ABNORMAL HIGH (ref 65–99)
POTASSIUM: 3.6 mmol/L (ref 3.5–5.1)
Sodium: 140 mmol/L (ref 135–145)

## 2015-06-18 LAB — LIPID PANEL
CHOL/HDL RATIO: 2.7 ratio
Cholesterol: 78 mg/dL (ref 0–200)
HDL: 29 mg/dL — ABNORMAL LOW (ref >40)
LDL CALC: 42 mg/dL (ref 0–99)
TRIGLYCERIDES: 37 mg/dL (ref <150)
VLDL: 7 mg/dL (ref 0–40)
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
## 2015-06-18 NOTE — Care Management Important Message (Signed)
Important Message  Patient Details  Name: Sheri Dunn MRN: [id number] Date of Birth: 1951-05-13   Medicare Important Message Given:  Yes    Reilynn Lauro P Taylar Hartsough 06/18/2015, 3:09 PM

## 2015-06-19 LAB — BASIC METABOLIC PANEL
Anion gap: 10 (ref 5–15)
BUN: 17 mg/dL (ref 6–20)
CALCIUM: 8.7 mg/dL — AB (ref 8.9–10.3)
CHLORIDE: 104 mmol/L (ref 101–111)
CO2: 26 mmol/L (ref 22–32)
CREATININE: 0.74 mg/dL (ref 0.44–1.00)
GFR calc non Af Amer: >60 mL/min (ref >60)
GLUCOSE: 104 mg/dL — AB (ref 65–99)
Potassium: 3.7 mmol/L (ref 3.5–5.1)
Sodium: 140 mmol/L (ref 135–145)

## 2015-06-19 MED ORDER — IRBESARTAN 300 MG PO TABS
150.0000 mg | ORAL_TABLET | Freq: Every day | ORAL | Status: DC
  Administered 2015-06-20 – 2015-06-22 (×3): 150 mg via ORAL
  Filled 2015-06-19 (×3): qty 1

## 2015-06-19 NOTE — Progress Notes (Signed)
Subjective:  Feeling better. Diuresing 6.5L  Objective:  Temp:  [97.6 F (36.4 C)-97.7 F (36.5 C)] 97.6 F (36.4 C) (01/14 0800) Pulse Rate:  [65] 65 (01/13 2100) Resp:  [18] 18 (01/13 2100) BP: (103-128)/(39-88) 110/58 mmHg (01/14 1115) SpO2:  [100 %] 100 % (01/13 2100) Weight change:   Intake/Output from previous day: 01/13 0701 - 01/14 0700 In: 1190 [P.O.:1190] Out: 2850 [Urine:2850]  Intake/Output from this shift: Total I/O In: 240 [P.O.:240] Out: 1000 [Urine:1000]  Physical Exam: General appearance: alert and no distress Neck: no adenopathy, no carotid bruit, no JVD, supple, symmetrical, trachea midline and thyroid not enlarged, symmetric, no tenderness/mass/nodules Lungs: clear to auscultation bilaterally Heart: regular rate and rhythm, S1, S2 normal, no murmur, click, rub or gallop Extremities: 2+ pitting edema  Lab Results: Results for orders placed or performed during the hospital encounter of 06/16/15 (from the past 48 hour(s))  Basic metabolic panel     Status: Abnormal   Collection Time: 06/18/15  4:04 AM  Result Value Ref Range   Sodium 140 135 - 145 mmol/L   Potassium 3.6 3.5 - 5.1 mmol/L   Chloride 106 101 - 111 mmol/L   CO2 28 22 - 32 mmol/L   Glucose, Bld 129 (H) 65 - 99 mg/dL   BUN 22 (H) 6 - 20 mg/dL   Creatinine, Ser 0.87 0.44 - 1.00 mg/dL   Calcium 8.5 (L) 8.9 - 10.3 mg/dL   GFR calc non Af Amer >60 >60 mL/min   GFR calc Af Amer >60 >60 mL/min    Comment: (NOTE) The eGFR has been calculated using the CKD EPI equation. This calculation has not been validated in all clinical situations. eGFR's persistently <60 mL/min signify possible Chronic Kidney Disease.    Anion gap 6 5 - 15  Lipid panel     Status: Abnormal   Collection Time: 06/18/15  4:04 AM  Result Value Ref Range   Cholesterol 78 0 - 200 mg/dL   Triglycerides 37 <150 mg/dL   HDL 29 (L) >40 mg/dL   Total CHOL/HDL Ratio 2.7 RATIO   VLDL 7 0 - 40 mg/dL   LDL Cholesterol  42 0 - 99 mg/dL    Comment:        Total Cholesterol/HDL:CHD Risk Coronary Heart Disease Risk Table                     Men   Women  1/2 Average Risk   3.4   3.3  Average Risk       5.0   4.4  2 X Average Risk   9.6   7.1  3 X Average Risk  23.4   11.0        Use the calculated Patient Ratio above and the CHD Risk Table to determine the patient's CHD Risk.        ATP III CLASSIFICATION (LDL):  <100     mg/dL   Optimal  100-129  mg/dL   Near or Above                    Optimal  130-159  mg/dL   Borderline  160-189  mg/dL   High  >190     mg/dL   Very High   Basic metabolic panel     Status: Abnormal   Collection Time: 06/19/15  4:04 AM  Result Value Ref Range   Sodium 140 135 - 145 mmol/L   Potassium  3.7 3.5 - 5.1 mmol/L   Chloride 104 101 - 111 mmol/L   CO2 26 22 - 32 mmol/L   Glucose, Bld 104 (H) 65 - 99 mg/dL   BUN 17 6 - 20 mg/dL   Creatinine, Ser 0.74 0.44 - 1.00 mg/dL   Calcium 8.7 (L) 8.9 - 10.3 mg/dL   GFR calc non Af Amer >60 >60 mL/min   GFR calc Af Amer >60 >60 mL/min    Comment: (NOTE) The eGFR has been calculated using the CKD EPI equation. This calculation has not been validated in all clinical situations. eGFR's persistently <60 mL/min signify possible Chronic Kidney Disease.    Anion gap 10 5 - 15    Imaging: Imaging results have been reviewed  Tele- NSR  Assessment/Plan:   1. Principal Problem: 2.   Acute on chronic combined systolic and diastolic congestive heart failure, NYHA class 4 ([place]) 3. Active Problems: 4.   Atrial flutter ([place]) 5.   Hyperlipidemia 6.   Essential hypertension 7.   Cardiomyopathy, ischemic 8.   CAD (coronary artery disease) 9.   ICD (implantable cardioverter-defibrillator) in place 10.   Time Spent Directly with Patient:  20 minutes  Length of Stay:  LOS: 3 days   Continues to improve. Diuresing. I/I neg 6.5 liters. Still has 2+ pitting edema. LAbs OK. Renal stable. NSR. Dietary education. Cont IV  lasix.  Quay Burow 06/19/2015, 12:39 PM

## 2015-06-20 NOTE — Progress Notes (Signed)
    Subjective: Breathing better no PND.  LEE improved  Objective: Vital signs in last 24 hours: Temp:  [97.6 F (36.4 C)-98.9 F (37.2 C)] 97.8 F (36.6 C) (01/15 0539) Pulse Rate:  [68-76] 74 (01/15 0539) Resp:  [20-24] 24 (01/15 0539) BP: (103-133)/(39-88) 133/71 mmHg (01/15 0539) SpO2:  [97 %-100 %] 97 % (01/15 0539) Weight:  [124 lb 12.8 oz (56.609 kg)] 124 lb 12.8 oz (56.609 kg) (01/15 0539) Last BM Date: 06/18/15  Intake/Output from previous day: 01/14 0701 - 01/15 0700 In: 840 [P.O.:840] Out: 3100 [Urine:3100] Intake/Output this shift:    Medications Scheduled Meds: . aspirin EC  81 mg Oral Daily  . atorvastatin  20 mg Oral Daily  . carvedilol  12.5 mg Oral BID WC  . enoxaparin (LOVENOX) injection  40 mg Subcutaneous Q24H  . furosemide  40 mg Intravenous BID  . irbesartan  150 mg Oral Daily  . potassium chloride  10 mEq Oral Daily  . prasugrel  10 mg Oral Daily  . sodium chloride  3 mL Intravenous Q12H   Continuous Infusions:  PRN Meds:.sodium chloride, acetaminophen, ALPRAZolam, ondansetron (ZOFRAN) IV, sodium chloride, zolpidem  PE: General appearance: alert, cooperative and no distress Neck: Elevated JVD Lungs: clear to auscultation bilaterally Heart: regular rate and rhythm, S1, S2 normal, no murmur, click, rub or gallop Extremities: Trace LEE Pulses: 2+ and symmetric Skin: Warm and dry Neurologic: Grossly normal  BMET  Recent Labs  06/18/15 0404 06/19/15 0404  NA 140 140  K 3.6 3.7  CL 106 104  CO2 28 26  GLUCOSE 129* 104*  BUN 22* 17  CREATININE 0.87 0.74  CALCIUM 8.5* 8.7*   PT/INR No results for input(s): LABPROT, INR in the last 72 hours. Cholesterol  Recent Labs  06/18/15 0404  CHOL 78   Lipid Panel     Component Value Date/Time   CHOL 78 06/18/2015 0404   TRIG 37 06/18/2015 0404   HDL 29* 06/18/2015 0404   CHOLHDL 2.7 06/18/2015 0404   VLDL 7 06/18/2015 0404   LDLCALC 42 06/18/2015 0404    Assessment/Plan  Acute  on chronic combined systolic and diastolic congestive heart failure, NYHA class 4 (HCC) EF 20-25% 12/2014  Net fluids: -2.3L/-8.0L.   SCr WNL and decreased.  JVD still elevated but only a trace of LEE.  Continue IV lasix today and likely change to PO tomorrow.  On ARB, Coreg 12.5 BID   Atrial flutter (HCC)  In NSR   Hyperlipidemia  LDL 42 on lipitor 20   Essential hypertension  Controlled.    Cardiomyopathy, ischemic   CAD (coronary artery disease)   ICD (implantable cardioverter-defibrillator) in place    LOS: 4 days    HAGER, BRYAN PA-C 06/20/2015 7:48 AM    Agree with note written by Jones SkeneBryan Hagar PAC  Feeling better. Continues to diurese well (I/O neg 8L). VSS. Lungs clear. JVP slightly elevated and 1-2 + pitting edema bilat. Awaiting dietary consult. Prob change to PO lasix tomorrow and home Tuesday.  Nanetta BattyBerry, Shakthi Scipio 06/20/2015 10:43 AM

## 2015-06-21 DIAGNOSIS — I4892 Unspecified atrial flutter: Secondary | ICD-10-CM | POA: Insufficient documentation

## 2015-06-21 DIAGNOSIS — I110 Hypertensive heart disease with heart failure: Principal | ICD-10-CM

## 2015-06-21 DIAGNOSIS — I119 Hypertensive heart disease without heart failure: Secondary | ICD-10-CM

## 2015-06-21 HISTORY — DX: Unspecified atrial flutter: I48.92

## 2015-06-21 MED ORDER — FUROSEMIDE 40 MG PO TABS
40.0000 mg | ORAL_TABLET | Freq: Two times a day (BID) | ORAL | Status: DC
  Administered 2015-06-21 – 2015-06-22 (×2): 40 mg via ORAL
  Filled 2015-06-21 (×2): qty 1

## 2015-06-21 NOTE — Care Management Important Message (Signed)
Important Message  Patient Details  Name: Sheri Dunn MRN: [id number] Date of Birth: [birth year]   Medicare Important Message Given:  Yes    Oralia RudMegan P Janaya Broy 06/21/2015, 3:39 PM

## 2015-06-21 NOTE — Progress Notes (Signed)
Patient Name: Sheri CurlsCarolyn Lamountain Date of Encounter: 06/21/2015     Principal Problem:   Acute on chronic combined systolic and diastolic congestive heart failure, NYHA class 4 (HCC) Active Problems:   Cardiomyopathy, ischemic   CAD (coronary artery disease)   Hypertensive heart disease   Hyperlipidemia   ICD (implantable cardioverter-defibrillator) in place    SUBJECTIVE  Breathing much improved since admission.  Eager to go home.  CURRENT MEDS . aspirin EC  81 mg Oral Daily  . atorvastatin  20 mg Oral Daily  . carvedilol  12.5 mg Oral BID WC  . enoxaparin (LOVENOX) injection  40 mg Subcutaneous Q24H  . furosemide  40 mg Intravenous BID  . irbesartan  150 mg Oral Daily  . potassium chloride  10 mEq Oral Daily  . prasugrel  10 mg Oral Daily  . sodium chloride  3 mL Intravenous Q12H    OBJECTIVE  Filed Vitals:   06/20/15 1720 06/20/15 2034 06/21/15 0513 06/21/15 0934  BP: 128/61 116/56 134/76 123/53  Pulse:  76 79   Temp:  98.2 F (36.8 C) 98.4 F (36.9 C)   TempSrc:  Oral Oral   Resp:  18 20   Height:      Weight:   120 lb 14.4 oz (54.84 kg)   SpO2:  98% 99%     Intake/Output Summary (Last 24 hours) at 06/21/15 1141 Last data filed at 06/21/15 0944  Gross per 24 hour  Intake    600 ml  Output   2025 ml  Net  -1425 ml   Filed Weights   06/18/15 0442 06/20/15 0539 06/21/15 0513  Weight: 133 lb 8 oz (60.555 kg) 124 lb 12.8 oz (56.609 kg) 120 lb 14.4 oz (54.84 kg)    PHYSICAL EXAM  General: Pleasant, NAD. Neuro: Alert and oriented X 3. Moves all extremities spontaneously. Psych: Normal affect. HEENT:  Normal  Neck: Supple without bruits or JVD. Lungs:  Resp regular and unlabored, coarse breath sounds @ bilat bases, otw cta. Heart: RRR +++ s3, no murmurs. Abdomen: Soft, non-tender, non-distended, BS + x 4.  Extremities: No clubbing, cyanosis.  Trace right ankle edema.  No edema on left.  DP/PT/Radials 2+ and equal bilaterally.  Accessory Clinical  Findings  Basic Metabolic Panel  Recent Labs  06/19/15 0404  NA 140  K 3.7  CL 104  CO2 26  GLUCOSE 104*  BUN 17  CREATININE 0.74  CALCIUM 8.7*   TELE  rsr   Radiology/Studies  Dg Chest Port 1 View  06/17/2015  CLINICAL DATA:  A cute and chronic CHF EXAM: PORTABLE CHEST 1 VIEW COMPARISON:  PA and lateral chest x-ray of April 28, 2015 FINDINGS: The cardiopericardial silhouette remains enlarged. The pulmonary vascularity is mild prominent centrally but definite cephalization is not observed. There small bilateral pleural effusions. The permanent pacemaker defibrillator is in reasonable position. IMPRESSION: Allowing for slight decreased inflation of the lungs there has not been significant interval change. There remain small bilateral pleural effusions. Stable moderate cardiomegaly and central pulmonary vascular prominence. Electronically Signed   By: David  [place]iland[place] M.D.   On: 06/17/2015 07:48    ASSESSMENT AND PLAN  1.  Acute on chronic combined systolic/diastolic chf; ICM:  EF 20-25% by echo 12/2014.  Net neg diuresis of 9.5 L this admission with 17 lbs wt loss.  Previous estimated dry wt was 117 lbs.  She feels much better.  Switch to PO lasix today.  Prior to admission, she was taking  40 daily but was advised to double up due to volume overload.  Will change to 40 PO BID.  Cont bb/arb. Renal fxn stable.  K slightly low - would benefit from addition of spironolactone/eplerenone.  2.  CAD:  S/p PCI/DES of the mLCX and dRCA in 09/2014.  No chest pain.  Cont asa, effient, bb, statin (low dose in setting of prior LFT elevation).  3.  Hypertensive heart disease:  bp stable on bb/arb.  4.  HL:  LDL 42 on lipitor 20.  LFT's wnl.   Signed, Nicolasa Ducking NP

## 2015-06-22 LAB — BASIC METABOLIC PANEL
Anion gap: 7 (ref 5–15)
BUN: 18 mg/dL (ref 6–20)
CALCIUM: 8.7 mg/dL — AB (ref 8.9–10.3)
CHLORIDE: 106 mmol/L (ref 101–111)
CO2: 27 mmol/L (ref 22–32)
CREATININE: 0.78 mg/dL (ref 0.44–1.00)
Glucose, Bld: 81 mg/dL (ref 65–99)
Potassium: 3.7 mmol/L (ref 3.5–5.1)
SODIUM: 140 mmol/L (ref 135–145)

## 2015-06-22 MED ORDER — FUROSEMIDE 40 MG PO TABS: 40.0000 mg | ORAL_TABLET | Freq: Two times a day (BID) | ORAL | Status: DC

## 2015-06-22 NOTE — Progress Notes (Signed)
Patient Name: Sheri Dunn Date of Encounter: 06/22/2015   SUBJECTIVE  Feeling well. No chest pain, sob or palpitations.   CURRENT MEDS . aspirin EC  81 mg Oral Daily  . atorvastatin  20 mg Oral Daily  . carvedilol  12.5 mg Oral BID WC  . enoxaparin (LOVENOX) injection  40 mg Subcutaneous Q24H  . furosemide  40 mg Oral BID  . irbesartan  150 mg Oral Daily  . potassium chloride  10 mEq Oral Daily  . prasugrel  10 mg Oral Daily  . sodium chloride  3 mL Intravenous Q12H    OBJECTIVE  Filed Vitals:   06/21/15 1200 06/21/15 1646 06/21/15 2156 06/22/15 0708  BP: 118/62 126/85 118/58 130/70  Pulse: 73 79 69 72  Temp: 98.5 F (36.9 C)  97.9 F (36.6 C) 98.7 F (37.1 C)  TempSrc: Oral  Oral Oral  Resp: Height:      Weight:    119 lb 14.4 oz (54.386 kg)  SpO2: 98%  100% 100%    Intake/Output Summary (Last 24 hours) at 06/22/15 0903 Last data filed at 06/22/15 0843  Gross per 24 hour  Intake   1320 ml  Output   1650 ml  Net   -330 ml   Filed Weights   06/20/15 0539 06/21/15 0513 06/22/15 0708  Weight: 124 lb 12.8 oz (56.609 kg) 120 lb 14.4 oz (54.84 kg) 119 lb 14.4 oz (54.386 kg)    PHYSICAL EXAM  General: Pleasant, NAD. Neuro: Alert and oriented X 3. Moves all extremities spontaneously. Psych: Normal affect. HEENT:  Normal  Neck: Supple without bruits or JVD. Lungs:  Resp regular and unlabored, CTA. Heart: RRR no s3, s4, or murmurs. Abdomen: Soft, non-tender, non-distended, BS + x 4.  Extremities: No clubbing, cyanosis or edema. DP/PT/Radials 2+ and equal bilaterally.  Accessory Clinical Findings  CBC No results for input(s): WBC, NEUTROABS, HGB, HCT, MCV, PLT in the last 72 hours. Basic Metabolic Panel  Recent Labs  06/22/15 0540  NA 140  K 3.7  CL 106  CO2 27  GLUCOSE 81  BUN 18  CREATININE 0.78  CALCIUM 8.7*   TELE  NSR  Radiology/Studies  Dg Chest Port 1 View  06/17/2015  CLINICAL DATA:  A cute and chronic CHF EXAM:  PORTABLE CHEST 1 VIEW COMPARISON:  PA and lateral chest x-ray of April 28, 2015 FINDINGS: The cardiopericardial silhouette remains enlarged. The pulmonary vascularity is mild prominent centrally but definite cephalization is not observed. There small bilateral pleural effusions. The permanent pacemaker defibrillator is in reasonable position. IMPRESSION: Allowing for slight decreased inflation of the lungs there has not been significant interval change. There remain small bilateral pleural effusions. Stable moderate cardiomegaly and central pulmonary vascular prominence. Electronically Signed   By: David  [place] M.D.   On: 06/17/2015 07:48    ASSESSMENT AND PLAN   1. Acute on chronic combined systolic and diastolic congestive heart failure, NYHA class 4 (HCC) - EF 20-25% by echo 12/2014.Diuresed 39ml/9.5L. Weight down 1lb/18lb. Creatinine stable. Appears euvolemic.  - Continue coreg 12.5mg  BId, lasix  BID and Avapro  qd (at home Diovan ).   2. Cardiomyopathy, ischemic  S/p   ICD (implantable cardioverter-defibrillator) in place    3. CAD (coronary artery disease) - S/p PCI/DES of the mLCX and dRCA in 09/2014. No chest pain. Cont asa, effient, bb, statin (low dose in setting of prior LFT elevation).  4. Hypertensive heart disease - BP stable  and well controlled.   5. HL - 06/18/2015: Cholesterol 78; HDL 29*; LDL Cholesterol 42; Triglycerides 37; VLDL 7  - Continue current dose of statin  Signed, Katelind Pytel PA-C Pager [phone]

## 2015-06-22 NOTE — Progress Notes (Signed)
ReDS Vest Discharge Study  Results of ReDS reading  Your patient is in the Unblinded arm of the Vest at Discharge study.  The ReDS reading is:   ( < 39)  = 32  Your patient is ok for discharge.   Thank You   The research team    

## 2015-06-22 NOTE — Progress Notes (Signed)
Pt got discharged to home, discharge instructions provided and patient showed understanding to it, IV taken out,Telemonitor DC,pt left unit in wheelchair with all of the belongings accompanied with a family member(brother)

## 2015-06-22 NOTE — Discharge Summary (Signed)
Discharge Summary    Patient ID: Sheri Dunn,  MRN: [id number], DOB/AGE: [birth year] 65 y.o.  Admit date: 06/16/2015 Discharge date: 06/22/2015  Primary Care Provider: No PCP Per Patient Primary Cardiologist: Dr Royann Shivers   Discharge Diagnoses    Principal Problem:   Acute on chronic combined systolic and diastolic congestive heart failure, NYHA class 4 (HCC) Active Problems:   Hyperlipidemia   Cardiomyopathy, ischemic   CAD (coronary artery disease)   ICD (implantable cardioverter-defibrillator) in place   Hypertensive heart disease   Moderate aortic insufficiency    Moderate mitral insufficiency    Allergies No Known Allergies  Diagnostic Studies/Procedures    None    History of Present Illness     Sheri Dunn is a 65 y.o. female with a history of S-D-CHF, ICM, NSTEMI 09/2014 w/ DES mCFX & dRCA, EF 20-25% 12/2014, s/p MDT single chamber ICD 04/27/2015. Estimated dry weight around 117lb.   05/05/2015, seen in office and weight was 126 pounds, up 9 pounds, Lasix increased  05/18/2015, seen in office describing dyspnea on exertion and orthopnea, but stated her symptoms had improved. She walked around the office and did well, no significant hypoxia. Her weight was 130 pounds at that time. She was to continue on the increased dose of Lasix. She began tracking her weight.  Sheri Dunn presents for evaluation of shortness of breath 06/16/15. Since [name] was last seen in the office, she has gained 11 pounds. Her ability to exert herself has decreased. She reports significant daytime lower extremity edema and doesn't think she has any when she wakes up in the morning. She denies orthopnea or PND, but states that she gets short of breath all of a sudden just sitting on the couch at times. She says she can't do anything much at all without getting short of breath. She feels that she has too much fluid all over, and even her face is puffy. She has not had chest pain or  palpitations. Her ICD site is a little tender, but there is no drainage and she has had no fevers.  She admitted directed of IV diuresis. EF 20-25% by echo 12/2014.   Hospital Course     Consultants: None  She was diuresed total of  9.5L with 18lb weight loss. Discharge weight of 119lb. Her BP and creatinine were stable. There was concern regarding rate controlled atrial flutter during admission on problem list. However, no documented EKG or  telemetry strip noted. EKG and tele with sinus rhythm. No documentation on progress note as well. Consider device check during post hospital appointment for further evaluation.   The patient has been seen by Dr. Rennis Golden today and deemed ready for discharge home. All follow-up appointments have been scheduled. Discharge medications are listed below.  _____________   Discharge Vitals Blood pressure 130/70, pulse 72, temperature 98.7 F (37.1 C), temperature source Oral, resp. rate 20, height  (1.676 m), weight 119 lb 14.4 oz (54.386 kg), SpO2 100 %.  Filed Weights   06/20/15 0539 06/21/15 0513 06/22/15 0708  Weight: 124 lb 12.8 oz (56.609 kg) 120 lb 14.4 oz (54.84 kg) 119 lb 14.4 oz (54.386 kg)    Labs & Radiologic Studies     CBC No results for input(s): WBC, NEUTROABS, HGB, HCT, MCV, PLT in the last 72 hours. Basic Metabolic Panel  Recent Labs  06/22/15 0540  NA 140  K 3.7  CL 106  CO2 27  GLUCOSE 81  BUN 18  CREATININE 0.78  CALCIUM 8.7*    BNP >4500.0  Fasting Lipid Panel  06/18/2015: Cholesterol 78; HDL 29*; LDL Cholesterol 42; Triglycerides 37; VLDL 7    Dg Chest Port 1 View  06/17/2015  CLINICAL DATA:  A cute and chronic CHF EXAM: PORTABLE CHEST 1 VIEW COMPARISON:  PA and lateral chest x-ray of April 28, 2015 FINDINGS: The cardiopericardial silhouette remains enlarged. The pulmonary vascularity is mild prominent centrally but definite cephalization is not observed. There small bilateral pleural effusions. The permanent  pacemaker defibrillator is in reasonable position. IMPRESSION: Allowing for slight decreased inflation of the lungs there has not been significant interval change. There remain small bilateral pleural effusions. Stable moderate cardiomegaly and central pulmonary vascular prominence. Electronically Signed   By: David  [place] M.D.   On: 06/17/2015 07:48    Disposition   Pt is being discharged home today in good condition.  Follow-up Plans & Appointments    Follow-up Information    Follow up with HAGER, BRYAN, PA-C. Go on 07/07/2015.   Specialties:  Physician Assistant, Radiology, Interventional Cardiology   Why:  :30 for post hospital    Contact information:   [address] [place] [address] [phone]      Discharge Instructions    Diet - low sodium heart healthy    Complete by:  As directed      Discharge instructions    Complete by:  As directed   *Weigh yourself on the same scale at same time of day and keep a log. *Report weight gain of > 2 lbs in 1 day or 5 lbs over the course of a week and/or symptoms of excess fluid (shortness of breath, difficulty lying flat, swelling, poor appetite, abdominal fullness/bloating, etc) to your doctor immediately. *Avoid foods that are high in sodium (processed, pre-packaged/canned goods, fast foods, etc). *Please attend all scheduled and reccommended follow up appointments     Increase activity slowly    Complete by:  As directed            Discharge Medications   Current Discharge Medication List    CONTINUE these medications which have CHANGED   Details  furosemide (LASIX) 40 MG tablet Take 1 tablet (40 mg total) by mouth 2 (two) times daily. Qty: 60 tablet, Refills: 3      CONTINUE these medications which have NOT CHANGED   Details  aspirin 81 MG tablet Take 81 mg by mouth daily.    atorvastatin (LIPITOR) 20 MG tablet Take 1 tablet (20 mg total) by mouth daily. Qty: 30 tablet, Refills: 11   Associated  Diagnoses: Hyperlipidemia    carvedilol (COREG) 12.5 MG tablet Take 1 tablet (12.5 mg total) by mouth 2 (two) times daily with a meal. Qty: 180 tablet, Refills: 3    Cholecalciferol (VITAMIN D3) 2000 UNITS TABS Take 2,000 Units by mouth daily.    potassium chloride (K-DUR) 10 MEQ tablet Take 1 tablet (10 mEq total) by mouth daily. Qty: 90 tablet, Refills: 3    prasugrel (EFFIENT) 10 MG TABS tablet Take 1 tablet (10 mg total) by mouth daily. Qty: 30 tablet, Refills: 6    valsartan (DIOVAN) 160 MG tablet Take 160 mg by mouth daily. Refills: 0         Aspirin prescribed at discharge?  Yes High Intensity Statin Prescribed? (Lipitor 40-80mg  or Crestor 20-40mg ): No. She takes low dose in setting of prior LFT elevation Beta Blocker Prescribed? Yes For EF 45% or less, Was ACEI/ARB  Prescribed? Yes ADP Receptor Inhibitor Prescribed? (i.e. Plavix etc.-Includes Medically Managed Patients): Yes. Continue Effient For EF <40%, Aldosterone Inhibitor Prescribed? No: Will need to reassess LV EF as outpatient first.   Was EF assessed during THIS hospitalization? No: As above Was Cardiac Rehab II ordered? (Included Medically managed Patients): No: Not indicated.    Outstanding Labs/Studies  Device check. Echo per Dr. Royann Shivers.   Duration of Discharge Encounter   Greater than 30 minutes including physician time.  Signed, Shenika Quint PA-C 06/22/2015, 11:54 AM

## 2015-06-23 ENCOUNTER — Telehealth: Payer: Self-pay | Admitting: Physician Assistant

## 2015-06-23 NOTE — Telephone Encounter (Signed)
[employer] 06/23/15 2:21pm (TCM call).

## 2015-06-23 NOTE — Telephone Encounter (Signed)
D/C phone call .Marland Kitchen Appt is on 07/07/15 at 1:30pm w/ Judie Grieve hager at the [facility] office.  Thanks

## 2015-06-23 NOTE — Telephone Encounter (Signed)
Patient contacted regarding discharge from [facility] on 06/22/2015.  Patient understands to follow up with provider Wilburt Finlay, PA on 07/07/2015 at 1:30pm at the [facility] office. Patient understands discharge instructions? yes Patient understands medications and regiment? yes Patient understands to bring all medications to this visit? yes

## 2015-06-28 ENCOUNTER — Encounter: Payer: Self-pay | Admitting: Physician Assistant

## 2015-06-28 ENCOUNTER — Telehealth: Payer: Self-pay | Admitting: Internal Medicine

## 2015-06-28 NOTE — Telephone Encounter (Addendum)
Patient contacted regarding discharge from [facility] on 06/22/15.  Patient understands to follow up with provider hager on 07/07/15 at 1:30 pm at [facility]. Patient understands discharge instructions? yes  Patient understands medications and regiment? yes  Patient understands to bring all medications to this visit? yes

## 2015-06-28 NOTE — Telephone Encounter (Signed)
D/C phone call appt is on 07/07/15 at 1:30pm w/ Wilburt Finlay at the [facility] office .  Thanks

## 2015-07-07 ENCOUNTER — Encounter: Payer: Self-pay | Admitting: Physician Assistant

## 2015-07-07 ENCOUNTER — Ambulatory Visit: Payer: Medicare HMO | Admitting: Physician Assistant

## 2015-07-07 ENCOUNTER — Ambulatory Visit (INDEPENDENT_AMBULATORY_CARE_PROVIDER_SITE_OTHER): Payer: Medicare HMO | Admitting: Physician Assistant

## 2015-07-07 VITALS — BP 120/60 | HR 76 | Ht 66.0 in | Wt 128.8 lb

## 2015-07-07 DIAGNOSIS — I10 Essential (primary) hypertension: Secondary | ICD-10-CM

## 2015-07-07 DIAGNOSIS — I5042 Chronic combined systolic (congestive) and diastolic (congestive) heart failure: Secondary | ICD-10-CM

## 2015-07-07 DIAGNOSIS — I5043 Acute on chronic combined systolic (congestive) and diastolic (congestive) heart failure: Secondary | ICD-10-CM

## 2015-07-07 DIAGNOSIS — I4892 Unspecified atrial flutter: Secondary | ICD-10-CM

## 2015-07-07 DIAGNOSIS — E785 Hyperlipidemia, unspecified: Secondary | ICD-10-CM

## 2015-07-07 DIAGNOSIS — Z95810 Presence of automatic (implantable) cardiac defibrillator: Secondary | ICD-10-CM

## 2015-07-07 DIAGNOSIS — I255 Ischemic cardiomyopathy: Secondary | ICD-10-CM

## 2015-07-07 NOTE — Progress Notes (Signed)
Patient ID: Sheri Dunn, female   DOB: 11-14-1950, 65 y.o.   MRN: [id number]    Date:  07/07/2015   ID:  Sheri Dunn, DOB 09-02-1950, MRN [id number]  PCP:  No PCP Per Patient  Primary Cardiologist:  Cro[facility]  Chief Complaint  Patient presents with  . Hospitalization Follow-up    patient reports no complaints. she is concerned with taking effient and her weight.     History of Present Illness: Sheri Dunn is a 65 y.o. female  Sheri Dunn is a 65 y.o. female with a history of S-D-CHF, ICM, NSTEMI 09/2014 w/ DES mCFX & dRCA, EF 20-25% 12/2014, s/p MDT single chamber ICD 04/27/2015. Estimated dry weight around 117lb.   05/05/2015, seen in office and weight was 126 pounds, up 9 pounds, Lasix increased  05/18/2015, seen in office describing dyspnea on exertion and orthopnea, but stated her symptoms had improved. She walked around the office and did well, no significant hypoxia. Her weight was 130 pounds at that time. She was to continue on the increased dose of Lasix. She began tracking her weight.  Patient was seen on 06/16/2015 for evaluation of dyspnea. She had gained another 11 pounds. She was subsequently admitted for IV diuresis. EF 20-25% by echo 12/2014. Her discharge weight was 119 pounds.  She presents for posthospital follow-up. She reports doing well but does have some shortness of breath. She showed me her weight diary and her weight went up 5 pounds within the first 3 days of being home and has been relatively steady. Sleeping on one pillow but does have some lower extremity edema.  The patient currently denies nausea, vomiting, fever, chest pain, orthopnea, dizziness, PND, cough, congestion, abdominal pain, hematochezia, melena, claudication.  Wt Readings from Last 3 Encounters:  07/07/15 128 lb 12.8 oz (58.423 kg)  06/22/15 119 lb 14.4 oz (54.386 kg)  06/16/15 141 lb 7 oz (64.156 kg)     Past Medical History  Diagnosis Date  . Hypertension   . HLD  (hyperlipidemia)   . CAD (coronary artery disease)     a. NSTEMI 4/16:  LHC - mCFX 90, dRCA 90, EF 20% >> PCI: Synergy DES to mCFX and Synergy DES to dRCA  . Ischemic cardiomyopathy     a. Echo 4/16:  EF 25-30%, diff HK, ant-septal HK, Gr 1 DD, mod AI, mod LAE, mild RVE;  b.  Echo 7/16:  EF 20-25%, severe HK, Gr 1 DD, mod AI, mod MR, mod LAE, mod RVE, mod reduced RVSF, mild RAE  . Chronic systolic CHF (congestive heart failure) (HCC)   . Prolonged Q-T interval on ECG   . Elevated ALT measurement     Current Outpatient Prescriptions  Medication Sig Dispense Refill  . aspirin 81 MG tablet Take 81 mg by mouth daily.    Marland Kitchen atorvastatin (LIPITOR) 20 MG tablet Take 1 tablet (20 mg total) by mouth daily. 30 tablet 11  . carvedilol (COREG) 12.5 MG tablet Take 1 tablet (12.5 mg total) by mouth 2 (two) times daily with a meal. 180 tablet 3  . Cholecalciferol (VITAMIN D3) 2000 UNITS TABS Take 2,000 Units by mouth daily.    . furosemide (LASIX) 40 MG tablet Take 1 tablet (40 mg total) by mouth 2 (two) times daily. 60 tablet 3  . potassium chloride (K-DUR) 10 MEQ tablet Take 1 tablet (10 mEq total) by mouth daily. 90 tablet 3  . prasugrel (EFFIENT) 10 MG TABS tablet Take 1 tablet (10 mg total) by mouth daily.  30 tablet 6  . valsartan (DIOVAN) 160 MG tablet Take 160 mg by mouth daily.  0   No current facility-administered medications for this visit.    Allergies:   No Known Allergies  Social History:  The patient  reports that she has never smoked. She has never used smokeless tobacco. She reports that she does not drink alcohol or use illicit drugs.   Family history:   Family History  Problem Relation Age of Onset  . Heart attack Mother   . Stroke Mother   . Cystic fibrosis Sister   . Diabetes Mother     ROS:  Please see the history of present illness.  All other systems reviewed and negative.   PHYSICAL EXAM: VS:  BP 120/60 mmHg  Pulse 76  Ht  (1.676 m)  Wt 128 lb 12.8 oz (58.423  kg)  BMI 20.80 kg/m2 Well nourished, well developed, in no acute distress HEENT: Pupils are equal round react to light accommodation extraocular movements are intact.  Neck: JVD elevated to ear sitting straight up. No cervical lymphadenopathy. Cardiac: Regular rate and rhythm without murmurs rubs or gallops. Lungs:  clear to auscultation bilaterally, no wheezing, rhonchi or rales Abd: soft, nontender, positive bowel sounds all quadrants, no hepatosplenomegaly Ext: 1+ lower extremity edema.  2+ radial and dorsalis pedis pulses. Skin: warm and dry Neuro:  Grossly normal  ASSESSMENT AND PLAN:  Problem List Items Addressed This Visit    Paroxysmal atrial flutter (HCC)   Ischemic cardiomyopathy   ICD (implantable cardioverter-defibrillator) in place   Hyperlipidemia - Primary   Essential hypertension   Chronic combined systolic and diastolic CHF (congestive heart failure) (HCC)   Acute on chronic combined systolic and diastolic ACC/AHA stage C congestive heart failure (HCC)     Sheri Dunn's weight increased by about 5 pounds within the first 3 days of being home. His then fluctuated be a between 122 and 123 pounds based on her log. She does have impressive JVD while sitting straight up on the exam table. I'm increasing her Lasix to 80 mg in the morning and continuing the 40 mg in the evening for 2 days. She'll then return to 40 twice daily and continue to monitor weight. Suspect she's getting some extra sodium she tends to like to eat. She does seem conscientious about the sodium is looking at ingredients on labels. She will also take an extra potassium 10 any cues when she takes extra Lasix. For weight does not decrease by 3 or 4 pounds in the next couple of days she is to call the office. We'll otherwise see her back in a week.  Blood pressure is controlled she was also on Coreg 12 a half twice a day and Diovan 160. She's not had any chest pain.  she is on aspirin and Effient.

## 2015-07-07 NOTE — Patient Instructions (Addendum)
Your physician has recommended you make the following change in your medication:   Increase the furosemide to 80 mg in the morning and 40 mg in the evening for two (2) days. Increase the potassium to twice a day while taking the extra furosemide. After two days resume both of these medications at your regular dose.  Your physician recommends that you schedule a follow-up appointment in: next week with a PA or NP  Your physician recommends that you return for lab work in: 1 WEEK. You will not need to fast for this bloodwork.

## 2015-07-10 LAB — BASIC METABOLIC PANEL
BUN: 23 mg/dL (ref 7–25)
CHLORIDE: 101 mmol/L (ref 98–110)
CO2: 24 mmol/L (ref 20–31)
Calcium: 8.7 mg/dL (ref 8.6–10.4)
Creat: 0.91 mg/dL (ref 0.50–0.99)
GLUCOSE: 119 mg/dL — AB (ref 65–99)
POTASSIUM: 4.5 mmol/L (ref 3.5–5.3)
SODIUM: 137 mmol/L (ref 135–146)

## 2015-07-13 ENCOUNTER — Encounter: Payer: Self-pay | Admitting: Cardiology

## 2015-07-13 ENCOUNTER — Ambulatory Visit (INDEPENDENT_AMBULATORY_CARE_PROVIDER_SITE_OTHER): Payer: Medicare HMO | Admitting: Cardiology

## 2015-07-13 VITALS — BP 110/68 | HR 80 | Ht 66.0 in | Wt 128.0 lb

## 2015-07-13 DIAGNOSIS — I255 Ischemic cardiomyopathy: Secondary | ICD-10-CM | POA: Diagnosis not present

## 2015-07-13 DIAGNOSIS — Z9861 Coronary angioplasty status: Secondary | ICD-10-CM

## 2015-07-13 DIAGNOSIS — I5021 Acute systolic (congestive) heart failure: Secondary | ICD-10-CM

## 2015-07-13 DIAGNOSIS — Z95810 Presence of automatic (implantable) cardiac defibrillator: Secondary | ICD-10-CM

## 2015-07-13 DIAGNOSIS — I10 Essential (primary) hypertension: Secondary | ICD-10-CM

## 2015-07-13 DIAGNOSIS — I4892 Unspecified atrial flutter: Secondary | ICD-10-CM

## 2015-07-13 DIAGNOSIS — I2510 Atherosclerotic heart disease of native coronary artery without angina pectoris: Secondary | ICD-10-CM

## 2015-07-13 DIAGNOSIS — I5042 Chronic combined systolic (congestive) and diastolic (congestive) heart failure: Secondary | ICD-10-CM

## 2015-07-13 MED ORDER — METOLAZONE 2.5 MG PO TABS
ORAL_TABLET | ORAL | Status: DC
Start: 2015-07-13 — End: 2015-08-23

## 2015-07-13 NOTE — Assessment & Plan Note (Signed)
Controlled.  

## 2015-07-13 NOTE — Assessment & Plan Note (Signed)
No angina 

## 2015-07-13 NOTE — Progress Notes (Signed)
07/13/2015 Sheri Dunn   1951/03/04  [id number]  Primary Physician No PCP Per Patient Primary Cardiologist: Dr Royann Shivers  HPI:  65 y.o. AA female with a history of  ICM -EF 20-25% 12/2014, s/p MDT single chamber ICD 04/27/2015, and CAD- NSTEMI 09/2014 w/ DES placement to her mCFX & dRCA.  Estimated dry weight around 117lb. She was hospitalized Jan 11 after she presented to the office volume overloaded. She was discharged Jan 17 and seen by Sheri Dunn 07/07/15. Her wgts in Jan appear to be unreliable running from 140-117. She brought in a log of her home wgts which have run close to 122-124. Sheri Dunn had her increase her Lasix for a couple of doses and the pt says shew feels better but still is dyspneic with exertion.    Current Outpatient Prescriptions  Medication Sig Dispense Refill  . aspirin 81 MG tablet Take 81 mg by mouth daily.    Marland Kitchen atorvastatin (LIPITOR) 20 MG tablet Take 1 tablet (20 mg total) by mouth daily. 30 tablet 11  . carvedilol (COREG) 12.5 MG tablet Take 1 tablet (12.5 mg total) by mouth 2 (two) times daily with a meal. 180 tablet 3  . Cholecalciferol (VITAMIN D3) 2000 UNITS TABS Take 2,000 Units by mouth daily.    . furosemide (LASIX) 40 MG tablet Take 1 tablet (40 mg total) by mouth 2 (two) times daily. 60 tablet 3  . potassium chloride (K-DUR) 10 MEQ tablet Take 1 tablet (10 mEq total) by mouth daily. 90 tablet 3  . prasugrel (EFFIENT) 10 MG TABS tablet Take 1 tablet (10 mg total) by mouth daily. 30 tablet 6  . valsartan (DIOVAN) 160 MG tablet Take 160 mg by mouth daily.  0  . metolazone (ZAROXOLYN) 2.5 MG tablet One tablet every Tuesday and Friday 30 min prior to furosemide 8 tablet 6   No current facility-administered medications for this visit.    No Known Allergies  Social History   Social History  . Marital Status: Single    Spouse Name: N/A  . Number of Children: 1  . Years of Education: N/A   Occupational History  . Not on file.   Social History Main  Topics  . Smoking status: Never Smoker   . Smokeless tobacco: Never Used  . Alcohol Use: No  . Drug Use: No  . Sexual Activity: No   Other Topics Concern  . Not on file   Social History Narrative     Review of Systems: General: negative for chills, fever, night sweats or weight changes.  Cardiovascular: negative for chest pain, dyspnea on exertion, edema, orthopnea, palpitations, paroxysmal nocturnal dyspnea or shortness of breath Dermatological: negative for rash Respiratory: negative for cough or wheezing Urologic: negative for hematuria Abdominal: negative for nausea, vomiting, diarrhea, bright red blood per rectum, melena, or hematemesis Neurologic: negative for visual changes, syncope, or dizziness All other systems reviewed and are otherwise negative except as noted above.    Blood pressure 110/68, pulse 80, height  (1.676 m), weight 128 lb (58.06 kg).  General appearance: alert, cooperative, no distress and poor dentition Neck: no JVD and elelvated JVP noted Lungs: decreased at bases, no rales Heart: regular rate and rhythm Extremities: trace-1+ edema Neurologic: Grossly normal   ASSESSMENT AND PLAN:   Acute systolic CHF (congestive heart failure) F/U today from OV 07/07/15- lasix was increased for a few doses at that visit.   Ischemic cardiomyopathy Echo 7/16:  EF 20-25%, severe HK, Gr 1 DD,  mod AI, mod MR, mod LAE, mod RVE, mod reduced RVSF, mild RAE  CAD S/P PCI April 2016 No angina  ICD (implantable cardioverter-defibrillator) in place No ICD discharges per pt  Essential hypertension Controlled  Paroxysmal atrial flutter (HCC) NSR on exam   PLAN  I think she is still a little volume overloaded. Her BNP on Jan 11th was >4500. I suggested we add metolazone 2.5 mg twice a week and check a BMP and BNP in two weeks.   Sheri Dunn K PA-C 07/13/2015 1:17 PM

## 2015-07-13 NOTE — Assessment & Plan Note (Signed)
NSR on exam 

## 2015-07-13 NOTE — Assessment & Plan Note (Signed)
No ICD discharges per pt

## 2015-07-13 NOTE — Patient Instructions (Signed)
Medication Instructions:   START METOLAZONE 2.5 MG TAKE ONE TABLET EVERY Tuesday AND Friday 30 MIN PRIOR TO FUROSEMIDE   Labwork:  Your physician recommends that you return for lab work in: 2 WEEKS  Follow-Up:  Your physician recommends that you schedule a follow-up appointment in: 4-6 WEEKS WITH DR Royann Shivers

## 2015-07-13 NOTE — Assessment & Plan Note (Signed)
F/U today from OV 07/07/15- lasix was increased for a few doses at that visit.

## 2015-07-13 NOTE — Assessment & Plan Note (Signed)
Echo 7/16:  EF 20-25%, severe HK, Gr 1 DD, mod AI, mod MR, mod LAE, mod RVE, mod reduced RVSF, mild RAE

## 2015-07-26 ENCOUNTER — Telehealth: Payer: Self-pay | Admitting: Cardiovascular Disease

## 2015-07-26 NOTE — Telephone Encounter (Signed)
Have tried a dozen times to explain the difference between excess water weight and true weight. Will have to keep working with her. Her dry weight is probably <117 lb.

## 2015-07-26 NOTE — Telephone Encounter (Signed)
Pt said [facility] put her on Metolazone,she can not take this medicine.She just keep losing weight,she now weighs 114.

## 2015-07-26 NOTE — Telephone Encounter (Signed)
Patient is taking metolazone as prescribed since prescribed on 2/7 Patient states she was 128lbs on 2/7 and is now 114lbs  - lost 3lbs between 2/18 and 2/19 Patient states she is skin and bones Patient was ordered BMET & BNP to be done 2 weeks after visit with Franky Macho, [place] - advised patient have labs today Explained that metolazone was ordered not due to weight gain but that her BNP was elevated and the labs will reassess that number.  She agreed to plan.   Message routed to [place], [place] & Dr. Salena Saner as Lorain Childes

## 2015-07-27 ENCOUNTER — Other Ambulatory Visit: Payer: Self-pay | Admitting: Cardiology

## 2015-07-27 DIAGNOSIS — I5022 Chronic systolic (congestive) heart failure: Secondary | ICD-10-CM

## 2015-07-27 LAB — BASIC METABOLIC PANEL
BUN: 27 mg/dL — ABNORMAL HIGH (ref 7–25)
CO2: 28 mmol/L (ref 20–31)
Calcium: 9.2 mg/dL (ref 8.6–10.4)
Chloride: 99 mmol/L (ref 98–110)
Creat: 0.99 mg/dL (ref 0.50–0.99)
Glucose, Bld: 109 mg/dL — ABNORMAL HIGH (ref 65–99)
Potassium: 4.2 mmol/L (ref 3.5–5.3)
Sodium: 138 mmol/L (ref 135–146)

## 2015-07-27 LAB — BRAIN NATRIURETIC PEPTIDE: Brain Natriuretic Peptide: 4106.6 pg/mL — ABNORMAL HIGH (ref <100)

## 2015-08-12 ENCOUNTER — Telehealth: Payer: Self-pay | Admitting: *Deleted

## 2015-08-12 NOTE — Telephone Encounter (Signed)
Patient notified of labs that are needed to be drawn. She will come for this.

## 2015-08-12 NOTE — Telephone Encounter (Signed)
-----   Message from Daphine Deutscheregina N Jamelle Goldston, RN sent at 04/15/2015  3:48 PM EST ----- Call and remind due for LFT for SA on 08/16/15. Lab in..Marland Kitchen

## 2015-08-16 ENCOUNTER — Other Ambulatory Visit (INDEPENDENT_AMBULATORY_CARE_PROVIDER_SITE_OTHER): Payer: Medicare HMO

## 2015-08-16 DIAGNOSIS — R7989 Other specified abnormal findings of blood chemistry: Secondary | ICD-10-CM | POA: Diagnosis not present

## 2015-08-16 DIAGNOSIS — R945 Abnormal results of liver function studies: Secondary | ICD-10-CM

## 2015-08-16 LAB — HEPATIC FUNCTION PANEL
ALK PHOS: 184 U/L — AB (ref 39–117)
ALT: 21 U/L (ref 0–35)
AST: 22 U/L (ref 0–37)
Albumin: 3.8 g/dL (ref 3.5–5.2)
Bilirubin, Direct: 0.3 mg/dL (ref 0.0–0.3)
TOTAL PROTEIN: 6.9 g/dL (ref 6.0–8.3)
Total Bilirubin: 0.9 mg/dL (ref 0.2–1.2)

## 2015-08-18 ENCOUNTER — Other Ambulatory Visit: Payer: Self-pay | Admitting: *Deleted

## 2015-08-18 DIAGNOSIS — R748 Abnormal levels of other serum enzymes: Secondary | ICD-10-CM

## 2015-08-19 ENCOUNTER — Other Ambulatory Visit: Payer: Self-pay | Admitting: *Deleted

## 2015-08-19 ENCOUNTER — Telehealth: Payer: Self-pay | Admitting: Gastroenterology

## 2015-08-19 ENCOUNTER — Other Ambulatory Visit: Payer: Medicare HMO

## 2015-08-19 DIAGNOSIS — R945 Abnormal results of liver function studies: Principal | ICD-10-CM

## 2015-08-19 DIAGNOSIS — R7989 Other specified abnormal findings of blood chemistry: Secondary | ICD-10-CM

## 2015-08-19 DIAGNOSIS — R748 Abnormal levels of other serum enzymes: Secondary | ICD-10-CM

## 2015-08-19 NOTE — Telephone Encounter (Signed)
Found the correct lab, listed as IgG 4 (with space). I ordered it if you can have the patient come in for lab draw. Thanks

## 2015-08-19 NOTE — Telephone Encounter (Signed)
Patient will come for labs.  

## 2015-08-20 LAB — IGG 4: IGG 4: 42 mg/dL (ref 1–291)

## 2015-08-23 ENCOUNTER — Encounter: Payer: Self-pay | Admitting: Cardiovascular Disease

## 2015-08-23 ENCOUNTER — Ambulatory Visit (INDEPENDENT_AMBULATORY_CARE_PROVIDER_SITE_OTHER): Payer: Medicare HMO | Admitting: Cardiovascular Disease

## 2015-08-23 ENCOUNTER — Other Ambulatory Visit: Payer: Self-pay | Admitting: *Deleted

## 2015-08-23 VITALS — BP 117/69 | HR 71 | Ht 66.0 in | Wt 124.2 lb

## 2015-08-23 DIAGNOSIS — R748 Abnormal levels of other serum enzymes: Secondary | ICD-10-CM

## 2015-08-23 DIAGNOSIS — I255 Ischemic cardiomyopathy: Secondary | ICD-10-CM

## 2015-08-23 DIAGNOSIS — I4892 Unspecified atrial flutter: Secondary | ICD-10-CM

## 2015-08-23 DIAGNOSIS — Z9861 Coronary angioplasty status: Secondary | ICD-10-CM

## 2015-08-23 DIAGNOSIS — Z95810 Presence of automatic (implantable) cardiac defibrillator: Secondary | ICD-10-CM

## 2015-08-23 DIAGNOSIS — I5042 Chronic combined systolic (congestive) and diastolic (congestive) heart failure: Secondary | ICD-10-CM | POA: Diagnosis not present

## 2015-08-23 DIAGNOSIS — I2510 Atherosclerotic heart disease of native coronary artery without angina pectoris: Secondary | ICD-10-CM

## 2015-08-23 DIAGNOSIS — I509 Heart failure, unspecified: Secondary | ICD-10-CM

## 2015-08-23 DIAGNOSIS — I10 Essential (primary) hypertension: Secondary | ICD-10-CM | POA: Diagnosis not present

## 2015-08-23 DIAGNOSIS — I351 Nonrheumatic aortic (valve) insufficiency: Secondary | ICD-10-CM

## 2015-08-23 NOTE — Patient Instructions (Signed)
Dr Royann Shiversroitoru has recommended making the following medication changes: 1. STOP Effient when you finish your current bottle  Dr Royann Shiversroitoru recommends that you schedule a follow-up appointment in 3 months.  If you need a refill on your cardiac medications before your next appointment, please call your pharmacy.

## 2015-08-23 NOTE — Progress Notes (Addendum)
Patient ID: Sheri Dunn, female   DOB: Nov 25, 1950, 65 y.o.   MRN: [id number]    Cardiology Office Note    Date:  08/25/2015   ID:  Sheri Dunn, DOB 1950/11/19, MRN [id number]  PCP:  No PCP Per Patient  Cardiologist:   Thurmon Fair, MD   Chief Complaint  Patient presents with  . Follow-up    4-6 weeks per Corine Shelter, PA-C  MDT-BLUE  pt c/o SOB on exertion and occasionally at rest, Fatigue, no other Sx.    History of Present Illness:  Sheri Dunn is a 65 y.o. female with severe ischemic cardiomyopathy, chronic systolic and diastolic biventricular HF, CAD s/p DES to RCA and LCX approximately 1 year ago, s/p ICD 5 months ago (Medtronic Visia AF).  She has NYHA class 2 dyspnea, mild ankle edema, occasional palpitations, but does not have angina, orthopnea, supine cough (previously a prominent HF symptom for her) syncope or ICD shock. Her ICD site is well healed. She is roughly 6 lb above our previous estimated "dry weight".  Her ICD shows normal battery and lead parameters and no AF or VT. The Corvue (recently matured after implantation) shows a recent marked reduction in thoracic impedance. V pacing is <0.1%. Generator longevity estimated at 11 years. Activity 1.3 h/day is slowly increasing.  Past Medical History  Diagnosis Date  . Hypertension   . HLD (hyperlipidemia)   . CAD (coronary artery disease)     a. NSTEMI 4/16:  LHC - mCFX 90, dRCA 90, EF 20% >> PCI: Synergy DES to mCFX and Synergy DES to dRCA  . Ischemic cardiomyopathy     a. Echo 4/16:  EF 25-30%, diff HK, ant-septal HK, Gr 1 DD, mod AI, mod LAE, mild RVE;  b.  Echo 7/16:  EF 20-25%, severe HK, Gr 1 DD, mod AI, mod MR, mod LAE, mod RVE, mod reduced RVSF, mild RAE  . Chronic systolic CHF (congestive heart failure) (HCC)   . Prolonged Q-T interval on ECG   . Elevated ALT measurement     Past Surgical History  Procedure Laterality Date  . Left heart catheterization with coronary angiogram N/A 09/08/2014   Procedure: LEFT HEART CATHETERIZATION WITH CORONARY ANGIOGRAM;  Surgeon: Corky Crafts, MD;  Location: [facility] CATH LAB;  Service: Cardiovascular;  Laterality: N/A;  . Cardiac catheterization Right 09/08/2014    Procedure: CORONARY STENT INTERVENTION;  Surgeon: Corky Crafts, MD;  Location: [facility] CATH LAB;  Service: Cardiovascular;  Laterality: Right;  . Ep implantable device N/A 04/27/2015    Procedure: ICD Implant;  Surgeon: Thurmon Fair, MD; Medtronic Visia AF, Model [id number] serial number [id number]     Outpatient Prescriptions Prior to Visit  Medication Sig Dispense Refill  . aspirin 81 MG tablet Take 81 mg by mouth daily.    Marland Kitchen atorvastatin (LIPITOR) 20 MG tablet Take 1 tablet (20 mg total) by mouth daily. 30 tablet 11  . carvedilol (COREG) 12.5 MG tablet Take 1 tablet (12.5 mg total) by mouth 2 (two) times daily with a meal. 180 tablet 3  . Cholecalciferol (VITAMIN D3) 2000 UNITS TABS Take 2,000 Units by mouth daily.    . furosemide (LASIX) 40 MG tablet Take 1 tablet (40 mg total) by mouth 2 (two) times daily. 60 tablet 3  . potassium chloride (K-DUR) 10 MEQ tablet Take 1 tablet (10 mEq total) by mouth daily. 90 tablet 3  . valsartan (DIOVAN) 160 MG tablet Take 160 mg by mouth daily.  0  . prasugrel (EFFIENT) 10  MG TABS tablet Take 1 tablet (10 mg total) by mouth daily. 30 tablet 6  . metolazone (ZAROXOLYN) 2.5 MG tablet One tablet every Tuesday and Friday 30 min prior to furosemide 8 tablet 6   No facility-administered medications prior to visit.     Allergies:   Review of patient's allergies indicates no known allergies.   Social History   Social History  . Marital Status: Single    Spouse Name: N/A  . Number of Children: 1  . Years of Education: N/A   Social History Main Topics  . Smoking status: Never Smoker   . Smokeless tobacco: Never Used  . Alcohol Use: No  . Drug Use: No  . Sexual Activity: No   Other Topics Concern  . None   Social History Narrative      Family History:  The patient's family history includes Cystic fibrosis in her sister; Diabetes in her mother; Heart attack in her mother; Stroke in her mother.   ROS:   Please see the history of present illness.    ROS All other systems reviewed and are negative.   PHYSICAL EXAM:   VS:  BP 117/69 mmHg  Pulse 71  Ht 5\' 6"  (1.676 m)  Wt 56.337 kg (124 lb 3.2 oz)  BMI 20.06 kg/m2   GEN: Well nourished, well developed, in no acute distress HEENT: normal Neck: 8-9 cm JVD, no carotid bruits, or masses Cardiac: RRR; no murmurs or rubs, S3 gallop present,1+ ankle bilateral edema  Respiratory:  clear to auscultation bilaterally, normal work of breathing GI: soft, nontender, nondistended, + BS MS: no deformity or atrophy Skin: warm and dry, no rash Neuro:  Alert and Oriented x 3, Strength and sensation are intact Psych: euthymic mood, full affect  Wt Readings from Last 3 Encounters:  08/23/15 56.337 kg (124 lb 3.2 oz)  07/13/15 58.06 kg (128 lb)  07/07/15 58.423 kg (128 lb 12.8 oz)      Studies/Labs Reviewed:   EKG:  EKG is not ordered today.   Recent Labs: 09/05/2014: TSH 1.940 06/16/2015: B Natriuretic Peptide >4500.0*; Hemoglobin 13.9; Magnesium 2.3; Platelets 172 07/26/2015: BUN 27*; Creat 0.99; Potassium 4.2; Sodium 138 08/16/2015: ALT 21   Lipid Panel    Component Value Date/Time   CHOL 78 06/18/2015 0404   TRIG 37 06/18/2015 0404   HDL 29* 06/18/2015 0404   CHOLHDL 2.7 06/18/2015 0404   VLDL 7 06/18/2015 0404   LDLCALC 42 06/18/2015 0404      ASSESSMENT:    1. Chronic combined systolic and diastolic CHF (congestive heart failure) (HCC)   2. Paroxysmal atrial flutter (HCC)   3. CAD S/P PCI April 2016   4. Essential hypertension   5. ICD (implantable cardioverter-defibrillator) in place      PLAN:  In order of problems listed above:  1. CHF: mildly hypervolemic by exam and Corvue. Instructed to double loop diuretic dose if she gains 2 more lb or greater  from today. Reviewed sodium restriction and daily weights again. She is on maximum tolerated doses of carvedilol and ARB. 2. No recent atrial or ventricular arrhythmia. Will reconsider need for anticoagulation based on arrhythmia detected by ICD 3. CAD: asymptomatic - stop Effient when current bottle finished 4. HTN: good BP control 5. AICD: normal device function. Consider enrolling in remote HF monitoring 6. AI: review echo yearly    Medication Adjustments/Labs and Tests Ordered: Current medicines are reviewed at length with the patient today.  Concerns regarding medicines are outlined  above.  Medication changes, Labs and Tests ordered today are listed in the Patient Instructions below. Patient Instructions  Dr Royann Shivers has recommended making the following medication changes: 1. STOP Effient when you finish your current bottle  Dr Royann Shivers recommends that you schedule a follow-up appointment in 3 months.  If you need a refill on your cardiac medications before your next appointment, please call your pharmacy.      Joie Bimler, MD  08/25/2015 7:17 PM    [facility] [address] [place], [place], [place]  [address] Phone: [phone]; Fax: [phone]

## 2015-08-24 ENCOUNTER — Telehealth: Payer: Self-pay

## 2015-08-24 ENCOUNTER — Encounter: Payer: Self-pay | Admitting: *Deleted

## 2015-08-24 NOTE — Telephone Encounter (Signed)
Yes, she can use the lidocaine patches. Please call if weight exceeds 125 lb on home scale.  Thank you, MCr

## 2015-08-24 NOTE — Telephone Encounter (Signed)
Called patient.  Patient aware of final device report.   Patient just woke up. Patient went and weighed while on the phone. She weighed 123.4lbs per her scale today. That is down from her office weight yesterday.   She does keep a record of her daily weights. She weighed 122.6lbs (per her scale) yesterday morning.   I reminded her to mindful of her salt intake.   She complained of having some lower back trouble and had purchased Lidocaine 5% patches. She wants to know if its okay for her to use them.

## 2015-08-24 NOTE — Telephone Encounter (Signed)
-----   Message from Neta EhlersAngela M Truitt, CMA sent at 08/23/2015  4:58 PM EDT ----- Call patient.  Got final report back on device and you have a little bit more fluid.  Have you weighed today?  If you gain more that 2 pounds from today's (08/23/15) weight, MC may increase fluid.  Keep a record of your daily weights. Limit salt intake.

## 2015-08-26 NOTE — Telephone Encounter (Signed)
Called patient with Dr Renaye Rakers's recommendations. Patient understood and agreed with plan.

## 2015-08-27 ENCOUNTER — Ambulatory Visit (HOSPITAL_COMMUNITY)
Admission: RE | Admit: 2015-08-27 | Discharge: 2015-08-27 | Disposition: A | Discharge status: Home / Self Care | Payer: Medicare HMO | Source: Ambulatory Visit | Attending: Gastroenterology | Admitting: Gastroenterology

## 2015-08-27 ENCOUNTER — Encounter (HOSPITAL_COMMUNITY): Payer: Self-pay

## 2015-08-27 DIAGNOSIS — R748 Abnormal levels of other serum enzymes: Secondary | ICD-10-CM | POA: Insufficient documentation

## 2015-08-27 DIAGNOSIS — R5383 Other fatigue: Secondary | ICD-10-CM | POA: Insufficient documentation

## 2015-08-27 MED ORDER — IOPAMIDOL (ISOVUE-300) INJECTION 61%
100.0000 mL | Freq: Once | INTRAVENOUS | Status: AC | PRN
  Administered 2015-08-27: 100 mL via INTRAVENOUS

## 2015-08-30 ENCOUNTER — Other Ambulatory Visit: Payer: Self-pay | Admitting: *Deleted

## 2015-08-30 DIAGNOSIS — R748 Abnormal levels of other serum enzymes: Secondary | ICD-10-CM

## 2015-09-14 ENCOUNTER — Telehealth: Payer: Self-pay | Admitting: *Deleted

## 2015-09-14 NOTE — Telephone Encounter (Signed)
I called patient for 5036-month phone call for [email or web address]  Study. Patient has not been re-hospitalized in last 3 months. I thanked patient for her participation in the study.

## 2015-09-27 ENCOUNTER — Telehealth: Payer: Self-pay | Admitting: Cardiovascular Disease

## 2015-09-27 NOTE — Telephone Encounter (Signed)
Clarified for patient that the medication is twice daily. Inquired as to whether she had received different instructions recently or needed refill. Pt states no - she just wanted to make sure frequency unchanged.  Advised to continue at current dosing and call if BP deviation or other concerns. Pt voiced thanks and understanding of instructions.

## 2015-09-27 NOTE — Telephone Encounter (Signed)
New Message  Pt c/o medication issue: 1. Name of Medication: carvedilol (COREG) 12.5 MG tablet  4. What is your medication issue? Pt req a call back to discuss if she should continue to take two pills a day or one pill per day/ Please call back to clarify.

## 2015-09-29 ENCOUNTER — Telehealth: Payer: Self-pay | Admitting: Cardiovascular Disease

## 2015-09-29 NOTE — Telephone Encounter (Signed)
LEFT MESSAGE TO CALL BACK IN REGARDS TO PREVIOUS MESSAGE  WILL LET PATIENT KNOW IF WEIGHT IS STILL AT 125 LB  CONTINUE TO MONITOR, IF ABOVE  125 LBS CALL OFFICE BACK

## 2015-09-29 NOTE — Telephone Encounter (Signed)
Mrs. Sheri Dunn is calling because she was told that once her weight gets to 125 to call the office . Please call   Thanks

## 2015-10-01 NOTE — Telephone Encounter (Signed)
Received msg to call patient. Returned her call. She reports wt at 125 lbs. No discernable swelling on ankles, etc. She reports no fatigue, no SOB. Thinks her weight is up a little bit due to eating more. She is not concerned w/ this, but had been told to report if she had weight increase.  Currently on lasix 40mg  BID. I recommended she call if further gain, esp w/ any LE swelling, SOB, fatigue, etc -- May be advised at that time to take extra lasix.  Advised no action for now.

## 2015-10-22 DIAGNOSIS — I255 Ischemic cardiomyopathy: Secondary | ICD-10-CM | POA: Diagnosis not present

## 2015-10-22 DIAGNOSIS — R945 Abnormal results of liver function studies: Secondary | ICD-10-CM | POA: Diagnosis not present

## 2015-10-22 DIAGNOSIS — R5383 Other fatigue: Secondary | ICD-10-CM | POA: Diagnosis not present

## 2015-10-22 DIAGNOSIS — I509 Heart failure, unspecified: Secondary | ICD-10-CM | POA: Diagnosis not present

## 2015-10-22 DIAGNOSIS — K4090 Unilateral inguinal hernia, without obstruction or gangrene, not specified as recurrent: Secondary | ICD-10-CM | POA: Diagnosis not present

## 2015-10-22 DIAGNOSIS — R739 Hyperglycemia, unspecified: Secondary | ICD-10-CM | POA: Diagnosis not present

## 2015-11-03 DIAGNOSIS — H524 Presbyopia: Secondary | ICD-10-CM | POA: Diagnosis not present

## 2015-11-03 DIAGNOSIS — H5210 Myopia, unspecified eye: Secondary | ICD-10-CM | POA: Diagnosis not present

## 2015-11-17 ENCOUNTER — Encounter: Payer: Self-pay | Admitting: Cardiovascular Disease

## 2015-11-17 ENCOUNTER — Telehealth: Payer: Self-pay

## 2015-11-17 ENCOUNTER — Ambulatory Visit (INDEPENDENT_AMBULATORY_CARE_PROVIDER_SITE_OTHER): Payer: Commercial Managed Care - HMO | Admitting: Cardiovascular Disease

## 2015-11-17 VITALS — BP 115/73 | HR 70 | Ht 66.0 in | Wt 137.6 lb

## 2015-11-17 DIAGNOSIS — Z95810 Presence of automatic (implantable) cardiac defibrillator: Secondary | ICD-10-CM

## 2015-11-17 DIAGNOSIS — I4892 Unspecified atrial flutter: Secondary | ICD-10-CM | POA: Diagnosis not present

## 2015-11-17 DIAGNOSIS — I2510 Atherosclerotic heart disease of native coronary artery without angina pectoris: Secondary | ICD-10-CM

## 2015-11-17 DIAGNOSIS — I110 Hypertensive heart disease with heart failure: Secondary | ICD-10-CM

## 2015-11-17 DIAGNOSIS — I5043 Acute on chronic combined systolic (congestive) and diastolic (congestive) heart failure: Secondary | ICD-10-CM | POA: Diagnosis not present

## 2015-11-17 DIAGNOSIS — Z9861 Coronary angioplasty status: Secondary | ICD-10-CM

## 2015-11-17 DIAGNOSIS — I5042 Chronic combined systolic (congestive) and diastolic (congestive) heart failure: Secondary | ICD-10-CM

## 2015-11-17 DIAGNOSIS — I351 Nonrheumatic aortic (valve) insufficiency: Secondary | ICD-10-CM

## 2015-11-17 MED ORDER — METOLAZONE 2.5 MG PO TABS: 2.5000 mg | ORAL_TABLET | ORAL | Status: AC

## 2015-11-17 MED ORDER — POTASSIUM CHLORIDE ER 10 MEQ PO TBCR: 10.0000 meq | EXTENDED_RELEASE_TABLET | Freq: Two times a day (BID) | ORAL | Status: DC

## 2015-11-17 NOTE — Telephone Encounter (Signed)
Received ICM clinic referral from Dr Royann Shiversroitoru.  Call to patient and provide ICM intro and reason for call.  [facility]  Reviewed instructions given to her today at the office visit with Dr Royann Shiversroitoru.  Advised to weigh daily to check if she losing fluid weight to get back to her baseline.  Per office note, patient has gained 14 lbs.  She reported she has fluid in her belly.  Provide number and encouraged her to call if needed or if urgent, call 911.  1st ICM remote transmission scheduled for 11/24/2015 which is day before office follow up appointment with Theodore Demarkhonda Barrett, PA.   Patient verbalized instructions given to her today by Dr Royann Shiversroitoru.

## 2015-11-17 NOTE — Patient Instructions (Signed)
Medication Instructions: Dr Royann Shiversroitoru has recommended making the following medication changes: 1. START Metolazone 2.5 mg - take 1 tablet 30 MINUTES PRIOR TO LASIX on Mondays, Wednesdays, and Fridays 2. START Mag-Ox 400 mg - take 1 tablet by mouth daily 3. INCREASE Potassium to 1 tablet TWICE daily  Labwork: Your physician recommends that you return for lab work next week.  Testing/Procedures: NONE ORDERED  Follow-up: Dr Royann Shiversroitoru recommends that you schedule a follow-up appointment in 1 week.  If you need a refill on your cardiac medications before your next appointment, please call your pharmacy.

## 2015-11-17 NOTE — Progress Notes (Signed)
Patient ID: Sheri Dunn, female   DOB: April 23, 1951, 65 y.o.   MRN: [id number]    Cardiology Office Note    Date:  11/17/2015   ID:  Sheri Dunn, DOB 11/25/1950, MRN [id number]  PCP:  Katy Apo, MD  Cardiologist:   Thurmon Fair, MD   Chief Complaint  Patient presents with  . Follow-up    pt c/o lack of energy  . Pacemaker Check    History of Present Illness:  Sheri Dunn is a 65 y.o. female with severe ischemic cardiomyopathy, chronic systolic and diastolic biventricular HF, CAD s/p DES to RCA and LCX approximately 1 year ago, s/p ICD 5 months ago (Medtronic Visia AF).  She presents with complaints of abdominal distention, discomfort and early satiety. She says that after she eats she feels immediately full. She believes this is related to her hernia and the food getting stuck. She had a CT scan of the abdomen and pelvis performed in March that showed bilateral inguinal hernias. On the right side or near as larger and contains intestine, but neither side showed evidence of strangulation or incarceration.  She has NYHA class 2 dyspnea, 3+ pretibial and ankle edema, occasional palpitations, but does not have angina, orthopnea, supine cough (previously a prominent HF symptom for her) syncope or ICD shock. Her ICD site is well healed. She is roughly 20 lb above our previous estimated "dry weight".  Her ICD shows normal battery and lead parameters and no AF or VT. The Corvue shows evidence of marked reduction in thoracic impedance (fluid overload) since late March. Ventricular arrhythmia And atrial fibrillation have not been detected. V pacing is <0.1%. Generator longevity estimated at 11 years.   Reports compliance with her sodium restriction and diuretic therapy. She hates the large potassium pills.  She initially presented with heart disease in April 2016 when she had a non-ST segment elevation myocardial infarction. She received a drug-eluting stent to the mid circumflex  coronary artery and another drug-eluting stent to distal right coronary artery. Ejection fraction at that time was 20% by angiography, 25-30% by echocardiographyand failed to improve on medical therapy (still 20-25% on echo July 2016). She has a relatively narrow QRS complex on ECG. She received a Medtronic Visia AF defibrillator in November 2016.  Past Medical History  Diagnosis Date  . Hypertension   . HLD (hyperlipidemia)   . CAD (coronary artery disease)     a. NSTEMI 4/16:  LHC - mCFX 90, dRCA 90, EF 20% >> PCI: Synergy DES to mCFX and Synergy DES to dRCA  . Ischemic cardiomyopathy     a. Echo 4/16:  EF 25-30%, diff HK, ant-septal HK, Gr 1 DD, mod AI, mod LAE, mild RVE;  b.  Echo 7/16:  EF 20-25%, severe HK, Gr 1 DD, mod AI, mod MR, mod LAE, mod RVE, mod reduced RVSF, mild RAE  . Chronic systolic CHF (congestive heart failure) (HCC)   . Prolonged Q-T interval on ECG   . Elevated ALT measurement     Past Surgical History  Procedure Laterality Date  . Left heart catheterization with coronary angiogram N/A 09/08/2014    Procedure: LEFT HEART CATHETERIZATION WITH CORONARY ANGIOGRAM;  Surgeon: Corky Crafts, MD;  [facility]: [facility] CATH LAB;  Service: Cardiovascular;  Laterality: N/A;  . Cardiac catheterization Right 09/08/2014    Procedure: CORONARY STENT INTERVENTION;  Surgeon: Corky Crafts, MD;  Location: [facility] CATH LAB;  Service: Cardiovascular;  Laterality: Right;  . Ep implantable device N/A 04/27/2015  Procedure: ICD Implant;  Surgeon: Thurmon FairMihai Jahki Witham, MD; Medtronic Visia AF, Model [id number] serial number [id number]     Outpatient Prescriptions Prior to Visit  Medication Sig Dispense Refill  . aspirin 81 MG tablet Take 81 mg by mouth daily.    Marland Kitchen. atorvastatin (LIPITOR) 20 MG tablet Take 1 tablet (20 mg total) by mouth daily. 30 tablet 11  . carvedilol (COREG) 12.5 MG tablet Take 1 tablet (12.5 mg total) by mouth 2 (two) times daily with a meal. 180 tablet 3  . Cholecalciferol  (VITAMIN D3) 2000 UNITS TABS Take 2,000 Units by mouth daily.    . furosemide (LASIX) 40 MG tablet Take 1 tablet (40 mg total) by mouth 2 (two) times daily. 60 tablet 3  . valsartan (DIOVAN) 160 MG tablet Take 160 mg by mouth daily.  0  . potassium chloride (K-DUR) 10 MEQ tablet Take 1 tablet (10 mEq total) by mouth daily. 90 tablet 3   No facility-administered medications prior to visit.     Allergies:   Review of patient's allergies indicates no known allergies.   Social History   Social History  . Marital Status: Single    Spouse Name: N/A  . Number of Children: 1  . Years of Education: N/A   Social History Main Topics  . Smoking status: Never Smoker   . Smokeless tobacco: Never Used  . Alcohol Use: No  . Drug Use: No  . Sexual Activity: No   Other Topics Concern  . None   Social History Narrative     Family History:  The patient's family history includes Cystic fibrosis in her sister; Diabetes in her mother; Heart attack in her mother; Stroke in her mother.   ROS:   Please see the history of present illness.    ROS All other systems reviewed and are negative.   PHYSICAL EXAM:   VS:  BP 115/73 mmHg  Pulse 70  Ht 5\' 6"  (1.676 m)  Wt 62.415 kg (137 lb 9.6 oz)  BMI 22.22 kg/m2  SpO2 97%   GEN: Well nourished, well developed, in no acute distress HEENT: normal Neck: >12 cm JVD to the angle of the jaw, no carotid bruits, or masses Cardiac: RRR; no murmurs or rubs, S3 gallop present,3+ ankle and pretibial bilateral edema  Respiratory:  clear to auscultation bilaterally, normal work of breathing GI: soft, nontender, mildly distended, marked hepatomegaly 6-7 centimeters believe the costal margin, no evidence of strangulated or incarcerated right inguinal hernia, normal BS MS: no deformity or atrophy Skin: warm and dry, no rash Neuro:  Alert and Oriented x 3, Strength and sensation are intact Psych: euthymic mood, full affect  Wt Readings from Last 3 Encounters:    11/17/15 62.415 kg (137 lb 9.6 oz)  08/23/15 56.337 kg (124 lb 3.2 oz)  07/13/15 58.06 kg (128 lb)      Studies/Labs Reviewed:   EKG:  EKG is ordered today.  It shows sinus rhythm with a single PAC conducted with aberrancy, mild intraventricular conduction delay (QRS 104 ms), chronic inferolateral ST ST depression and T-wave inversion, QTC 479 ms (was 462 ms on the previous EKG) Recent Labs: 06/16/2015: Hemoglobin 13.9; Magnesium 2.3; Platelets 172 07/26/2015: Brain Natriuretic Peptide 4106.6*; BUN 27*; Creat 0.99; Potassium 4.2; Sodium 138 08/16/2015: ALT 21   Lipid Panel    Component Value Date/Time   CHOL 78 06/18/2015 0404   TRIG 37 06/18/2015 0404   HDL 29* 06/18/2015 0404   CHOLHDL 2.7 06/18/2015 0404  VLDL 7 06/18/2015 0404   LDLCALC 42 06/18/2015 0404      ASSESSMENT:    1. Acute on chronic combined systolic and diastolic ACC/AHA stage C congestive heart failure (HCC)   2. Paroxysmal atrial flutter (HCC)   3. CAD S/P PCI April 2016   4. Hypertensive heart disease with heart failure (HCC)   5. ICD (implantable cardioverter-defibrillator) in place   6. Aortic insufficiency      PLAN:  In order of problems listed above:  1. CHF: Severely hypervolemic by exam and Corvue. She was instructed to call if she gains more than 2 pounds over her previous weight, but failed to do so even though she gained 14 more pounds. I don't think we can rely on her for daily weight monitoring as a way to prevent heart failure exacerbation. We'll enroll her in the heart failure device clinic with Randon Goldsmith. Instructed to add metolazone 2.5 mg on Mondays Wednesdays and Fridays and continue the loop diuretic dose. This worked well for her in the past. We'll double the potassium supplement and add magnesium. Recheck labs in a few days Reviewed sodium restriction and daily weights again. She is on maximum tolerated doses of carvedilol and ARB. I think her symptoms of abdominal discomfort and  distention in the early satiety are all explained by volume retention and hepatic distention rather than her hernia. 2. History of paroxysmal atrial flutter: No recent atrial or ventricular arrhythmia. Will reconsider need for anticoagulation based on arrhythmia detected by ICD. No arrhythmia seen since device implantation. 3. CAD: asymptomatic 4. HTN: good BP control 5. AICD: normal device function. Enroll in remote HF monitoring 6. AI: review echo yearly. Will check before her next appointment, but would like to diurese her first.    Medication Adjustments/Labs and Tests Ordered: Current medicines are reviewed at length with the patient today.  Concerns regarding medicines are outlined above.  Medication changes, Labs and Tests ordered today are listed in the Patient Instructions below. Patient Instructions  Medication Instructions: Dr Royann Shivers has recommended making the following medication changes: 1. START Metolazone 2.5 mg - take 1 tablet 30 MINUTES PRIOR TO LASIX on Mondays, Wednesdays, and Fridays 2. START Mag-Ox 400 mg - take 1 tablet by mouth daily 3. INCREASE Potassium to 1 tablet TWICE daily  Labwork: Your physician recommends that you return for lab work next week.  Testing/Procedures: NONE ORDERED  Follow-up: Dr Royann Shivers recommends that you schedule a follow-up appointment in 1 week.  If you need a refill on your cardiac medications before your next appointment, please call your pharmacy.       Signed, Thurmon Fair, MD  11/17/2015 11:35 AM    [facility] [address] [place], [place], [place]  [address] Phone: [phone]; Fax: [phone]

## 2015-11-18 ENCOUNTER — Encounter: Payer: Commercial Managed Care - HMO | Admitting: Cardiovascular Disease

## 2015-11-19 DIAGNOSIS — I255 Ischemic cardiomyopathy: Secondary | ICD-10-CM | POA: Diagnosis not present

## 2015-11-19 DIAGNOSIS — K4090 Unilateral inguinal hernia, without obstruction or gangrene, not specified as recurrent: Secondary | ICD-10-CM | POA: Diagnosis not present

## 2015-11-22 DIAGNOSIS — I509 Heart failure, unspecified: Secondary | ICD-10-CM | POA: Diagnosis not present

## 2015-11-22 DIAGNOSIS — I5042 Chronic combined systolic (congestive) and diastolic (congestive) heart failure: Secondary | ICD-10-CM | POA: Diagnosis not present

## 2015-11-23 LAB — BASIC METABOLIC PANEL
BUN: 16 mg/dL (ref 7–25)
CALCIUM: 8.9 mg/dL (ref 8.6–10.4)
CO2: 30 mmol/L (ref 20–31)
Chloride: 99 mmol/L (ref 98–110)
Creat: 0.99 mg/dL (ref 0.50–0.99)
Glucose, Bld: 104 mg/dL — ABNORMAL HIGH (ref 65–99)
POTASSIUM: 3.7 mmol/L (ref 3.5–5.3)
SODIUM: 136 mmol/L (ref 135–146)

## 2015-11-23 LAB — MAGNESIUM: MAGNESIUM: 2 mg/dL (ref 1.5–2.5)

## 2015-11-24 ENCOUNTER — Telehealth: Payer: Self-pay | Admitting: Cardiology

## 2015-11-24 ENCOUNTER — Ambulatory Visit (INDEPENDENT_AMBULATORY_CARE_PROVIDER_SITE_OTHER): Payer: Commercial Managed Care - HMO

## 2015-11-24 DIAGNOSIS — I5042 Chronic combined systolic (congestive) and diastolic (congestive) heart failure: Secondary | ICD-10-CM | POA: Diagnosis not present

## 2015-11-24 DIAGNOSIS — Z95810 Presence of automatic (implantable) cardiac defibrillator: Secondary | ICD-10-CM

## 2015-11-24 NOTE — Progress Notes (Signed)
EPIC Encounter for ICM Monitoring  Patient Name: Sheri Dunn is a 65 y.o. female Date: 11/24/2015 Primary Care Physican: Katy ApoPOLITE,RONALD D, MD Primary Cardiologist: Croitoru Electrophysiologist: Croitoru Dry Weight: 118 lbs       In the past month, have you:  1. Gained more than 2 pounds in a day or more than 5 pounds in a week?  In MD office was 134 lbs but now weighing 118 lbs now.    2. Had changes in your medications (with verification of current medications)? Metolazone every Monday, Wednesday and Friday ordered on 11/17/2015  3. Had more shortness of breath than is usual for you? Breathing has improved since last office visit on 11/17/2015.   4. Limited your activity because of shortness of breath? No   5. Not been able to sleep because of shortness of breath? No   6. Had increased swelling in your feet, ankles, legs or stomach area? Swelling in stomach has decreased  7. Had symptoms of dehydration (dizziness, dry mouth, increased thirst, decreased urine output) No   8. Had changes in sodium restriction? No   9. Been compliant with medication? Yes   ICM trend: 3 month view for 11/24/2015   ICM trend: 1 year view for 11/24/2015   Follow-up plan: ICM clinic phone appointment 12/02/2015.  1st ICM encounter.  Office visit with Theodore Demarkhonda Barrett, PA on 11/25/2015.  FLUID LEVELS: Optivol thoracic impedance decreased since March but is starting to improve since office visit on 11/17/2015.  Impedance still suggesting she has some fluid accumulation but very close to baseline.     SYMPTOMS:  Symptoms are improving.  Weight has dropped significantly from 132 lbs, by her scale to 118 lbs.  She was 137 lbs in Dr ToysRusCroitoru's office  EDUCATION: Limit sodium intake to < 2000 mg and fluid intake to 64 oz daily.     RECOMMENDATIONS: No changes today since she has an office appointment tomorrow.    Advised will send to Theodore Demarkhonda Barrett, PA for review at appointment on 11/25/2015.      Karie SodaLaurie S  Short, RN, CCM 11/24/2015 4:09 PM

## 2015-11-24 NOTE — Telephone Encounter (Signed)
LMOVM reminding pt to send remote transmission.   

## 2015-11-25 ENCOUNTER — Encounter: Payer: Self-pay | Admitting: Physician Assistant

## 2015-11-25 ENCOUNTER — Ambulatory Visit (INDEPENDENT_AMBULATORY_CARE_PROVIDER_SITE_OTHER): Payer: Commercial Managed Care - HMO | Admitting: Physician Assistant

## 2015-11-25 VITALS — BP 118/68 | HR 69 | Ht 66.0 in | Wt 124.2 lb

## 2015-11-25 DIAGNOSIS — R9431 Abnormal electrocardiogram [ECG] [EKG]: Secondary | ICD-10-CM | POA: Diagnosis not present

## 2015-11-25 DIAGNOSIS — I255 Ischemic cardiomyopathy: Secondary | ICD-10-CM

## 2015-11-25 DIAGNOSIS — I4581 Long QT syndrome: Secondary | ICD-10-CM | POA: Diagnosis not present

## 2015-11-25 DIAGNOSIS — I509 Heart failure, unspecified: Secondary | ICD-10-CM | POA: Diagnosis not present

## 2015-11-25 DIAGNOSIS — I10 Essential (primary) hypertension: Secondary | ICD-10-CM | POA: Diagnosis not present

## 2015-11-25 DIAGNOSIS — I5042 Chronic combined systolic (congestive) and diastolic (congestive) heart failure: Secondary | ICD-10-CM

## 2015-11-25 DIAGNOSIS — I2589 Other forms of chronic ischemic heart disease: Secondary | ICD-10-CM | POA: Diagnosis not present

## 2015-11-25 DIAGNOSIS — I5021 Acute systolic (congestive) heart failure: Secondary | ICD-10-CM | POA: Diagnosis not present

## 2015-11-25 LAB — BASIC METABOLIC PANEL
BUN: 18 mg/dL (ref 7–25)
CALCIUM: 9.6 mg/dL (ref 8.6–10.4)
CHLORIDE: 95 mmol/L — AB (ref 98–110)
CO2: 28 mmol/L (ref 20–31)
CREATININE: 0.99 mg/dL (ref 0.50–0.99)
Glucose, Bld: 86 mg/dL (ref 65–99)
POTASSIUM: 4 mmol/L (ref 3.5–5.3)
SODIUM: 137 mmol/L (ref 135–146)

## 2015-11-25 NOTE — Progress Notes (Signed)
Cardiology Office Note   Date:  11/25/2015   ID:  Sheri Dunn, DOB 11/27/1950, MRN [id number]  PCP:  Katy Apo, MD  Cardiologist:  Dr Chrisandra Netters, PA-C   Chief Complaint  Patient presents with  . Edema    f/u    History of Present Illness: Sheri Dunn is a 65 y.o. female with a history of S-D-CHF, HTN, HLD, DES RCA 09/2014, Medtronic Visia AF ICD 04/2015.  Seen by Dr Royann Shivers 06/14 and weight was up 7 lbs, started on metolazone and Mag-Ox, K+ increased, f/u 1 week.  Sheri Dunn presents for Follow-up of her heart failure.  Since being started on the metolazone, her weight is down 7 pounds. Her lower extremity edema and respiratory status had significantly improved. She feels she is back down to her dry weight. Today she was 118 pounds on her home scales. She states that she wants to gain some weight but is working not to gain back the fluid.  She still has a small amount of lower extremity edema. However, she does not wake with this in the morning. She will get some swelling almost as soon as she gets out of bed. This is improved by elastic socks or compression stockings. She is not wearing either one of those today. She is working at eating a low sodium diet. She states she has been compliant with her medications. She hopes we can cut back on the diuretics because all she does is go to the bathroom. She has not had cramping or muscle spasms.    Past Medical History  Diagnosis Date  . Hypertension   . HLD (hyperlipidemia)   . CAD (coronary artery disease)     a. NSTEMI 4/16:  LHC - mCFX 90, dRCA 90, EF 20% >> PCI: Synergy DES to mCFX and Synergy DES to dRCA  . Ischemic cardiomyopathy     a. Echo 4/16:  EF 25-30%, diff HK, ant-septal HK, Gr 1 DD, mod AI, mod LAE, mild RVE;  b.  Echo 7/16:  EF 20-25%, severe HK, Gr 1 DD, mod AI, mod MR, mod LAE, mod RVE, mod reduced RVSF, mild RAE  . Chronic combined systolic and diastolic CHF (congestive heart  failure) (HCC)   . Prolonged Q-T interval on ECG   . Elevated ALT measurement     Past Surgical History  Procedure Laterality Date  . Left heart catheterization with coronary angiogram N/A 09/08/2014    Procedure: LEFT HEART CATHETERIZATION WITH CORONARY ANGIOGRAM;  Surgeon: Corky Crafts, MD;  Location: [facility] CATH LAB;  Service: Cardiovascular;  Laterality: N/A;  . Cardiac catheterization Right 09/08/2014    Procedure: CORONARY STENT INTERVENTION;  Surgeon: Corky Crafts, MD;  Location: [facility] CATH LAB;  Service: Cardiovascular;  Laterality: Right;  . Ep implantable device N/A 04/27/2015    Procedure: ICD Implant;  Surgeon: Thurmon Fair, MD; Medtronic Visia AF, Model [id number] serial number [id number]     Current Outpatient Prescriptions  Medication Sig Dispense Refill  . aspirin 81 MG tablet Take 81 mg by mouth daily.    Marland Kitchen atorvastatin (LIPITOR) 20 MG tablet Take 1 tablet (20 mg total) by mouth daily. 30 tablet 11  . carvedilol (COREG) 12.5 MG tablet Take 1 tablet (12.5 mg total) by mouth 2 (two) times daily with a meal. 180 tablet 3  . Cholecalciferol (VITAMIN D3) 2000 UNITS TABS Take 2,000 Units by mouth daily.    . furosemide (LASIX) 40 MG tablet Take 1 tablet (40  mg total) by mouth 2 (two) times daily. 60 tablet 3  . magnesium oxide (MAG-OX) 400 MG tablet Take 400 mg by mouth daily.    . metolazone (ZAROXOLYN) 2.5 MG tablet Take 1 tablet (2.5 mg total) by mouth 3 (three) times a week. 30 minutes before taking Lasix on Mondays, Wednesdays, and Fridays. 45 tablet 3  . potassium chloride (K-DUR) 10 MEQ tablet Take 1 tablet (10 mEq total) by mouth 2 (two) times daily. 180 tablet 3  . valsartan (DIOVAN) 160 MG tablet Take 160 mg by mouth daily.  0   No current facility-administered medications for this visit.    Allergies:   Review of patient's allergies indicates no known allergies.    Social History:  The patient  reports that she has never smoked. She has never used smokeless  tobacco. She reports that she does not drink alcohol or use illicit drugs.   Family History:  The patient's family history includes Cystic fibrosis in her sister; Diabetes in her mother; Heart attack in her mother; Stroke in her mother.    ROS:  Please see the history of present illness. All other systems are reviewed and negative.    PHYSICAL EXAM: VS:  BP 118/68 mmHg  Pulse 69  Ht 5\' 6"  (1.676 m)  Wt 124 lb 3.2 oz (56.337 kg)  BMI 20.06 kg/m2 , BMI Body mass index is 20.06 kg/(m^2). GEN: Well nourished, well developed, female in no acute distress HEENT: normal for age  Neck: JVD at 9 cm, no carotid bruit, no masses Cardiac: RRR; soft murmur, no rubs, soft S3, no S4 Respiratory:  Few rales bases bilaterally, normal work of breathing GI: soft, nontender, nondistended, + BS MS: no deformity or atrophy; trace bilateral lower extremity edema, left greater than right; distal pulses are 2+ in all 4 extremities, left lower extremity varicosities are noted Skin: warm and dry, no rash Neuro:  Strength and sensation are intact Psych: euthymic mood, full affect   EKG:  EKG is ordered today. The ekg ordered today demonstrates sinus rhythm, inferolateral T-wave changes are slightly improved from 06/14 ECG  ECHO: 12/2014 - Left ventricle: The cavity size was moderately dilated. Wall  thickness was normal. Systolic function was severely reduced. The  estimated ejection fraction was in the range of 20% to 25%.  Severe hypokinesis of the entire myocardium. Doppler parameters  are consistent with abnormal left ventricular relaxation (grade 1  diastolic dysfunction). - Aortic valve: There was moderate regurgitation. - Mitral valve: There was moderate regurgitation. - Left atrium: The atrium was moderately dilated. - Right ventricle: The cavity size was moderately dilated. Systolic  function was moderately reduced. - Right atrium: The atrium was mildly dilated.   Recent  Labs: 06/16/2015: Hemoglobin 13.9; Platelets 172 07/26/2015: Brain Natriuretic Peptide 4106.6* 08/16/2015: ALT 21 11/22/2015: BUN 16; Creat 0.99; Magnesium 2.0; Potassium 3.7; Sodium 136    Lipid Panel    Component Value Date/Time   CHOL 78 06/18/2015 0404   TRIG 37 06/18/2015 0404   HDL 29* 06/18/2015 0404   CHOLHDL 2.7 06/18/2015 0404   VLDL 7 06/18/2015 0404   LDLCALC 42 06/18/2015 0404     Wt Readings from Last 3 Encounters:  11/25/15 124 lb 3.2 oz (56.337 kg)  11/17/15 137 lb 9.6 oz (62.415 kg)  08/23/15 124 lb 3.2 oz (56.337 kg)     Other studies Reviewed: Additional studies/ records that were reviewed today include: Previous office notes and hospital records.  ASSESSMENT AND PLAN:  1.  Chronic combined systolic and diastolic CHF: Her volume status is at baseline by her home scales at 118 pounds. She is encouraged to continue a low sodium diet. We will leave her on the Lasix 40 mg twice a day, but make the Zaroxolyn when necessary only. We will check a BMET today in follow-up on the results.  The transmission from her Arlyn LeakOpivol was reviewed. Her values are still elevated but are trending back down. Hopefully they will normalize. She is ventricular sensing 100% of the time with no significant ventricular pacing.  Follow-up with Dr. Royann Shiversroitoru in one month  2. ICD in situ: She has a Medtronic device. The data were reviewed from the transmission and she has no significant high ventricular rate episodes. She is not requiring ventricular pacing.  3. Hypertension: Her blood pressure is well-controlled today  Current medicines are reviewed at length with the patient today.  The patient does not have concerns regarding medicines.  The following changes have been made:  Change Zaroxolyn to when necessary only  Labs/ tests ordered today include: BMET    Disposition:   FU with Dr. Royann Shiversroitoru  Signed, Leanna BattlesBarrett, Eila Runyan, PA-C  11/25/2015 10:55 AM    [facility] Phone: [phone]; Fax: [phone]  This note was written with the assistance of speech recognition software. Please excuse any transcriptional errors.

## 2015-11-25 NOTE — Patient Instructions (Signed)
Lab Work ( bmet )   Take Zaroxolyn if needed only    Your physician recommends that you schedule a follow-up appointment 1 month with Dr.Croitoru

## 2015-12-02 ENCOUNTER — Ambulatory Visit (INDEPENDENT_AMBULATORY_CARE_PROVIDER_SITE_OTHER): Payer: Commercial Managed Care - HMO

## 2015-12-02 DIAGNOSIS — I5042 Chronic combined systolic (congestive) and diastolic (congestive) heart failure: Secondary | ICD-10-CM

## 2015-12-02 DIAGNOSIS — Z95810 Presence of automatic (implantable) cardiac defibrillator: Secondary | ICD-10-CM

## 2015-12-02 NOTE — Progress Notes (Signed)
EPIC Encounter for ICM Monitoring  Patient Name: Sheri CurlsCarolyn Nevers is a 65 y.o. female Date: 12/02/2015 Primary Care Physican: Katy ApoPOLITE,RONALD D, MD Primary Cardiologist: Croitoru Electrophysiologist: Croitoru Dry Weight: 117 lb       Heart Failure questions reviewed, pt asymptomatic Thoracic impedence returned to baseline Patient restarted on Metolazone at last office visit on 11/25/2015.    ICM trend:     Follow-up plan: ICM clinic phone appointment 01/06/2016.     Karie SodaLaurie S Short, RN 12/02/2015 2:47 PM

## 2015-12-20 DIAGNOSIS — E118 Type 2 diabetes mellitus with unspecified complications: Secondary | ICD-10-CM | POA: Diagnosis not present

## 2015-12-20 DIAGNOSIS — K4090 Unilateral inguinal hernia, without obstruction or gangrene, not specified as recurrent: Secondary | ICD-10-CM | POA: Diagnosis not present

## 2015-12-20 DIAGNOSIS — I10 Essential (primary) hypertension: Secondary | ICD-10-CM | POA: Diagnosis not present

## 2015-12-20 DIAGNOSIS — I255 Ischemic cardiomyopathy: Secondary | ICD-10-CM | POA: Diagnosis not present

## 2015-12-21 LAB — CUP PACEART INCLINIC DEVICE CHECK
HighPow Impedance: 38 Ohm
Implantable Lead Location: 753860
Lead Channel Impedance Value: 361 Ohm
Lead Channel Pacing Threshold Amplitude: 0.625 V
Lead Channel Pacing Threshold Pulse Width: 0.4 ms
Lead Channel Setting Pacing Pulse Width: 0.4 ms
Lead Channel Setting Sensing Sensitivity: 0.3 mV
MDC IDC LEAD IMPLANT DT: 20161122
MDC IDC MSMT BATTERY REMAINING LONGEVITY: 133 mo
MDC IDC MSMT BATTERY VOLTAGE: 3.06 V
MDC IDC MSMT LEADCHNL RV IMPEDANCE VALUE: 342 Ohm
MDC IDC MSMT LEADCHNL RV SENSING INTR AMPL: 13.875 mV
MDC IDC MSMT LEADCHNL RV SENSING INTR AMPL: 13.875 mV
MDC IDC SESS DTM: 20170614133337
MDC IDC SET LEADCHNL RV PACING AMPLITUDE: 2 V
MDC IDC STAT BRADY RV PERCENT PACED: 0.01 %

## 2015-12-24 ENCOUNTER — Other Ambulatory Visit: Payer: Self-pay | Admitting: *Deleted

## 2015-12-24 DIAGNOSIS — E785 Hyperlipidemia, unspecified: Secondary | ICD-10-CM

## 2015-12-24 MED ORDER — ATORVASTATIN CALCIUM 20 MG PO TABS: 20.0000 mg | ORAL_TABLET | Freq: Every day | ORAL | Status: AC

## 2016-01-06 ENCOUNTER — Ambulatory Visit (INDEPENDENT_AMBULATORY_CARE_PROVIDER_SITE_OTHER): Payer: Commercial Managed Care - HMO

## 2016-01-06 DIAGNOSIS — I5042 Chronic combined systolic (congestive) and diastolic (congestive) heart failure: Secondary | ICD-10-CM

## 2016-01-06 DIAGNOSIS — Z95810 Presence of automatic (implantable) cardiac defibrillator: Secondary | ICD-10-CM | POA: Diagnosis not present

## 2016-01-06 NOTE — Progress Notes (Signed)
EPIC Encounter for ICM Monitoring  Patient Name: Sheri Dunn is a 65 y.o. female Date: 01/06/2016 Primary Care Physican: Katy Apo, MD Primary Cardiologist: Croitoru Electrophysiologist: Croitoru Dry Weight: 119 lb       Heart Failure questions reviewed, pt symptomatic with slight swelling of feet.  She reported weight fluctuates but is not weighing at same time with same clothes on.  Advised for accuracy to weight when she gets out of bed and empties her bladder with same amount of clothes to monitor for fluid weight. She should report any weight gain of 2-3 lbs overnight or 5 lbs in a week  Thoracic impedance abnormal suggesting fluid accumulation 12/19/2015 to 01/06/2016 but trending toward baseline today.  Recommendations: No changes. Appointment with Dr Royann Shivers on 01/10/2016.     ICM trend: 01/06/2016     Follow-up plan: ICM clinic phone appointment on 02/29/2016.  Copy of ICM check sent to device physician.   Karie Soda, RN 01/06/2016 3:27 PM

## 2016-01-10 ENCOUNTER — Encounter: Payer: Self-pay | Admitting: Cardiovascular Disease

## 2016-01-10 ENCOUNTER — Encounter (INDEPENDENT_AMBULATORY_CARE_PROVIDER_SITE_OTHER): Payer: Self-pay

## 2016-01-10 ENCOUNTER — Ambulatory Visit (INDEPENDENT_AMBULATORY_CARE_PROVIDER_SITE_OTHER): Payer: Commercial Managed Care - HMO | Admitting: Cardiovascular Disease

## 2016-01-10 VITALS — BP 100/60 | HR 68 | Ht 66.0 in | Wt 119.8 lb

## 2016-01-10 DIAGNOSIS — I4892 Unspecified atrial flutter: Secondary | ICD-10-CM

## 2016-01-10 DIAGNOSIS — Z95810 Presence of automatic (implantable) cardiac defibrillator: Secondary | ICD-10-CM

## 2016-01-10 DIAGNOSIS — Z9861 Coronary angioplasty status: Secondary | ICD-10-CM

## 2016-01-10 DIAGNOSIS — E785 Hyperlipidemia, unspecified: Secondary | ICD-10-CM | POA: Diagnosis not present

## 2016-01-10 DIAGNOSIS — I5043 Acute on chronic combined systolic (congestive) and diastolic (congestive) heart failure: Secondary | ICD-10-CM

## 2016-01-10 DIAGNOSIS — I2510 Atherosclerotic heart disease of native coronary artery without angina pectoris: Secondary | ICD-10-CM

## 2016-01-10 DIAGNOSIS — R140 Abdominal distension (gaseous): Secondary | ICD-10-CM

## 2016-01-10 DIAGNOSIS — Z79899 Other long term (current) drug therapy: Secondary | ICD-10-CM | POA: Diagnosis not present

## 2016-01-10 DIAGNOSIS — I351 Nonrheumatic aortic (valve) insufficiency: Secondary | ICD-10-CM

## 2016-01-10 DIAGNOSIS — I10 Essential (primary) hypertension: Secondary | ICD-10-CM

## 2016-01-10 NOTE — Progress Notes (Signed)
Thurmon FairMihai Croitoru, MD  Karie SodaLaurie S Short, RN        Saw her in clinic today, she is looking much better clinically as well.  MCr

## 2016-01-10 NOTE — Patient Instructions (Signed)
Medication Instructions: Dr Royann Shiversroitoru has recommended making the following medication changes: 1. TAKE Metolazone when your weight is 120 pounds or higher  Labwork: Your physician recommends that you return for lab work at your convenience - FASTING.  Testing/Procedures: 1. Abdominal Ultrasound - [facility]. See map provided.  Follow-up: Dr C recommends that you schedule a follow-up appointment in 3 months.  If you need a refill on your cardiac medications before your next appointment, please call your pharmacy.

## 2016-01-10 NOTE — Progress Notes (Signed)
Patient ID: Sheri Dunn, female   DOB: [birth year], 65 y.o.   MRN: [id number]    Cardiology Office Note    Date:  01/10/2016   ID:  Sheri Dunn, DOB November 20, 1950, MRN [id number]  PCP:  Katy Apo, MD  Cardiologist:   Thurmon Fair, MD   Chief Complaint  Patient presents with  . Follow-up    1 month  pt c/o occasional SOB and swelling in legs/feet/ankles     History of Present Illness:  Sheri Dunn is a 65 y.o. female with severe ischemic cardiomyopathy, chronic systolic and diastolic biventricular HF, CAD s/p NSTEMI and DES to RCA and LCX approximately April 2016, s/p ICD November 2016 (Medtronic Visia AF).  She has had substantial diuresis and her weight is down close to what we estimated to be her dry weight. She is no longer dyspneic and her jugular veins are no longer elevated. She still has complaints of abdominal distention, discomfort and early satiety. She says that after she eats she feels immediately full. She had a CT scan of the abdomen and pelvis performed in March that showed bilateral inguinal hernias. On the right side the hernia is larger and contains intestine, but neither side showed evidence of strangulation or incarceration. There were hepatic signs of right heart failure and excess fluid, but no report of cirrhosis.  She has NYHA class 2 dyspnea, minimal ankle edema, occasional palpitations, but does not have angina, orthopnea, supine cough (previously a prominent HF symptom for her) syncope or ICD shock. Her ICD site is well healed. She is now taking metolazone only as needed for ankle swelling.  Her ICD shows normal battery and lead parameters and no AF or VT. The Corvue shows evidence of normalized thoracic impedance (fluid overload resolved) since last month. Ventricular arrhythmia and atrial fibrillation have not been detected. V pacing is <0.1%. Generator longevity estimated at 11 years.   She initially presented with heart disease in April 2016 when she  had a non-ST segment elevation myocardial infarction. She received a drug-eluting stent to the mid circumflex coronary artery and another drug-eluting stent to distal right coronary artery. Ejection fraction at that time was 20% by angiography, 25-30% by echocardiographyand failed to improve on medical therapy (still 20-25% on echo July 2016). She has a relatively narrow QRS complex on ECG. She received a Medtronic Visia AF defibrillator in November 2016.  Past Medical History:  Diagnosis Date  . CAD (coronary artery disease)    a. NSTEMI 4/16:  LHC - mCFX 90, dRCA 90, EF 20% >> PCI: Synergy DES to mCFX and Synergy DES to dRCA  . Chronic combined systolic and diastolic CHF (congestive heart failure) (HCC)   . Elevated ALT measurement   . HLD (hyperlipidemia)   . Hypertension   . Ischemic cardiomyopathy    a. Echo 4/16:  EF 25-30%, diff HK, ant-septal HK, Gr 1 DD, mod AI, mod LAE, mild RVE;  b.  Echo 7/16:  EF 20-25%, severe HK, Gr 1 DD, mod AI, mod MR, mod LAE, mod RVE, mod reduced RVSF, mild RAE  . Prolonged Q-T interval on ECG     Past Surgical History:  Procedure Laterality Date  . CARDIAC CATHETERIZATION Right 09/08/2014   Procedure: CORONARY STENT INTERVENTION;  Surgeon: Corky Crafts, MD;  Location: [facility] CATH LAB;  Service: Cardiovascular;  Laterality: Right;  . EP IMPLANTABLE DEVICE N/A 04/27/2015   Procedure: ICD Implant;  Surgeon: Thurmon Fair, MD; Medtronic Visia AF, Model DVAB1D4 serial number [id number]   .  LEFT HEART CATHETERIZATION WITH CORONARY ANGIOGRAM N/A 09/08/2014   Procedure: LEFT HEART CATHETERIZATION WITH CORONARY ANGIOGRAM;  Surgeon: Corky CraftsJayadeep S Varanasi, MD;  [facility];  Service: Cardiovascular;  Laterality: N/A;    Outpatient Medications Prior to Visit  Medication Sig Dispense Refill  . aspirin 81 MG tablet Take 81 mg by mouth daily.    Marland Kitchen. atorvastatin (LIPITOR) 20 MG tablet Take 1 tablet (20 mg total) by mouth daily. 30 tablet 11  . carvedilol  (COREG) 12.5 MG tablet Take 1 tablet (12.5 mg total) by mouth 2 (two) times daily with a meal. 180 tablet 3  . Cholecalciferol (VITAMIN D3) 2000 UNITS TABS Take 2,000 Units by mouth daily.    . furosemide (LASIX) 40 MG tablet Take 1 tablet (40 mg total) by mouth 2 (two) times daily. 60 tablet 3  . magnesium oxide (MAG-OX) 400 MG tablet Take 400 mg by mouth daily.    . metolazone (ZAROXOLYN) 2.5 MG tablet Take 1 tablet (2.5 mg total) by mouth 3 (three) times a week. 30 minutes before taking Lasix on Mondays, Wednesdays, and Fridays. 45 tablet 3  . potassium chloride (K-DUR) 10 MEQ tablet Take 1 tablet (10 mEq total) by mouth 2 (two) times daily. 180 tablet 3  . valsartan (DIOVAN) 160 MG tablet Take 160 mg by mouth daily.  0   No facility-administered medications prior to visit.      Allergies:   Review of patient's allergies indicates no known allergies.   Social History   Social History  . Marital status: Single    Spouse name: N/A  . Number of children: 1  . Years of education: N/A   Social History Main Topics  . Smoking status: Never Smoker  . Smokeless tobacco: Never Used  . Alcohol use No  . Drug use: No  . Sexual activity: No   Other Topics Concern  . None   Social History Narrative  . None     Family History:  The patient's family history includes Cystic fibrosis in her sister; Diabetes in her mother; Heart attack in her mother; Stroke in her mother.   ROS:   Please see the history of present illness.    ROS All other systems reviewed and are negative.   PHYSICAL EXAM:   VS:  BP 100/60 (BP Location: Left Arm, Patient Position: Sitting, Cuff Size: Normal)   Pulse 68   Ht 5\' 6"  (1.676 m)   Wt 119 lb 12.8 oz (54.3 kg)   BMI 19.34 kg/m    GEN: Well nourished, well developed, in no acute distress  HEENT: normal  Neck:  JVD 2-3 cm, no carotid bruits, or masses Cardiac: RRR; no murmurs or rubs, S3 gallop present,3+ ankle and pretibial bilateral edema  Respiratory:   clear to auscultation bilaterally, normal work of breathing GI: soft, nontender, mildly distended, marked hepatomegaly >5 cm beneath the costal margin, no evidence of strangulated or incarcerated right inguinal hernia, normal BS MS: no deformity or atrophy  Skin: warm and dry, no rash Neuro:  Alert and Oriented x 3, Strength and sensation are intact Psych: euthymic mood, full affect  Wt Readings from Last 3 Encounters:  01/10/16 119 lb 12.8 oz (54.3 kg)  11/25/15 124 lb 3.2 oz (56.3 kg)  11/17/15 137 lb 9.6 oz (62.4 kg)      Studies/Labs Reviewed:   EKG:  EKG is not ordered today.   Recent Labs: 06/16/2015: Hemoglobin 13.9; Platelets 172 07/26/2015: Brain Natriuretic Peptide 4,106.6 08/16/2015:  ALT 21 11/22/2015: Magnesium 2.0 11/25/2015: BUN 18; Creat 0.99; Potassium 4.0; Sodium 137   Lipid Panel    Component Value Date/Time   CHOL 78 06/18/2015 0404   TRIG 37 06/18/2015 0404   HDL 29 (L) 06/18/2015 0404   CHOLHDL 2.7 06/18/2015 0404   VLDL 7 06/18/2015 0404   LDLCALC 42 06/18/2015 0404      ASSESSMENT:    1. Acute on chronic combined systolic and diastolic ACC/AHA stage C congestive heart failure (HCC)   2. Abdominal distention   3. Paroxysmal atrial flutter (HCC)   4. CAD S/P PCI April 2016   5. Essential hypertension   6. ICD (implantable cardioverter-defibrillator) in place   7. Aortic insufficiency   8. Dyslipidemia   9. Medication management      PLAN:  In order of problems listed above:  1. CHF: Substantial improvement in hypervolemia by exam and Corvue. Did enroll her in the heart failure device clinic with Randon Goldsmith. Instructed to add metolazone 2.5 mg on days when weight is 120 lb or above. Continue the same  loop diuretic dose. We'll double the potassium supplement and add magnesium. Recheck labs in a few days Reviewed sodium restriction and daily weights again. She is on maximum tolerated doses of carvedilol and ARB. Blood pressure precludes further  titration.  2. Abdominal distention and early satiety persist. Will order an abdominal ultrasound to look for primary liver abnormalities. 3. History of paroxysmal atrial flutter: No recent atrial or ventricular arrhythmia. Will reconsider need for anticoagulation based on arrhythmia detected by ICD. No arrhythmia seen since device implantation. 4. CAD: asymptomatic 5. HTN: good BP control 6. AICD: normal device function. Enrolled in remote HF monitoring 7. AI: review echo yearly. Time to reorder. 8. HLP: Most recent lipid profile numbers were very low, raising additional concern for some liver abnormalities. Recheck.   Medication Adjustments/Labs and Tests Ordered: Current medicines are reviewed at length with the patient today.  Concerns regarding medicines are outlined above.  Medication changes, Labs and Tests ordered today are listed in the Patient Instructions below. Patient Instructions  Medication Instructions: Dr Royann Shivers has recommended making the following medication changes: 1. TAKE Metolazone when your weight is 120 pounds or higher  Labwork: Your physician recommends that you return for lab work at your convenience - FASTING.  Testing/Procedures: 1. Abdominal Ultrasound - this has been ordered at [facility] in the [facility] building. See map provided.  Follow-up: Dr C recommends that you schedule a follow-up appointment in 3 months.  If you need a refill on your cardiac medications before your next appointment, please call your pharmacy.      Signed, Thurmon Fair, MD  01/10/2016 1:01 PM    [facility] [address] [place], [place], [place]  [address] Phone: [phone]; Fax: [phone]

## 2016-01-11 LAB — LIPID PANEL
CHOL/HDL RATIO: 2.8 ratio (ref <=5.0)
CHOLESTEROL: 92 mg/dL — AB (ref 125–200)
HDL: 33 mg/dL — ABNORMAL LOW (ref >=46)
LDL Cholesterol: 45 mg/dL (ref <130)
TRIGLYCERIDES: 69 mg/dL (ref <150)
VLDL: 14 mg/dL (ref <30)

## 2016-01-11 LAB — COMPREHENSIVE METABOLIC PANEL
ALBUMIN: 3.9 g/dL (ref 3.6–5.1)
ALT: 11 U/L (ref 6–29)
AST: 21 U/L (ref 10–35)
Alkaline Phosphatase: 177 U/L — ABNORMAL HIGH (ref 33–130)
BUN: 13 mg/dL (ref 7–25)
CALCIUM: 9.5 mg/dL (ref 8.6–10.4)
CHLORIDE: 100 mmol/L (ref 98–110)
CO2: 26 mmol/L (ref 20–31)
Creat: 0.83 mg/dL (ref 0.50–0.99)
Glucose, Bld: 80 mg/dL (ref 65–99)
POTASSIUM: 4.4 mmol/L (ref 3.5–5.3)
Sodium: 134 mmol/L — ABNORMAL LOW (ref 135–146)
TOTAL PROTEIN: 7.1 g/dL (ref 6.1–8.1)
Total Bilirubin: 1.1 mg/dL (ref 0.2–1.2)

## 2016-01-13 LAB — CUP PACEART INCLINIC DEVICE CHECK
Battery Remaining Longevity: 133 mo
Battery Voltage: 3.06 V
Brady Statistic RV Percent Paced: 0.01 %
Date Time Interrogation Session: 20170807160448
HIGH POWER IMPEDANCE MEASURED VALUE: 39 Ohm
Lead Channel Impedance Value: 304 Ohm
Lead Channel Impedance Value: 399 Ohm
Lead Channel Sensing Intrinsic Amplitude: 13.875 mV
Lead Channel Setting Pacing Amplitude: 2 V
Lead Channel Setting Pacing Pulse Width: 0.4 ms
Lead Channel Setting Sensing Sensitivity: 0.3 mV
MDC IDC LEAD IMPLANT DT: 20161122
MDC IDC LEAD LOCATION: 753860
MDC IDC MSMT LEADCHNL RV PACING THRESHOLD AMPLITUDE: 0.75 V
MDC IDC MSMT LEADCHNL RV PACING THRESHOLD PULSEWIDTH: 0.4 ms
MDC IDC MSMT LEADCHNL RV SENSING INTR AMPL: 13.875 mV

## 2016-01-14 ENCOUNTER — Telehealth: Payer: Self-pay

## 2016-01-14 DIAGNOSIS — I351 Nonrheumatic aortic (valve) insufficiency: Secondary | ICD-10-CM

## 2016-01-14 NOTE — Telephone Encounter (Signed)
Called with lab results and communicated recommendations with patient. Patient verbalized understanding and agreed with plan. Order placed. Message sent to scheduler to set up appointment.

## 2016-01-14 NOTE — Telephone Encounter (Signed)
-----   Message from Thurmon FairMihai Croitoru, MD sent at 01/10/2016  2:35 PM EDT ----- Please order echo for aortic insufficiency before her next appointment

## 2016-01-17 ENCOUNTER — Encounter: Payer: Self-pay | Admitting: Cardiovascular Disease

## 2016-01-18 ENCOUNTER — Ambulatory Visit
Admission: RE | Admit: 2016-01-18 | Discharge: 2016-01-18 | Disposition: A | Discharge status: Home / Self Care | Payer: Commercial Managed Care - HMO | Source: Ambulatory Visit | Attending: Cardiovascular Disease | Admitting: Cardiovascular Disease

## 2016-01-18 DIAGNOSIS — R140 Abdominal distension (gaseous): Secondary | ICD-10-CM

## 2016-01-18 DIAGNOSIS — R198 Other specified symptoms and signs involving the digestive system and abdomen: Secondary | ICD-10-CM | POA: Diagnosis not present

## 2016-02-08 DIAGNOSIS — K4020 Bilateral inguinal hernia, without obstruction or gangrene, not specified as recurrent: Secondary | ICD-10-CM | POA: Diagnosis not present

## 2016-02-08 DIAGNOSIS — I509 Heart failure, unspecified: Secondary | ICD-10-CM | POA: Diagnosis not present

## 2016-02-08 DIAGNOSIS — I255 Ischemic cardiomyopathy: Secondary | ICD-10-CM | POA: Diagnosis not present

## 2016-02-08 DIAGNOSIS — M4125 Other idiopathic scoliosis, thoracolumbar region: Secondary | ICD-10-CM | POA: Diagnosis not present

## 2016-02-08 DIAGNOSIS — M546 Pain in thoracic spine: Secondary | ICD-10-CM | POA: Diagnosis not present

## 2016-02-29 ENCOUNTER — Ambulatory Visit (INDEPENDENT_AMBULATORY_CARE_PROVIDER_SITE_OTHER): Payer: Commercial Managed Care - HMO

## 2016-02-29 DIAGNOSIS — I5042 Chronic combined systolic (congestive) and diastolic (congestive) heart failure: Secondary | ICD-10-CM

## 2016-02-29 DIAGNOSIS — Z95810 Presence of automatic (implantable) cardiac defibrillator: Secondary | ICD-10-CM

## 2016-02-29 NOTE — Progress Notes (Signed)
EPIC Encounter for ICM Monitoring  Patient Name: Sheri Dunn is a 65 y.o. female Date: 02/29/2016 Primary Care Physican: Katy ApoPOLITE,RONALD D, MD Primary Cardiologist: Croitoru Electrophysiologist: Croitoru Dry Weight: 110 lb        Heart Failure questions reviewed, pt asymptomatic   Thoracic impedance normal   Recommendations: No changes.  Low sodium diet education provided.    Follow-up plan: ICM clinic phone appointment on 03/31/2016.  Copy of ICM check sent to device physician.   ICM trend: 02/29/2016       Karie SodaLaurie S Short, RN 02/29/2016 2:13 PM

## 2016-02-29 NOTE — Progress Notes (Signed)
Thank you :)

## 2016-03-10 ENCOUNTER — Other Ambulatory Visit (INDEPENDENT_AMBULATORY_CARE_PROVIDER_SITE_OTHER): Payer: Commercial Managed Care - HMO

## 2016-03-10 DIAGNOSIS — R748 Abnormal levels of other serum enzymes: Secondary | ICD-10-CM | POA: Diagnosis not present

## 2016-03-10 LAB — HEPATIC FUNCTION PANEL
ALBUMIN: 3.8 g/dL (ref 3.5–5.2)
ALT: 16 U/L (ref 0–35)
AST: 22 U/L (ref 0–37)
Alkaline Phosphatase: 175 U/L — ABNORMAL HIGH (ref 39–117)
BILIRUBIN TOTAL: 1.2 mg/dL (ref 0.2–1.2)
Bilirubin, Direct: 0.5 mg/dL — ABNORMAL HIGH (ref 0.0–0.3)
Total Protein: 7.3 g/dL (ref 6.0–8.3)

## 2016-03-13 ENCOUNTER — Encounter: Payer: Self-pay | Admitting: Gastroenterology

## 2016-03-14 DIAGNOSIS — I255 Ischemic cardiomyopathy: Secondary | ICD-10-CM | POA: Diagnosis not present

## 2016-03-14 DIAGNOSIS — E119 Type 2 diabetes mellitus without complications: Secondary | ICD-10-CM | POA: Diagnosis not present

## 2016-03-14 DIAGNOSIS — Z95810 Presence of automatic (implantable) cardiac defibrillator: Secondary | ICD-10-CM | POA: Diagnosis not present

## 2016-03-14 DIAGNOSIS — K4020 Bilateral inguinal hernia, without obstruction or gangrene, not specified as recurrent: Secondary | ICD-10-CM | POA: Diagnosis not present

## 2016-03-14 DIAGNOSIS — E7800 Pure hypercholesterolemia, unspecified: Secondary | ICD-10-CM | POA: Diagnosis not present

## 2016-03-14 DIAGNOSIS — I10 Essential (primary) hypertension: Secondary | ICD-10-CM | POA: Diagnosis not present

## 2016-03-16 NOTE — Progress Notes (Signed)
Letter sent 10/12.jf 

## 2016-03-20 ENCOUNTER — Other Ambulatory Visit: Payer: Self-pay

## 2016-03-20 MED ORDER — VALSARTAN 160 MG PO TABS: 160.0000 mg | ORAL_TABLET | Freq: Every day | ORAL | 1 refills | Status: DC

## 2016-03-23 ENCOUNTER — Other Ambulatory Visit: Payer: Self-pay | Admitting: *Deleted

## 2016-03-23 MED ORDER — CARVEDILOL 12.5 MG PO TABS: 12.5000 mg | ORAL_TABLET | Freq: Two times a day (BID) | ORAL | 3 refills | Status: DC

## 2016-03-31 ENCOUNTER — Ambulatory Visit (HOSPITAL_COMMUNITY): Payer: Commercial Managed Care - HMO | Attending: Cardiovascular Disease

## 2016-03-31 ENCOUNTER — Other Ambulatory Visit: Payer: Self-pay

## 2016-03-31 ENCOUNTER — Ambulatory Visit (INDEPENDENT_AMBULATORY_CARE_PROVIDER_SITE_OTHER): Payer: Commercial Managed Care - HMO

## 2016-03-31 DIAGNOSIS — Z95810 Presence of automatic (implantable) cardiac defibrillator: Secondary | ICD-10-CM

## 2016-03-31 DIAGNOSIS — I2510 Atherosclerotic heart disease of native coronary artery without angina pectoris: Secondary | ICD-10-CM | POA: Diagnosis not present

## 2016-03-31 DIAGNOSIS — I4892 Unspecified atrial flutter: Secondary | ICD-10-CM | POA: Diagnosis not present

## 2016-03-31 DIAGNOSIS — I351 Nonrheumatic aortic (valve) insufficiency: Secondary | ICD-10-CM

## 2016-03-31 DIAGNOSIS — I313 Pericardial effusion (noninflammatory): Secondary | ICD-10-CM | POA: Insufficient documentation

## 2016-03-31 DIAGNOSIS — I5042 Chronic combined systolic (congestive) and diastolic (congestive) heart failure: Secondary | ICD-10-CM

## 2016-03-31 DIAGNOSIS — I359 Nonrheumatic aortic valve disorder, unspecified: Secondary | ICD-10-CM | POA: Diagnosis present

## 2016-03-31 DIAGNOSIS — E785 Hyperlipidemia, unspecified: Secondary | ICD-10-CM | POA: Diagnosis not present

## 2016-03-31 DIAGNOSIS — I509 Heart failure, unspecified: Secondary | ICD-10-CM | POA: Insufficient documentation

## 2016-03-31 DIAGNOSIS — I255 Ischemic cardiomyopathy: Secondary | ICD-10-CM | POA: Insufficient documentation

## 2016-03-31 DIAGNOSIS — I071 Rheumatic tricuspid insufficiency: Secondary | ICD-10-CM | POA: Insufficient documentation

## 2016-03-31 DIAGNOSIS — I252 Old myocardial infarction: Secondary | ICD-10-CM | POA: Diagnosis not present

## 2016-03-31 DIAGNOSIS — I340 Nonrheumatic mitral (valve) insufficiency: Secondary | ICD-10-CM | POA: Diagnosis not present

## 2016-03-31 DIAGNOSIS — I110 Hypertensive heart disease with heart failure: Secondary | ICD-10-CM | POA: Insufficient documentation

## 2016-03-31 NOTE — Progress Notes (Signed)
EPIC Encounter for ICM Monitoring  Patient Name: Sheri CurlsCarolyn Dunn is a 65 y.o. female Date: 03/31/2016 Primary Care Physican: Katy ApoPOLITE,RONALD D, MD Primary Cardiologist:Croitoru Electrophysiologist: Croitoru Dry Weight: 113 lb         Heart Failure questions reviewed, pt has a little swelling in feet.   Thoracic impedance abnormal suggesting fluid accumulation.  She is currently taking Furosemide 40 mg 1 tablet daily and can take 2nd tablet if needed for fluid symptoms.    Recommendations:  Advised to take Furosemide 40 mg 2 tablets x 2 days and then return to prescribed dosage of Furosemide 40 mg 1 tablet daily.  Advised Dr Royann Shiversroitoru will check the fluid levels at the appointment on 11/8.   Encouraged to call for fluid symptoms.    Follow-up plan: ICM clinic phone appointment on 05/12/2016.  Office appointment with Dr Royann Shiversroitoru 04/12/2016.  Copy of ICM check sent to primary cardiologist and device physician.   ICM trend: 03/31/2016       Karie SodaLaurie S Short, RN 03/31/2016 9:52 AM

## 2016-03-31 NOTE — Progress Notes (Signed)
Agree. Thanks MCr 

## 2016-04-12 ENCOUNTER — Ambulatory Visit (INDEPENDENT_AMBULATORY_CARE_PROVIDER_SITE_OTHER): Payer: Commercial Managed Care - HMO | Admitting: Cardiovascular Disease

## 2016-04-12 ENCOUNTER — Encounter: Payer: Self-pay | Admitting: Cardiovascular Disease

## 2016-04-12 VITALS — BP 105/65 | HR 65 | Ht 66.0 in | Wt 126.6 lb

## 2016-04-12 DIAGNOSIS — I4892 Unspecified atrial flutter: Secondary | ICD-10-CM | POA: Diagnosis not present

## 2016-04-12 DIAGNOSIS — E7800 Pure hypercholesterolemia, unspecified: Secondary | ICD-10-CM

## 2016-04-12 DIAGNOSIS — Z95810 Presence of automatic (implantable) cardiac defibrillator: Secondary | ICD-10-CM | POA: Diagnosis not present

## 2016-04-12 DIAGNOSIS — K4020 Bilateral inguinal hernia, without obstruction or gangrene, not specified as recurrent: Secondary | ICD-10-CM | POA: Diagnosis not present

## 2016-04-12 DIAGNOSIS — I351 Nonrheumatic aortic (valve) insufficiency: Secondary | ICD-10-CM

## 2016-04-12 DIAGNOSIS — I5042 Chronic combined systolic (congestive) and diastolic (congestive) heart failure: Secondary | ICD-10-CM

## 2016-04-12 DIAGNOSIS — Z9861 Coronary angioplasty status: Secondary | ICD-10-CM

## 2016-04-12 DIAGNOSIS — I2510 Atherosclerotic heart disease of native coronary artery without angina pectoris: Secondary | ICD-10-CM

## 2016-04-12 MED ORDER — SPIRONOLACTONE 25 MG PO TABS: 25.0000 mg | ORAL_TABLET | Freq: Every day | ORAL | 3 refills | Status: AC

## 2016-04-12 MED ORDER — POTASSIUM CHLORIDE ER 10 MEQ PO TBCR: 10.0000 meq | EXTENDED_RELEASE_TABLET | Freq: Every day | ORAL | 3 refills | Status: DC

## 2016-04-12 NOTE — Patient Instructions (Signed)
Dr Royann Shiversroitoru has recommended making the following medication changes: 1. START Spironolactone 25 mg - take 1 tablet once daily 2. DECREASE Potassium to 1 tablet daily  Your physician recommends that you return for lab work in 10 days.  Dr Royann Shiversroitoru recommends that you schedule a follow-up appointment in 3 months.  If you need a refill on your cardiac medications before your next appointment, please call your pharmacy.  You have been referred to [facility] Gay[facility]elle [facility]duEric Wilson [facility] for bilateral inguinal hernias.  Your physician has requested that you regularly monitor and record your blood pressure readings at home. Please use the same machine at the same time of day to check your readings and record them to bring to your follow-up visit.

## 2016-04-12 NOTE — Progress Notes (Signed)
Patient ID: Sheri CurlsCarolyn Dunn, female   DOB: Apr 30, 1951, 65 y.o.   MRN: [id number]    Cardiology Office Note    Date:  04/13/2016   ID:  Sheri Dunn, DOB Apr 30, 1951, MRN [id number]  PCP:  Katy ApoPOLITE,RONALD D, MD  Cardiologist:   Thurmon FairMihai Tunisia Landgrebe, MD   Chief Complaint  Patient presents with  . Follow-up    History of Present Illness:  Sheri CurlsCarolyn Balentine is a 65 y.o. female with severe ischemic cardiomyopathy, chronic systolic and diastolic biventricular HF, CAD s/p NSTEMI and DES to RCA and LCX approximately April 2016, s/p ICD November 2016 (Medtronic Visia AF).  Her weight has increased again above what we estimated to be her "dry weight" of under 120 pounds, but she has only mild edema and mild exertional dyspnea (functional class II). She is worried today about enlarging bilateral inguinal hernias. She had a CT scan of the abdomen and pelvis performed in March that showed bilateral inguinal hernias. On the right side the hernia is larger and contains intestine, but neither side showed evidence of strangulation or incarceration. There were hepatic signs of right heart failure and excess fluid, but no report of cirrhosis. Common ultrasound showed ascites but also did not show evidence of either cirrhosis or gallstones. There was evidence of medical renal disease.  She has NYHA class 2 dyspnea, minimal ankle edema, occasional palpitations, but does not have angina, orthopnea, supine cough (previously a prominent HF symptom for her) syncope or ICD shock. Her ICD site is well healed. She is now taking metolazone only as needed for ankle swelling.  Echo on October 27 shows LVEF 20-25%, pseudo-normal mitral inflow, moderate aortic insufficiency, moderate mitral insufficiency, moderate enlargement of the right heart chambers with moderate tricuspid insufficiency and estimated systolic PA pressure of 47 mmHg.  Her ICD shows normal battery and lead parameters and no AF or VT. The Corvue shows recent evidence of  decreasing thoracic impedance (fluid overload), but recently with a trend of improvement. Ventricular arrhythmia and atrial fibrillation have not been detected. V pacing is <0.1%. Activity level remains mediocre at 0.6 hours/day, but is stable. Generator longevity estimated at 11 years.   She initially presented with heart disease in April 2016 when she had a non-ST segment elevation myocardial infarction. She received a drug-eluting stent to the mid circumflex coronary artery and another drug-eluting stent to distal right coronary artery. Ejection fraction at that time was 20% by angiography, 25-30% by echocardiographyand failed to improve on medical therapy (still 20-25% on echo July 2016). She has a relatively narrow QRS complex on ECG. She received a Medtronic Visia AF defibrillator in November 2016.  Past Medical History:  Diagnosis Date  . CAD (coronary artery disease)    a. NSTEMI 4/16:  LHC - mCFX 90, dRCA 90, EF 20% >> PCI: Synergy DES to mCFX and Synergy DES to dRCA  . Chronic combined systolic and diastolic CHF (congestive heart failure) (HCC)   . Elevated ALT measurement   . HLD (hyperlipidemia)   . Hypertension   . Ischemic cardiomyopathy    a. Echo 4/16:  EF 25-30%, diff HK, ant-septal HK, Gr 1 DD, mod AI, mod LAE, mild RVE;  b.  Echo 7/16:  EF 20-25%, severe HK, Gr 1 DD, mod AI, mod MR, mod LAE, mod RVE, mod reduced RVSF, mild RAE  . Prolonged Q-T interval on ECG     Past Surgical History:  Procedure Laterality Date  . CARDIAC CATHETERIZATION Right 09/08/2014   Procedure: CORONARY STENT INTERVENTION;  Surgeon: Corky CraftsJayadeep S Varanasi, MD;  [facility];  Service: Cardiovascular;  Laterality: Right;  . EP IMPLANTABLE DEVICE N/A 04/27/2015   Procedure: ICD Implant;  Surgeon: Thurmon FairMihai Robyne Matar, MD; Medtronic Visia AF, Model DVAB1D4 serial number [id number]   . LEFT HEART CATHETERIZATION WITH CORONARY ANGIOGRAM N/A 09/08/2014   Procedure: LEFT HEART CATHETERIZATION WITH CORONARY  ANGIOGRAM;  Surgeon: Corky CraftsJayadeep S Varanasi, MD;  [facility];  Service: Cardiovascular;  Laterality: N/A;    Outpatient Medications Prior to Visit  Medication Sig Dispense Refill  . aspirin 81 MG tablet Take 81 mg by mouth daily.    Marland Kitchen. atorvastatin (LIPITOR) 20 MG tablet Take 1 tablet (20 mg total) by mouth daily. 30 tablet 11  . carvedilol (COREG) 12.5 MG tablet Take 1 tablet (12.5 mg total) by mouth 2 (two) times daily with a meal. 180 tablet 3  . Cholecalciferol (VITAMIN D3) 2000 UNITS TABS Take 2,000 Units by mouth daily.    . furosemide (LASIX) 40 MG tablet Take 1 tablet (40 mg total) by mouth 2 (two) times daily. (Patient taking differently: Take 40 mg by mouth 2 (two) times daily. Taking 1 tablet daily.  Takes a 2nd tablet for fluid symptoms.) 60 tablet 3  . magnesium oxide (MAG-OX) 400 MG tablet Take 400 mg by mouth daily.    . metolazone (ZAROXOLYN) 2.5 MG tablet Take 1 tablet (2.5 mg total) by mouth 3 (three) times a week. 30 minutes before taking Lasix on Mondays, Wednesdays, and Fridays. 45 tablet 3  . valsartan (DIOVAN) 160 MG tablet Take 1 tablet (160 mg total) by mouth daily. 90 tablet 1  . potassium chloride (K-DUR) 10 MEQ tablet Take 1 tablet (10 mEq total) by mouth 2 (two) times daily. 180 tablet 3   No facility-administered medications prior to visit.      Allergies:   Patient has no known allergies.   Social History   Social History  . Marital status: Single    Spouse name: N/A  . Number of children: 1  . Years of education: N/A   Social History Main Topics  . Smoking status: Never Smoker  . Smokeless tobacco: Never Used  . Alcohol use No  . Drug use: No  . Sexual activity: No   Other Topics Concern  . None   Social History Narrative  . None     Family History:  The patient's family history includes Cystic fibrosis in her sister; Diabetes in her mother; Heart attack in her mother; Stroke in her mother.   ROS:   Please see the history of present  illness.    ROS All other systems reviewed and are negative.   PHYSICAL EXAM:   VS:  BP 105/65 (BP Location: Left Arm, Patient Position: Sitting, Cuff Size: Normal)   Pulse 65   Ht 5\' 6"  (1.676 m)   Wt 126 lb 9.6 oz (57.4 kg)   BMI 20.43 kg/m    GEN: Well nourished, well developed, in no acute distress  HEENT: normal  Neck:  JVD 2-3 cm, no carotid bruits, or masses Cardiac: RRR; no murmurs or rubs, S3 gallop present,3+ ankle and pretibial bilateral edema  Respiratory:  clear to auscultation bilaterally, normal work of breathing GI: soft, nontender, mildly distended, marked hepatomegaly >5 cm beneath the costal margin, no evidence of strangulated or incarcerated right inguinal hernia, normal BS MS: no deformity or atrophy  Skin: warm and dry, no rash Neuro:  Alert and Oriented x 3, Strength and sensation  are intact Psych: euthymic mood, full affect  Wt Readings from Last 3 Encounters:  04/12/16 126 lb 9.6 oz (57.4 kg)  01/10/16 119 lb 12.8 oz (54.3 kg)  11/25/15 124 lb 3.2 oz (56.3 kg)      Studies/Labs Reviewed:   EKG:  EKG is not ordered today.   Recent Labs: 06/16/2015: Hemoglobin 13.9; Platelets 172 07/26/2015: Brain Natriuretic Peptide 4,106.6 11/22/2015: Magnesium 2.0 01/10/2016: BUN 13; Creat 0.83; Potassium 4.4; Sodium 134 03/10/2016: ALT 16   Lipid Panel    Component Value Date/Time   CHOL 92 (L) 01/10/2016 0850   TRIG 69 01/10/2016 0850   HDL 33 (L) 01/10/2016 0850   CHOLHDL 2.8 01/10/2016 0850   VLDL 14 01/10/2016 0850   LDLCALC 45 01/10/2016 0850      ASSESSMENT:    1. Chronic combined systolic and diastolic CHF (congestive heart failure) (HCC)   2. Bilateral inguinal hernia without obstruction or gangrene, recurrence not specified   3. ICD (implantable cardioverter-defibrillator) in place      PLAN:  In order of problems listed above:  1. CHF: She continues to have periodic problems of hypervolemia, and his volume overloaded today. I'm not sure if  her blood pressure will allow, but will add spironolactone to assist with diuresis and heart failure management. Will reduce the potassium supplement and repeat labs in about 10 days. Reminded her to add metolazone 2.5 mg on days when weight is 120 lb or above. Continue the same  loop diuretic dose. She is on maximum tolerated doses of carvedilol and ARB. Blood pressure precludes further titration or switching to [employer]. Her ascites is due to heart failure/volume overload and not due to intrinsic liver disease. 2. History of paroxysmal atrial flutter: No recent atrial or ventricular arrhythmia. Will reconsider need for anticoagulation based on arrhythmia detected by ICD. No arrhythmia seen since device implantation. 3. CAD: asymptomatic. Last revascularization was in April 2016. 4. AICD: normal device function. Continue remote HF monitoring with Randon Goldsmith. 5. AI: Remains moderate at the time of her echo, review echo yearly. . 6. HLP: Most recent lipid profile numbers were very low on statin 7. Bilateral inguinal hernia (R>L): Her surgical risk is moderately elevated. However she is in a period of relative cardiac stability. The right-sided hernia is particularly large and at risk of complications. Not sure whether ascites is contributing to the enlargement of the hernia. We'll refer her for an evaluation by surgery. She is currently not receiving anticoagulant or antiplatelet therapy.   Medication Adjustments/Labs and Tests Ordered: Current medicines are reviewed at length with the patient today.  Concerns regarding medicines are outlined above.  Medication changes, Labs and Tests ordered today are listed in the Patient Instructions below. Patient Instructions  Dr Royann Shivers has recommended making the following medication changes: 1. START Spironolactone 25 mg - take 1 tablet once daily 2. DECREASE Potassium to 1 tablet daily  Your physician recommends that you return for lab work in 10  days.  Dr Royann Shivers recommends that you schedule a follow-up appointment in 3 months.  If you need a refill on your cardiac medications before your next appointment, please call your pharmacy.  You have been referred to Dr Gaynelle Adu at [facility] for bilateral inguinal hernias.  Your physician has requested that you regularly monitor and record your blood pressure readings at home. Please use the same machine at the same time of day to check your readings and record them to bring to your follow-up  visit.      Signed, Thurmon Fair, MD  04/13/2016 11:07 AM    [facility] [address] [place], [place], [place]  [address] Phone: [phone]; Fax: [phone]

## 2016-04-13 DIAGNOSIS — K4020 Bilateral inguinal hernia, without obstruction or gangrene, not specified as recurrent: Secondary | ICD-10-CM | POA: Insufficient documentation

## 2016-04-21 ENCOUNTER — Encounter: Payer: Self-pay | Admitting: Surgery

## 2016-04-21 DIAGNOSIS — K469 Unspecified abdominal hernia without obstruction or gangrene: Secondary | ICD-10-CM | POA: Diagnosis not present

## 2016-04-21 DIAGNOSIS — I5042 Chronic combined systolic (congestive) and diastolic (congestive) heart failure: Secondary | ICD-10-CM | POA: Diagnosis not present

## 2016-04-22 LAB — BASIC METABOLIC PANEL
BUN: 23 mg/dL (ref 7–25)
CHLORIDE: 101 mmol/L (ref 98–110)
CO2: 25 mmol/L (ref 20–31)
CREATININE: 0.68 mg/dL (ref 0.50–0.99)
Calcium: 9.3 mg/dL (ref 8.6–10.4)
Glucose, Bld: 80 mg/dL (ref 65–99)
Potassium: 4.3 mmol/L (ref 3.5–5.3)
Sodium: 139 mmol/L (ref 135–146)

## 2016-04-25 LAB — CUP PACEART INCLINIC DEVICE CHECK
Battery Remaining Longevity: 131 mo
Battery Voltage: 3.04 V
Brady Statistic RV Percent Paced: 0.01 %
Date Time Interrogation Session: 20171108194211
HighPow Impedance: 35 Ohm
Implantable Lead Location: 753860
Implantable Pulse Generator Implant Date: 20161122
Lead Channel Impedance Value: 399 Ohm
Lead Channel Pacing Threshold Pulse Width: 0.4 ms
Lead Channel Setting Pacing Amplitude: 2 V
Lead Channel Setting Pacing Pulse Width: 0.4 ms
Lead Channel Setting Sensing Sensitivity: 0.3 mV
MDC IDC LEAD IMPLANT DT: 20161122
MDC IDC MSMT LEADCHNL RV IMPEDANCE VALUE: 304 Ohm
MDC IDC MSMT LEADCHNL RV PACING THRESHOLD AMPLITUDE: 0.5 V
MDC IDC MSMT LEADCHNL RV SENSING INTR AMPL: 14.75 mV
MDC IDC MSMT LEADCHNL RV SENSING INTR AMPL: 14.75 mV

## 2016-05-01 ENCOUNTER — Telehealth: Payer: Self-pay | Admitting: Cardiovascular Disease

## 2016-05-01 MED ORDER — POTASSIUM CHLORIDE ER 10 MEQ PO TBCR: 10.0000 meq | EXTENDED_RELEASE_TABLET | Freq: Every day | ORAL | 6 refills | Status: AC

## 2016-05-01 NOTE — Telephone Encounter (Signed)
Pt was taking two Potassium pills every day, now she takes one each of two different Potassium pills, one was Potassium Chloride and the second Spironolactone, she wants to know if she still needs to take the Potassium Chloride  [phone]

## 2016-05-01 NOTE — Telephone Encounter (Signed)
Returning Sheri Dunn's call from 04-26-16.

## 2016-05-01 NOTE — Telephone Encounter (Signed)
Returned call to patient.Recent lab results given.Advised she should continue Spironolactone 25 mg daily and Kdur 10 meq daily.

## 2016-05-03 ENCOUNTER — Encounter: Payer: Self-pay | Admitting: Cardiovascular Disease

## 2016-05-12 ENCOUNTER — Ambulatory Visit (INDEPENDENT_AMBULATORY_CARE_PROVIDER_SITE_OTHER): Payer: Commercial Managed Care - HMO

## 2016-05-12 DIAGNOSIS — Z95810 Presence of automatic (implantable) cardiac defibrillator: Secondary | ICD-10-CM | POA: Diagnosis not present

## 2016-05-12 DIAGNOSIS — I5042 Chronic combined systolic (congestive) and diastolic (congestive) heart failure: Secondary | ICD-10-CM

## 2016-05-12 NOTE — Progress Notes (Signed)
EPIC Encounter for ICM Monitoring  Patient Name: Sheri Dunn is a 65 y.o. female Date: 05/12/2016 Primary Care Physican: Katy ApoPOLITE,RONALD D, MD Primary Cardiologist:Croitoru Electrophysiologist: Croitoru Dry Weight:    120 lbs       Heart Failure questions reviewed, pt asymptomatic   Thoracic impedance normal   Recommendations:  No changes.  Reinforced to limit low salt food choices to 2000 mg day and limiting fluid intake to < 2 liters per day. Encouraged to call for fluid symptoms.    Follow-up plan: ICM clinic phone appointment on 06/12/2016.  Copy of ICM check sent to cardiologist/device physician.   ICM trend: 05/12/2016       Karie SodaLaurie S Jolie Strohecker, RN 05/12/2016 12:25 PM

## 2016-05-18 ENCOUNTER — Ambulatory Visit (INDEPENDENT_AMBULATORY_CARE_PROVIDER_SITE_OTHER): Payer: Commercial Managed Care - HMO | Admitting: Gastroenterology

## 2016-05-18 ENCOUNTER — Encounter: Payer: Self-pay | Admitting: Gastroenterology

## 2016-05-18 VITALS — BP 98/60 | HR 68 | Ht 64.0 in | Wt 132.4 lb

## 2016-05-18 DIAGNOSIS — Z1211 Encounter for screening for malignant neoplasm of colon: Secondary | ICD-10-CM | POA: Diagnosis not present

## 2016-05-18 DIAGNOSIS — I509 Heart failure, unspecified: Secondary | ICD-10-CM

## 2016-05-18 DIAGNOSIS — R188 Other ascites: Secondary | ICD-10-CM

## 2016-05-18 DIAGNOSIS — R748 Abnormal levels of other serum enzymes: Secondary | ICD-10-CM | POA: Diagnosis not present

## 2016-05-18 NOTE — Patient Instructions (Signed)
If you are age 67 or older, your body mass index should be between 23-30. Your Body mass index is 22.72 kg/m. If this is out of the aforementioned range listed, please consider follow up with your Primary Care Provider.  If you are age 67 or younger, your body mass index should be between 19-25. Your Body mass index is 22.72 kg/m. If this is out of the aformentioned range listed, please consider follow up with your Primary Care Provider.   You have been scheduled for an [facility]. Your appointment is June 01, 2016 at 10:00am. You need to arrive 15 minutes prior to the appointment time. If you need to reschedule this please call [phone].  Thank you.

## 2016-05-18 NOTE — Progress Notes (Signed)
HPI :  65 year old female here for follow-up visit. She has a history of congestive heart failure as well as myocardial infarction leading to 2 coronary stents placed in April 2016. During that time she is noted to have fluctuating ALT greater than AST. He's had a prior echocardiogram with EF around 20% last done on October 27. She was previously on Effient but is no longer on this now greater than a year out after her catheterization and stenting.  Since her last visit she had a laboratory workup thousand negative for chronic liver diseases. She also had a normal ultrasound. A CT scan in March showed cardiomegaly with congestive changes in the hepatic veins consistent with right heart failure and large inguinal hernias with a normal biliary tree. She had a another ultrasound in August showing normal liver parenchyma but ascites was noted.  He has had some abdominal distention noted in her abdomen. She's been seen by general surgery for inguinal hernia repair they're wishing to hold off on any surgery given her comorbidities. She is overdue for colon cancer screening, last colonoscopy 11 years ago she thinks it was normal. She denies any blood in her stools or bowel problems.  Recent imaging US 03/2015 - normal liver CT scan 08/27/15 - cardiomegaly and congestive changes of hepatic veins from right heart failure, large R inguinal hernia, and left ingiunal hernia. Normal biliary tree.  US 01/18/16 - GB normal, CBD normal, liver normal, ascites noted     Past Medical History:  Diagnosis Date  . CAD (coronary artery disease)    a. NSTEMI 4/16:  LHC - mCFX 90, dRCA 90, EF 20% >> PCI: Synergy DES to mCFX and Synergy DES to dRCA  . Chronic combined systolic and diastolic CHF (congestive heart failure) (HCC)   . Elevated ALT measurement   . HLD (hyperlipidemia)   . Hypertension   . Ischemic cardiomyopathy    a. Echo 4/16:  EF 25-30%, diff HK, ant-septal HK, Gr 1 DD, mod AI, mod LAE, mild RVE;  b.   Echo 7/16:  EF 20-25%, severe HK, Gr 1 DD, mod AI, mod MR, mod LAE, mod RVE, mod reduced RVSF, mild RAE  . Prolonged Q-T interval on ECG      Past Surgical History:  Procedure Laterality Date  . CARDIAC CATHETERIZATION Right 09/08/2014   Procedure: CORONARY STENT INTERVENTION;  Surgeon: Corky CraftsJayadeep S Varanasi, MD;  [facility];  Service: Cardiovascular;  Laterality: Right;  . EP IMPLANTABLE DEVICE N/A 04/27/2015   Procedure: ICD Implant;  Surgeon: Thurmon FairMihai Croitoru, MD; Medtronic Visia AF, Model DVAB1D4 serial number [id number]   . LEFT HEART CATHETERIZATION WITH CORONARY ANGIOGRAM N/A 09/08/2014   Procedure: LEFT HEART CATHETERIZATION WITH CORONARY ANGIOGRAM;  Surgeon: Corky CraftsJayadeep S Varanasi, MD;  [facility];  Service: Cardiovascular;  Laterality: N/A;   Family History  Problem Relation Age of Onset  . Heart attack Mother   . Stroke Mother   . Diabetes Mother   . Cystic fibrosis Sister    Social History  Substance Use Topics  . Smoking status: Never Smoker  . Smokeless tobacco: Never Used  . Alcohol use No   Current Outpatient Prescriptions  Medication Sig Dispense Refill  . aspirin 81 MG tablet Take 81 mg by mouth daily.    Marland Kitchen. atorvastatin (LIPITOR) 20 MG tablet Take 1 tablet (20 mg total) by mouth daily. 30 tablet 11  . carvedilol (COREG) 12.5 MG tablet Take 1 tablet (12.5 mg total) by mouth 2 (  two) times daily with a meal. 180 tablet 3  . Cholecalciferol (VITAMIN D3) 2000 UNITS TABS Take 2,000 Units by mouth daily.    . furosemide (LASIX) 40 MG tablet Take 1 tablet (40 mg total) by mouth 2 (two) times daily. (Patient taking differently: Take 40 mg by mouth 2 (two) times daily. Taking 1 tablet daily.  Takes a 2nd tablet for fluid symptoms.) 60 tablet 3  . metolazone (ZAROXOLYN) 2.5 MG tablet Take 1 tablet (2.5 mg total) by mouth 3 (three) times a week. 30 minutes before taking Lasix on Mondays, Wednesdays, and Fridays. 45 tablet 3  . potassium chloride (K-DUR) 10 MEQ  tablet Take 1 tablet (10 mEq total) by mouth daily. 30 tablet 6  . spironolactone (ALDACTONE) 25 MG tablet Take 1 tablet (25 mg total) by mouth daily. 90 tablet 3  . valsartan (DIOVAN) 160 MG tablet Take 1 tablet (160 mg total) by mouth daily. 90 tablet 1  . magnesium oxide (MAG-OX) 400 MG tablet Take 400 mg by mouth daily.     No current facility-administered medications for this visit.    No Known Allergies   Review of Systems: All systems reviewed and negative except where noted in HPI.   Lab Results  Component Value Date   WBC 6.0 06/16/2015   HGB 13.9 06/16/2015   HCT 42.5 06/16/2015   MCV 85.7 06/16/2015   PLT 172 06/16/2015    Lab Results  Component Value Date   CREATININE 0.68 04/21/2016   BUN 23 04/21/2016   NA 139 04/21/2016   K 4.3 04/21/2016   CL 101 04/21/2016   CO2 25 04/21/2016    Lab Results  Component Value Date   ALT 16 03/10/2016   AST 22 03/10/2016   ALKPHOS 175 (H) 03/10/2016   BILITOT 1.2 03/10/2016    Lab Results  Component Value Date   INR 1.31 04/21/2015   INR 1.14 09/08/2014     Physical Exam: BP 98/60   Pulse 68   Ht 5\' 4"  (1.626 m)   Wt 132 lb 6 oz (60 kg)   BMI 22.72 kg/m  Constitutional: Pleasant,well-developed, female in no acute distress. HEENT: Normocephalic and atraumatic. Conjunctivae are normal. No scleral icterus. Neck supple.  Cardiovascular: Normal rate, regular rhythm.  Pulmonary/chest: Effort normal and breath sounds normal. No wheezing, rales or rhonchi. Abdominal: Soft, (+) ascites with mild distension, nontender.  There are no masses palpable. No hepatomegaly. Extremities: (+)2 edema Lymphadenopathy: No cervical adenopathy noted. Neurological: Alert and oriented to person place and time. Skin: Skin is warm and dry. No rashes noted. Psychiatric: Normal mood and affect. Behavior is normal.   ASSESSMENT AND PLAN: 65 year old female here for reassessment of elevated liver enzymes. History as outlined above,  she has undergone evaluation with serologies and imaging thus far, etiology of elevation in liver enzymes is very likely due to hepatic congestion. Over time her ALT has normalized however she has a persistently elevated alkaline phosphatase. CT imaging showed normal biliary tree and AMA, IgG4 is negative. Since I have last seen her does appear that she has developed some ascites in her abdomen is mildly distended. While I suspect this is likely due to her heart failure given new finding of ascites in the past few months I think diagnostic paracentesis is reasonable to confirm etiology and ensure no other pathology. I discussed paracentesis with her, low risk procedure think she would tolerate it well. We'll refer to IR for diagnostic paracentesis.   We otherwise discussed  colon cancer screening. She is now more than a year out from her MI she continues to have low ejection fraction with symptoms of heart failure. She wishes to avoid colonoscopy if at all possible. We discussed stool based testing, and if it is positive this would entail proceeding with colonoscopy. Given her other medical problems she is declining colon cancer screening at this time both with stool testing and colonoscopy. If she changes her mind she can contact us in the future  Ileene Patrick, MD [facility] Pager [phone]

## 2016-05-19 DIAGNOSIS — K469 Unspecified abdominal hernia without obstruction or gangrene: Secondary | ICD-10-CM | POA: Diagnosis not present

## 2016-05-26 ENCOUNTER — Other Ambulatory Visit: Payer: Self-pay | Admitting: Cardiovascular Disease

## 2016-05-31 ENCOUNTER — Other Ambulatory Visit: Payer: Self-pay | Admitting: Internal Medicine

## 2016-05-31 DIAGNOSIS — Z1231 Encounter for screening mammogram for malignant neoplasm of breast: Secondary | ICD-10-CM

## 2016-06-01 ENCOUNTER — Ambulatory Visit (HOSPITAL_COMMUNITY)
Admission: RE | Admit: 2016-06-01 | Discharge: 2016-06-01 | Disposition: A | Discharge status: Home / Self Care | Payer: Commercial Managed Care - HMO | Source: Ambulatory Visit | Attending: Gastroenterology | Admitting: Gastroenterology

## 2016-06-01 DIAGNOSIS — R748 Abnormal levels of other serum enzymes: Secondary | ICD-10-CM

## 2016-06-01 DIAGNOSIS — R188 Other ascites: Secondary | ICD-10-CM

## 2016-06-01 DIAGNOSIS — I509 Heart failure, unspecified: Secondary | ICD-10-CM

## 2016-06-01 LAB — BODY FLUID CELL COUNT WITH DIFFERENTIAL
Lymphs, Fluid: 61 %
MONOCYTE-MACROPHAGE-SEROUS FLUID: 35 % — AB (ref 50–90)
NEUTROPHIL FLUID: 4 % (ref 0–25)
WBC FLUID: 353 uL (ref 0–1000)

## 2016-06-01 LAB — GRAM STAIN

## 2016-06-01 LAB — PROTEIN, BODY FLUID: Total protein, fluid: 3.5 g/dL

## 2016-06-01 LAB — ALBUMIN, FLUID (OTHER): ALBUMIN FL: 2 g/dL

## 2016-06-01 NOTE — Procedures (Signed)
Ultrasound-guided diagnostic and therapeutic paracentesis performed yielding 4.1 liters of serosanguineous colored fluid. No immediate complications.  Karan Ramnauth E 11:06 AM 06/01/2016

## 2016-06-06 ENCOUNTER — Other Ambulatory Visit: Payer: Self-pay

## 2016-06-06 ENCOUNTER — Ambulatory Visit: Payer: Commercial Managed Care - HMO

## 2016-06-06 DIAGNOSIS — R7989 Other specified abnormal findings of blood chemistry: Secondary | ICD-10-CM

## 2016-06-06 DIAGNOSIS — R945 Abnormal results of liver function studies: Secondary | ICD-10-CM

## 2016-06-06 LAB — CULTURE, BODY FLUID W GRAM STAIN -BOTTLE: Culture: NO GROWTH

## 2016-06-06 LAB — CULTURE, BODY FLUID-BOTTLE

## 2016-06-12 ENCOUNTER — Telehealth: Payer: Self-pay | Admitting: Cardiology

## 2016-06-12 ENCOUNTER — Ambulatory Visit (INDEPENDENT_AMBULATORY_CARE_PROVIDER_SITE_OTHER): Payer: Commercial Managed Care - HMO

## 2016-06-12 DIAGNOSIS — I5042 Chronic combined systolic (congestive) and diastolic (congestive) heart failure: Secondary | ICD-10-CM

## 2016-06-12 DIAGNOSIS — Z95810 Presence of automatic (implantable) cardiac defibrillator: Secondary | ICD-10-CM

## 2016-06-12 NOTE — Telephone Encounter (Signed)
Spoke with pt and reminded pt of remote transmission that is due today. Pt verbalized understanding.   

## 2016-06-13 ENCOUNTER — Telehealth: Payer: Self-pay

## 2016-06-13 NOTE — Telephone Encounter (Signed)
Remote ICM transmission received.  Attempted patient call and left message to return call.   

## 2016-06-13 NOTE — Progress Notes (Signed)
EPIC Encounter for ICM Monitoring  Patient Name: Sheri CurlsCarolyn Lambrecht is a 66 y.o. female Date: 06/13/2016 Primary Care Physican: Katy ApoPOLITE,RONALD D, MD Primary Cardiologist:Croitoru Electrophysiologist: Croitoru Dry Weight:unknown                                              Attempted ICM call and unable to reach.  Left message to return call.  Transmission reviewed.    Thoracic impedance normal   Recommendations:  Provided ICM number for return call  Follow-up plan: ICM clinic phone appointment on 08/15/2016.   Office defib check with Dr Royann Shiversroitoru 07/14/2016  Copy of ICM check sent to cardiologist/device physician.   3 month ICM trend : 06/12/2016   1 Year ICM trend:      Karie SodaLaurie S Evva Din, RN 06/13/2016 9:52 AM

## 2016-06-14 NOTE — Progress Notes (Signed)
Better, thanks MCr 

## 2016-06-19 ENCOUNTER — Telehealth: Payer: Self-pay | Admitting: Cardiovascular Disease

## 2016-06-19 NOTE — Telephone Encounter (Signed)
New Message  Pt voiced wanting to know if she can take coricidin for her cough and runny nose.  Please f/u

## 2016-06-19 NOTE — Telephone Encounter (Signed)
Pt aware coricidin generally OK to take w heart meds, at standard OTC dosing. Pt advised on OTC cold meds to avoid, aware to verify w pharmacy if uncertain. Voiced thanks for call.

## 2016-06-29 ENCOUNTER — Ambulatory Visit
Admission: RE | Admit: 2016-06-29 | Discharge: 2016-06-29 | Disposition: A | Discharge status: Home / Self Care | Payer: Medicare HMO | Source: Ambulatory Visit | Attending: Internal Medicine | Admitting: Internal Medicine

## 2016-06-29 DIAGNOSIS — Z1231 Encounter for screening mammogram for malignant neoplasm of breast: Secondary | ICD-10-CM | POA: Diagnosis not present

## 2016-07-03 DIAGNOSIS — K4020 Bilateral inguinal hernia, without obstruction or gangrene, not specified as recurrent: Secondary | ICD-10-CM | POA: Diagnosis not present

## 2016-07-03 DIAGNOSIS — K429 Umbilical hernia without obstruction or gangrene: Secondary | ICD-10-CM | POA: Diagnosis not present

## 2016-07-03 DIAGNOSIS — R188 Other ascites: Secondary | ICD-10-CM | POA: Diagnosis not present

## 2016-07-03 DIAGNOSIS — M6208 Separation of muscle (nontraumatic), other site: Secondary | ICD-10-CM | POA: Diagnosis not present

## 2016-07-14 ENCOUNTER — Encounter: Payer: Self-pay | Admitting: Cardiovascular Disease

## 2016-07-14 ENCOUNTER — Ambulatory Visit (INDEPENDENT_AMBULATORY_CARE_PROVIDER_SITE_OTHER): Payer: Medicare HMO | Admitting: Cardiovascular Disease

## 2016-07-14 VITALS — HR 65 | Ht 64.0 in | Wt 126.0 lb

## 2016-07-14 DIAGNOSIS — E7800 Pure hypercholesterolemia, unspecified: Secondary | ICD-10-CM

## 2016-07-14 DIAGNOSIS — I351 Nonrheumatic aortic (valve) insufficiency: Secondary | ICD-10-CM

## 2016-07-14 DIAGNOSIS — I4892 Unspecified atrial flutter: Secondary | ICD-10-CM

## 2016-07-14 DIAGNOSIS — Z9861 Coronary angioplasty status: Secondary | ICD-10-CM | POA: Diagnosis not present

## 2016-07-14 DIAGNOSIS — Z01810 Encounter for preprocedural cardiovascular examination: Secondary | ICD-10-CM

## 2016-07-14 DIAGNOSIS — I5042 Chronic combined systolic (congestive) and diastolic (congestive) heart failure: Secondary | ICD-10-CM

## 2016-07-14 DIAGNOSIS — I2510 Atherosclerotic heart disease of native coronary artery without angina pectoris: Secondary | ICD-10-CM

## 2016-07-14 DIAGNOSIS — Z95810 Presence of automatic (implantable) cardiac defibrillator: Secondary | ICD-10-CM | POA: Diagnosis not present

## 2016-07-14 NOTE — Patient Instructions (Signed)
Dr Royann Shiversroitoru recommends that you continue on your current medications as directed. Please refer to the Current Medication list given to you today.  Remote monitoring is used to monitor your Pacemaker of ICD from home. This monitoring reduces the number of office visits required to check your device to one time per year. It allows us to keep an eye on the functioning of your device to ensure it is working properly. You are scheduled for a device check from home on Friday, May 11th, 2018. You may send your transmission at any time that day. If you have a wireless device, the transmission will be sent automatically. After your physician reviews your transmission, you will receive a postcard with your next transmission date.  Dr Royann Shiversroitoru recommends that you schedule a follow-up appointment in 6 months with a defibrillator check. You will receive a reminder letter in the mail two months in advance. If you don't receive a letter, please call our office to schedule the follow-up appointment.  If you need a refill on your cardiac medications before your next appointment, please call your pharmacy.

## 2016-07-14 NOTE — Progress Notes (Signed)
Patient ID: Sheri CurlsCarolyn Dunn, female   DOB: 1951-05-09, 66 y.o.   MRN: [id number]    Cardiology Office Note    Date:  07/15/2016   ID:  Sheri CurlsCarolyn Dunn, DOB 1951-05-09, MRN [id number]  PCP:  Katy ApoPOLITE,RONALD D, MD  Cardiologist:   Thurmon FairMihai Glennis Borger, MD   No chief complaint on file.   History of Present Illness:  Sheri Dunn is a 66 y.o. female with severe ischemic cardiomyopathy, chronic systolic and diastolic biventricular HF, CAD s/p NSTEMI and DES to RCA and LCX approximately April 2016, s/p ICD November 2016 ([employer]tronic Visia AF).  She has enlarging inguinal hernias and is considering surgical repair. She has seen Dr. Michaell CowingGross and they have discussed the procedure.  Her weight is above what we estimated to be her "dry weight" of under 120 pounds, but she has only mild edema and mild exertional dyspnea (functional class II). She has rare palpitations, but does not have angina, orthopnea, supine cough (previously a prominent HF symptom for her) syncope or ICD shock. She is now taking metolazone only as needed for ankle swelling, usually no more than once a month.  Echo on March 31, 2016 shows LVEF 20-25%, pseudonormal mitral inflow, moderate aortic insufficiency, moderate mitral insufficiency, moderate enlargement of the right heart chambers with moderate tricuspid insufficiency and estimated systolic PA pressure of 47 mmHg.  Her Medtronic Visia ICD shows normal battery and lead parameters and no AF or VT. The Optivol shows stable thoracic impedance (no evidence of intrathoracic fluid overload). V pacing is not seen. Activity level remains mediocre at 0.5 hours/day, but is stable. Generator longevity estimated at 10.5 years.   She initially presented with heart disease in April 2016 when she had a non-ST segment elevation myocardial infarction. She received a drug-eluting stent to the mid circumflex coronary artery and another drug-eluting stent to distal right coronary artery. Ejection fraction at  that time was 20% by angiography, 25-30% by echocardiographyand failed to improve on medical therapy (still 20-25% on echo July 2016). She has a relatively narrow QRS complex on ECG. She received a Medtronic Visia AF defibrillator in November 2016.  Past Medical History:  Diagnosis Date  . CAD (coronary artery disease)    a. NSTEMI 4/16:  LHC - mCFX 90, dRCA 90, EF 20% >> PCI: Synergy DES to mCFX and Synergy DES to dRCA  . Chronic combined systolic and diastolic CHF (congestive heart failure) (HCC)   . Elevated ALT measurement   . HLD (hyperlipidemia)   . Hypertension   . Ischemic cardiomyopathy    a. Echo 4/16:  EF 25-30%, diff HK, ant-septal HK, Gr 1 DD, mod AI, mod LAE, mild RVE;  b.  Echo 7/16:  EF 20-25%, severe HK, Gr 1 DD, mod AI, mod MR, mod LAE, mod RVE, mod reduced RVSF, mild RAE  . Prolonged Q-T interval on ECG     Past Surgical History:  Procedure Laterality Date  . CARDIAC CATHETERIZATION Right 09/08/2014   Procedure: CORONARY STENT INTERVENTION;  Surgeon: Corky CraftsJayadeep S Varanasi, MD;  [facility];  Service: Cardiovascular;  Laterality: Right;  . EP IMPLANTABLE DEVICE N/A 04/27/2015   Procedure: ICD Implant;  Surgeon: Thurmon FairMihai Lewie Deman, MD; Medtronic Visia AF, Model [id number] serial number [id number]   . LEFT HEART CATHETERIZATION WITH CORONARY ANGIOGRAM N/A 09/08/2014   Procedure: LEFT HEART CATHETERIZATION WITH CORONARY ANGIOGRAM;  Surgeon: Corky CraftsJayadeep S Varanasi, MD;  [facility];  Service: Cardiovascular;  Laterality: N/A;    Outpatient Medications Prior to Visit  Medication Sig Dispense Refill  . aspirin 81 MG tablet Take 81 mg by mouth daily.    Marland Kitchen atorvastatin (LIPITOR) 20 MG tablet Take 1 tablet (20 mg total) by mouth daily. 30 tablet 11  . carvedilol (COREG) 12.5 MG tablet Take 1 tablet (12.5 mg total) by mouth 2 (two) times daily with a meal. 180 tablet 3  . Cholecalciferol (VITAMIN D3) 2000 UNITS TABS Take 2,000 Units by mouth daily.    . furosemide (LASIX)  40 MG tablet take 2 tablets (80 MG) by mouth once daily WHEN YOUR WEIGHT REACHES 117 POUNDS REDUCE BACK TO 1 TABLET (40MG ) EVERY DAY 90 tablet 3  . metolazone (ZAROXOLYN) 2.5 MG tablet Take 1 tablet (2.5 mg total) by mouth 3 (three) times a week. 30 minutes before taking Lasix on Mondays, Wednesdays, and Fridays. 45 tablet 3  . potassium chloride (K-DUR) 10 MEQ tablet Take 1 tablet (10 mEq total) by mouth daily. 30 tablet 6  . valsartan (DIOVAN) 160 MG tablet Take 1 tablet (160 mg total) by mouth daily. 90 tablet 1  . spironolactone (ALDACTONE) 25 MG tablet Take 1 tablet (25 mg total) by mouth daily. 90 tablet 3  . magnesium oxide (MAG-OX) 400 MG tablet Take 400 mg by mouth daily.     No facility-administered medications prior to visit.      Allergies:   Patient has no known allergies.   Social History   Social History  . Marital status: Single    Spouse name: N/A  . Number of children: 1  . Years of education: N/A   Social History Main Topics  . Smoking status: Never Smoker  . Smokeless tobacco: Never Used  . Alcohol use No  . Drug use: No  . Sexual activity: No   Other Topics Concern  . Not on file   Social History Narrative  . No narrative on file     Family History:  The patient's family history includes Cystic fibrosis in her sister; Diabetes in her mother; Heart attack in her mother; Stroke in her mother.   ROS:   Please see the history of present illness.    ROS All other systems reviewed and are negative.   PHYSICAL EXAM:   VS:  Pulse 65   Ht 5\' 4"  (1.626 m)   Wt 57.2 kg (126 lb)   SpO2 98%   BMI 21.63 kg/m    GEN: Well nourished, well developed, in no acute distress  HEENT: normal  Neck:  JVD 4 cm, no carotid bruits, or masses Cardiac: RRR; no murmurs or rubs, S3 gallop present,3+ ankle and pretibial bilateral edema  Respiratory:  clear to auscultation bilaterally, normal work of breathing GI: soft, nontender, mildly distended, marked hepatomegaly >5 cm  beneath the costal margin, no evidence of strangulated or incarcerated right inguinal hernia, normal BS MS: no deformity or atrophy  Skin: warm and dry, no rash Neuro:  Alert and Oriented x 3, Strength and sensation are intact Psych: euthymic mood, full affect  Wt Readings from Last 3 Encounters:  07/14/16 57.2 kg (126 lb)  05/18/16 60 kg (132 lb 6 oz)  04/12/16 57.4 kg (126 lb 9.6 oz)      Studies/Labs Reviewed:   EKG:  EKG is not ordered today.   Recent Labs: 07/26/2015: Brain Natriuretic Peptide 4,106.6 11/22/2015: Magnesium 2.0 03/10/2016: ALT 16 04/21/2016: BUN 23; Creat 0.68; Potassium 4.3; Sodium 139   Lipid Panel    Component Value Date/Time   CHOL 92 (L)  01/10/2016 0850   TRIG 69 01/10/2016 0850   HDL 33 (L) 01/10/2016 0850   CHOLHDL 2.8 01/10/2016 0850   VLDL 14 01/10/2016 0850   LDLCALC 45 01/10/2016 0850      ASSESSMENT:    1. ICD (implantable cardioverter-defibrillator) in place      PLAN:  In order of problems listed above:  1. CHF: She has not had recent heart failure exacerbation. Although her weight is above her estimated "dry weight" she has findings of right heart overload, not left heart failure. He is on maximum tolerated doses of carvedilol, valsartan and spironolactone. Rarely needs to take additional metolazone 2.5 mg (recommended on days when weight is 120 lb or above on home scale). Continue the same  loop diuretic dose. Blood pressure precludes switching to [employer]. Her ascites is due to heart failure/volume overload and not due to intrinsic liver disease based on the results of her recent abdominal CT and ultrasound.. 2. History of paroxysmal atrial flutter: No recent atrial or ventricular arrhythmia. Will reconsider need for anticoagulation based on arrhythmia detected by ICD. No arrhythmia seen since device implantation. 3. CAD: asymptomatic. Last revascularization was in April 2016. No longer on dual antiplatelet therapy. 4. AICD: normal  device function. Continue remote HF monitoring with Randon Goldsmith. 5. AI: Remains moderate at the time of her echo, review echo yearly.  6. HLP: Most recent lipid profile numbers were very low on statin 7. Preop/Bilateral inguinal hernia (R>L): Her surgical risk is moderately elevated. However she is in a period of relative cardiac stability. The right-sided hernia is particularly large and at risk of complications. I think the risk of surgery now is lower than it would be for an emergency such as incarceration. She is currently not receiving anticoagulant or antiplatelet therapy. Her diuretics can be held on the day of surgery, but carvedilol should be administered without interruption throughout the perioperative period. Avoid the administration of excessive amounts of intravenous fluids. It is very unlikely the surgical procedure will interfere with her normal defibrillator function..   Medication Adjustments/Labs and Tests Ordered: Current medicines are reviewed at length with the patient today.  Concerns regarding medicines are outlined above.  Medication changes, Labs and Tests ordered today are listed in the Patient Instructions below. Patient Instructions  Dr Royann Shivers recommends that you continue on your current medications as directed. Please refer to the Current Medication list given to you today.  Remote monitoring is used to monitor your Pacemaker of ICD from home. This monitoring reduces the number of office visits required to check your device to one time per year. It allows [place] to keep an eye on the functioning of your device to ensure it is working properly. You are scheduled for a device check from home on Friday, May 11th, 2018. You may send your transmission at any time that day. If you have a wireless device, the transmission will be sent automatically. After your physician reviews your transmission, you will receive a postcard with your next transmission date.  Dr Royann Shivers recommends  that you schedule a follow-up appointment in 6 months with a defibrillator check. You will receive a reminder letter in the mail two months in advance. If you don't receive a letter, please call our office to schedule the follow-up appointment.  If you need a refill on your cardiac medications before your next appointment, please call your pharmacy.      Signed, Thurmon Fair, MD  07/15/2016 10:29 AM    [facility] [address], [place], [place]  [address] Phone: [phone]; Fax: [phone]

## 2016-07-15 ENCOUNTER — Encounter: Payer: Self-pay | Admitting: Cardiovascular Disease

## 2016-07-17 ENCOUNTER — Ambulatory Visit: Payer: Self-pay | Admitting: Surgery

## 2016-07-17 NOTE — H&P (Signed)
Sheri Dunn 07/03/2016 3:32 PM Location: [facility] Patient #: [id number] DOB: Oct 30, 1950 Single / Language: [place] / Race: Black or African American Female   History of Present Illness Sheri Sportsman MD; 07/03/2016 6:26 PM) The patient is a 66 year old female who presents with an inguinal hernia. Note for "Inguinal hernia": Patient referred by Dr. Doylene Canard in our group for concern of increasing inguinal and umbilical hernias in a patient with right-sided heart failure.  Pleasant 66 year old female. Found to have significant cardiac declined. 100 ago stenting and was under severe heart failure. Sided with ascites. Paracentesis and diuresis done. Ejection fraction around 25%. Followed by Dr. Jomarie Longs with  medical group. Much more stable now. Swelling in both groins noted. Left side has a constant lump. Had CAT scan done last March that showed small bowel in a large right inguinal hernia. Medium size left inguinal hernias with abdominal wall seroma. Umbilical hernia as well. Cardiology sent to [place] to consider surgical repair. Initially discouraged from surgery given her cardiac function. Second opinion requested, so sent to me. The patient notes that occasionally they bother her but nothing too severe. He does not exercise to regularly but notes that she can walk several blocks without much difficulty. She's not on oxygen. She's not had a paracentesis since last year. Hernias are getting larger ago. She is no longer on blood thinners since her stents in 2016. Denies any abdominal surgeries.   Allergies (April Staton, CMA; 07/03/2016 3:32 PM) No Known Drug Allergies 04/21/2016  Medication History (April Staton, [place]; 07/03/2016 3:32 PM) Hernia Belt Double Small (1 (one) Misc daily, Taken starting 04/25/2016) Active. Atorvastatin Calcium (20MG  Tablet, Oral) Active. Carvedilol (12.5MG  Tablet, Oral) Active. Vitamin D3 (2000UNIT Tablet, Oral)  Active. Furosemide (40MG  Tablet, Oral) Active. Magnesium Oxide (400MG  Tablet, Oral) Active. MetOLazone (2.5MG  Tablet, Oral) Active. Potassium Chloride ER ( Tablet ER, Oral) Active. Spironolactone (25MG  Tablet, Oral) Active. Valsartan (160MG  Tablet, Oral) Active. Aspirin (81MG  Tablet, Oral) Active. Medications Reconciled  Vitals (April Staton CMA; 07/03/2016 3:33 PM) 07/03/2016 3:32 PM Weight: 127.13 lb Height: 66in Body Surface Area: 1.65 m Body Mass Index: 20.52 kg/m  Temp.: 97.62F(Oral)  Pulse: 68 (Regular)  BP: 108/80 (Sitting, Left Arm, Standard)       Physical Exam Sheri Sportsman MD; 07/03/2016 6:28 PM) General Mental Status-Alert. General Appearance-Not in acute distress, Not Sickly. Orientation-Oriented X3. Hydration-Well hydrated. Voice-Normal.  Integumentary Global Assessment Upon inspection and palpation of skin surfaces of the - Axillae: non-tender, no inflammation or ulceration, no drainage. and Distribution of scalp and body hair is normal. General Characteristics Temperature - normal warmth is noted.  Head and Neck Head-normocephalic, atraumatic with no lesions or palpable masses. Face Global Assessment - atraumatic, no absence of expression. Neck Global Assessment - no abnormal movements, no bruit auscultated on the right, no bruit auscultated on the left, no decreased range of motion, non-tender. Trachea-midline. Thyroid Gland Characteristics - non-tender.  Eye Eyeball - Left-Extraocular movements intact, No Nystagmus. Eyeball - Right-Extraocular movements intact, No Nystagmus. Cornea - Left-No Hazy. Cornea - Right-No Hazy. Sclera/Conjunctiva - Left-No scleral icterus, No Discharge. Sclera/Conjunctiva - Right-No scleral icterus, No Discharge. Pupil - Left-Direct reaction to light normal. Pupil - Right-Direct reaction to light normal.  ENMT Ears Pinna - Left - no drainage observed, no  generalized tenderness observed. Right - no drainage observed, no generalized tenderness observed. Nose and Sinuses External Inspection of the Nose - no destructive lesion observed. Inspection of the nares - Left - quiet respiration.  Right - quiet respiration. Mouth and Throat Lips - Upper Lip - no fissures observed, no pallor noted. Lower Lip - no fissures observed, no pallor noted. Nasopharynx - no discharge present. Oral Cavity/Oropharynx - Tongue - no dryness observed. Oral Mucosa - no cyanosis observed. Hypopharynx - no evidence of airway distress observed.  Chest and Lung Exam Inspection Movements - Normal and Symmetrical. Accessory muscles - No use of accessory muscles in breathing. Palpation Palpation of the chest reveals - Non-tender. Auscultation Breath sounds - Normal and Clear.  Cardiovascular Auscultation Rhythm - Regular. Murmurs & Other Heart Sounds - Auscultation of the heart reveals - No Murmurs and No Systolic Clicks.  Abdomen Inspection Inspection of the abdomen reveals - No Visible peristalsis and No Abnormal pulsations. Umbilicus - No Bleeding, No Urine drainage. Palpation/Percussion Palpation and Percussion of the abdomen reveal - Soft, Non Tender, No Rebound tenderness, No Rigidity (guarding) and No Cutaneous hyperesthesia. Note: Moderate sized diastases recti with 15 mm umbilical hernia through stock soft and reducible. Abdomen mildly distended but no major fluid wave. Certainly no major ascites. No guarding. No other abdominal wall hernias   Female Genitourinary Sexual Maturity Tanner 5 - Adult hair pattern. Note: Large right inguinal hernia 9 cm mass soft and easily reducible to more of a 2 cm defect consistent with inguinal hernia. Direct and indirect hernias small and reducible on the left side with a 8 x 4 cm ellipsoid firm but smooth mass consistent with chronic abdominal soreness trauma. That correlates with CAT scan. Seroma does not seem to be has  reducible.   Peripheral Vascular Upper Extremity Inspection - Left - No Cyanotic nailbeds, Not Ischemic. Right - No Cyanotic nailbeds, Not Ischemic.  Neurologic Neurologic evaluation reveals -normal attention span and ability to concentrate, able to name objects and repeat phrases. Appropriate fund of knowledge , normal sensation and normal coordination. Mental Status Affect - not angry, not paranoid. Cranial Nerves-Normal Bilaterally. Gait-Normal.  Neuropsychiatric Mental status exam performed with findings of-able to articulate well with normal speech/language, rate, volume and coherence, thought content normal with ability to perform basic computations and apply abstract reasoning and no evidence of hallucinations, delusions, obsessions or homicidal/suicidal ideation.  Musculoskeletal Global Assessment Spine, Ribs and Pelvis - no instability, subluxation or laxity. Right Upper Extremity - no instability, subluxation or laxity.  Lymphatic Head & Neck  General Head & Neck Lymphatics: Bilateral - Description - No Localized lymphadenopathy. Axillary  General Axillary Region: Bilateral - Description - No Localized lymphadenopathy. Femoral & Inguinal  Generalized Femoral & Inguinal Lymphatics: Left - Description - No Localized lymphadenopathy. Right - Description - No Localized lymphadenopathy.    Assessment & Plan Sheri Dunn(Takira Sherrin C. Sahithi Ordoyne MD; 07/03/2016 6:22 PM) BILATERAL INGUINAL HERNIA WITHOUT OBSTRUCTION OR GANGRENE, RECURRENCE NOT SPECIFIED (K40.20) Impression: Right greater than left bilateral inguinal hernias. The right side with small bowel within it. There are for the most part reducible. Complex subcutaneous fluid collection on left side most likely consistent with contiguous abdominal wall seroma.  Standard of care would be repair of hernias. Would do pre-peritoneal TEP approach to minimize incisions in the region. Would require general anesthesia. Would repair her  periumbilical hernia as well. Perhaps a Like underlay mesh repair as well.  Her biggest risk is heart failure and death. Her cardiologist is the one that sent her to us, so would like to see what Dr. Salena Saner thinks. She is not strongly interested to have surgery as they are not causing much in the way of pain  or discomfort, wants to hear what her cardiologist thinks before she comes to a decision. She does admit they're getting larger but was hoping there was a nonsurgical option. I did note these gradually get bigger over time. If they became incarcerated, an emergency where her risks of surgery of be markedly higher. Overall, I'm not going to force her to have surgery if she does not want it Current Plans I recommended obtaining preoperative cardiac clearance. I am concerned about the health of the patient and the ability to tolerate the operation. Therefore, we will request clearance by cardiology to better assess operative risk & see if a reevaluation, further workup, etc is needed. Also recommendations on how medications such as for anticoagulation and blood pressure should be managed/held/restarted after surgery. You would benefit from surgery to repair the hernias at your bellybutton and in both groins. This would require general anesthesia. Observation the hospital with cardiology to make sure recover safely.  However you're not having severe symptoms near hernias, so you can continue to observe if you wish. If you wish to consider surgery, would like input from her cardiologist first.  If you have not heard from our office [phone]) in 5 working days, call the office and ask for your surgeon's nurse.  If you have other questions about your diagnosis, plan, or surgery, call the office and ask for your surgeon's nurse.  UMBILICAL HERNIA WITHOUT OBSTRUCTION AND WITHOUT GANGRENE (K42.9) Impression: Small but definite umbilical hernia in the setting of diastases recti. If she undergoes  surgery, would plan repair of this as well. Most likely mesh underlay reinforcement DIASTASIS RECTI (M62.08) Current Plans Pt Education - [facility] Diastasis Recti: discussed with patient and provided information. GROIN FLUID COLLECTION (R18.8) Impression: Left groin fluid collection most likely some chronic seroma contiguously related to left direct/indirect chronic inguinal hernia. Would try and reduce or at least decompresses intra-abdominally to help better control it. Weight anything percutaneously if possible given her thin tissues. We'll hold off on doing anything if she declines surgery.  Patient evaluated by her cardiologist.  Wall at least a moderate to very increased cardiac risk, her cardiac disease is stabilized.  No major events recently.  We'll proceed with surgery patient wishes to remain aggressive understanding the increased operative risks  Sheri Dunn, M.D., F.A.C.S. Gastrointestinal and Minimally Invasive Surgery [facility] [address] [place], [place] [address] [phone] Main / Paging

## 2016-07-19 LAB — CUP PACEART INCLINIC DEVICE CHECK
Battery Remaining Longevity: 129 mo
Battery Voltage: 3.04 V
Brady Statistic RV Percent Paced: 0.01 %
Date Time Interrogation Session: 20180209212443
HighPow Impedance: 36 Ohm
Implantable Lead Implant Date: 20161122
Implantable Pulse Generator Implant Date: 20161122
Lead Channel Impedance Value: 399 Ohm
Lead Channel Sensing Intrinsic Amplitude: 14.125 mV
Lead Channel Setting Pacing Amplitude: 2 V
Lead Channel Setting Pacing Pulse Width: 0.4 ms
MDC IDC LEAD LOCATION: 753860
MDC IDC MSMT LEADCHNL RV IMPEDANCE VALUE: 285 Ohm
MDC IDC MSMT LEADCHNL RV PACING THRESHOLD AMPLITUDE: 0.5 V
MDC IDC MSMT LEADCHNL RV PACING THRESHOLD PULSEWIDTH: 0.4 ms
MDC IDC MSMT LEADCHNL RV SENSING INTR AMPL: 14.125 mV
MDC IDC SET LEADCHNL RV SENSING SENSITIVITY: 0.3 mV

## 2016-07-28 ENCOUNTER — Telehealth: Payer: Self-pay

## 2016-07-28 NOTE — Telephone Encounter (Signed)
-----   Message from Thurmon FairMihai Croitoru, MD sent at 07/15/2016  4:40 PM EST ----- I sent a surgical clearance letter to Dr. gross, but I think it should've been Dr. Andrey CampanileWilson. Can you please clarify for me who she saw for Gen. surgery? I can't find a note. Thanks [employer]

## 2016-07-28 NOTE — Telephone Encounter (Signed)
lmtcb

## 2016-07-31 NOTE — Telephone Encounter (Signed)
PATIENT AWARE

## 2016-07-31 NOTE — Telephone Encounter (Signed)
New Message    Pt returning phone regarding surgical clearance requesting a call back.

## 2016-07-31 NOTE — Telephone Encounter (Signed)
Follow up      Calling to let the nurse know that the doctor's name is Dr Star AgeStephen Gross

## 2016-07-31 NOTE — Telephone Encounter (Signed)
I sent it to Dr. Michaell CowingGross and spoke to him [facility]

## 2016-07-31 NOTE — Telephone Encounter (Signed)
Spoke with pt states that the DR is Dr Michaell CowingGross

## 2016-08-15 ENCOUNTER — Ambulatory Visit (INDEPENDENT_AMBULATORY_CARE_PROVIDER_SITE_OTHER): Payer: Medicare HMO

## 2016-08-15 DIAGNOSIS — Z95810 Presence of automatic (implantable) cardiac defibrillator: Secondary | ICD-10-CM

## 2016-08-15 DIAGNOSIS — I5042 Chronic combined systolic (congestive) and diastolic (congestive) heart failure: Secondary | ICD-10-CM | POA: Diagnosis not present

## 2016-08-17 ENCOUNTER — Telehealth: Payer: Self-pay

## 2016-08-17 NOTE — Progress Notes (Signed)
EPIC Encounter for ICM Monitoring  Patient Name: Sheri CurlsCarolyn Umali is a 66 y.o. female Date: 08/17/2016 Primary Care Physican: Katy ApoPOLITE,RONALD D, MD Primary Cardiologist:Croitoru Electrophysiologist: Croitoru Dry Weight:unknown      Attempted call to patient and unable to reach.  Left message to return call.  Transmission reviewed.    Thoracic impedance normal  Prescribed dosage: Furosemide 40 mg 2 tablets (80 mg total) once a day.  Metolazone 2.5 mg 1 tablet (2.5 mg total) by mouth 3 (three) times a week. 30 minutes before taking Lasix on Mondays, Wednesdays, and Fridays.  Recommendations: NONE - Unable to reach patient   Follow-up plan: ICM clinic phone appointment on 09/19/2016.  Copy of ICM check sent to device physician.   3 month ICM trend: 08/15/2016   1 Year ICM trend:      Karie SodaLaurie S Charlotta Lapaglia, RN 08/17/2016 1:42 PM

## 2016-08-17 NOTE — Progress Notes (Signed)
And thanks again MCr 

## 2016-08-17 NOTE — Telephone Encounter (Signed)
Remote ICM transmission received.  Attempted patient call and left message to return call.   

## 2016-09-05 ENCOUNTER — Other Ambulatory Visit: Payer: Self-pay

## 2016-09-18 ENCOUNTER — Other Ambulatory Visit: Payer: Self-pay | Admitting: Cardiovascular Disease

## 2016-09-19 ENCOUNTER — Other Ambulatory Visit (INDEPENDENT_AMBULATORY_CARE_PROVIDER_SITE_OTHER): Payer: Medicare HMO

## 2016-09-19 ENCOUNTER — Telehealth: Payer: Self-pay | Admitting: Cardiology

## 2016-09-19 ENCOUNTER — Ambulatory Visit (INDEPENDENT_AMBULATORY_CARE_PROVIDER_SITE_OTHER): Payer: Medicare HMO

## 2016-09-19 DIAGNOSIS — I5042 Chronic combined systolic (congestive) and diastolic (congestive) heart failure: Secondary | ICD-10-CM | POA: Diagnosis not present

## 2016-09-19 DIAGNOSIS — Z95810 Presence of automatic (implantable) cardiac defibrillator: Secondary | ICD-10-CM | POA: Diagnosis not present

## 2016-09-19 DIAGNOSIS — R945 Abnormal results of liver function studies: Secondary | ICD-10-CM

## 2016-09-19 DIAGNOSIS — R7989 Other specified abnormal findings of blood chemistry: Secondary | ICD-10-CM

## 2016-09-19 LAB — HEPATIC FUNCTION PANEL
ALK PHOS: 197 U/L — AB (ref 39–117)
ALT: 8 U/L (ref 0–35)
AST: 17 U/L (ref 0–37)
Albumin: 3.7 g/dL (ref 3.5–5.2)
BILIRUBIN DIRECT: 0.4 mg/dL — AB (ref 0.0–0.3)
BILIRUBIN TOTAL: 1.1 mg/dL (ref 0.2–1.2)
Total Protein: 7.1 g/dL (ref 6.0–8.3)

## 2016-09-19 NOTE — Telephone Encounter (Signed)
Spoke with pt and reminded pt of remote transmission that is due today. Pt verbalized understanding.   

## 2016-09-20 ENCOUNTER — Encounter (HOSPITAL_COMMUNITY): Payer: Self-pay

## 2016-09-20 ENCOUNTER — Encounter (HOSPITAL_COMMUNITY)
Admission: RE | Admit: 2016-09-20 | Discharge: 2016-09-20 | Disposition: A | Discharge status: Home / Self Care | Payer: Medicare HMO | Source: Ambulatory Visit | Attending: Surgery | Admitting: Surgery

## 2016-09-20 DIAGNOSIS — Z79899 Other long term (current) drug therapy: Secondary | ICD-10-CM | POA: Insufficient documentation

## 2016-09-20 DIAGNOSIS — K4020 Bilateral inguinal hernia, without obstruction or gangrene, not specified as recurrent: Secondary | ICD-10-CM | POA: Insufficient documentation

## 2016-09-20 DIAGNOSIS — I255 Ischemic cardiomyopathy: Secondary | ICD-10-CM | POA: Insufficient documentation

## 2016-09-20 DIAGNOSIS — I110 Hypertensive heart disease with heart failure: Secondary | ICD-10-CM | POA: Insufficient documentation

## 2016-09-20 DIAGNOSIS — E785 Hyperlipidemia, unspecified: Secondary | ICD-10-CM | POA: Insufficient documentation

## 2016-09-20 DIAGNOSIS — I5042 Chronic combined systolic (congestive) and diastolic (congestive) heart failure: Secondary | ICD-10-CM | POA: Diagnosis not present

## 2016-09-20 DIAGNOSIS — I252 Old myocardial infarction: Secondary | ICD-10-CM | POA: Insufficient documentation

## 2016-09-20 DIAGNOSIS — Z955 Presence of coronary angioplasty implant and graft: Secondary | ICD-10-CM | POA: Insufficient documentation

## 2016-09-20 DIAGNOSIS — Z7982 Long term (current) use of aspirin: Secondary | ICD-10-CM | POA: Diagnosis not present

## 2016-09-20 DIAGNOSIS — Z01812 Encounter for preprocedural laboratory examination: Secondary | ICD-10-CM | POA: Diagnosis not present

## 2016-09-20 DIAGNOSIS — Z01818 Encounter for other preprocedural examination: Secondary | ICD-10-CM | POA: Insufficient documentation

## 2016-09-20 DIAGNOSIS — Z95810 Presence of automatic (implantable) cardiac defibrillator: Secondary | ICD-10-CM | POA: Insufficient documentation

## 2016-09-20 DIAGNOSIS — I2510 Atherosclerotic heart disease of native coronary artery without angina pectoris: Secondary | ICD-10-CM | POA: Diagnosis not present

## 2016-09-20 HISTORY — DX: Dyspnea, unspecified: R06.00

## 2016-09-20 LAB — CBC
HCT: 37.1 % (ref 36.0–46.0)
HEMOGLOBIN: 12.2 g/dL (ref 12.0–15.0)
MCH: 27.5 pg (ref 26.0–34.0)
MCHC: 32.9 g/dL (ref 30.0–36.0)
MCV: 83.6 fL (ref 78.0–100.0)
PLATELETS: 165 10*3/uL (ref 150–400)
RBC: 4.44 MIL/uL (ref 3.87–5.11)
RDW: 17.4 % — ABNORMAL HIGH (ref 11.5–15.5)
WBC: 5.2 10*3/uL (ref 4.0–10.5)

## 2016-09-20 LAB — BASIC METABOLIC PANEL
ANION GAP: 9 (ref 5–15)
BUN: 24 mg/dL — ABNORMAL HIGH (ref 6–20)
CALCIUM: 8.7 mg/dL — AB (ref 8.9–10.3)
CO2: 22 mmol/L (ref 22–32)
CREATININE: 0.9 mg/dL (ref 0.44–1.00)
Chloride: 106 mmol/L (ref 101–111)
GFR calc non Af Amer: >60 mL/min (ref >60)
GLUCOSE: 135 mg/dL — AB (ref 65–99)
Potassium: 4.1 mmol/L (ref 3.5–5.1)
Sodium: 137 mmol/L (ref 135–145)

## 2016-09-20 NOTE — Progress Notes (Signed)
Leta Jungling with [employer] made aware of surgery date and time Device form faxed and received back it is shadow chart  PCP - Dr. Conservation officer, historic buildings Cardiologist - Cortiuro  Chest x-ray - not needed EKG - 04/12/16 Stress Test - > 20 years ECHO - 12/11/14 Cardiac Cath -09/08/14      Patient denies shortness of breath, fever, cough and chest pain at PAT appointment   Patient verbalized understanding of instructions that was given to them at the PAT appointment. Patient expressed that there were no further questions.  Patient was also instructed that they will need to review over the PAT instructions again at home before the surgery.

## 2016-09-20 NOTE — Pre-Procedure Instructions (Addendum)
Sheri Dunn  09/20/2016      [facility] - [place], [place] - [address] [place] [address] Phone: [phone] Fax: [phone]  [facility] - [place], [place] - [address] [place] [address] Phone: [phone] Fax: [phone]    Your procedure is scheduled on April 26  Report to [facility] Admitting at 0530 A.M.  Call this number if you have problems the morning of surgery:  [phone]   Remember:  Do not eat food or drink liquids after midnight. Except follow the following instructions  Please complete your 8oz of Boost Breeze or Water that was given to you at your preadmission appointment by 330 am on the day of your surgery   Take these medicines the morning of surgery with A SIP OF WATER potassium chloride (K-DUR),  carvedilol (COREG),   Ok to Continue Aspirin per Dr. Michaell Cowing Order   Take all other medications as prescribed except 7 days prior to surgery STOP taking any  Aleve, Naproxen, Ibuprofen, Motrin, Advil, Goody's, BC's, all herbal medications, fish oil, and all vitamins    Do not wear jewelry, make-up or nail polish.  Do not wear lotions, powders, or perfumes, or deoderant.  Do not shave 48 hours prior to surgery.  Men may shave face and neck.  Do not bring valuables to the hospital.  [facility] is not responsible for any belongings or valuables.  Contacts, dentures or bridgework may not be worn into surgery.  Leave your suitcase in the car.  After surgery it may be brought to your room.  For patients admitted to the hospital, discharge time will be determined by your treatment team.  Patients discharged the day of surgery will not be allowed to drive home.    Special instructions:   [place]- Preparing For Surgery  Before surgery, you can play an important role. Because skin is not sterile, your skin needs  to be as free of germs as possible. You can reduce the number of germs on your skin by washing with CHG (chlorahexidine gluconate) Soap before surgery.  CHG is an antiseptic cleaner which kills germs and bonds with the skin to continue killing germs even after washing.  Please do not use if you have an allergy to CHG or antibacterial soaps. If your skin becomes reddened/irritated stop using the CHG.  Do not shave (including legs and underarms) for at least 48 hours prior to first CHG shower. It is OK to shave your face.  Please follow these instructions carefully.   1. Shower the NIGHT BEFORE SURGERY and the MORNING OF SURGERY with CHG.   2. If you chose to wash your hair, wash your hair first as usual with your normal shampoo.  3. After you shampoo, rinse your hair and body thoroughly to remove the shampoo.  4. Use CHG as you would any other liquid soap. You can apply CHG directly to the skin and wash gently with a scrungie or a clean washcloth.   5. Apply the CHG Soap to your body ONLY FROM THE NECK DOWN.  Do not use on open wounds or open sores. Avoid contact with your eyes, ears, mouth and genitals (private parts). Wash genitals (private parts) with your normal soap.  6. Wash thoroughly, paying special attention to the area where your surgery will be performed.  7. Thoroughly rinse your body with warm water from the neck down.  8. DO NOT shower/wash with your normal soap after using and rinsing off the CHG Soap.  9. Pat yourself dry with a CLEAN TOWEL.   10. Wear CLEAN PAJAMAS   11. Place CLEAN SHEETS on your bed the night of your first shower and DO NOT SLEEP WITH PETS.    Day of Surgery: Do not apply any deodorants/lotions. Please wear clean clothes to the hospital/surgery center.      Please read over the following fact sheets that you were given.

## 2016-09-21 NOTE — Progress Notes (Signed)
EPIC Encounter for ICM Monitoring  Patient Name: Sheri Dunn is a 66 y.o. female Date: 09/21/2016 Primary Care Physican: Katy Apo, MD Primary Cardiologist:Croitoru Electrophysiologist: Croitoru Dry Weight:unknown      Heart Failure questions reviewed, pt asymptomatic.  Will have 5 hernia repairs next week.     Thoracic impedance normal.  Prescribed dosage: Furosemide 40 mg 2 tablets (80 mg total) once a day.  Metolazone 2.5 mg 1 tablet (2.5 mg total) by mouth 3 (three) times a week. 30 minutes before taking Lasix on Mondays, Wednesdays, and Fridays.  Recommendations: No changes. Discussed to limit salt intake to 2000 mg/day and fluid intake to < 2 liters/day.  Encouraged to call for fluid symptoms.  Follow-up plan: ICM clinic phone appointment on 10/24/2016.    Copy of ICM check sent to cardiologist and device physician.   3 month ICM trend: 09/19/2016   1 Year ICM trend:      Karie Soda, RN 09/21/2016 1:03 PM

## 2016-09-21 NOTE — Progress Notes (Addendum)
Will schedule ICM remote transmission after patient has hernia surgery, 09/28/2016, to monitor for fluid accumulation.  Next ICM remote transmission scheduled for 10/02/2016.

## 2016-09-21 NOTE — Progress Notes (Signed)
Thank you, be prepared to see it shoot up after her surgery MCr

## 2016-09-22 ENCOUNTER — Other Ambulatory Visit: Payer: Self-pay

## 2016-09-22 DIAGNOSIS — R748 Abnormal levels of other serum enzymes: Secondary | ICD-10-CM

## 2016-09-22 NOTE — Progress Notes (Signed)
Anesthesia Chart Review:  Pt is a 66 year old female scheduled for laparoscopic B inguinal hernia repairs with mesh, laparoscopic ventral hernia repair on 09/28/2016 with Karie Soda, M.D.  - Renford Dills, MD - Cardiologist is Thurmon Fair, MD who cleared pt for surgery at moderate risk. Last office visit 07/14/16.  PMH includes: CAD (NSTEMI and DES to CX, DES to RCA 2016), ischemic cardiomyopathy, chronic combined systolic and diastolic CHF, AICD (Medtronic, implanted 04/27/15), HTN, hyperlipidemia. Never smoker. BMI 22.   Medications include: ASA 81 mg, Lipitor, carvedilol, Lasix, metolazone, potassium, spironolactone, valsartan.  Preoperative labs reviewed.  EKG 04/12/16: Sinus rhythm with occasional PVCs. Septal infarct, age undetermined. ST and T-wave abnormality, consider inferior lateral ischemia.  Echo 03/31/16: - Left ventricle: The cavity size was mildly dilated. Wall thickness was normal. Systolic function was severely reduced. The estimated ejection fraction was in the range of 20% to 25%. Severe diffuse hypokinesis. Features are consistent with a pseudonormal left ventricular filling pattern, with concomitant abnormal relaxation and increased filling pressure (grade 2 diastolic dysfunction). No evidence of thrombus. - Aortic valve: There was moderate regurgitation. - Mitral valve: There was moderate regurgitation. - Left atrium: The atrium was severely dilated. - Right ventricle: The cavity size was moderately dilated. Systolic function was moderately reduced. - Right atrium: The atrium was severely dilated. - Tricuspid valve: There was moderate regurgitation. - Pulmonary arteries: Systolic pressure was mildly to moderately increased. PA peak pressure: 47 mm Hg (S). - Pericardium, extracardiac: A trivial pericardial effusion was identified.  Cardiac cath 09/08/14:  1. Normal LM. 2. Mild disease in the LAD and its branches. 3. Severe disease in the mid LCX which was  successfully treated with a 3.5 x 16 Synergy DES, postdilated to 3.8 mm. 4. Severe disease in the distal RCA which was successfully treated with a 3.0 x 16 Synergy DES, postdilated to 3.3 mm. 5. Severely decreased left ventricular systolic function.  LVEDP 10 mmHg.  Ejection fraction 20%.  Perioperative prescription form for ICD notes procedure may interfere with device function. Magnet should be placed over device during procedure. Postop interrogation is not needed.  If no changes, I anticipate pt can proceed with surgery as scheduled.   Rica Mast, FNP-BC [facility] Short Stay Surgical Center/Anesthesiology Phone: [phone] 09/22/2016 1:58 PM

## 2016-09-27 ENCOUNTER — Encounter (HOSPITAL_COMMUNITY): Payer: Self-pay | Admitting: Anesthesiology

## 2016-09-27 MED ORDER — BUPIVACAINE LIPOSOME 1.3 % IJ SUSP
20.0000 mL | INTRAMUSCULAR | Status: AC
  Administered 2016-09-28: 20 mL
  Filled 2016-09-27: qty 20

## 2016-09-27 NOTE — Anesthesia Preprocedure Evaluation (Addendum)
Anesthesia Evaluation  Patient identified by MRN, date of birth, ID band Patient awake    Reviewed: Allergy & Precautions, NPO status , Patient's Chart, lab work & pertinent test results, reviewed documented beta blocker date and time   Airway Mallampati: II       Dental  (+) Upper Dentures, Poor Dentition, Loose, Missing,    Pulmonary    Pulmonary exam normal breath sounds clear to auscultation       Cardiovascular hypertension, Pt. on home beta blockers and Pt. on medications Normal cardiovascular exam Rhythm:Regular Rate:Normal     Neuro/Psych negative psych ROS   GI/Hepatic negative GI ROS, Neg liver ROS,   Endo/Other    Renal/GU negative Renal ROS  negative genitourinary   Musculoskeletal negative musculoskeletal ROS (+)   Abdominal Normal abdominal exam  (+)   Peds  Hematology   Anesthesia Other Findings Study Result   Result status: Final result                          [facility]                        [address]                        [place], [place] [address]                            [phone]  ------------------------------------------------------------------- Transthoracic Echocardiography  Patient:    Sheri Dunn, Sheri Dunn MR #:       [id number] Study Date: 03/31/2016 Gender:     F Age:        66 Height:     167.6 cm Weight:     54.3 kg BSA:        1.58 m^2 Pt. Status: Room:   SONOGRAPHER  Dewitt Hoes, RDCS  ATTENDING    Thurmon Fair, MD  ORDERING     Thurmon Fair, MD  REFERRING    Thurmon Fair, MD  PERFORMING   [facility], Outpatient  cc:  ------------------------------------------------------------------- LV EF: 20% -   25%  ------------------------------------------------------------------- Indications:      Aortic insufficiency (I35.1).  ------------------------------------------------------------------- History:   PMH:   Atrial flutter.  Coronary  artery disease. Congestive heart failure.  Ischemic cardiomyopathy.  PMH: Myocardial infarction.  Risk factors:  Cardiac contusion. Hypertension. Dyslipidemia.  ------------------------------------------------------------------- Study Conclusions  - Left ventricle: The cavity size was mildly dilated. Wall   thickness was normal. Systolic function was severely reduced. The   estimated ejection fraction was in the range of 20% to 25%.   Severe diffuse hypokinesis. Features are consistent with a   pseudonormal left ventricular filling pattern, with concomitant   abnormal relaxation and increased filling pressure (grade 2   diastolic dysfunction). No evidence of thrombus. - Aortic valve: There was moderate regurgitation. - Mitral valve: There was moderate regurgitation. - Left atrium: The atrium was severely dilated. - Right ventricle: The cavity size was moderately dilated. Systolic   function was moderately reduced. - Right atrium: The atrium was severely dilated. - Tricuspid valve: There was moderate regurgitation. - Pulmonary arteries: Systolic pressure was mildly to moderately   increased. PA peak pressure: 47 mm Hg (S). - Pericardium, extracardiac: A trivial pericardial effusion was   identified.  ------------------------------------------------------------------- Labs, prior tests, procedures, and surgery: Transthoracic echocardiography (12/11/2014).  EF was 25%.  ICD system implantation.  ------------------------------------------------------------------- Study data:  Comparison was made to the study of 12/11/2014.  Study status:  Routine.  Procedure:  The patient reported no pain pre or post test. Transthoracic echocardiography. Image quality was adequate.  Study completion:  There were no complications. Transthoracic echocardiography.  M-mode, complete 2D, spectral Doppler, and color Doppler.  Birthdate:  Patient birthdate: 1951/05/26.  Age:  Patient is 66 yr  old.  Sex:  Gender: female. BMI: 19.3 kg/m^2.  Blood pressure:     100/60  Patient status: Outpatient.  Study date:  Study date: 03/31/2016. Study time: 01:51 PM.  Location:  [facility]  -------------------------------------------------------------------  ------------------------------------------------------------------- Left ventricle:  The cavity size was mildly dilated. Wall thickness was normal. Systolic function was severely reduced. The estimated ejection fraction was in the range of 20% to 25%.  Severe diffuse hypokinesis.  No evidence of thrombus. Features are consistent with a pseudonormal left ventricular filling pattern, with concomitant abnormal relaxation and increased filling pressure (grade 2 diastolic dysfunction).  ------------------------------------------------------------------- Aortic valve:   Structurally normal valve. Trileaflet. Cusp separation was normal.  Doppler:  Transvalvular velocity was within the normal range. There was no stenosis. There was moderate regurgitation.  ------------------------------------------------------------------- Aorta:  Aortic root: The aortic root was normal in size. Ascending aorta: The ascending aorta was normal in size.  ------------------------------------------------------------------- Mitral valve:   Mildly thickened leaflets . Leaflet separation was normal.  Doppler:  Transvalvular velocity was within the normal range. There was no evidence for stenosis. There was moderate regurgitation.  ------------------------------------------------------------------- Left atrium:  The atrium was severely dilated.  ------------------------------------------------------------------- Right ventricle:  The cavity size was moderately dilated. Pacer wire or catheter noted in right ventricle. Systolic function was moderately reduced.  ------------------------------------------------------------------- Pulmonic valve:     Structurally normal valve.   Cusp separation was normal.  Doppler:  Transvalvular velocity was within the normal range. There was mild regurgitation.  ------------------------------------------------------------------- Tricuspid valve:   Structurally normal valve.   Leaflet separation was normal.  Doppler:  Transvalvular velocity was within the normal range. There was moderate regurgitation.  ------------------------------------------------------------------- Pulmonary artery:   Systolic pressure was mildly to moderately increased.  ------------------------------------------------------------------- Right atrium:  The atrium was severely dilated.  ------------------------------------------------------------------- Pericardium:  A trivial pericardial effusion was identified.  ------------------------------------------------------------------- Systemic veins: Inferior vena cava: The vessel was dilated. The respirophasic diameter changes were blunted (< 50%), consistent with elevated central venous pressure.  ------------------------------------------------------------------- Measurements   Left ventricle                           Value        Reference  LV ID, ED, PLAX chordal          (H)     64.8  mm     43 - 52  LV ID, ES, PLAX chordal          (H)     59.4  mm     23 - 38  LV fx shortening, PLAX chordal   (L)     8     %      >=29  LV PW thickness, ED                      9     mm     ---------  IVS/LV PW ratio, ED  0.97         <=1.3  Stroke volume, 2D                        57    ml     ---------  Stroke volume/bsa, 2D                    36    ml/m^2 ---------  LV e&', lateral                           7.71  cm/s   ---------  LV E/e&', lateral                         5.18         ---------  LV e&', medial                            2.64  cm/s   ---------  LV E/e&', medial                          15.11        ---------  LV e&', average                            5.18  cm/s   ---------  LV E/e&', average                         7.71         ---------    Ventricular septum                       Value        Reference  IVS thickness, ED                        8.7   mm     ---------    LVOT                                     Value        Reference  LVOT ID, S                               21    mm     ---------  LVOT area                                3.46  cm^2   ---------  LVOT peak velocity, S                    80.6  cm/s   ---------  LVOT mean velocity, S                    50.4  cm/s   ---------  LVOT VTI, S                              16.6  cm     ---------    Aortic valve                             Value        Reference  Aortic regurg pressure half-time         370   ms     ---------    Aorta                                    Value        Reference  Aortic root ID, ED                       32    mm     ---------    Left atrium                              Value        Reference  LA ID, A-P, ES                           50    mm     ---------  LA ID/bsa, A-P                   (H)     3.16  cm/m^2 <=2.2  LA volume, S                             115   ml     ---------  LA volume/bsa, S                         72.6  ml/m^2 ---------  LA volume, ES, 1-p A4C                   92.7  ml     ---------  LA volume/bsa, ES, 1-p A4C               58.5  ml/m^2 ---------  LA volume, ES, 1-p A2C                   129   ml     ---------  LA volume/bsa, ES, 1-p A2C               81.4  ml/m^2 ---------    Mitral valve                             Value        Reference  Mitral E-wave peak velocity              39.9  cm/s   ---------  Mitral A-wave peak velocity              26    cm/s   ---------  Mitral deceleration time         (L)     144   ms     150 - 230  Mitral E/A ratio, peak                   1.5          ---------  Pulmonary arteries                       Value        Reference  PA pressure, S, DP               (H)      47    mm Hg  <=30    Tricuspid valve                          Value        Reference  Tricuspid regurg peak velocity           284   cm/s   ---------  Tricuspid peak RV-RA gradient            32    mm Hg  ---------    Systemic veins                           Value        Reference  Estimated CVP                            15    mm Hg  ---------    Right ventricle                          Value        Reference  RV pressure, S, DP               (H)     47    mm Hg  <=30  RV s&', lateral, S                        3.83  cm/s   ---------    Pulmonic valve                           Value        Reference  Pulmonic regurg velocity, ED             104   cm/s   ---------  Legend: (L)  and  (H)  mark values outside specified reference range.  ------------------------------------------------------------------- Prepared and Electronically Authenticated by  Thurmon Fair, MD 2017-10-27T16:49:46 PACS Images   Show images for ECHOCARDIOGRAM COMPLETE Patient Information   Patient Name Sheri Dunn, Sheri Dunn Sex Female DOB 08/24/1950 SSN [id number] Reason For Exam  Priority: Routine  Dx: Aortic insufficiency (I35.1 (ICD-10-CM)) Surgical History   Surgical History    Procedure Laterality Date Comment Source CARDIAC CATHETERIZATION Right 09/08/2014 Procedure: CORONARY STENT INTERVENTION; Surgeon: Corky Crafts, MD; Location: [facility] CATH LAB; Service: Cardiovascular; Laterality: Right; Provider  Other Surgical History    Procedure Laterality Date Comment Source COLONOSCOPY    Provider EP IMPLANTABLE DEVICE N/A 04/27/2015 Procedure: ICD Implant; Surgeon: Thurmon Fair, MD; Medtronic Visia AF, Model DVAB1D4 serial number [id number] [facility]  Provider LEFT HEART CATHETERIZATION WITH CORONARY ANGIOGRAM N/A 09/08/2014 Procedure: LEFT HEART CATHETERIZATION WITH CORONARY ANGIOGRAM; Surgeon: Corky Crafts, MD; Location: [facility] CATH LAB; Service: Cardiovascular; Laterality:  N/A; Provider  Performing Technologist/Nurse   Performing Technologist/Nurse: Yolande Jolly, RDCS          Implants     ICD Generator Visia Af Vr Dvab1d4 - [id number] - Implanted  (  Left)    Inventory item: Visia Af Vr Dvab1d4 Model/Cat number: DVAB1D4 Serial number: [id number] Manufacturer: [employer] Device identifier: [id number] Device identifier type: GS1 As of 04/27/2015     Status: Implanted    Implant Merm Dvab1d4 Visia Af Vr [id number] - Implanted     Model/Cat number: DVAB1D4 VISIA AF VR Serial number: [id number] Manufacturer: [employer]   As of 07/14/2016     Status: Implanted    Merm 6935m Sprint Quattro Secure S [id number] - Implanted     Model/Cat number: 8413K SPRINT Alvina Chou S Serial number: [id number] Manufacturer: [employer]   As of 12/21/2015     Status: Implanted    Order-Level Documents:   There are no order-level documents.  Encounter-Level Documents - 03/31/2016:   Electronic signature on 03/31/2016 1:50 PM  Electronic signature on 03/31/2016 1:50 PM    Signed   Electronically signed by Thurmon Fair, MD on 03/31/16 at 1649 EDT Printable Result Report    Result Report  External Result Report    External Result Report     Reproductive/Obstetrics                          Anesthesia Physical Anesthesia Plan  ASA: IV  Anesthesia Plan: General   Post-op Pain Management:    Induction: Intravenous  Airway Management Planned: Oral ETT  Additional Equipment:   Intra-op Plan:   Post-operative Plan: Extubation in OR  Informed Consent: I have reviewed the patients History and Physical, chart, labs and discussed the procedure including the risks, benefits and alternatives for the proposed anesthesia with the patient or authorized representative who has indicated his/her understanding and acceptance.     Plan Discussed with: CRNA and  Surgeon  Anesthesia Plan Comments:         Anesthesia Quick Evaluation

## 2016-09-28 ENCOUNTER — Ambulatory Visit (HOSPITAL_COMMUNITY): Payer: Medicare HMO | Admitting: Anesthesiology

## 2016-09-28 ENCOUNTER — Encounter (HOSPITAL_COMMUNITY)
Admission: AD | Disposition: A | Discharge status: Home / Self Care | Payer: Self-pay | Source: Ambulatory Visit | Attending: Surgery

## 2016-09-28 ENCOUNTER — Encounter (HOSPITAL_COMMUNITY): Payer: Self-pay | Admitting: *Deleted

## 2016-09-28 ENCOUNTER — Inpatient Hospital Stay (HOSPITAL_COMMUNITY): Payer: Medicare HMO

## 2016-09-28 ENCOUNTER — Ambulatory Visit (HOSPITAL_COMMUNITY): Payer: Medicare HMO | Admitting: Emergency Medicine

## 2016-09-28 ENCOUNTER — Inpatient Hospital Stay (HOSPITAL_COMMUNITY)
Admission: AD | Admit: 2016-09-28 | Discharge: 2016-10-08 | DRG: 350 | Disposition: A | Discharge status: Home / Self Care | Payer: Medicare HMO | Source: Ambulatory Visit | Attending: Cardiovascular Disease | Admitting: Cardiovascular Disease

## 2016-09-28 DIAGNOSIS — A419 Sepsis, unspecified organism: Secondary | ICD-10-CM | POA: Diagnosis not present

## 2016-09-28 DIAGNOSIS — D62 Acute posthemorrhagic anemia: Secondary | ICD-10-CM | POA: Diagnosis not present

## 2016-09-28 DIAGNOSIS — Z8249 Family history of ischemic heart disease and other diseases of the circulatory system: Secondary | ICD-10-CM

## 2016-09-28 DIAGNOSIS — N179 Acute kidney failure, unspecified: Secondary | ICD-10-CM | POA: Diagnosis not present

## 2016-09-28 DIAGNOSIS — I2510 Atherosclerotic heart disease of native coronary artery without angina pectoris: Secondary | ICD-10-CM | POA: Diagnosis not present

## 2016-09-28 DIAGNOSIS — J9811 Atelectasis: Secondary | ICD-10-CM | POA: Diagnosis not present

## 2016-09-28 DIAGNOSIS — J810 Acute pulmonary edema: Secondary | ICD-10-CM | POA: Diagnosis not present

## 2016-09-28 DIAGNOSIS — R269 Unspecified abnormalities of gait and mobility: Secondary | ICD-10-CM | POA: Diagnosis not present

## 2016-09-28 DIAGNOSIS — R109 Unspecified abdominal pain: Secondary | ICD-10-CM | POA: Diagnosis not present

## 2016-09-28 DIAGNOSIS — I5082 Biventricular heart failure: Secondary | ICD-10-CM | POA: Diagnosis present

## 2016-09-28 DIAGNOSIS — G9340 Encephalopathy, unspecified: Secondary | ICD-10-CM | POA: Diagnosis not present

## 2016-09-28 DIAGNOSIS — T4145XA Adverse effect of unspecified anesthetic, initial encounter: Secondary | ICD-10-CM | POA: Diagnosis not present

## 2016-09-28 DIAGNOSIS — Z955 Presence of coronary angioplasty implant and graft: Secondary | ICD-10-CM | POA: Diagnosis not present

## 2016-09-28 DIAGNOSIS — I509 Heart failure, unspecified: Secondary | ICD-10-CM

## 2016-09-28 DIAGNOSIS — R7989 Other specified abnormal findings of blood chemistry: Secondary | ICD-10-CM | POA: Diagnosis present

## 2016-09-28 DIAGNOSIS — I110 Hypertensive heart disease with heart failure: Secondary | ICD-10-CM | POA: Diagnosis not present

## 2016-09-28 DIAGNOSIS — R6521 Severe sepsis with septic shock: Secondary | ICD-10-CM | POA: Diagnosis not present

## 2016-09-28 DIAGNOSIS — I5042 Chronic combined systolic (congestive) and diastolic (congestive) heart failure: Secondary | ICD-10-CM | POA: Diagnosis present

## 2016-09-28 DIAGNOSIS — I5043 Acute on chronic combined systolic (congestive) and diastolic (congestive) heart failure: Secondary | ICD-10-CM | POA: Diagnosis present

## 2016-09-28 DIAGNOSIS — K458 Other specified abdominal hernia without obstruction or gangrene: Secondary | ICD-10-CM

## 2016-09-28 DIAGNOSIS — Z833 Family history of diabetes mellitus: Secondary | ICD-10-CM | POA: Diagnosis not present

## 2016-09-28 DIAGNOSIS — I213 ST elevation (STEMI) myocardial infarction of unspecified site: Secondary | ICD-10-CM | POA: Diagnosis not present

## 2016-09-28 DIAGNOSIS — Z9861 Coronary angioplasty status: Secondary | ICD-10-CM | POA: Diagnosis not present

## 2016-09-28 DIAGNOSIS — J9601 Acute respiratory failure with hypoxia: Secondary | ICD-10-CM | POA: Diagnosis not present

## 2016-09-28 DIAGNOSIS — K4000 Bilateral inguinal hernia, with obstruction, without gangrene, not specified as recurrent: Secondary | ICD-10-CM | POA: Diagnosis not present

## 2016-09-28 DIAGNOSIS — Z823 Family history of stroke: Secondary | ICD-10-CM | POA: Diagnosis not present

## 2016-09-28 DIAGNOSIS — Z95810 Presence of automatic (implantable) cardiac defibrillator: Secondary | ICD-10-CM | POA: Diagnosis not present

## 2016-09-28 DIAGNOSIS — I083 Combined rheumatic disorders of mitral, aortic and tricuspid valves: Secondary | ICD-10-CM | POA: Diagnosis present

## 2016-09-28 DIAGNOSIS — J9691 Respiratory failure, unspecified with hypoxia: Secondary | ICD-10-CM

## 2016-09-28 DIAGNOSIS — E785 Hyperlipidemia, unspecified: Secondary | ICD-10-CM | POA: Diagnosis present

## 2016-09-28 DIAGNOSIS — R339 Retention of urine, unspecified: Secondary | ICD-10-CM | POA: Diagnosis not present

## 2016-09-28 DIAGNOSIS — I959 Hypotension, unspecified: Secondary | ICD-10-CM | POA: Diagnosis not present

## 2016-09-28 DIAGNOSIS — I4581 Long QT syndrome: Secondary | ICD-10-CM | POA: Diagnosis not present

## 2016-09-28 DIAGNOSIS — I5020 Unspecified systolic (congestive) heart failure: Secondary | ICD-10-CM | POA: Diagnosis not present

## 2016-09-28 DIAGNOSIS — K429 Umbilical hernia without obstruction or gangrene: Secondary | ICD-10-CM

## 2016-09-28 DIAGNOSIS — I252 Old myocardial infarction: Secondary | ICD-10-CM | POA: Diagnosis not present

## 2016-09-28 DIAGNOSIS — E876 Hypokalemia: Secondary | ICD-10-CM | POA: Diagnosis not present

## 2016-09-28 DIAGNOSIS — R188 Other ascites: Secondary | ICD-10-CM | POA: Diagnosis not present

## 2016-09-28 DIAGNOSIS — I10 Essential (primary) hypertension: Secondary | ICD-10-CM | POA: Diagnosis present

## 2016-09-28 DIAGNOSIS — I255 Ischemic cardiomyopathy: Secondary | ICD-10-CM | POA: Diagnosis present

## 2016-09-28 DIAGNOSIS — K5909 Other constipation: Secondary | ICD-10-CM

## 2016-09-28 DIAGNOSIS — R778 Other specified abnormalities of plasma proteins: Secondary | ICD-10-CM | POA: Diagnosis present

## 2016-09-28 DIAGNOSIS — K5900 Constipation, unspecified: Secondary | ICD-10-CM | POA: Diagnosis not present

## 2016-09-28 DIAGNOSIS — Z7189 Other specified counseling: Secondary | ICD-10-CM | POA: Diagnosis not present

## 2016-09-28 DIAGNOSIS — I361 Nonrheumatic tricuspid (valve) insufficiency: Secondary | ICD-10-CM | POA: Diagnosis not present

## 2016-09-28 DIAGNOSIS — K4020 Bilateral inguinal hernia, without obstruction or gangrene, not specified as recurrent: Principal | ICD-10-CM | POA: Diagnosis present

## 2016-09-28 DIAGNOSIS — I2119 ST elevation (STEMI) myocardial infarction involving other coronary artery of inferior wall: Secondary | ICD-10-CM | POA: Diagnosis not present

## 2016-09-28 DIAGNOSIS — Z7982 Long term (current) use of aspirin: Secondary | ICD-10-CM | POA: Diagnosis not present

## 2016-09-28 DIAGNOSIS — K439 Ventral hernia without obstruction or gangrene: Secondary | ICD-10-CM | POA: Diagnosis not present

## 2016-09-28 DIAGNOSIS — J439 Emphysema, unspecified: Secondary | ICD-10-CM | POA: Diagnosis not present

## 2016-09-28 DIAGNOSIS — K7460 Unspecified cirrhosis of liver: Secondary | ICD-10-CM | POA: Insufficient documentation

## 2016-09-28 DIAGNOSIS — K4090 Unilateral inguinal hernia, without obstruction or gangrene, not specified as recurrent: Secondary | ICD-10-CM

## 2016-09-28 DIAGNOSIS — I119 Hypertensive heart disease without heart failure: Secondary | ICD-10-CM | POA: Diagnosis present

## 2016-09-28 DIAGNOSIS — I351 Nonrheumatic aortic (valve) insufficiency: Secondary | ICD-10-CM | POA: Diagnosis present

## 2016-09-28 DIAGNOSIS — I5021 Acute systolic (congestive) heart failure: Secondary | ICD-10-CM | POA: Diagnosis not present

## 2016-09-28 HISTORY — DX: Contusion of heart, unspecified with or without hemopericardium, initial encounter: S26.91XA

## 2016-09-28 HISTORY — PX: VENTRAL HERNIA REPAIR: SHX424

## 2016-09-28 HISTORY — PX: INGUINAL HERNIA REPAIR: SHX194

## 2016-09-28 HISTORY — DX: Non-ST elevation (NSTEMI) myocardial infarction: I21.4

## 2016-09-28 HISTORY — PX: INSERTION OF MESH: SHX5868

## 2016-09-28 HISTORY — DX: Chest pain, unspecified: R07.9

## 2016-09-28 HISTORY — DX: Acute on chronic combined systolic (congestive) and diastolic (congestive) heart failure: I50.43

## 2016-09-28 HISTORY — DX: Unspecified atrial flutter: I48.92

## 2016-09-28 LAB — COMPREHENSIVE METABOLIC PANEL
ALBUMIN: 2.8 g/dL — AB (ref 3.5–5.0)
ALK PHOS: 90 U/L (ref 38–126)
ALT: 11 U/L — ABNORMAL LOW (ref 14–54)
ANION GAP: 7 (ref 5–15)
AST: 20 U/L (ref 15–41)
BUN: 29 mg/dL — AB (ref 6–20)
CALCIUM: 8.3 mg/dL — AB (ref 8.9–10.3)
CO2: 20 mmol/L — AB (ref 22–32)
Chloride: 109 mmol/L (ref 101–111)
Creatinine, Ser: 1.04 mg/dL — ABNORMAL HIGH (ref 0.44–1.00)
GFR calc Af Amer: >60 mL/min (ref >60)
GFR calc non Af Amer: 54 mL/min — ABNORMAL LOW (ref >60)
GLUCOSE: 90 mg/dL (ref 65–99)
Potassium: 4.2 mmol/L (ref 3.5–5.1)
SODIUM: 136 mmol/L (ref 135–145)
Total Bilirubin: 1.5 mg/dL — ABNORMAL HIGH (ref 0.3–1.2)
Total Protein: 4.6 g/dL — ABNORMAL LOW (ref 6.5–8.1)

## 2016-09-28 LAB — TROPONIN I
Troponin I: 0.06 ng/mL (ref <0.03)
Troponin I: 0.06 ng/mL (ref <0.03)
Troponin I: 0.06 ng/mL (ref <0.03)

## 2016-09-28 LAB — CK TOTAL AND CKMB (NOT AT ARMC)
CK, MB: 2.2 ng/mL (ref 0.5–5.0)
RELATIVE INDEX: INVALID (ref 0.0–2.5)
Total CK: 75 U/L (ref 38–234)

## 2016-09-28 LAB — MAGNESIUM: Magnesium: 1.7 mg/dL (ref 1.7–2.4)

## 2016-09-28 LAB — CBC
HCT: 26.9 % — ABNORMAL LOW (ref 36.0–46.0)
Hemoglobin: 8.9 g/dL — ABNORMAL LOW (ref 12.0–15.0)
MCH: 27.4 pg (ref 26.0–34.0)
MCHC: 33.1 g/dL (ref 30.0–36.0)
MCV: 82.8 fL (ref 78.0–100.0)
Platelets: 135 10*3/uL — ABNORMAL LOW (ref 150–400)
RBC: 3.25 MIL/uL — ABNORMAL LOW (ref 3.87–5.11)
RDW: 16.6 % — ABNORMAL HIGH (ref 11.5–15.5)
WBC: 9 10*3/uL (ref 4.0–10.5)

## 2016-09-28 LAB — GLUCOSE, CAPILLARY: GLUCOSE-CAPILLARY: 74 mg/dL (ref 65–99)

## 2016-09-28 LAB — MRSA PCR SCREENING: MRSA by PCR: NEGATIVE

## 2016-09-28 LAB — PHOSPHORUS: Phosphorus: 3.8 mg/dL (ref 2.5–4.6)

## 2016-09-28 SURGERY — REPAIR, HERNIA, INGUINAL, LAPAROSCOPIC
Anesthesia: General | Site: Groin

## 2016-09-28 MED ORDER — HYDROCODONE-ACETAMINOPHEN 5-325 MG PO TABS: 1.0000 | ORAL_TABLET | ORAL | 0 refills | Status: DC | PRN

## 2016-09-28 MED ORDER — MIDAZOLAM HCL 2 MG/2ML IJ SOLN: 1.0000 mg | INTRAMUSCULAR | Status: DC | PRN

## 2016-09-28 MED ORDER — PHENYLEPHRINE HCL 10 MG/ML IJ SOLN
INTRAVENOUS | Status: DC | PRN
  Administered 2016-09-28: 150 ug/min via INTRAVENOUS
  Administered 2016-09-28: 125 ug/min via INTRAVENOUS

## 2016-09-28 MED ORDER — LACTATED RINGERS IV BOLUS (SEPSIS)
500.0000 mL | Freq: Three times a day (TID) | INTRAVENOUS | Status: DC | PRN
  Administered 2016-09-29: 500 mL via INTRAVENOUS

## 2016-09-28 MED ORDER — EPINEPHRINE PF 1 MG/10ML IJ SOSY
PREFILLED_SYRINGE | INTRAMUSCULAR | Status: DC | PRN
  Administered 2016-09-28 (×3): 5 ug via INTRAVENOUS

## 2016-09-28 MED ORDER — SPIRONOLACTONE 25 MG PO TABS: 25.0000 mg | ORAL_TABLET | Freq: Every day | ORAL | Status: DC

## 2016-09-28 MED ORDER — DIPHENHYDRAMINE HCL 50 MG/ML IJ SOLN: 12.5000 mg | Freq: Four times a day (QID) | INTRAMUSCULAR | Status: DC | PRN

## 2016-09-28 MED ORDER — VITAMIN D 1000 UNITS PO TABS
2000.0000 [IU] | ORAL_TABLET | Freq: Every day | ORAL | Status: DC
  Administered 2016-09-29 – 2016-10-08 (×9): 2000 [IU] via ORAL
  Filled 2016-09-28 (×10): qty 2

## 2016-09-28 MED ORDER — PHENYLEPHRINE HCL 10 MG/ML IJ SOLN
INTRAMUSCULAR | Status: AC
  Filled 2016-09-28: qty 1

## 2016-09-28 MED ORDER — ASPIRIN EC 81 MG PO TBEC: 81.0000 mg | DELAYED_RELEASE_TABLET | Freq: Every day | ORAL | Status: DC

## 2016-09-28 MED ORDER — ENOXAPARIN SODIUM 40 MG/0.4ML ~~LOC~~ SOLN
40.0000 mg | SUBCUTANEOUS | Status: DC
  Administered 2016-09-29 – 2016-10-08 (×10): 40 mg via SUBCUTANEOUS
  Filled 2016-09-28 (×10): qty 0.4

## 2016-09-28 MED ORDER — HYDROMORPHONE HCL 1 MG/ML IJ SOLN: 0.5000 mg | INTRAMUSCULAR | Status: DC | PRN

## 2016-09-28 MED ORDER — CEFAZOLIN SODIUM-DEXTROSE 2-4 GM/100ML-% IV SOLN
2.0000 g | INTRAVENOUS | Status: AC
Start: 2016-09-28 — End: 2016-09-28
  Administered 2016-09-28: 2 g via INTRAVENOUS

## 2016-09-28 MED ORDER — IRBESARTAN 150 MG PO TABS
150.0000 mg | ORAL_TABLET | Freq: Every day | ORAL | Status: DC
  Filled 2016-09-28 (×3): qty 1

## 2016-09-28 MED ORDER — ETOMIDATE 2 MG/ML IV SOLN
INTRAVENOUS | Status: DC | PRN
  Administered 2016-09-28: 16 mg via INTRAVENOUS

## 2016-09-28 MED ORDER — LIDOCAINE 2% (20 MG/ML) 5 ML SYRINGE
INTRAMUSCULAR | Status: AC
  Filled 2016-09-28: qty 10

## 2016-09-28 MED ORDER — SUGAMMADEX SODIUM 200 MG/2ML IV SOLN
INTRAVENOUS | Status: DC | PRN
  Administered 2016-09-28: 125 mg via INTRAVENOUS

## 2016-09-28 MED ORDER — POLYETHYLENE GLYCOL 3350 17 G PO PACK
17.0000 g | PACK | Freq: Every day | ORAL | Status: DC | PRN
  Administered 2016-10-01 – 2016-10-05 (×2): 17 g via ORAL
  Filled 2016-09-28 (×2): qty 1

## 2016-09-28 MED ORDER — FENTANYL CITRATE (PF) 250 MCG/5ML IJ SOLN
INTRAMUSCULAR | Status: AC
  Filled 2016-09-28: qty 5

## 2016-09-28 MED ORDER — BUPIVACAINE HCL (PF) 0.25 % IJ SOLN
INTRAMUSCULAR | Status: AC
  Filled 2016-09-28: qty 60

## 2016-09-28 MED ORDER — DEXMEDETOMIDINE HCL IN NACL 400 MCG/100ML IV SOLN
INTRAVENOUS | Status: DC | PRN
  Administered 2016-09-28: .3 ug/kg/h via INTRAVENOUS

## 2016-09-28 MED ORDER — HYDRALAZINE HCL 20 MG/ML IJ SOLN: 5.0000 mg | INTRAMUSCULAR | Status: DC | PRN

## 2016-09-28 MED ORDER — METHOCARBAMOL 1000 MG/10ML IJ SOLN
1000.0000 mg | Freq: Four times a day (QID) | INTRAVENOUS | Status: DC | PRN
  Filled 2016-09-28: qty 10

## 2016-09-28 MED ORDER — PHENYLEPHRINE HCL 10 MG/ML IJ SOLN
INTRAMUSCULAR | Status: DC | PRN
  Administered 2016-09-28 (×3): 80 ug via INTRAVENOUS

## 2016-09-28 MED ORDER — LACTATED RINGERS IV SOLN
INTRAVENOUS | Status: DC | PRN
  Administered 2016-09-28 (×2): via INTRAVENOUS

## 2016-09-28 MED ORDER — PROPOFOL 10 MG/ML IV BOLUS
INTRAVENOUS | Status: AC
  Filled 2016-09-28: qty 20

## 2016-09-28 MED ORDER — ORAL CARE MOUTH RINSE
15.0000 mL | OROMUCOSAL | Status: DC
  Administered 2016-09-28 – 2016-09-29 (×6): 15 mL via OROMUCOSAL

## 2016-09-28 MED ORDER — SODIUM CHLORIDE 0.9 % IJ SOLN
INTRAMUSCULAR | Status: DC | PRN
  Administered 2016-09-28: 20 mL via INTRAVENOUS

## 2016-09-28 MED ORDER — ROCURONIUM BROMIDE 10 MG/ML (PF) SYRINGE
PREFILLED_SYRINGE | INTRAVENOUS | Status: AC
  Filled 2016-09-28: qty 5

## 2016-09-28 MED ORDER — ETOMIDATE 2 MG/ML IV SOLN
INTRAVENOUS | Status: AC
  Filled 2016-09-28: qty 10

## 2016-09-28 MED ORDER — PHENYLEPHRINE 40 MCG/ML (10ML) SYRINGE FOR IV PUSH (FOR BLOOD PRESSURE SUPPORT)
PREFILLED_SYRINGE | INTRAVENOUS | Status: AC
  Filled 2016-09-28: qty 10

## 2016-09-28 MED ORDER — METHOCARBAMOL 500 MG PO TABS: 1000.0000 mg | ORAL_TABLET | Freq: Four times a day (QID) | ORAL | Status: DC | PRN

## 2016-09-28 MED ORDER — PHENOL 1.4 % MT LIQD
2.0000 | OROMUCOSAL | Status: DC | PRN
  Administered 2016-09-29: 2 via OROMUCOSAL
  Filled 2016-09-28: qty 177

## 2016-09-28 MED ORDER — SODIUM CHLORIDE 0.9 % IR SOLN
Status: DC | PRN
  Administered 2016-09-28: 1

## 2016-09-28 MED ORDER — ONDANSETRON 4 MG PO TBDP
4.0000 mg | ORAL_TABLET | Freq: Four times a day (QID) | ORAL | Status: DC | PRN
  Filled 2016-09-28: qty 1

## 2016-09-28 MED ORDER — ACETAMINOPHEN 650 MG RE SUPP: 650.0000 mg | Freq: Four times a day (QID) | RECTAL | Status: DC | PRN

## 2016-09-28 MED ORDER — SODIUM CHLORIDE 0.9 % IV SOLN
INTRAVENOUS | Status: DC
  Administered 2016-09-28 – 2016-09-29 (×2): via INTRAVENOUS

## 2016-09-28 MED ORDER — POTASSIUM CHLORIDE CRYS ER 10 MEQ PO TBCR
10.0000 meq | EXTENDED_RELEASE_TABLET | Freq: Every day | ORAL | Status: DC
  Filled 2016-09-28: qty 1

## 2016-09-28 MED ORDER — DEXMEDETOMIDINE HCL IN NACL 200 MCG/50ML IV SOLN
0.4000 ug/kg/h | INTRAVENOUS | Status: DC
  Administered 2016-09-28: 0.4 ug/kg/h via INTRAVENOUS
  Administered 2016-09-28: 0.5 ug/kg/h via INTRAVENOUS
  Filled 2016-09-28 (×3): qty 50

## 2016-09-28 MED ORDER — OXYCODONE HCL 5 MG/5ML PO SOLN: 5.0000 mg | Freq: Once | ORAL | Status: DC | PRN

## 2016-09-28 MED ORDER — ONDANSETRON HCL 4 MG/2ML IJ SOLN: 4.0000 mg | Freq: Four times a day (QID) | INTRAMUSCULAR | Status: DC | PRN

## 2016-09-28 MED ORDER — NOREPINEPHRINE BITARTRATE 1 MG/ML IV SOLN
0.0000 ug/min | INTRAVENOUS | Status: DC
  Filled 2016-09-28: qty 16

## 2016-09-28 MED ORDER — OXYCODONE HCL 5 MG PO TABS: 5.0000 mg | ORAL_TABLET | Freq: Once | ORAL | Status: DC | PRN

## 2016-09-28 MED ORDER — SUGAMMADEX SODIUM 200 MG/2ML IV SOLN
INTRAVENOUS | Status: AC
  Filled 2016-09-28: qty 2

## 2016-09-28 MED ORDER — CALCIUM CHLORIDE 10 % IV SOLN
INTRAVENOUS | Status: DC | PRN
  Administered 2016-09-28 (×5): 100 mg via INTRAVENOUS
  Administered 2016-09-28: 200 mg via INTRAVENOUS
  Administered 2016-09-28 (×2): 100 mg via INTRAVENOUS

## 2016-09-28 MED ORDER — CHLORHEXIDINE GLUCONATE 0.12% ORAL RINSE (MEDLINE KIT)
15.0000 mL | Freq: Two times a day (BID) | OROMUCOSAL | Status: DC
  Administered 2016-09-28 – 2016-09-29 (×2): 15 mL via OROMUCOSAL

## 2016-09-28 MED ORDER — SODIUM CHLORIDE 0.9% FLUSH: 3.0000 mL | INTRAVENOUS | Status: DC | PRN

## 2016-09-28 MED ORDER — BUPIVACAINE-EPINEPHRINE 0.25% -1:200000 IJ SOLN
INTRAMUSCULAR | Status: DC | PRN
  Administered 2016-09-28: 20 mL

## 2016-09-28 MED ORDER — FENTANYL CITRATE (PF) 100 MCG/2ML IJ SOLN: 50.0000 ug | INTRAMUSCULAR | Status: DC | PRN

## 2016-09-28 MED ORDER — BISACODYL 10 MG RE SUPP: 10.0000 mg | Freq: Two times a day (BID) | RECTAL | Status: DC | PRN

## 2016-09-28 MED ORDER — ONDANSETRON HCL 4 MG/2ML IJ SOLN
INTRAMUSCULAR | Status: DC | PRN
  Administered 2016-09-28: 4 mg via INTRAVENOUS

## 2016-09-28 MED ORDER — GABAPENTIN 300 MG PO CAPS
ORAL_CAPSULE | ORAL | Status: AC
  Administered 2016-09-28: 300 mg
  Filled 2016-09-28: qty 1

## 2016-09-28 MED ORDER — EPHEDRINE SULFATE 50 MG/ML IJ SOLN
INTRAMUSCULAR | Status: DC | PRN
  Administered 2016-09-28: 20 mg via INTRAVENOUS
  Administered 2016-09-28: 10 mg via INTRAVENOUS

## 2016-09-28 MED ORDER — MENTHOL 3 MG MT LOZG
1.0000 | LOZENGE | OROMUCOSAL | Status: DC | PRN
  Filled 2016-09-28: qty 9

## 2016-09-28 MED ORDER — MIDAZOLAM HCL 2 MG/2ML IJ SOLN
INTRAMUSCULAR | Status: AC
  Filled 2016-09-28: qty 2

## 2016-09-28 MED ORDER — HYDROCODONE-ACETAMINOPHEN 5-325 MG PO TABS
1.0000 | ORAL_TABLET | ORAL | Status: DC | PRN
  Administered 2016-09-29: 1 via ORAL
  Filled 2016-09-28: qty 1

## 2016-09-28 MED ORDER — ALUM & MAG HYDROXIDE-SIMETH 200-200-20 MG/5ML PO SUSP: 30.0000 mL | Freq: Four times a day (QID) | ORAL | Status: DC | PRN

## 2016-09-28 MED ORDER — FENTANYL CITRATE (PF) 100 MCG/2ML IJ SOLN: 25.0000 ug | INTRAMUSCULAR | Status: DC | PRN

## 2016-09-28 MED ORDER — ATORVASTATIN CALCIUM 20 MG PO TABS
20.0000 mg | ORAL_TABLET | Freq: Every day | ORAL | Status: DC
  Administered 2016-09-29 – 2016-10-01 (×3): 20 mg
  Filled 2016-09-28 (×3): qty 1

## 2016-09-28 MED ORDER — PHENYLEPHRINE HCL 10 MG/ML IJ SOLN
INTRAVENOUS | Status: DC | PRN
  Administered 2016-09-28: 75 ug/min via INTRAVENOUS
  Administered 2016-09-28: 125 ug/min via INTRAVENOUS

## 2016-09-28 MED ORDER — MEPERIDINE HCL 25 MG/ML IJ SOLN: 6.2500 mg | INTRAMUSCULAR | Status: DC | PRN

## 2016-09-28 MED ORDER — CHLORHEXIDINE GLUCONATE CLOTH 2 % EX PADS: 6.0000 | MEDICATED_PAD | Freq: Once | CUTANEOUS | Status: DC

## 2016-09-28 MED ORDER — FENTANYL CITRATE (PF) 100 MCG/2ML IJ SOLN
INTRAMUSCULAR | Status: DC | PRN
  Administered 2016-09-28: 50 ug via INTRAVENOUS

## 2016-09-28 MED ORDER — ACETAMINOPHEN 160 MG/5ML PO SOLN: 325.0000 mg | ORAL | Status: DC | PRN

## 2016-09-28 MED ORDER — ALBUMIN HUMAN 5 % IV SOLN
INTRAVENOUS | Status: DC | PRN
  Administered 2016-09-28 (×5): via INTRAVENOUS

## 2016-09-28 MED ORDER — ENALAPRILAT 1.25 MG/ML IV SOLN
0.6250 mg | Freq: Four times a day (QID) | INTRAVENOUS | Status: DC | PRN
  Filled 2016-09-28: qty 1

## 2016-09-28 MED ORDER — SODIUM CHLORIDE 0.9 % IV SOLN: 250.0000 mL | INTRAVENOUS | Status: DC | PRN

## 2016-09-28 MED ORDER — CEFAZOLIN SODIUM-DEXTROSE 2-4 GM/100ML-% IV SOLN
INTRAVENOUS | Status: AC
  Filled 2016-09-28: qty 100

## 2016-09-28 MED ORDER — LIDOCAINE HCL (CARDIAC) 20 MG/ML IV SOLN
INTRAVENOUS | Status: DC | PRN
  Administered 2016-09-28 (×2): 100 mg via INTRAVENOUS

## 2016-09-28 MED ORDER — METOLAZONE 2.5 MG PO TABS: 2.5000 mg | ORAL_TABLET | ORAL | Status: DC

## 2016-09-28 MED ORDER — SIMETHICONE 80 MG PO CHEW: 40.0000 mg | CHEWABLE_TABLET | Freq: Four times a day (QID) | ORAL | Status: DC | PRN

## 2016-09-28 MED ORDER — ACETAMINOPHEN 500 MG PO TABS: 1000.0000 mg | ORAL_TABLET | ORAL | Status: DC

## 2016-09-28 MED ORDER — SODIUM CHLORIDE 0.9% FLUSH
3.0000 mL | Freq: Two times a day (BID) | INTRAVENOUS | Status: DC
  Administered 2016-09-28: 10 mL via INTRAVENOUS
  Administered 2016-09-29: 3 mL via INTRAVENOUS
  Administered 2016-09-29: 10 mL via INTRAVENOUS
  Administered 2016-10-01 – 2016-10-07 (×14): 3 mL via INTRAVENOUS

## 2016-09-28 MED ORDER — ACETAMINOPHEN 325 MG PO TABS
325.0000 mg | ORAL_TABLET | Freq: Four times a day (QID) | ORAL | Status: DC | PRN
  Administered 2016-09-30: 650 mg via ORAL
  Filled 2016-09-28: qty 2

## 2016-09-28 MED ORDER — NOREPINEPHRINE BITARTRATE 1 MG/ML IV SOLN
0.0000 ug/min | INTRAVENOUS | Status: AC
  Administered 2016-09-28: 10 ug/min via INTRAVENOUS
  Filled 2016-09-28: qty 16

## 2016-09-28 MED ORDER — SODIUM CHLORIDE 0.9 % IV SOLN
0.0000 ug/min | INTRAVENOUS | Status: DC
  Filled 2016-09-28: qty 2

## 2016-09-28 MED ORDER — BUPIVACAINE HCL (PF) 0.25 % IJ SOLN
INTRAMUSCULAR | Status: AC
  Filled 2016-09-28: qty 30

## 2016-09-28 MED ORDER — CARVEDILOL 12.5 MG PO TABS: 12.5000 mg | ORAL_TABLET | Freq: Two times a day (BID) | ORAL | Status: DC

## 2016-09-28 MED ORDER — METHOCARBAMOL 500 MG PO TABS
500.0000 mg | ORAL_TABLET | Freq: Four times a day (QID) | ORAL | Status: DC | PRN
  Administered 2016-10-02 – 2016-10-06 (×6): 500 mg via ORAL
  Filled 2016-09-28 (×6): qty 1

## 2016-09-28 MED ORDER — ONDANSETRON HCL 4 MG/2ML IJ SOLN
INTRAMUSCULAR | Status: AC
  Filled 2016-09-28: qty 2

## 2016-09-28 MED ORDER — PROCHLORPERAZINE EDISYLATE 5 MG/ML IJ SOLN
5.0000 mg | INTRAMUSCULAR | Status: DC | PRN
  Filled 2016-09-28: qty 2

## 2016-09-28 MED ORDER — MAGIC MOUTHWASH
15.0000 mL | Freq: Four times a day (QID) | ORAL | Status: DC | PRN
  Filled 2016-09-28: qty 15

## 2016-09-28 MED ORDER — ACETAMINOPHEN 500 MG PO TABS
ORAL_TABLET | ORAL | Status: AC
  Administered 2016-09-28: 1000 mg
  Filled 2016-09-28: qty 2

## 2016-09-28 MED ORDER — ACETAMINOPHEN 325 MG PO TABS: 325.0000 mg | ORAL_TABLET | ORAL | Status: DC | PRN

## 2016-09-28 MED ORDER — GLYCOPYRROLATE 0.2 MG/ML IJ SOLN
INTRAMUSCULAR | Status: DC | PRN
  Administered 2016-09-28: 0.1 mg via INTRAVENOUS
  Administered 2016-09-28: 0.2 mg via INTRAVENOUS
  Administered 2016-09-28: 0.3 mg via INTRAVENOUS

## 2016-09-28 MED ORDER — 0.9 % SODIUM CHLORIDE (POUR BTL) OPTIME
TOPICAL | Status: DC | PRN
  Administered 2016-09-28: 1000 mL

## 2016-09-28 MED ORDER — ATORVASTATIN CALCIUM 20 MG PO TABS: 20.0000 mg | ORAL_TABLET | Freq: Every day | ORAL | Status: DC

## 2016-09-28 MED ORDER — CALCIUM CHLORIDE 10 % IV SOLN
INTRAVENOUS | Status: AC
  Filled 2016-09-28: qty 10

## 2016-09-28 MED ORDER — BLISTEX MEDICATED EX OINT
1.0000 "application " | TOPICAL_OINTMENT | Freq: Two times a day (BID) | CUTANEOUS | Status: DC
  Administered 2016-09-29 – 2016-10-08 (×17): 1 via TOPICAL
  Filled 2016-09-28 (×2): qty 6.3

## 2016-09-28 MED ORDER — DIPHENHYDRAMINE HCL 12.5 MG/5ML PO ELIX: 12.5000 mg | ORAL_SOLUTION | Freq: Four times a day (QID) | ORAL | Status: DC | PRN

## 2016-09-28 MED ORDER — HYDRALAZINE HCL 20 MG/ML IJ SOLN: 5.0000 mg | Freq: Four times a day (QID) | INTRAMUSCULAR | Status: DC | PRN

## 2016-09-28 MED ORDER — BISACODYL 10 MG RE SUPP
10.0000 mg | Freq: Every day | RECTAL | Status: DC | PRN
  Administered 2016-10-01: 10 mg via RECTAL
  Filled 2016-09-28: qty 1

## 2016-09-28 MED ORDER — ROCURONIUM BROMIDE 100 MG/10ML IV SOLN
INTRAVENOUS | Status: DC | PRN
  Administered 2016-09-28 (×2): 10 mg via INTRAVENOUS
  Administered 2016-09-28: 30 mg via INTRAVENOUS
  Administered 2016-09-28: 20 mg via INTRAVENOUS
  Administered 2016-09-28: 30 mg via INTRAVENOUS

## 2016-09-28 MED ORDER — GABAPENTIN 300 MG PO CAPS: 300.0000 mg | ORAL_CAPSULE | ORAL | Status: DC

## 2016-09-28 MED ORDER — ASPIRIN 81 MG PO CHEW
81.0000 mg | CHEWABLE_TABLET | Freq: Every day | ORAL | Status: DC
  Administered 2016-09-29 – 2016-10-01 (×3): 81 mg
  Filled 2016-09-28 (×3): qty 1

## 2016-09-28 SURGICAL SUPPLY — 65 items
APPLICATOR COTTON TIP 6IN STRL (MISCELLANEOUS) IMPLANT
APPLIER CLIP 5 13 M/L LIGAMAX5 (MISCELLANEOUS)
APPLIER CLIP LOGIC TI 5 (MISCELLANEOUS) IMPLANT
APR CLP MED LRG 33X5 (MISCELLANEOUS)
APR CLP MED LRG 5 ANG JAW (MISCELLANEOUS)
BINDER ABDOMINAL 12 ML 46-62 (SOFTGOODS) ×2 IMPLANT
BLADE CLIPPER SURG (BLADE) ×2 IMPLANT
CANISTER SUCT 3000ML PPV (MISCELLANEOUS) ×5 IMPLANT
CHLORAPREP W/TINT 26ML (MISCELLANEOUS) ×5 IMPLANT
CLIP APPLIE 5 13 M/L LIGAMAX5 (MISCELLANEOUS) IMPLANT
CLOSURE WOUND 1/2 X4 (GAUZE/BANDAGES/DRESSINGS) ×1
COVER SURGICAL LIGHT HANDLE (MISCELLANEOUS) ×5 IMPLANT
DEVICE SECURE STRAP 25 ABSORB (INSTRUMENTS) ×6 IMPLANT
DEVICE TROCAR PUNCTURE CLOSURE (ENDOMECHANICALS) ×5 IMPLANT
DRAPE WARM FLUID 44X44 (DRAPE) ×5 IMPLANT
DRSG TEGADERM 2-3/8X2-3/4 SM (GAUZE/BANDAGES/DRESSINGS) ×10 IMPLANT
DRSG TEGADERM 4X4.75 (GAUZE/BANDAGES/DRESSINGS) ×5 IMPLANT
ELECT REM PT RETURN 9FT ADLT (ELECTROSURGICAL) ×5
ELECTRODE REM PT RTRN 9FT ADLT (ELECTROSURGICAL) ×3 IMPLANT
GAUZE SPONGE 2X2 8PLY STRL LF (GAUZE/BANDAGES/DRESSINGS) ×3 IMPLANT
GLOVE BIOGEL PI IND STRL 6.5 (GLOVE) IMPLANT
GLOVE BIOGEL PI IND STRL 7.0 (GLOVE) IMPLANT
GLOVE BIOGEL PI IND STRL 8 (GLOVE) ×3 IMPLANT
GLOVE BIOGEL PI INDICATOR 6.5 (GLOVE) ×2
GLOVE BIOGEL PI INDICATOR 7.0 (GLOVE) ×6
GLOVE BIOGEL PI INDICATOR 8 (GLOVE) ×2
GLOVE ECLIPSE 6.0 STRL STRAW (GLOVE) ×2 IMPLANT
GLOVE ECLIPSE 8.0 STRL XLNG CF (GLOVE) ×5 IMPLANT
GLOVE SURG SS PI 7.0 STRL IVOR (GLOVE) ×10 IMPLANT
GOWN STRL REUS W/ TWL LRG LVL3 (GOWN DISPOSABLE) ×6 IMPLANT
GOWN STRL REUS W/ TWL XL LVL3 (GOWN DISPOSABLE) ×3 IMPLANT
GOWN STRL REUS W/TWL LRG LVL3 (GOWN DISPOSABLE) ×25
GOWN STRL REUS W/TWL XL LVL3 (GOWN DISPOSABLE) ×5
KIT BASIN OR (CUSTOM PROCEDURE TRAY) ×5 IMPLANT
KIT ROOM TURNOVER OR (KITS) ×5 IMPLANT
MARKER SKIN DUAL TIP RULER LAB (MISCELLANEOUS) ×5 IMPLANT
MESH ULTRAPRO 6X6 15CM15CM (Mesh General) ×6 IMPLANT
MESH VENTRALIGHT ST 6X8 (Mesh Specialty) ×5 IMPLANT
MESH VENTRLGHT ELLIPSE 8X6XMFL (Mesh Specialty) IMPLANT
NDL SPNL 22GX3.5 QUINCKE BK (NEEDLE) ×3 IMPLANT
NEEDLE 22X1 1/2 (OR ONLY) (NEEDLE) ×5 IMPLANT
NEEDLE SPNL 22GX3.5 QUINCKE BK (NEEDLE) ×5 IMPLANT
NS IRRIG 1000ML POUR BTL (IV SOLUTION) ×10 IMPLANT
PAD ARMBOARD 7.5X6 YLW CONV (MISCELLANEOUS) ×10 IMPLANT
SCISSORS LAP 5X35 DISP (ENDOMECHANICALS) ×5 IMPLANT
SET IRRIG TUBING LAPAROSCOPIC (IRRIGATION / IRRIGATOR) ×5 IMPLANT
SHEARS HARMONIC ACE PLUS 36CM (ENDOMECHANICALS) IMPLANT
SLEEVE ENDOPATH XCEL 5M (ENDOMECHANICALS) ×10 IMPLANT
SPONGE GAUZE 2X2 STER 10/PKG (GAUZE/BANDAGES/DRESSINGS) ×2
STRIP CLOSURE SKIN 1/2X4 (GAUZE/BANDAGES/DRESSINGS) ×4 IMPLANT
SUT MNCRL AB 4-0 PS2 18 (SUTURE) ×5 IMPLANT
SUT PDS AB 1 CT  36 (SUTURE)
SUT PDS AB 1 CT 36 (SUTURE) IMPLANT
SUT PROLENE 1 CT (SUTURE) ×10 IMPLANT
SUT VICRYL 0 UR6 27IN ABS (SUTURE) ×2 IMPLANT
TACKER 5MM HERNIA 3.5CML NAB (ENDOMECHANICALS) IMPLANT
TOWEL OR 17X24 6PK STRL BLUE (TOWEL DISPOSABLE) ×5 IMPLANT
TOWEL OR 17X26 10 PK STRL BLUE (TOWEL DISPOSABLE) ×5 IMPLANT
TRAY FOLEY CATH SILVER 16FR (SET/KITS/TRAYS/PACK) ×2 IMPLANT
TRAY LAPAROSCOPIC MC (CUSTOM PROCEDURE TRAY) ×5 IMPLANT
TROCAR ADV FIXATION 5X100MM (TROCAR) ×10 IMPLANT
TROCAR XCEL BLUNT TIP 100MML (ENDOMECHANICALS) ×5 IMPLANT
TROCAR XCEL NON-BLD 11X100MML (ENDOMECHANICALS) ×5 IMPLANT
TROCAR XCEL NON-BLD 5MMX100MML (ENDOMECHANICALS) ×5 IMPLANT
TUBING INSUFFLATION (TUBING) ×5 IMPLANT

## 2016-09-28 NOTE — Progress Notes (Signed)
Thanks, she is on our rounding list. Sheri Dunn

## 2016-09-28 NOTE — Progress Notes (Signed)
RN called from PACU- states Anesthesiologist wants RT to attempt to wean pt on vent.  Placed pt on PSV- pt RR =2-4 bpm, min volume 1-2 lpm, VT 120 ml.  Attempted to wake pt up, increased psv.  Pt falls back asleep and spont RR drops back to 2-4 w/ no stimulation.  Pt placed back on full vent support for now, fio2 weaned to 50%, sat 100%.  RN at bedside and aware.

## 2016-09-28 NOTE — Anesthesia Postprocedure Evaluation (Signed)
Anesthesia Post Note  Patient: Sheri Dunn  Procedure(s) Performed: Procedure(s) (LRB): LAPAROSCOPIC BILATERAL INGUINAL HERNIAS WITH MESH, LAPAROSCOPIC BILATERAL OBTURATOR HERNIAS WITH MESH (Bilateral) LAPAROSCOPIC UMBILICAL HERNIA (N/A) INSERTION OF MESH (Bilateral)  Patient location during evaluation: PACU Anesthesia Type: General Level of consciousness: patient remains intubated per anesthesia plan Pain management: pain level controlled Vital Signs Assessment: post-procedure vital signs reviewed and stable Respiratory status: patient remains intubated per anesthesia plan and patient on ventilator - see flowsheet for VS Cardiovascular status: blood pressure returned to baseline and stable Postop Assessment: no signs of nausea or vomiting Anesthetic complications: yes Anesthetic complication details: anesthesia complicationsComments: Possible periop MI.        Last Vitals:  Vitals:   09/28/16 1500 09/28/16 1509  BP: (!) 88/47 (!) 92/46  Pulse: (!) 48 (!) 49  Resp: 12 14  Temp:      Last Pain:  Vitals:   09/28/16 1500  TempSrc:   PainSc: Asleep   Pain Goal:                 Sheri Dunn JR,Sheri Dunn

## 2016-09-28 NOTE — Progress Notes (Signed)
CRITICAL VALUE ALERT  Critical value received:  Trop. 0.06  Date of notification:  09/28/2016  Time of notification:  1325  Critical value read back:Yes.    Nurse who received alert:  Atha Starks RN   MD notified (1st page): Dr Arby Barrette  Time of first page: 1339  MD notified (2nd page):  Time of second page:  Responding MD:  Dr. Arby Barrette  Time MD responded:  [phone]

## 2016-09-28 NOTE — Anesthesia Procedure Notes (Signed)
Procedure Name: Intubation Date/Time: 09/28/2016 7:54 AM Performed by: Valda Favia Pre-anesthesia Checklist: Patient identified, Emergency Drugs available, Suction available, Patient being monitored and Timeout performed Patient Re-evaluated:Patient Re-evaluated prior to inductionOxygen Delivery Method: Circle system utilized Preoxygenation: Pre-oxygenation with 100% oxygen Intubation Type: IV induction Ventilation: Mask ventilation without difficulty Laryngoscope Size: Mac and 4 Grade View: Grade II Tube type: Oral Tube size: 7.0 mm Airway Equipment and Method: Stylet Placement Confirmation: ETT inserted through vocal cords under direct vision,  positive ETCO2 and breath sounds checked- equal and bilateral Secured at: 20 cm Tube secured with: Tape Dental Injury: Teeth and Oropharynx as per pre-operative assessment

## 2016-09-28 NOTE — Transfer of Care (Signed)
Immediate Anesthesia Transfer of Care Note  Patient: Reniah Cottingham  Procedure(s) Performed: Procedure(s): LAPAROSCOPIC BILATERAL INGUINAL HERNIAS WITH MESH, LAPAROSCOPIC BILATERAL OBTURATOR HERNIAS WITH MESH (Bilateral) LAPAROSCOPIC UMBILICAL HERNIA (N/A) INSERTION OF MESH (Bilateral)  Patient Location: PACU  Anesthesia Type:General  Level of Consciousness: sedated and Patient remains intubated per anesthesia plan  Airway & Oxygen Therapy: Patient placed on Ventilator (see vital sign flow sheet for setting)  Post-op Assessment: Report given to RN and Post -op Vital signs reviewed and stable  Post vital signs: Reviewed and stable  Last Vitals:  Vitals:   09/28/16 0547 09/28/16 1207  BP: 111/63 134/73  Pulse: 67 70  Resp: 18 15  Temp: 36.4 C 36.1 C    Last Pain:  Vitals:   09/28/16 1207  TempSrc:   PainSc: Asleep         Complications: No apparent anesthesia complications

## 2016-09-28 NOTE — Interval H&P Note (Signed)
History and Physical Interval Note:  09/28/2016 7:31 AM  Sheri Dunn  has presented today for surgery, with the diagnosis of Bilateral inguinal hernias, ventral wall hernia  The various methods of treatment have been discussed with the patient and family. After consideration of risks, benefits and other options for treatment, the patient has consented to  Procedure(s): LAPAROSCOPIC BILATERAL INGUINAL HERNIAS WITH MESH (Bilateral) LAPAROSCOPIC VENTRAL HERNIA (N/A) INSERTION OF MESH (Bilateral) as a surgical intervention .  The patient's history has been reviewed, patient examined, no change in status, stable for surgery.  I have reviewed the patient's chart and labs.  Questions were answered to the patient's satisfaction.     [name]

## 2016-09-28 NOTE — Interval H&P Note (Signed)
History and Physical Interval Note:  09/28/2016 7:35 AM  Sheri Dunn  has presented today for surgery, with the diagnosis of Bilateral inguinal hernias, ventral wall hernia  The various methods of treatment have been discussed with the patient and family. After consideration of risks, benefits and other options for treatment, the patient has consented to  Procedure(s): LAPAROSCOPIC BILATERAL INGUINAL HERNIAS WITH MESH (Bilateral) LAPAROSCOPIC VENTRAL HERNIA (N/A) INSERTION OF MESH (Bilateral) as a surgical intervention .  The patient's history has been reviewed, patient examined, no change in status, stable for surgery.  I have reviewed the patient's chart and labs.  Questions were answered to the patient's satisfaction.     [name] C.

## 2016-09-28 NOTE — Progress Notes (Addendum)
Vent changes made per MD order w/ Dr Tyson Alias at bedside in PACU.  Pt transported to 2H w/ no apparent complications.   Unit RT given report

## 2016-09-28 NOTE — Consult Note (Signed)
PULMONARY / CRITICAL CARE MEDICINE   Name: Sheri Dunn MRN: [id number] DOB: 1951/04/20    ADMISSION DATE:  09/28/2016 CONSULTATION DATE:  09/28/16  REFERRING MD:  Michaell Cowing  CHIEF COMPLAINT:  Vent management post op  HISTORY OF PRESENT ILLNESS:  Pt is encephelopathic; therefore, this HPI is obtained from chart review. Sheri Dunn is a 66 y.o. female with PMH as outlined below including known inguinal and umbilical hernias, followed by Dr. Doylene Canard at [facility].  She was admitted 4/26 for elective surgical repair and underwent bilateral laparoscopic inguinal and obturator hernia repairs with mesh, laparoscopic umbilical hernia with mesh, needle aspiration of seroma, insertion of mesh (performed by Dr. Michaell Cowing).  Post op, she was unable to be extubated; therefore, PCCM was called to assist with vent management.  Of note, she is followed by Dr. Rubie Maid for known severe combined CHF (Echo from Oct 2017 with EF 20-25%, G2DD, PAP 47).  PAST MEDICAL HISTORY :  She  has a past medical history of CAD (coronary artery disease); Chronic combined systolic and diastolic CHF (congestive heart failure) (HCC); Dyspnea; Elevated ALT measurement; HLD (hyperlipidemia); Hypertension; Ischemic cardiomyopathy; and Prolonged Q-T interval on ECG.  PAST SURGICAL HISTORY: She  has a past surgical history that includes left heart catheterization with coronary angiogram (N/A, 09/08/2014); Cardiac catheterization (Right, 09/08/2014); Cardiac catheterization (N/A, 04/27/2015); and Colonoscopy.  Allergies  Allergen Reactions  . No Known Allergies     No current facility-administered medications on file prior to encounter.    Current Outpatient Prescriptions on File Prior to Encounter  Medication Sig  . aspirin 81 MG tablet Take 81 mg by mouth daily.  Marland Kitchen atorvastatin (LIPITOR) 20 MG tablet Take 1 tablet (20 mg total) by mouth daily.  . carvedilol (COREG) 12.5 MG tablet Take 1 tablet (12.5 mg total) by  mouth 2 (two) times daily with a meal.  . Cholecalciferol (VITAMIN D3) 2000 UNITS TABS Take 2,000 Units by mouth daily.  . furosemide (LASIX) 40 MG tablet take 2 tablets (80 MG) by mouth once daily WHEN YOUR WEIGHT REACHES 117 POUNDS REDUCE BACK TO 1 TABLET ( ) EVERY DAY  . metolazone (ZAROXOLYN) 2.5 MG tablet Take 1 tablet (2.5 mg total) by mouth 3 (three) times a week. 30 minutes before taking Lasix on Mondays, Wednesdays, and Fridays.  . potassium chloride (K-DUR) 10 MEQ tablet Take 1 tablet (10 mEq total) by mouth daily.  Marland Kitchen spironolactone (ALDACTONE) 25 MG tablet Take 1 tablet (25 mg total) by mouth daily.    FAMILY HISTORY:  Her indicated that her mother is deceased. She indicated that her father is deceased. She indicated that the status of her sister is unknown.    SOCIAL HISTORY: She  reports that she has never smoked. She has never used smokeless tobacco. She reports that she does not drink alcohol or use drugs.  REVIEW OF SYSTEMS:  Unable to obtain as pt is encephalopathic.  SUBJECTIVE:  On vent, unresponsive.  VITAL SIGNS: BP (!) 97/52 (BP Location: Right Arm)   Pulse (!) 48   Temp 97.7 F (36.5 C)   Resp 12   Wt 61.2 kg (135 lb)   SpO2 100%   BMI 21.79 kg/m   HEMODYNAMICS:    VENTILATOR SETTINGS: Vent Mode: PRVC FiO2 (%):  [50 %-100 %] 50 % Set Rate:  [12 bmp] 12 bmp Vt Set:  [550 mL] 550 mL PEEP:  [5 cmH20] 5 cmH20 Plateau Pressure:  [16 cmH20] 16 cmH20  INTAKE / OUTPUT: No intake/output  data recorded.   PHYSICAL EXAMINATION: General: Chronically ill appearing female, in NAD. Neuro: Sedated, non-responsive. HEENT: [place]/AT. PERRL, sclerae anicteric. Cardiovascular: RRR, ? 2/6 SEM. Crepitus to bilateral chest wall. Lungs: Respirations even and unlabored.  CTA bilaterally, No W/R/R. Abdomen: Abdominal binder in place.  Trochar sites noted, C/D/I. Musculoskeletal: No gross deformities, no edema.  Skin: Intact, warm, no rashes.  LABS:  BMET No  results for input(s): NA, K, CL, CO2, BUN, CREATININE, GLUCOSE in the last 168 hours.  Electrolytes No results for input(s): CALCIUM, MG, PHOS in the last 168 hours.  CBC No results for input(s): WBC, HGB, HCT, PLT in the last 168 hours.  Coag's No results for input(s): APTT, INR in the last 168 hours.  Sepsis Markers No results for input(s): LATICACIDVEN, PROCALCITON, O2SATVEN in the last 168 hours.  ABG No results for input(s): PHART, PCO2ART, PO2ART in the last 168 hours.  Liver Enzymes No results for input(s): AST, ALT, ALKPHOS, BILITOT, ALBUMIN in the last 168 hours.  Cardiac Enzymes  Recent Labs Lab 09/28/16 1233  TROPONINI 0.06*    Glucose No results for input(s): GLUCAP in the last 168 hours.  Imaging No results found.   STUDIES:  CXR 4/27 >   CULTURES: None.  ANTIBIOTICS: None.  SIGNIFICANT EVENTS: 4/26 > elective surgery (bilateral laparoscopic inguinal and obturator hernia repairs with mesh, laparoscopic umbilical hernia with mesh, needle aspiration of seroma, insertion of mesh).  LINES/TUBES: ETT 4/26 >  R radial a line 4/26 >   DISCUSSION: 66 y.o. female admitted 4/26 for surgical repair of inguinal, obturator, and umbilical hernias.  Unable to be extubated post op so PCCM asked to assist with vent management.  ASSESSMENT / PLAN:  GASTROINTESTINAL A:   Bilateral inguinal and obturator along with umbilical hernias - s/p laparoscopic repair with mesth 4/26 (Dr. Michaell Cowing). Nutrition. P:   Post op care per [facility]CS. Defer SUP given hx prolonged QTc. NPO.  PULMONARY A: Respiratory insufficiency - due to inability to extubate post op. ? Pneumothorax - bilateral chest wall crepitus.  Suspect from CO2 inflation during OR case. P:   Full vent support. Assess ABG. VAP prevention measures. SBT in AM if able for hopeful extubation. STAT CXR now. CXR in AM.  CARDIOVASCULAR A:  Hypotension intra op - required neo and levo; currently OFF pressors  and remains normotensive. Troponin bump - suspect demand ischemia. Hx ICM / combined heart failure (Echo from Oct 2017 with EF 20-25%, G2DD), HTN, HLD, CAD. P:  Monitor hemodynamics. Trend troponin. Cardiology following. Continue preadmission ASA, atorvastatin. Hold preadmission carvedilol, furosemide, metolazone, K-dur, valsartan, spironolactone.  RENAL A:   No acute issues. P:   NS @ 75. CMP now, BMP in AM.  HEMATOLOGIC A:   VTE Prophylaxis. P:  SCD's. CBC now and in AM.  INFECTIOUS A:   No indication of infection. P:   Monitor clinically.  ENDOCRINE A:   No acute issues.   P:   No interventions required.  NEUROLOGIC A:   Acute encephalopathy - due to sedation. P:   Sedation:  Precedex gtt / fentanyl PRN / midazolam PRN. RASS goal: 0 to -1. Daily WUA.  Family updated: none available.  Interdisciplinary Family Meeting v Palliative Care Meeting:  Due by: 10/04/16.  CC time: 30 min.   Rutherford Guys, [place] [facility] Pager: [phone]  or [phone] 09/28/2016, 3:55 PM   STAFF NOTE: I, Rory Percy, MD FACP have  personally reviewed patient's available data, including medical history, events of note, physical examination and test results as part of my evaluation. I have discussed with resident/NP and other care providers such as pharmacist, RN and RRT. In addition, I personally evaluated patient and elicited key findings of: sedated on precedex does move head, not following commands, perrl, lungs with crackles left greater rt from subq air note crepitus bilateral chests, none in neck or face, abdo soft, wound clean also with some sub q air throughout, min edema lower ext, no labs or pcxr, she is post op hernia repair that went well now hemodynamics are improved and off pressors that where peri op, no extubation secondary to poor neuro awakens's, RN says she did follow commands post op, would get stat pcxr ensure no  air mediastinum or ptx ( unlikely, and likely of course from insilfaltion co2), in the meantime will reduce peep 3, rate to 16 as we drop to 8 cc/kg, get abg, with stat pcxr, get trop, ecg, bmet post op and cbc, can keep precedex for now as long as BP tolerates, she is responding well to lower doses precedex for now, NPO, ensure ppi, asses cbg, no family at bedside, can allowpos balance for now but some caution with EF noted The patient is critically ill with multiple organ systems failure and requires high complexity decision making for assessment and support, frequent evaluation and titration of therapies, application of advanced monitoring technologies and extensive interpretation of multiple databases.   Critical Care Time devoted to patient care services described in this note is 30 Minutes. This time reflects time of care of this signee: Rory Percy, MD FACP. This critical care time does not reflect procedure time, or teaching time or supervisory time of PA/NP/Med student/Med Resident etc but could involve care discussion time. Rest per NP/medical resident whose note is outlined above and that I agree with   Mcarthur Rossetti. Tyson Alias, MD, FACP Pgr: [phone] [place] [facility] 09/28/2016 4:29 PM

## 2016-09-28 NOTE — Progress Notes (Signed)
Called Dr Arby Barrette to inform him that both Neo and Levophed drips have been weaned down and turned off. Patient maintaining BP of 120/55 on right arterial line and 108/59 cuff pressure. Per Dr Arby Barrette will page respiratory to set ventilator to pressure support mode in hopes of extubation.

## 2016-09-28 NOTE — H&P (Signed)
Orville Widmann 07/03/2016 3:32 PM Location: [facility] Patient #: [id number] DOB: [birth year] Single / Language: [place] / Race: Black or African American Female  Patient Care Team: Renford Dills, MD as PCP - General (Internal Medicine) Thurmon Fair, MD as Consulting Physician (Cardiology) Karie Soda, MD as Consulting Physician (General Surgery)   History of Present Illness  The patient is a 66 year old female who presents with an inguinal hernia. Note for "Inguinal hernia": Patient referred by Dr. Doylene Canard in our group for concern of increasing inguinal and umbilical hernias in a patient with right-sided heart failure.  Pleasant 66 year old female. Found to have significant cardiac decline. Stenting and was under severe heart failure. Sided with ascites. Paracentesis and diuresis done. Ejection fraction around 25%. Followed by Dr. Jomarie Longs with [facility]. Much more stable now. Swelling in both groins noted. Left side has a constant lump. Had CAT scan done last March that showed small bowel in a large right inguinal hernia. Medium size left inguinal hernias with abdominal wall seroma. Umbilical hernia as well. Cardiology sent to [place] to consider surgical repair. Initially discouraged from surgery given her cardiac function. Second opinion requested, so sent to me. The patient notes that occasionally they bother her but nothing too severe. He does not exercise to regularly but notes that she can walk several blocks without much difficulty. She's not on oxygen. She's not had a paracentesis since last year. Hernias are getting larger ago. She is no longer on blood thinners since her stents in 2016. Denies any abdominal surgeries.  Cleared by cardiology  Allergies (April Staton, CMA; 07/03/2016 3:32 PM) No Known Drug Allergies 04/21/2016  Medication History (April Staton, [place]; 07/03/2016 3:32 PM) Hernia Belt Double Small (1 (one) Misc daily, Taken  starting 04/25/2016) Active. Atorvastatin Calcium (  Tablet, Oral) Active. Carvedilol (12.5MG  Tablet, Oral) Active. Vitamin D3 (2000UNIT Tablet, Oral) Active. Furosemide (  Tablet, Oral) Active. Magnesium Oxide (  Tablet, Oral) Active. MetOLazone (2.5MG  Tablet, Oral) Active. Potassium Chloride ER ( Tablet ER, Oral) Active. Spironolactone (  Tablet, Oral) Active. Valsartan (  Tablet, Oral) Active. Aspirin (  Tablet, Oral) Active. Medications Reconciled  Vitals (April Staton CMA; 07/03/2016 3:33 PM) 07/03/2016 3:32 PM Weight: 127.13 lb Height: 66in Body Surface Area: 1.65 m Body Mass Index: 20.52 kg/m  Temp.: 97.22F(Oral)  Pulse: 68 (Regular)  BP: 108/80 (Sitting, Left Arm, Standard)     BP 111/63   Pulse 67   Temp 97.6 F (36.4 C) (Oral)   Resp 18   Wt 61.2 kg (135 lb)   SpO2 98%   BMI 21.79 kg/m    Physical Exam [name] MD; 07/03/2016 6:28 PM) General Mental Status-Alert. General Appearance-Not in acute distress, Not Sickly. Orientation-Oriented X3. Hydration-Well hydrated. Voice-Normal.  Integumentary Global Assessment Upon inspection and palpation of skin surfaces of the - Axillae: non-tender, no inflammation or ulceration, no drainage. and Distribution of scalp and body hair is normal. General Characteristics Temperature - normal warmth is noted.  Head and Neck Head-normocephalic, atraumatic with no lesions or palpable masses. Face Global Assessment - atraumatic, no absence of expression. Neck Global Assessment - no abnormal movements, no bruit auscultated on the right, no bruit auscultated on the left, no decreased range of motion, non-tender. Trachea-midline. Thyroid Gland Characteristics - non-tender.  Eye Eyeball - Left-Extraocular movements intact, No Nystagmus. Eyeball - Right-Extraocular movements intact, No Nystagmus. Cornea - Left-No Hazy. Cornea - Right-No  Hazy. Sclera/Conjunctiva - Left-No scleral icterus, No Discharge. Sclera/Conjunctiva - Right-No scleral icterus, No Discharge. Pupil -  Left-Direct reaction to light normal. Pupil - Right-Direct reaction to light normal.  ENMT Ears Pinna - Left - no drainage observed, no generalized tenderness observed. Right - no drainage observed, no generalized tenderness observed. Nose and Sinuses External Inspection of the Nose - no destructive lesion observed. Inspection of the nares - Left - quiet respiration. Right - quiet respiration. Mouth and Throat Lips - Upper Lip - no fissures observed, no pallor noted. Lower Lip - no fissures observed, no pallor noted. Nasopharynx - no discharge present. Oral Cavity/Oropharynx - Tongue - no dryness observed. Oral Mucosa - no cyanosis observed. Hypopharynx - no evidence of airway distress observed.  Chest and Lung Exam Inspection Movements - Normal and Symmetrical. Accessory muscles - No use of accessory muscles in breathing. Palpation Palpation of the chest reveals - Non-tender. Auscultation Breath sounds - Normal and Clear.  Cardiovascular Auscultation Rhythm - Regular. Murmurs & Other Heart Sounds - Auscultation of the heart reveals - No Murmurs and No Systolic Clicks.  Abdomen Inspection Inspection of the abdomen reveals - No Visible peristalsis and No Abnormal pulsations. Umbilicus - No Bleeding, No Urine drainage. Palpation/Percussion Palpation and Percussion of the abdomen reveal - Soft, Non Tender, No Rebound tenderness, No Rigidity (guarding) and No Cutaneous hyperesthesia. Note: Moderate sized diastases recti with 15 mm umbilical hernia through stock soft and reducible. Abdomen mildly distended but no major fluid wave. Certainly no major ascites. No guarding. No other abdominal wall hernias   Female Genitourinary Sexual Maturity Tanner 5 - Adult hair pattern. Note: Large right inguinal hernia 9 cm mass soft and easily reducible to  more of a 2 cm defect consistent with inguinal hernia. Direct and indirect hernias small and reducible on the left side with a 8 x 4 cm ellipsoid firm but smooth mass consistent with chronic abdominal soreness trauma. That correlates with CAT scan. Seroma does not seem to be has reducible.   Peripheral Vascular Upper Extremity Inspection - Left - No Cyanotic nailbeds, Not Ischemic. Right - No Cyanotic nailbeds, Not Ischemic.  Neurologic Neurologic evaluation reveals -normal attention span and ability to concentrate, able to name objects and repeat phrases. Appropriate fund of knowledge , normal sensation and normal coordination. Mental Status Affect - not angry, not paranoid. Cranial Nerves-Normal Bilaterally. Gait-Normal.  Neuropsychiatric Mental status exam performed with findings of-able to articulate well with normal speech/language, rate, volume and coherence, thought content normal with ability to perform basic computations and apply abstract reasoning and no evidence of hallucinations, delusions, obsessions or homicidal/suicidal ideation.  Musculoskeletal Global Assessment Spine, Ribs and Pelvis - no instability, subluxation or laxity. Right Upper Extremity - no instability, subluxation or laxity.  Lymphatic Head & Neck  General Head & Neck Lymphatics: Bilateral - Description - No Localized lymphadenopathy. Axillary  General Axillary Region: Bilateral - Description - No Localized lymphadenopathy. Femoral & Inguinal  Generalized Femoral & Inguinal Lymphatics: Left - Description - No Localized lymphadenopathy. Right - Description - No Localized lymphadenopathy.    Assessment & Plan BILATERAL INGUINAL HERNIA WITHOUT OBSTRUCTION OR GANGRENE, RECURRENCE NOT SPECIFIED (K40.20) Impression: Right greater than left bilateral inguinal hernias. The right side with small bowel within it. There are for the most part reducible. Complex subcutaneous fluid collection on left side  most likely consistent with contiguous abdominal wall seroma.  Standard of care would be repair of hernias. Would do pre-peritoneal TEP approach to minimize incisions in the region. Would require general anesthesia. Would repair her periumbilical hernia as well. Perhaps a Like underlay mesh  repair as well.  Her biggest risk is heart failure and death. Her cardiologist cleared her.  She now wishes to have surgery.  Current Plans I recommended obtaining preoperative cardiac clearance. I am concerned about the health of the patient and the ability to tolerate the operation. Therefore, we will request clearance by cardiology to better assess operative risk & see if a reevaluation, further workup, etc is needed. Also recommendations on how medications such as for anticoagulation and blood pressure should be managed/held/restarted after surgery. You would benefit from surgery to repair the hernias at your bellybutton and in both groins. This would require general anesthesia. Observation the hospital with cardiology to make sure recover safely.  However you're not having severe symptoms near hernias, so you can continue to observe if you wish. If you wish to consider surgery, would like input from her cardiologist first.  If you have not heard from our office [phone]) in 5 working days, call the office and ask for your surgeon's nurse.  If you have other questions about your diagnosis, plan, or surgery, call the office and ask for your surgeon's nurse.   UMBILICAL HERNIA WITHOUT OBSTRUCTION AND WITHOUT GANGRENE (K42.9) Impression: Small but definite umbilical hernia in the setting of diastases recti. If she undergoes surgery, would plan repair of this as well. Most likely mesh underlay reinforcement DIASTASIS RECTI (M62.08) Current Plans Pt Education - [facility] Diastasis Recti: discussed with patient and provided information. GROIN FLUID COLLECTION (R18.8) Impression: Left groin fluid  collection most likely some chronic seroma contiguously related to left direct/indirect chronic inguinal hernia. Would try and reduce or at least decompresses intra-abdominally to help better control it. Weight anything percutaneously if possible given her thin tissues. We'll hold off on doing anything if she declines surgery.  [name], M.D., F.A.C.S. Gastrointestinal and Minimally Invasive [facility] [address] [place], [place] [address] [phone] Main / Paging

## 2016-09-28 NOTE — Op Note (Addendum)
09/28/2016  11:30 AM  PATIENT:  Sheri Dunn  66 y.o. female  Patient Care Team: Renford Dills, MD as PCP - General (Internal Medicine) Thurmon Fair, MD as Consulting Physician (Cardiology) Karie Soda, MD as Consulting Physician (General Surgery)  PRE-OPERATIVE DIAGNOSIS:    Bilateral inguinal hernias Umbilical hernia LLQ seromas   POST-OPERATIVE DIAGNOSIS:    Bilateral inguinal hernias Bilateral obturator hernias Umbilical hernia LLQ seromas  PROCEDURE:    LAPAROSCOPIC BILATERAL INGUINAL HERNIA REPAIRS WITH MESH LAPAROSCOPIC BILATERAL OBTURATOR HERNIA REPAIRS WITH MESH LAPAROSCOPIC UMBILICAL HERNIA REPAIR WITH MESH Needle aspiration of seroma INSERTION OF MESH  SURGEON:  [name], MD  ASSISTANT: None  ANESTHESIA:     Regional ilioinguinal and genitofemoral and spermatic cord nerve blocks  General  EBL:  Total I/O In: 1000 [IV Piggyback:1000] Out: 3500 ascites; Blood:200.  See anesthesia record  Delay start of Pharmacological VTE agent (>24hrs) due to surgical blood loss or risk of bleeding:  no  DRAINS: NONE  SPECIMEN:   NONE  DISPOSITION OF SPECIMEN:  N/A  COUNTS:  YES  PLAN OF CARE: Discharge to home after PACU  PATIENT DISPOSITION:  PACU - hemodynamically stable.  INDICATION: 66 year old female with ischemic cardiomyopathy and decreased ejection fraction of the relatively stable from a cardiac standpoint followed closely by cardiology. Having increasing the symptomatic and enlarging inguinal hernias. Right greater than left. Right small bowel within it. Umbilical hernia as well.  Sent for surgical evaluation. Initially not very symptomatic that they had become larger and become more severe and bothersome. Evaluation by cardiology noted increased surgical risks but relatively stable. Patient & family wished to proceed with surgery.  The anatomy & physiology of the abdominal wall and pelvic floor was discussed.  The pathophysiology of  hernias in the inguinal and pelvic region was discussed.  Natural history risks such as progressive enlargement, pain, incarceration & strangulation was discussed.   Contributors to complications such as smoking, obesity, diabetes, prior surgery, etc were discussed.    I feel the risks of no intervention will lead to serious problems that outweigh the operative risks; therefore, I recommended surgery to reduce and repair the hernia.  I explained laparoscopic techniques with possible need for an open approach.  I noted usual use of mesh to patch and/or buttress hernia repair  Risks such as bleeding, infection, abscess, need for further treatment, heart attack, death, and other risks were discussed.  I noted a good likelihood this will help address the problem.   Goals of post-operative recovery were discussed as well.  Possibility that this will not correct all symptoms was explained.  I stressed the importance of low-impact activity, aggressive pain control, avoiding constipation, & not pushing through pain to minimize risk of post-operative chronic pain or injury. Possibility of reherniation was discussed.  We will work to minimize complications.     An educational handout further explaining the pathology & treatment options was given as well.  Questions were answered.  The patient expresses understanding & wishes to proceed with surgery.  OR FINDINGS: Significant ascites. Changes on the liver suspicious for early cirrhosis  Right greater than left indirect inguinal hernias. Right side with large sac. Some atypical hernia sacs going into the left lower quadrant intramuscular layers. Bilateral obturator hernias. No femoral or direct space hernias.  3 x 2 cm umbilical hernia. Some omentum associated with but not incarcerated.  Type of repair: Laparoscopic underlay repair   Placement of mesh: Retrorectus underlay repair (lower 1/2.  Upper 1/2  intraperitoneal)  Name of mesh: Bard Ventralight dual  sided (polypropylene / Seprafilm)  Size of mesh: 20x15cm  Orientation: Vertical  Mesh overlap:  5-7cm  DESCRIPTION:  The patient was identified & brought into the operating room. The patient was positioned supine with arms tucked. SCDs were active during the entire case. The patient underwent general anesthesia without any difficulty.  The abdomen was prepped and draped in a sterile fashion. The patient's bladder was emptied.  A Surgical Timeout confirmed our plan.  I made a transverse incision through the inferior umbilical fold.  I made a small transverse nick through the anterior rectus fascia contralateral to the inguinal hernia side and placed a 0-vicryl stitch through the fascia.  I placed a Hasson trocar into the preperitoneal plane.  Entry was clean.  We induced carbon dioxide insufflation. Camera inspection revealed no injury.  I used a 10mm angled scope to bluntly free the peritoneum off the infraumbilical anterior abdominal wall.  I created enough of a preperitoneal pocket to place 5mm ports into the right & left mid-abdomen into this preperitoneal cavity.  I focused attention on the RIGHT pelvis since that was the dominant hernia side.   I used blunt & focused sharp dissection to free the peritoneum off the flank and down to the pubic rim.  I freed the anteriolateral bladder wall off the anteriolateral pelvic wall, sparing midline attachments.   I located a swath of peritoneum going into a hernia fascial defect at the  internal ring consistent with  an indirect inguinal hernia..  I gradually freed the peritoneal hernia sac off safely and reduced it into the preperitoneal space.  Patient had significant ascites. I aspirated 2 L of ascites to help decompress and better visualize the region. Anesthesia followed and replaced with albumin.   I freed the peritoneum off the round ligament.  I reduced an obvious obturator hernias well. No evidence of any femoral or direct space hernias. I freed  peritoneum off the retroperitoneum along the psoas muscle.  Cord lipoma was dissected away & removed.  I checked & assured hemostasis.     I turned attention on the opposite  LEFT pelvis.  I did dissection in a similar, mirror-image fashion. The patient had an indirect inguinal hernia..  Also reduced another obturator hernia as well.   The patient had a rather inflamed hernia sacs going up into the left lower quadrant intramuscular layers consistent with her chronic seromas in the left lower quadrant subcutaneous/abdominal wall. When she was able to reduce at least part and disrupt some of them. Later I needle aspirated the one remaining seroma in the left lower quadrant for better decompression. No direct space nor any femoral hernias. I checked & assured hemostasis.  As anticipated, surgical dissection was challenged by dense adhesions and poor planes resulting in the need for careful repair of the resulting hernia sac & peritoneal defects.  Repair was done with minimally invasive intracorporeal suturing using absorbable 2-0 vicryl suture.  There is some moderate oozing with the peroneal sac as it was chronically inflamed.  I chose 15x15 cm sheets of ultra-lightweight polypropylene mesh (Ultrapro), one for each side.  I cut a single sigmoid-shaped slit ~6cm from a corner of each mesh.  I placed the meshes into the preperitoneal space & laid them as overlapping diamonds such that at the inferior points, a 6x6 cm corner flap rested in the true anterolateral pelvis, covering the obturator & femoral foramina.   I allowed the bladder to  return to the pubis, this helping tuck the corners of the mesh in the anteriolateral pelvis.  The medial corners overlapped each other across midline cephalad to the pubic rim.   Given the numerous hernias of moderate size, I placed a third 15x15cm mesh in the center as a vertical diamond.  The lateral wings of the mesh overlap across the direct spaces and internal rings where the  dominant hernias were.  This provided good coverage and reinforcement of the hernia repairs.  Because of the numerous and large defects as well as her ascites and poor tissue quality, I did place tacks on the mesh above the pubic rim, along the rectus muscles, and superolaterally.   I held the hernia sacs cephalad & evacuated carbon dioxide.  I redirected with the ports into the peritoneal space.  I induced carbon dioxide insufflation. Camera inspection revealed no injury. Extra ports were carefully placed under direct laparoscopic visualization. Later I did aspirate another 1.5 L  of ascites later in the case. Again they replaced with 5% albumin as well.   I could see the hernia on the parietal peritoneum under the abdominal wall that was periumbilical. No omentum to which I freed off.  I did free off the inferior peritoneal flap of the anterior abdominal wall.  I mapped out the region.   To ensure that I would have at least 5 cm radial coverage outside of the hernia defect, I chose a 20x15cm dual sided mesh.  I placed #1 Prolene stitches around its edge about every 5 cm = 10 total.  I rolled the mesh & placed into the peritoneal cavity through the umbilical hernia defect.  I unrolled the mesh and positioned it appropriately.  I secured the mesh to cover up the hernia defect using a laparoscopic suture passer to pass the tails of the Prolene through the abdominal wall & tagged them with clamps for good transfascial suturing.  I started out in four corners to make sure I had the mesh centered under the hernia defect appropriately, and then proceeded to work in quadrants.    We evacuated CO2 & desufflated the abdomen.  I tied the fascial stitches down. I closed the fascial defect that I placed the mesh through using #1 PDS interrupted transverse stitches primarily.  I reinsufflated the abdomen. The mesh provided at least circumferential coverage around the entire region of hernia defects.  I secured the mesh  centrally with an additional trans fascial stitch in & out the mesh using #1 PDS under laparoscopic visualization.   I tacked the edges & central part of the mesh to the peritoneum/posterior rectus fascia with SecureStrap absorbable tacks.   I did reinspection. Hemostasis was good. Mesh laid well. I completed a broad field block of local anesthesia at fascial stitch sites & fascial closure areas.    Capnoperitoneum was evacuated. Ports were removed. Port site fascia was closed with 0 Vicryl given her thin wall poor quality and ascites. The skin was closed with Monocryl at the port sites and Steri-Strips on the fascial stitch puncture sites.  Patient is being extubated to go to the recovery room.  I discussed operative findings, updated the patient's status, discussed probable steps to recovery, and gave postoperative recommendations to the patient's family.  I recommended following the stepdown unit with cardiology consultation. Anticipate patient staying at least a few days. Perhaps only overnight, but we will see. Recommendations were made.  Questions were answered.  They expressed understanding & appreciation.  D/w  Trish with cardiology - cardiology will help follow & treat.    [name], M.D., F.A.C.S. Gastrointestinal and Minimally Invasive Surgery [facility] [address], [address] [place], [place] [address] [phone] Main / Paging  09/28/2016 11:30 AM
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
## 2016-09-28 NOTE — Progress Notes (Addendum)
RT at bedside at 1430 to attempt pressure support on ventilator and trail unsuccessful. Dr Michaell Cowing notified of patient status, still intubated in PACU off of all pressers. Will consult critical care, and cardiology and plan to admit to ICU. Dr Arby Barrette MDA in agreeance with sending patient intubated to ICU. Patient will respond to commands but is still on sedation. BP stable per A line 115/44 MAP 62.

## 2016-09-28 NOTE — Progress Notes (Signed)
Called eLink and spoke with MD Mannam regarding patient's chest xray results.

## 2016-09-29 ENCOUNTER — Encounter (HOSPITAL_COMMUNITY): Payer: Self-pay | Admitting: Surgery

## 2016-09-29 ENCOUNTER — Inpatient Hospital Stay (HOSPITAL_COMMUNITY): Payer: Medicare HMO

## 2016-09-29 DIAGNOSIS — J9601 Acute respiratory failure with hypoxia: Secondary | ICD-10-CM

## 2016-09-29 DIAGNOSIS — I5043 Acute on chronic combined systolic (congestive) and diastolic (congestive) heart failure: Secondary | ICD-10-CM

## 2016-09-29 DIAGNOSIS — E876 Hypokalemia: Secondary | ICD-10-CM

## 2016-09-29 DIAGNOSIS — I10 Essential (primary) hypertension: Secondary | ICD-10-CM

## 2016-09-29 DIAGNOSIS — Z95810 Presence of automatic (implantable) cardiac defibrillator: Secondary | ICD-10-CM

## 2016-09-29 DIAGNOSIS — J810 Acute pulmonary edema: Secondary | ICD-10-CM

## 2016-09-29 DIAGNOSIS — I2510 Atherosclerotic heart disease of native coronary artery without angina pectoris: Secondary | ICD-10-CM

## 2016-09-29 DIAGNOSIS — Z9861 Coronary angioplasty status: Secondary | ICD-10-CM

## 2016-09-29 LAB — GLUCOSE, CAPILLARY
GLUCOSE-CAPILLARY: 101 mg/dL — AB (ref 65–99)
GLUCOSE-CAPILLARY: 82 mg/dL (ref 65–99)
GLUCOSE-CAPILLARY: 59 mg/dL — AB (ref 65–99)
GLUCOSE-CAPILLARY: 118 mg/dL — AB (ref 65–99)
GLUCOSE-CAPILLARY: 149 mg/dL — AB (ref 65–99)
GLUCOSE-CAPILLARY: 146 mg/dL — AB (ref 65–99)
GLUCOSE-CAPILLARY: 161 mg/dL — AB (ref 65–99)
Glucose-Capillary: 77 mg/dL (ref 65–99)
Glucose-Capillary: 49 mg/dL — ABNORMAL LOW (ref 65–99)
Glucose-Capillary: 82 mg/dL (ref 65–99)
Glucose-Capillary: 104 mg/dL — ABNORMAL HIGH (ref 65–99)
Glucose-Capillary: 114 mg/dL — ABNORMAL HIGH (ref 65–99)
Glucose-Capillary: 126 mg/dL — ABNORMAL HIGH (ref 65–99)

## 2016-09-29 LAB — BASIC METABOLIC PANEL
ANION GAP: 6 (ref 5–15)
BUN: 27 mg/dL — ABNORMAL HIGH (ref 6–20)
CO2: 20 mmol/L — AB (ref 22–32)
Calcium: 8.1 mg/dL — ABNORMAL LOW (ref 8.9–10.3)
Chloride: 110 mmol/L (ref 101–111)
Creatinine, Ser: 0.91 mg/dL (ref 0.44–1.00)
GFR calc Af Amer: >60 mL/min (ref >60)
GFR calc non Af Amer: >60 mL/min (ref >60)
GLUCOSE: 161 mg/dL — AB (ref 65–99)
POTASSIUM: 3.8 mmol/L (ref 3.5–5.1)
Sodium: 136 mmol/L (ref 135–145)

## 2016-09-29 LAB — CBC
HEMATOCRIT: 26.1 % — AB (ref 36.0–46.0)
HEMOGLOBIN: 8.6 g/dL — AB (ref 12.0–15.0)
MCH: 27.1 pg (ref 26.0–34.0)
MCHC: 33 g/dL (ref 30.0–36.0)
MCV: 82.3 fL (ref 78.0–100.0)
Platelets: 131 10*3/uL — ABNORMAL LOW (ref 150–400)
RBC: 3.17 MIL/uL — ABNORMAL LOW (ref 3.87–5.11)
RDW: 16.6 % — ABNORMAL HIGH (ref 11.5–15.5)
WBC: 8.2 10*3/uL (ref 4.0–10.5)

## 2016-09-29 LAB — MAGNESIUM: MAGNESIUM: 1.8 mg/dL (ref 1.7–2.4)

## 2016-09-29 LAB — PHOSPHORUS: Phosphorus: 3.6 mg/dL (ref 2.5–4.6)

## 2016-09-29 MED ORDER — ALBUMIN HUMAN 5 % IV SOLN
12.5000 g | Freq: Three times a day (TID) | INTRAVENOUS | Status: AC | PRN
  Administered 2016-09-29 (×2): 12.5 g via INTRAVENOUS
  Filled 2016-09-29 (×2): qty 250

## 2016-09-29 MED ORDER — DEXTROSE 50 % IV SOLN
INTRAVENOUS | Status: AC
  Administered 2016-09-29: 50 mL
  Filled 2016-09-29: qty 50

## 2016-09-29 MED ORDER — MAGNESIUM SULFATE 2 GM/50ML IV SOLN: 2.0000 g | Freq: Once | INTRAVENOUS | Status: AC

## 2016-09-29 MED ORDER — POTASSIUM CHLORIDE CRYS ER 20 MEQ PO TBCR: 40.0000 meq | EXTENDED_RELEASE_TABLET | Freq: Once | ORAL | Status: AC

## 2016-09-29 MED ORDER — PHENOL 1.4 % MT LIQD: 1.0000 | OROMUCOSAL | Status: DC | PRN

## 2016-09-29 MED ORDER — ORAL CARE MOUTH RINSE
15.0000 mL | Freq: Two times a day (BID) | OROMUCOSAL | Status: DC
  Administered 2016-09-29 – 2016-10-08 (×15): 15 mL via OROMUCOSAL

## 2016-09-29 MED ORDER — DEXTROSE 50 % IV SOLN
INTRAVENOUS | Status: AC
  Administered 2016-09-29: 04:00:00
  Filled 2016-09-29: qty 50

## 2016-09-29 MED ORDER — FUROSEMIDE 10 MG/ML IJ SOLN: 20.0000 mg | Freq: Four times a day (QID) | INTRAMUSCULAR | Status: DC

## 2016-09-29 MED ORDER — KCL IN DEXTROSE-NACL 40-5-0.9 MEQ/L-%-% IV SOLN
INTRAVENOUS | Status: AC
  Administered 2016-09-29: 10:00:00 via INTRAVENOUS
  Filled 2016-09-29: qty 1000

## 2016-09-29 MED ORDER — MAGNESIUM SULFATE 2 GM/50ML IV SOLN
2.0000 g | Freq: Once | INTRAVENOUS | Status: AC
  Administered 2016-09-29: 2 g via INTRAVENOUS
  Filled 2016-09-29: qty 50

## 2016-09-29 MED ORDER — NOREPINEPHRINE BITARTRATE 1 MG/ML IV SOLN
0.0000 ug/min | INTRAVENOUS | Status: DC
  Administered 2016-09-30: 2 ug/min via INTRAVENOUS
  Filled 2016-09-29 (×2): qty 4

## 2016-09-29 MED ORDER — FUROSEMIDE 10 MG/ML IJ SOLN
20.0000 mg | Freq: Four times a day (QID) | INTRAMUSCULAR | Status: DC
  Administered 2016-09-29 (×2): 20 mg via INTRAVENOUS
  Filled 2016-09-29 (×3): qty 2

## 2016-09-29 MED ORDER — CARVEDILOL 12.5 MG PO TABS
12.5000 mg | ORAL_TABLET | Freq: Two times a day (BID) | ORAL | Status: DC
  Administered 2016-09-30 – 2016-10-01 (×3): 12.5 mg via ORAL
  Administered 2016-10-01: 6.25 mg via ORAL
  Administered 2016-10-02: 12.5 mg via ORAL
  Administered 2016-10-02 – 2016-10-03 (×2): 6.25 mg via ORAL
  Administered 2016-10-03 – 2016-10-05 (×4): 12.5 mg via ORAL
  Administered 2016-10-05: 6.5 mg via ORAL
  Administered 2016-10-06 – 2016-10-07 (×2): 12.5 mg via ORAL
  Filled 2016-09-29 (×15): qty 1

## 2016-09-29 NOTE — Progress Notes (Signed)
RN notified MD of patient's Bps sustaining 90s/50s-80s/40s with MAPS in the mid 50s.  No new orders given at this time.  Given patient's history of HF MD instructed RN to give LR bolus if systolic drops below 90 again.  RN giving bolus now (0502 started).  RN will continue to monitor pt very closely.

## 2016-09-29 NOTE — Progress Notes (Signed)
PT Cancellation Note  Patient Details Name: Halaina Vanduzer MRN: [id number] DOB: March 12, 1951   Cancelled Treatment:    Reason Eval/Treat Not Completed: Medical issues which prohibited therapy (pt not yet 4 hours post extubation. Will plan to eval next date)   Omesha Bowerman B Riaan [place]edo 09/29/2016, 12:19 PM  Delaney Meigs, PT [phone]

## 2016-09-29 NOTE — Progress Notes (Addendum)
PULMONARY / CRITICAL CARE MEDICINE   Name: Sheri Dunn MRN: [id number] DOB: 11-29-1950    ADMISSION DATE:  09/28/2016 CONSULTATION DATE:  09/28/16  REFERRING MD:  Michaell Cowing  CHIEF COMPLAINT:  Vent management post op  HISTORY OF PRESENT ILLNESS:  Pt is encephelopathic; therefore, this HPI is obtained from chart review. Sheri Dunn is a 66 y.o. female with PMH ias outlined below including EF 20%, gd2 DCHF, ischemic cardiomyopathy, HTN, CAD,  known inguinal and umbilical hernias, followed by Dr. Doylene Canard at [facility].  She was admitted 4/26 for elective surgical repair and underwent bilateral laparoscopic inguinal and obturator hernia repairs with mesh, laparoscopic umbilical hernia with mesh, needle aspiration of seroma, insertion of mesh (performed by Dr. Michaell Cowing).  Post op, she was unable to be extubated; therefore, PCCM was called to assist with vent management.  Of note, she is followed by Dr. Rubie Maid for known severe combined CHF (Echo from Oct 2017 with EF 20-25%, G2DD, PAP 47).  SUBJECTIVE:  On vent, awake, following commands.  VITAL SIGNS: BP (!) 81/45   Pulse (!) 53   Temp 97.5 F (36.4 C) (Oral)   Resp 16   Ht  (1.676 m)   Wt 135 lb (61.2 kg)   SpO2 99%   BMI 21.79 kg/m   HEMODYNAMICS:    VENTILATOR SETTINGS: Vent Mode: PRVC FiO2 (%):  [50 %-100 %] 50 % Set Rate:  [12 bmp-16 bmp] 16 bmp Vt Set:  [470 mL-550 mL] 470 mL PEEP:  [3 cmH20-5 cmH20] 3 cmH20 Plateau Pressure:  [13 cmH20-16 cmH20] 13 cmH20  INTAKE / OUTPUT: I/O last 3 completed shifts: In: 3268.9 [I.V.:2018.9; IV Piggyback:1250] Out: 3015 [Urine:815; Other:2000; Blood:200]  PHYSICAL EXAMINATION: General: Chronically ill appearing female, in NAD Neuro: awake, tries to speak but has ETT, following commands.  HEENT: [place]/AT. PERRL, sclerae anicteric. Cardiovascular: RRR, systolic murmur.  Lungs: Respirations even and unlabored.  CTA bilaterally, No W/R/R. Abdomen: Abdominal binder in  place.  Trochar sites noted, C/D/I. Musculoskeletal: No gross deformities, no edema.  Skin: Intact, warm, no rashes.  LABS:  BMET  Recent Labs Lab 09/28/16 1753 09/29/16 0350  NA 136 136  K 4.2 3.8  CL 109 110  CO2 20* 20*  BUN 29* 27*  CREATININE 1.04* 0.91  GLUCOSE 90 161*   Electrolytes  Recent Labs Lab 09/28/16 1753 09/29/16 0350  CALCIUM 8.3* 8.1*  MG 1.7 1.8  PHOS 3.8 3.6   CBC  Recent Labs Lab 09/28/16 1753 09/29/16 0350  WBC 9.0 8.2  HGB 8.9* 8.6*  HCT 26.9* 26.1*  PLT 135* 131*    Coag's No results for input(s): APTT, INR in the last 168 hours.  Sepsis Markers No results for input(s): LATICACIDVEN, PROCALCITON, O2SATVEN in the last 168 hours.  ABG No results for input(s): PHART, PCO2ART, PO2ART in the last 168 hours.  Liver Enzymes  Recent Labs Lab 09/28/16 1753  AST 20  ALT 11*  ALKPHOS 90  BILITOT 1.5*  ALBUMIN 2.8*    Cardiac Enzymes  Recent Labs Lab 09/28/16 1233 09/28/16 1753 09/28/16 2240  TROPONINI 0.06* 0.06* 0.06*    Glucose  Recent Labs Lab 09/28/16 2009 09/28/16 2139 09/29/16 0315 09/29/16 0333 09/29/16 0402 09/29/16 0514  GLUCAP 74 77 49* 82 101* 82    Imaging Dg Chest Port 1 View  Result Date: 09/28/2016 CLINICAL DATA:  Initial evaluation for acute respiratory failure with hypoxia. EXAM: PORTABLE CHEST 1 VIEW COMPARISON:  Prior radiograph from 06/17/2015. FINDINGS: Patient is intubated  with the tip of an endotracheal tube positioned 3.1 cm above the carina. Left-sided transvenous pacemaker/AICD in place. Moderate cardiomegaly, stable. Mediastinal silhouette normal. Aortic atherosclerosis. Lungs hypoinflated. Perihilar vascular congestion without overt pulmonary edema. Dense opacity within the retrocardiac left lower lobe may reflect atelectasis and/ or consolidation. Suspected left pleural effusion. No pneumothorax. Diffuse soft tissue emphysema seen within the bilateral chest walls and within the lower  left neck. No acute osseous abnormality. IMPRESSION: 1. Tip of the endotracheal tube 3.1 cm above the carina. 2. Dense opacity within the retrocardiac left lower lobe, which may reflect atelectasis and/or consolidation. Suspected small left pleural effusion. 3. Cardiomegaly with perihilar vascular congestion without overt pulmonary edema. 4. Scattered soft tissue emphysema within the chest wall bilaterally and lower left neck. Electronically Signed   By: Rise Mu M.D.   On: 09/28/2016 16:55    STUDIES:  CXR 4/27 > cardiomegaly, small left pleural eff.   CULTURES: None.  ANTIBIOTICS: None.  SIGNIFICANT EVENTS: 4/26 > elective surgery (bilateral laparoscopic inguinal and obturator hernia repairs with mesh, laparoscopic umbilical hernia with mesh, needle aspiration of seroma, insertion of mesh).  LINES/TUBES: ETT 4/26>>>4/27 R radial a line 4/26 >   DISCUSSION: 66 y.o. female admitted 4/26 for surgical repair of inguinal, obturator, and umbilical hernias.  Unable to be extubated post op so PCCM asked to assist with vent management.  ASSESSMENT / PLAN:  GASTROINTESTINAL A:   Bilateral inguinal and obturator along with umbilical hernias - s/p laparoscopic repair with mesth 4/26 (Dr. Michaell Cowing). Nutrition. P:   Post op care per CCS. PPI NPO  PULMONARY A: Respiratory insufficiency - due to inability to extubate post op. Combined CHF with EF 20-25% and dCHF P:   Now off pressors and well tolerated Hold cardiac medications while NPO  CARDIOVASCULAR A:  Hypotension intra op - required neo and levo; currently OFF pressors, has some hypotension.  Troponin bump - suspect demand ischemia, EKG ordered but not done. Trop max 0.06 Hx ICM / combined heart failure (Echo from Oct 2017 with EF 20-25%, G2DD), HTN, HLD, CAD. P:  Monitor hemodynamics. f/up EKG Hold lasix KVO IVF IVF via K replacement Continue preadmission ASA, atorvastatin. Hold preadmission carvedilol,  furosemide, metolazone, K-dur, valsartan, spironolactone.  RENAL A:   No acute issues. P:   KVO NS that was at 75 ml/hr Monitor and replace lytes as needed.  D5NS with 40 meq KCL at 50 ml/hr for 10 hours BMET in AM  HEMATOLOGIC A:   VTE Prophylaxis. Post op anemia P:  SCD's. F/U CBC, tx <7. Transfuse per ICU protocol  INFECTIOUS A:   No indication of infection. P:   Monitor clinically.  ENDOCRINE A:   No acute issues.   P:   No interventions required.  NEUROLOGIC A:   Acute encephalopathy - due to sedation. P:   Sedation:  Off now, has order for Precedex gtt / fentanyl PRN / midazolam PRN. RASS goal: 0. Extubate today.  Hyacinth Meeker, M.D.   Attending Note:  Above note edited in full.  66 year old female s/p hernia repair that was intubated post op.  On exam, patient is weaning this AM and following commands.  I reviewed CXR myself, pulmonary edema noted. Discussed with PCCM-resident.  Will extubate today.  Titrate O2 for sats.  Diet only if ok with surgery.  Hold off aggressive IVF given cardiac and pulmonary function.  IS per RT protocol.  Ambulate if ok with surgery.  PT evaluation.  The patient is critically ill with multiple organ systems failure and requires high complexity decision making for assessment and support, frequent evaluation and titration of therapies, application of advanced monitoring technologies and extensive interpretation of multiple databases.   Critical Care Time devoted to patient care services described in this note is  35  Minutes. This time reflects time of care of this signee Dr Jennet Maduro. This critical care time does not reflect procedure time, or teaching time or supervisory time of PA/NP/Med student/Med Resident etc but could involve care discussion time.  Rush Farmer, M.D. [facility] Pulmonary/Critical Care Medicine. Pager: [phone]. After hours pager: [phone].

## 2016-09-29 NOTE — Progress Notes (Signed)
eLink Physician-Brief Progress Note Patient Name: Sheri Dunn DOB: April 07, 1951 MRN: [id number]   Date of Service  09/29/2016  HPI/Events of Note  Call from bedside nurse reporting concern over paitent's resp status and diminished breath sounds in the bases bilaterally.  Paitent's PCXR this AM showed pulm edema with some subq air.  No PTX.  Camera check shows patient to be comfortable sleeping with no use of accessory muscles.  BP is soft at 90s systolic and RR of 35.  HR is 75 with sats of 97% on [place].  Nurse requesting BiPAP and PCXR  eICU Interventions  Plan: PRN BiPAP as tolerated by patient Hold on PCXR unless clinical condition deteriorates.      Intervention Category Intermediate Interventions: Other:  DETERDING,ELIZABETH 09/29/2016, 9:35 PM

## 2016-09-29 NOTE — Care Management Note (Signed)
Case Management Note Donn Pierini RN, BSN Unit 2W-Case Manager-- 2H coverage [phone]  Patient Details  Name: Sheri Dunn MRN: [id number] Date of Birth: 27-Mar-1951  Subjective/Objective:  Pt admitted 4/26 for elective surgical repair and underwent bilateral laparoscopic inguinal and obturator hernia repairs with mesh, laparoscopic umbilical hernia with mesh, needle aspiration of seroma, insertion of mesh (performed by Dr. Michaell Cowing).  Post op unable to extubate- remains on Vent                 Action/Plan: PTA pt lived at home-  Has family support with son and sisters- CM will follow for d/c needs   Expected Discharge Date:                 Expected Discharge Plan:     In-House Referral:     Discharge planning Services  CM Consult  Post Acute Care Choice:    Choice offered to:     DME Arranged:    DME Agency:     HH Arranged:    HH Agency:     Status of Service:  In process, will continue to follow  If discussed at Long Length of Stay Meetings, dates discussed:    Discharge Disposition:   Additional Comments:  Darrold Span, RN 09/29/2016, 10:23 AM

## 2016-09-29 NOTE — Progress Notes (Signed)
MD made aware of patient's BP 70s/40s after LR bolus.  No new orders given at this time.   MD Gross at bedside.  RN instructed to wean vent down to maintain O2 sats >92%.  FiO2 at 40% now.  BP 81/45.  O2 sat 99%.

## 2016-09-29 NOTE — Progress Notes (Signed)
Hypoglycemic Event  CBG: 59   Treatment: D50 IV 50 mL   Follow-up CBG: Time: 0715 CBG Result: 101  Possible Reasons for Event: Inadequate meal intake    Sheri Dunn

## 2016-09-29 NOTE — Progress Notes (Signed)
RT Note: Pt placed on Bipap per RN request. Pt resting comfortably BBS clear/diminished per pt she has not had any SOB since surgery. SPO2 97% on 3L . Pt placed on 10/5 with 3l bleed in. RR 20-30, No distress noted at this time, RT will continue to monitor

## 2016-09-29 NOTE — Procedures (Signed)
Extubation Procedure Note  Patient Details:   Name: Sheri Dunn DOB: [birth year] MRN: [id number]   Airway Documentation:     Evaluation  O2 sats: stable throughout Complications: No apparent complications Patient did tolerate procedure well. Bilateral Breath Sounds: Rhonchi, Diminished   Yes  PT was extubated to a 3L  Sats are stable  RT to monitor   Jasani Dolney, Duane Lope 09/29/2016, 10:15 AM

## 2016-09-29 NOTE — Progress Notes (Signed)
Hypoglycemic Event  CBG: 49   Treatment: D50 IV 50 mL  Symptoms: hypotension, shaky?  Follow-up CBG: Time: 0333 CBG Result: 82  Possible Reasons for Event: Inadequate meal intake      Jeannie Fend

## 2016-09-29 NOTE — Progress Notes (Addendum)
[facility]  [address] [place], [place] [address] Phone: [phone] FAX: [phone]   Sheri Dunn [id number] 1951-05-06    Problem List:   Principal Problem:   Ischemic cardiomyopathy Active Problems:   Essential hypertension   CAD S/P PCI April 2016   ICD (implantable cardioverter-defibrillator) in place   Bilateral inguinal hernias s/p lap repair with mesh 09/28/2016   Obturator hernias, bilateral, s/p lap repair with mesh 4/88/8916   Umbilical hernia s/p lap repair with mesh 09/28/2016   Inguinal hernia   1 Day Post-Op  09/28/2016  POST-OPERATIVE DIAGNOSIS:    Bilateral inguinal hernias Bilateral obturator hernias Umbilical hernia LLQ seromas  PROCEDURE:    LAPAROSCOPIC BILATERAL INGUINAL HERNIA REPAIRS WITH MESH LAPAROSCOPIC BILATERAL OBTURATOR HERNIA REPAIRS WITH MESH LAPAROSCOPIC UMBILICAL HERNIA REPAIR WITH MESH Needle aspiration of seroma INSERTION OF MESH  SURGEON:  Adin Hector, MD    Assessment  Guarded  Plan:  -PRN boluses - switch to colloid with CHF & ascites to avoid fluid overload.  Cardiology & CCM may adjust -weaning FiO2 - hopefully extubate this AM.  Had d/w Dr Titus Mould yesterday. -BP per cardiology.  Keep Coreg especially per Dr Lurline Del outpt notes, but may need to hold the rest as needed.  Called & d/w Dr Percival Spanish & ask they see.  They will see this AM -pain control -troponins low - doubt MI.  Defer to cardiology  -can try PO after extubation.  Liquids only today until BP & sats better -VTE prophylaxis- SCDs, etc -mobilize as tolerated to help recovery  Adin Hector, M.D., F.A.C.S. Gastrointestinal and Minimally Invasive Surgery [facility] [address] [place], [place] [address] [phone] Main / Paging   09/29/2016  CARE TEAM:  PCP: Kandice Hams, MD  Outpatient Care Team: Patient Care Team: [name], MD as PCP -  General (Internal Medicine) Sanda Klein, MD as Consulting Physician (Cardiology) Michael Boston, MD as Consulting Physician (General Surgery)  Inpatient Treatment Team: Treatment Team: Attending Provider: Michael Boston, MD; Consulting Physician: Sanda Klein, MD; Consulting Physician: Michae Kava Lbcardiology, MD; Consulting Physician: Md Pccm, MD; Consulting Physician: Raylene Miyamoto, MD; Rounding Team: Md Pccm, MD; Technician: Stephanie Coup, NT  Subjective:  Awake on vent Denies pain BP low - received LR 520m bolus x1.  Otherwise medlocked  Objective:  Vital signs:  Vitals:   09/29/16 0530 09/29/16 0545 09/29/16 0600 09/29/16 0615  BP: (!) 88/52 (!) 90/45 (!) 93/45 (!) 71/60  Pulse: (!) 50 (!) 48 (!) 48 (!) 55  Resp: _0 Temp:      TempSrc:      SpO2: 100% 100% 100% 100%  Weight:      Height:        Last BM Date:  (PTA)  Intake/Output   Yesterday:  04/26 0701 - 04/27 0700 In: 3193.9 [I.V.:1943.9; IV Piggyback:1250] Out: 2840 [Urine:640; Blood:200] This shift:  Total I/O In: 782.2 [I.V.:782.2] Out: 390 [Urine:390]  Bowel function:  Flatus: No  BM:  No  Drain: (No drain)   Physical Exam:  General: Pt awake/alert/oriented x4 inno acute distress Eyes: PERRL, normal EOM.  Sclera clear.  No icterus Neuro: CN II-XII intact w/o focal sensory/motor deficits. Lymph: No head/neck/groin lymphadenopathy Psych:  No delerium/psychosis/paranoia HENT: Normocephalic, Mucus membranes moist.  No thrush.  ETT in place Neck: Supple, No tracheal deviation Chest: No chest wall pain w good excursion CV:  Pulses intact.  Regular rhythm  MS: Normal AROM mjr joints.  No obvious deformity Abdomen: Somewhat firm.  Moderately distended.  Nontender.  Incisions c/d/I.  DRY.  Old hernia sacs reducible in both groins.  No evidence of peritonitis.  No incarcerated hernias. Ext:  SCDs BLE.  No mjr edema.  No cyanosis Skin: No petechiae / purpura  Results:   Labs: Results  for orders placed or performed during the hospital encounter of 09/28/16 (from the past 48 hour(s))  CK total and CKMB (cardiac)not at [facility]     Status: None   Collection Time: 09/28/16 12:33 PM  Result Value Ref Range   Total CK 75 38 - 234 U/L   CK, MB 2.2 0.5 - 5.0 ng/mL   Relative Index RELATIVE INDEX IS INVALID 0.0 - 2.5    Comment: WHEN CK < 100 U/L          Troponin I (q 6hr x 3)     Status: Abnormal   Collection Time: 09/28/16 12:33 PM  Result Value Ref Range   Troponin I 0.06 (HH) <0.03 ng/mL    Comment: CRITICAL RESULT CALLED TO, READ BACK BY AND VERIFIED WITHAvis Epley [id number] WILDERK   MRSA PCR Screening     Status: None   Collection Time: 09/28/16  5:01 PM  Result Value Ref Range   MRSA by PCR NEGATIVE NEGATIVE    Comment:        The GeneXpert MRSA Assay (FDA approved for NASAL specimens only), is one component of a comprehensive MRSA colonization surveillance program. It is not intended to diagnose MRSA infection nor to guide or monitor treatment for MRSA infections.   Troponin I (q 6hr x 3)     Status: Abnormal   Collection Time: 09/28/16  5:53 PM  Result Value Ref Range   Troponin I 0.06 (HH) <0.03 ng/mL    Comment: CRITICAL VALUE NOTED.  VALUE IS CONSISTENT WITH PREVIOUSLY REPORTED AND CALLED VALUE.  Comprehensive metabolic panel     Status: Abnormal   Collection Time: 09/28/16  5:53 PM  Result Value Ref Range   Sodium 136 135 - 145 mmol/L   Potassium 4.2 3.5 - 5.1 mmol/L   Chloride 109 101 - 111 mmol/L   CO2 20 (L) 22 - 32 mmol/L   Glucose, Bld 90 65 - 99 mg/dL   BUN 29 (H) 6 - 20 mg/dL   Creatinine, Ser 1.04 (H) 0.44 - 1.00 mg/dL   Calcium 8.3 (L) 8.9 - 10.3 mg/dL   Total Protein 4.6 (L) 6.5 - 8.1 g/dL   Albumin 2.8 (L) 3.5 - 5.0 g/dL   AST 20 15 - 41 U/L   ALT 11 (L) 14 - 54 U/L   Alkaline Phosphatase 90 38 - 126 U/L   Total Bilirubin 1.5 (H) 0.3 - 1.2 mg/dL   GFR calc non Af Amer 54 (L) >60 mL/min   GFR calc Af Amer >60 >60 mL/min     Comment: (NOTE) The eGFR has been calculated using the CKD EPI equation. This calculation has not been validated in all clinical situations. eGFR's persistently <60 mL/min signify possible Chronic Kidney Disease.    Anion gap 7 5 - 15  CBC     Status: Abnormal   Collection Time: 09/28/16  5:53 PM  Result Value Ref Range   WBC 9.0 4.0 - 10.5 K/uL   RBC 3.25 (L) 3.87 - 5.11 MIL/uL   Hemoglobin 8.9 (L) 12.0 - 15.0 g/dL   HCT 26.9 (L) 36.0 - 46.0 %  MCV 82.8 78.0 - 100.0 fL   MCH 27.4 26.0 - 34.0 pg   MCHC 33.1 30.0 - 36.0 g/dL   RDW 16.6 (H) 11.5 - 15.5 %   Platelets 135 (L) 150 - 400 K/uL  Magnesium     Status: None   Collection Time: 09/28/16  5:53 PM  Result Value Ref Range   Magnesium 1.7 1.7 - 2.4 mg/dL  Phosphorus     Status: None   Collection Time: 09/28/16  5:53 PM  Result Value Ref Range   Phosphorus 3.8 2.5 - 4.6 mg/dL  Glucose, capillary     Status: None   Collection Time: 09/28/16  8:09 PM  Result Value Ref Range   Glucose-Capillary 74 65 - 99 mg/dL   Comment 1 Capillary Specimen   Glucose, capillary     Status: None   Collection Time: 09/28/16  9:39 PM  Result Value Ref Range   Glucose-Capillary 77 65 - 99 mg/dL   Comment 1 Capillary Specimen   Troponin I (q 6hr x 3)     Status: Abnormal   Collection Time: 09/28/16 10:40 PM  Result Value Ref Range   Troponin I 0.06 (HH) <0.03 ng/mL    Comment: CRITICAL VALUE NOTED.  VALUE IS CONSISTENT WITH PREVIOUSLY REPORTED AND CALLED VALUE.  Glucose, capillary     Status: Abnormal   Collection Time: 09/29/16  3:15 AM  Result Value Ref Range   Glucose-Capillary 49 (L) 65 - 99 mg/dL   Comment 1 Capillary Specimen   Glucose, capillary     Status: None   Collection Time: 09/29/16  3:33 AM  Result Value Ref Range   Glucose-Capillary 82 65 - 99 mg/dL   Comment 1 Capillary Specimen   Magnesium     Status: None   Collection Time: 09/29/16  3:50 AM  Result Value Ref Range   Magnesium 1.8 1.7 - 2.4 mg/dL  Phosphorus      Status: None   Collection Time: 09/29/16  3:50 AM  Result Value Ref Range   Phosphorus 3.6 2.5 - 4.6 mg/dL  Basic metabolic panel     Status: Abnormal   Collection Time: 09/29/16  3:50 AM  Result Value Ref Range   Sodium 136 135 - 145 mmol/L   Potassium 3.8 3.5 - 5.1 mmol/L   Chloride 110 101 - 111 mmol/L   CO2 20 (L) 22 - 32 mmol/L   Glucose, Bld 161 (H) 65 - 99 mg/dL   BUN 27 (H) 6 - 20 mg/dL   Creatinine, Ser 0.91 0.44 - 1.00 mg/dL   Calcium 8.1 (L) 8.9 - 10.3 mg/dL   GFR calc non Af Amer >60 >60 mL/min   GFR calc Af Amer >60 >60 mL/min    Comment: (NOTE) The eGFR has been calculated using the CKD EPI equation. This calculation has not been validated in all clinical situations. eGFR's persistently <60 mL/min signify possible Chronic Kidney Disease.    Anion gap 6 5 - 15  CBC     Status: Abnormal   Collection Time: 09/29/16  3:50 AM  Result Value Ref Range   WBC 8.2 4.0 - 10.5 K/uL   RBC 3.17 (L) 3.87 - 5.11 MIL/uL   Hemoglobin 8.6 (L) 12.0 - 15.0 g/dL   HCT 26.1 (L) 36.0 - 46.0 %   MCV 82.3 78.0 - 100.0 fL   MCH 27.1 26.0 - 34.0 pg   MCHC 33.0 30.0 - 36.0 g/dL   RDW 16.6 (H) 11.5 - 15.5 %  Platelets 131 (L) 150 - 400 K/uL  Glucose, capillary     Status: Abnormal   Collection Time: 09/29/16  4:02 AM  Result Value Ref Range   Glucose-Capillary 101 (H) 65 - 99 mg/dL   Comment 1 Capillary Specimen   Glucose, capillary     Status: None   Collection Time: 09/29/16  5:14 AM  Result Value Ref Range   Glucose-Capillary 82 65 - 99 mg/dL   Comment 1 Capillary Specimen     Imaging / Studies: Dg Chest Port 1 View  Result Date: 09/28/2016 CLINICAL DATA:  Initial evaluation for acute respiratory failure with hypoxia. EXAM: PORTABLE CHEST 1 VIEW COMPARISON:  Prior radiograph from 06/17/2015. FINDINGS: Patient is intubated with the tip of an endotracheal tube positioned 3.1 cm above the carina. Left-sided transvenous pacemaker/AICD in place. Moderate cardiomegaly, stable.  Mediastinal silhouette normal. Aortic atherosclerosis. Lungs hypoinflated. Perihilar vascular congestion without overt pulmonary edema. Dense opacity within the retrocardiac left lower lobe may reflect atelectasis and/ or consolidation. Suspected left pleural effusion. No pneumothorax. Diffuse soft tissue emphysema seen within the bilateral chest walls and within the lower left neck. No acute osseous abnormality. IMPRESSION: 1. Tip of the endotracheal tube 3.1 cm above the carina. 2. Dense opacity within the retrocardiac left lower lobe, which may reflect atelectasis and/or consolidation. Suspected small left pleural effusion. 3. Cardiomegaly with perihilar vascular congestion without overt pulmonary edema. 4. Scattered soft tissue emphysema within the chest wall bilaterally and lower left neck. Electronically Signed   By: Jeannine Boga M.D.   On: 09/28/2016 16:55    Medications / Allergies: per chart  Antibiotics: Anti-infectives    Start     Dose/Rate Route Frequency Ordered Stop   09/28/16 0616  ceFAZolin (ANCEF) 2-4 GM/100ML-% IVPB    Comments:  Precious Haws   : cabinet override      09/28/16 0616 09/28/16 0756   09/28/16 0612  ceFAZolin (ANCEF) IVPB 2g/100 mL premix     2 g 200 mL/hr over 30 Minutes Intravenous On call to O.R. 09/28/16 2482 09/28/16 0756        Note: Portions of this report may have been transcribed using voice recognition software. Every effort was made to ensure accuracy; however, inadvertent computerized transcription errors may be present.   Any transcriptional errors that result from this process are unintentional.     Adin Hector, M.D., F.A.C.S. Gastrointestinal and Minimally Invasive Surgery [facility] [address], [place], [place] [address] [phone] Main / Paging   09/29/2016

## 2016-09-29 NOTE — Consult Note (Signed)
CARDIOLOGY CONSULT NOTE   Patient ID: Sheri Dunn MRN: [id number] DOB/AGE: [birth year] 68 y.o.  Admit date: 09/28/2016  Requesting Physician: Dr. Johney Maine  Primary Physician:   Sheri Hams, MD Primary Cardiologist:  Dr. Sallyanne Kuster Reason for Consultation: peri operative hypotension and ECG changes   Sheri Dunn is a 66 y.o. female who is being seen today for the evaluation of peri operative hypotension and ECG changes at the request of Dr. Johney Maine.   HPI: Sheri Dunn is a 66 y.o. female with a history of severe ischemic cardiomyopathy, chronic systolic and diastolic biventricular HF, CAD s/p NSTEMI and DES to RCA and LCX 09/2014, s/p ICD November 2016 (Medtronic Visia AF), PAFlutter, AI, HLD, who presented to [facility] on 09/28/16 for elective hernia repair. Cardiology consulted for perioperative hypotension and transient ECG changes.   She initially presented with heart disease in April 2016 when she had a non-ST segment elevation myocardial infarction. She received a drug-eluting stent to the mid circumflex coronary artery and another drug-eluting stent to distal right coronary artery. Ejection fraction at that time was 20% by angiography, 25-30% by echocardiographyand failed to improve on medical therapy (still 20-25% on echo July 2016). She has a relatively narrow QRS complex on ECG. She received a Medtronic Visia AF defibrillator in 04/2015.  Echo on March 31, 2016 showed LVEF 20-25%, pseudonormal mitral inflow, moderate aortic insufficiency, moderate mitral insufficiency, moderate enlargement of the right heart chambers with moderate tricuspid insufficiency and estimated systolic PA pressure of 47 mmHg.  She has a history of paroxysmal atrial flutter, but no recent recurrence. Plan Is to hold off on oral anticoagulation unless fib/flutter detected on ICD.   She was recently seen by Dr. Sallyanne Kuster in the office on 07/14/16. She was above her "dry weight" of 120 lbs, but was felt to  be compensated. Weight 126 lbs that day. She was felt to have ascites from RHF and not intrinsic liver disease. She was felt stable for bilateral hernia repair with at least moderate cardiovascular risk.   She presented to [facility] on 09/28/16 for planned laparoscopic bilateral laparoscopic inguinal and obturator hernia repairs with mesh, laparoscopic umbilical hernia with mesh, needle aspiration of seroma, insertion of mesh with Dr. Johney Maine. Surgery was successfully completed. She was hypotensive requiring a short period of neo and levo. There was some transient TWIs on telemetry perioperatively. Troponin returned at 0.06--> 0.06--> 0.06.   Post op, she was unable to be extubated; therefore, PCCM was called to assist with vent management.  ECG today showed sinus bradycardia HR 54 with TWI in inferior leads, similar to previous tracings. Currently patient is intubated but has been weaning well this AM, per RN. precedex has been discontinued and she is now alert and oriented. It is difficult to obtained a history given ventilator but she is able to tell me that she has no chest pain.    Past Medical History:  Diagnosis Date  . CAD (coronary artery disease)    a. NSTEMI 4/16:  LHC - mCFX 90, dRCA 90, EF 20% >> PCI: Synergy DES to mCFX and Synergy DES to dRCA  . Chronic combined systolic and diastolic CHF (congestive heart failure) ([place])   . Dyspnea   . Elevated ALT measurement   . HLD (hyperlipidemia)   . Hypertension   . Ischemic cardiomyopathy    a. Echo 4/16:  EF 25-30%, diff HK, ant-septal HK, Gr 1 DD, mod AI, mod LAE, mild RVE;  b.  Echo 7/16:  EF 20-25%, severe HK, Gr 1 DD, mod AI, mod MR, mod LAE, mod RVE, mod reduced RVSF, mild RAE  . Prolonged Q-T interval on ECG      Past Surgical History:  Procedure Laterality Date  . CARDIAC CATHETERIZATION Right 09/08/2014   Procedure: CORONARY STENT INTERVENTION;  Surgeon: Jettie Booze, MD;  Location: [facility] CATH LAB;  Service: Cardiovascular;   Laterality: Right;  . COLONOSCOPY    . EP IMPLANTABLE DEVICE N/A 04/27/2015   Procedure: ICD Implant;  Surgeon: Sanda Klein, MD; Medtronic Visia AF, Model [id number] serial number [id number]   . LEFT HEART CATHETERIZATION WITH CORONARY ANGIOGRAM N/A 09/08/2014   Procedure: LEFT HEART CATHETERIZATION WITH CORONARY ANGIOGRAM;  Surgeon: Jettie Booze, MD;  Location: [facility] CATH LAB;  Service: Cardiovascular;  Laterality: N/A;    Allergies  Allergen Reactions  . No Known Allergies     I have reviewed the patient's current medications . aspirin  81 mg Per Tube Daily  . atorvastatin  20 mg Per Tube q1800  . carvedilol  12.5 mg Oral BID WC  . chlorhexidine gluconate (MEDLINE KIT)  15 mL Mouth Rinse BID  . cholecalciferol  2,000 Units Oral Daily  . enoxaparin (LOVENOX) injection  40 mg Subcutaneous Q24H  . irbesartan  150 mg Oral Daily  . lip balm  1 application Topical BID  . mouth rinse  15 mL Mouth Rinse 10 times per day  . potassium chloride  10 mEq Oral Daily  . sodium chloride flush  3 mL Intravenous Q12H   . sodium chloride    . sodium chloride 75 mL/hr at 09/29/16 0647  . albumin human    . dexmedetomidine Stopped (09/29/16 0039)  . methocarbamol (ROBAXIN)  IV     sodium chloride, acetaminophen, acetaminophen, albumin human, alum & mag hydroxide-simeth, bisacodyl, diphenhydrAMINE **OR** diphenhydrAMINE, enalaprilat, fentaNYL (SUBLIMAZE) injection, fentaNYL (SUBLIMAZE) injection, hydrALAZINE, hydrALAZINE, HYDROcodone-acetaminophen, HYDROmorphone (DILAUDID) injection, magic mouthwash, menthol-cetylpyridinium, methocarbamol (ROBAXIN)  IV, methocarbamol, methocarbamol, midazolam, midazolam, ondansetron **OR** ondansetron (ZOFRAN) IV, phenol, polyethylene glycol, prochlorperazine, simethicone, sodium chloride flush  Prior to Admission medications   Medication Sig Start Date End Date Taking? Authorizing Provider  aspirin 81 MG tablet Take 81 mg by mouth daily.   Yes Historical  Provider, MD  atorvastatin (LIPITOR) 20 MG tablet Take 1 tablet (20 mg total) by mouth daily. 12/24/15  Yes Mihai Croitoru, MD  carvedilol (COREG) 12.5 MG tablet Take 1 tablet (12.5 mg total) by mouth 2 (two) times daily with a meal. 03/23/16  Yes Mihai Croitoru, MD  Cholecalciferol (VITAMIN D3) 2000 UNITS TABS Take 2,000 Units by mouth daily.   Yes Historical Provider, MD  furosemide (LASIX) 40 MG tablet take 2 tablets (80 MG) by mouth once daily WHEN YOUR WEIGHT REACHES 117 POUNDS REDUCE BACK TO 1 TABLET (40MG) EVERY DAY 05/26/16  Yes Mihai Croitoru, MD  metolazone (ZAROXOLYN) 2.5 MG tablet Take 1 tablet (2.5 mg total) by mouth 3 (three) times a week. 30 minutes before taking Lasix on Mondays, Wednesdays, and Fridays. 11/17/15  Yes Mihai Croitoru, MD  potassium chloride (K-DUR) 10 MEQ tablet Take 1 tablet (10 mEq total) by mouth daily. 05/01/16  Yes Mihai Croitoru, MD  valsartan (DIOVAN) 160 MG tablet take 1 tablet by mouth once daily 09/19/16  Yes Mihai Croitoru, MD  HYDROcodone-acetaminophen (NORCO/VICODIN) 5-325 MG tablet Take 1-2 tablets by mouth every 4 (four) hours as needed for moderate pain or severe pain. 09/28/16   Michael Boston, MD  spironolactone (ALDACTONE) 25 MG tablet  Take 1 tablet (25 mg total) by mouth daily. 04/12/16 07/11/16  Sanda Klein, MD     Social History   Social History  . Marital status: Single    Spouse name: N/A  . Number of children: 1  . Years of education: N/A   Occupational History  . Not on file.   Social History Main Topics  . Smoking status: Never Smoker  . Smokeless tobacco: Never Used  . Alcohol use No  . Drug use: No  . Sexual activity: No   Other Topics Concern  . Not on file   Social History Narrative  . No narrative on file    Family Status  Relation Status  . Mother Deceased  . Father Deceased  . Sister    Family History  Problem Relation Age of Onset  . Heart attack Mother   . Stroke Mother   . Diabetes Mother   . Cystic fibrosis  Sister        ROS:  Full 14 point review of systems complete and found to be negative unless listed above.  Physical Exam: Blood pressure (!) 81/45, pulse (!) 53, temperature 97.5 F (36.4 C), temperature source Oral, resp. rate 16, height 5' 6"  (1.676 m), weight 135 lb (61.2 kg), SpO2 99 %.  General: Well developed, well nourished, female in no acute distress, chronically ill appearing, ventilated, following commands Head: Eyes PERRLA, No xanthomas.   Normocephalic and atraumatic, oropharynx without edema or exudate. Lungs: ventilated  Heart: HRRR S1 S2, no rub/gallop, Heart regular rate and rhythm with S1, S2  + murmur. pulses are 2+ extrem.   Neck: No carotid bruits. No lymphadenopathy.  No JVD. Abdomen: +ascites. She has a binder on. Msk:  No spine or cva tenderness. No weakness, no joint deformities or effusions. Extremities: No clubbing or cyanosis. no edema.  Neuro: ventilated, cannot speak but nods head appropriately.  Psych:  Good affect, responds appropriately Skin: No rashes or lesions noted.  Labs:   Lab Results  Component Value Date   WBC 8.2 09/29/2016   HGB 8.6 (L) 09/29/2016   HCT 26.1 (L) 09/29/2016   MCV 82.3 09/29/2016   PLT 131 (L) 09/29/2016   No results for input(s): INR in the last 72 hours.  Recent Labs Lab 09/28/16 1753 09/29/16 0350  NA 136 136  K 4.2 3.8  CL 109 110  CO2 20* 20*  BUN 29* 27*  CREATININE 1.04* 0.91  CALCIUM 8.3* 8.1*  PROT 4.6*  --   BILITOT 1.5*  --   ALKPHOS 90  --   ALT 11*  --   AST 20  --   GLUCOSE 90 161*  ALBUMIN 2.8*  --    Magnesium  Date Value Ref Range Status  09/29/2016 1.8 1.7 - 2.4 mg/dL Final    Recent Labs  09/28/16 1233 09/28/16 1753 09/28/16 2240  CKTOTAL 75  --   --   CKMB 2.2  --   --   TROPONINI 0.06* 0.06* 0.06*   No results for input(s): TROPIPOC in the last 72 hours. Pro B Natriuretic peptide (BNP)  Date/Time Value Ref Range Status  03/09/2013 04:46 PM 322.4 (H) 0 - 125 pg/mL Final    Lab Results  Component Value Date   CHOL 92 (L) 01/10/2016   HDL 33 (L) 01/10/2016   LDLCALC 45 01/10/2016   TRIG 69 01/10/2016   Lab Results  Component Value Date   DDIMER 1.41 (H) 09/05/2014   Lipase  Date/Time  Value Ref Range Status  10/07/2014 09:08 PM 25 22 - 51 U/L Final   TSH  Date/Time Value Ref Range Status  09/05/2014 11:50 PM 1.940 0.350 - 4.500 uIU/mL Final   Ferritin  Date/Time Value Ref Range Status  03/01/2015 02:59 PM 36 15 - 150 ng/mL Final   Iron  Date/Time Value Ref Range Status  03/01/2015 02:59 PM 48 42 - 145 ug/dL Final    Echo: 03/31/17 LV EF: 20% -   25% Study Conclusions - Left ventricle: The cavity size was mildly dilated. Wall   thickness was normal. Systolic function was severely reduced. The   estimated ejection fraction was in the range of 20% to 25%.   Severe diffuse hypokinesis. Features are consistent with a   pseudonormal left ventricular filling pattern, with concomitant   abnormal relaxation and increased filling pressure (grade 2   diastolic dysfunction). No evidence of thrombus. - Aortic valve: There was moderate regurgitation. - Mitral valve: There was moderate regurgitation. - Left atrium: The atrium was severely dilated. - Right ventricle: The cavity size was moderately dilated. Systolic   function was moderately reduced. - Right atrium: The atrium was severely dilated. - Tricuspid valve: There was moderate regurgitation. - Pulmonary arteries: Systolic pressure was mildly to moderately   increased. PA peak pressure: 47 mm Hg (S). - Pericardium, extracardiac: A trivial pericardial effusion was   identified.  ECG:  ECG today showed sinus bradycardia HR 54 with TWI in inferior leads, similar to previous tracings.  - personally reviewed  TELE: NSR and sinus brady - personally reviewed  Radiology:  Dg Chest Port 1 View  Result Date: 09/28/2016 CLINICAL DATA:  Initial evaluation for acute respiratory failure with hypoxia.  EXAM: PORTABLE CHEST 1 VIEW COMPARISON:  Prior radiograph from 06/17/2015. FINDINGS: Patient is intubated with the tip of an endotracheal tube positioned 3.1 cm above the carina. Left-sided transvenous pacemaker/AICD in place. Moderate cardiomegaly, stable. Mediastinal silhouette normal. Aortic atherosclerosis. Lungs hypoinflated. Perihilar vascular congestion without overt pulmonary edema. Dense opacity within the retrocardiac left lower lobe may reflect atelectasis and/ or consolidation. Suspected left pleural effusion. No pneumothorax. Diffuse soft tissue emphysema seen within the bilateral chest walls and within the lower left neck. No acute osseous abnormality. IMPRESSION: 1. Tip of the endotracheal tube 3.1 cm above the carina. 2. Dense opacity within the retrocardiac left lower lobe, which may reflect atelectasis and/or consolidation. Suspected small left pleural effusion. 3. Cardiomegaly with perihilar vascular congestion without overt pulmonary edema. 4. Scattered soft tissue emphysema within the chest wall bilaterally and lower left neck. Electronically Signed   By: Jeannine Boga M.D.   On: 09/28/2016 16:55    ASSESSMENT AND PLAN:    Principal Problem:   Ischemic cardiomyopathy Active Problems:   Essential hypertension   CAD S/P PCI April 2016   ICD (implantable cardioverter-defibrillator) in place   Bilateral inguinal hernias s/p lap repair with mesh 09/28/2016   Obturator hernias, bilateral, s/p lap repair with mesh 6/54/6503   Umbilical hernia s/p lap repair with mesh 09/28/2016   Inguinal hernia   Sheri Dunn is a 66 y.o. female with a history of severe ischemic cardiomyopathy, chronic systolic and diastolic biventricular HF, CAD s/p NSTEMI and DES to RCA and LCX 09/2014, s/p ICD November 2016 (Medtronic Visia AF), PAFlutter, AI, HLD, who presented to [facility] on 09/28/16 for elective hernia repair. Cardiology consulted for perioperative hypotension and transient ECG changes.    Hypotension: required pressors intraoperatively. Now off pressors but BP remains  soft but improving. Review of BP from previous office visits, her BP chronically runs low normal ~ 100s/60s  Elevated troponin: low and flat trend:  0.06--> 0.06--> 0.06. Likely demand in the setting of hypotension. She is not having any chest pain. ECG with TWI similar to previous tracings.   Acute on chronic combined S/D CHF: she is currently intubated and not complaining of SOB. However, her weight is up at least 9 lbs from last office visit 126--> 135 lbs today. She has known ascites. There is some evidence of CHF on CXR. She will likely require IV diuresis. This may be difficult with hypotension  -- would hold BB and ARB given hypotension   Post op anemia: Hg down to 8.6 from 12.2 yesterday. Continue to monitor  Hx of PAF: maintaining NSR here  CAD: continue ASA and statin. BB held given hypotension   AI/MI: moderate by most recent ECHO in 03/2016  Possible PNA: seen on CXR. White count stable. Continue to monitor  Signed: Angelena Form, PA-C 09/29/2016 7:40 AM  Pager [phone] Co-Sign MD  The patient was seen, examined and discussed with Nell Range, PA-C and I agree with the above.   66 y.o. female with a history of severe ischemic cardiomyopathy, chronic systolic and diastolic biventricular HF, CAD s/p NSTEMI and DES to RCA and LCX 09/2014, s/p ICD November 2016 (Medtronic Visia AF), PAFlutter, AI, HLD, who presented to [facility] on 09/28/16 for elective hernia repair. Cardiology consulted for perioperative hypotension and transient ECG changes. The patient received iv fluids during surgery that lead to acute ob chronic combined CHF, acute respiratory failure requiring intubation, she was extubated this am. She is hypotensive and fluid overloaded on physical exam, but saturating well.  I will start lasix 20 mg iv Q6H considering hypotension, start levophed infusion to keep MAP > 65 mmHg and SBP > 90  mmHg. We will replace Mg and K.   Ena Dawley, MD 09/29/2016

## 2016-09-30 ENCOUNTER — Inpatient Hospital Stay (HOSPITAL_COMMUNITY): Payer: Medicare HMO

## 2016-09-30 DIAGNOSIS — G9340 Encephalopathy, unspecified: Secondary | ICD-10-CM

## 2016-09-30 LAB — URINALYSIS, ROUTINE W REFLEX MICROSCOPIC
BILIRUBIN URINE: NEGATIVE
GLUCOSE, UA: NEGATIVE mg/dL
KETONES UR: NEGATIVE mg/dL
NITRITE: NEGATIVE
Protein, ur: NEGATIVE mg/dL
SPECIFIC GRAVITY, URINE: 1.009 (ref 1.005–1.030)
pH: 5 (ref 5.0–8.0)

## 2016-09-30 LAB — PHOSPHORUS: Phosphorus: 3.5 mg/dL (ref 2.5–4.6)

## 2016-09-30 LAB — MAGNESIUM: MAGNESIUM: 2.1 mg/dL (ref 1.7–2.4)

## 2016-09-30 LAB — CBC
HCT: 21.4 % — ABNORMAL LOW (ref 36.0–46.0)
HEMATOCRIT: 27.4 % — AB (ref 36.0–46.0)
Hemoglobin: 7.2 g/dL — ABNORMAL LOW (ref 12.0–15.0)
Hemoglobin: 9.1 g/dL — ABNORMAL LOW (ref 12.0–15.0)
MCH: 27.6 pg (ref 26.0–34.0)
MCH: 27.7 pg (ref 26.0–34.0)
MCHC: 33.6 g/dL (ref 30.0–36.0)
MCHC: 33.2 g/dL (ref 30.0–36.0)
MCV: 82 fL (ref 78.0–100.0)
MCV: 83.5 fL (ref 78.0–100.0)
PLATELETS: 150 10*3/uL (ref 150–400)
PLATELETS: 137 10*3/uL — AB (ref 150–400)
RBC: 2.61 MIL/uL — ABNORMAL LOW (ref 3.87–5.11)
RBC: 3.28 MIL/uL — ABNORMAL LOW (ref 3.87–5.11)
RDW: 16.9 % — ABNORMAL HIGH (ref 11.5–15.5)
RDW: 16.6 % — AB (ref 11.5–15.5)
WBC: 15 10*3/uL — ABNORMAL HIGH (ref 4.0–10.5)
WBC: 17.5 10*3/uL — AB (ref 4.0–10.5)

## 2016-09-30 LAB — PREPARE RBC (CROSSMATCH)

## 2016-09-30 LAB — BASIC METABOLIC PANEL
Anion gap: 5 (ref 5–15)
BUN: 30 mg/dL — AB (ref 6–20)
CALCIUM: 8.2 mg/dL — AB (ref 8.9–10.3)
CO2: 22 mmol/L (ref 22–32)
CREATININE: 1.22 mg/dL — AB (ref 0.44–1.00)
Chloride: 107 mmol/L (ref 101–111)
GFR calc Af Amer: 52 mL/min — ABNORMAL LOW (ref >60)
GFR, EST NON AFRICAN AMERICAN: 45 mL/min — AB (ref >60)
GLUCOSE: 145 mg/dL — AB (ref 65–99)
Potassium: 4.4 mmol/L (ref 3.5–5.1)
SODIUM: 134 mmol/L — AB (ref 135–145)

## 2016-09-30 LAB — GLUCOSE, CAPILLARY: Glucose-Capillary: 154 mg/dL — ABNORMAL HIGH (ref 65–99)

## 2016-09-30 LAB — ABO/RH: ABO/RH(D): AB POS

## 2016-09-30 MED ORDER — PHENAZOPYRIDINE HCL 100 MG PO TABS
100.0000 mg | ORAL_TABLET | Freq: Three times a day (TID) | ORAL | Status: AC
  Administered 2016-09-30 – 2016-10-01 (×2): 100 mg via ORAL
  Filled 2016-09-30 (×4): qty 1

## 2016-09-30 MED ORDER — SODIUM CHLORIDE 0.9 % IV SOLN: Freq: Once | INTRAVENOUS | Status: DC

## 2016-09-30 MED ORDER — FUROSEMIDE 10 MG/ML IJ SOLN: 40.0000 mg | Freq: Two times a day (BID) | INTRAMUSCULAR | Status: DC

## 2016-09-30 MED ORDER — FUROSEMIDE 10 MG/ML IJ SOLN
60.0000 mg | Freq: Two times a day (BID) | INTRAMUSCULAR | Status: AC
  Administered 2016-09-30 – 2016-10-02 (×6): 60 mg via INTRAVENOUS
  Filled 2016-09-30 (×6): qty 6

## 2016-09-30 NOTE — Progress Notes (Signed)
Patient ID: Sheri Dunn, female   DOB: 09-06-1950, 66 y.o.   MRN: [id number]     SUBJECTIVE: Pt presented to [facility] on 09/28/16 for planned laparoscopic bilateral laparoscopic inguinal and obturator hernia repairs with mesh, laparoscopic umbilical hernia with mesh, needle aspiration of seroma, insertion of mesh with Dr. Michaell Cowing. Surgery was successfully completed. She was hypotensive peri-operatively requiring a phenylephrine and norepinephrine. There were some transient TWIs on telemetry perioperatively. Troponin returned at 0.06--> 0.06--> 0.06.   Post op, she was unable to be extubated; therefore, PCCM was called to assist with vent management.  She was extubated yesterday. ECG 4/27 showed sinus bradycardia HR 54 with TWI in inferior leads, similar to previous tracings as outpatient.   Today, patient reports some surgical site pain.  Denies dyspnea.  Norepinephrine was stopped last night, BP 90-100s/40s this morning. Hemoglobin lower at 7.2.   Echo (10/17): EF 20-25%, mildly dilated LV, moderate AI, moderate MR, RV moderately dilated/moderately decreased systolic function.   Scheduled Meds: . aspirin  81 mg Per Tube Daily  . atorvastatin  20 mg Per Tube q1800  . carvedilol  12.5 mg Oral BID WC  . cholecalciferol  2,000 Units Oral Daily  . enoxaparin (LOVENOX) injection  40 mg Subcutaneous Q24H  . furosemide  40 mg Intravenous BID  . lip balm  1 application Topical BID  . mouth rinse  15 mL Mouth Rinse BID  . sodium chloride flush  3 mL Intravenous Q12H   Continuous Infusions: . sodium chloride    . sodium chloride    . albumin human 12.5 g (09/29/16 2106)  . methocarbamol (ROBAXIN)  IV    . norepinephrine (LEVOPHED) Adult infusion Stopped (09/30/16 0332)   PRN Meds:.sodium chloride, acetaminophen, acetaminophen, albumin human, alum & mag hydroxide-simeth, bisacodyl, diphenhydrAMINE **OR** diphenhydrAMINE, enalaprilat, hydrALAZINE, hydrALAZINE, HYDROcodone-acetaminophen, HYDROmorphone  (DILAUDID) injection, magic mouthwash, menthol-cetylpyridinium, methocarbamol (ROBAXIN)  IV, methocarbamol, methocarbamol, ondansetron **OR** ondansetron (ZOFRAN) IV, phenol, polyethylene glycol, prochlorperazine, simethicone, sodium chloride flush   Vitals:   09/30/16 0615 09/30/16 0630 09/30/16 0700 09/30/16 0737  BP:  (!) 86/47 (!) 85/49   Pulse: 72 70 70   Resp: (!) 24 (!) 27 (!) 23   Temp:    97.9 F (36.6 C)  TempSrc:    Oral  SpO2: 100% 95% 95%   Weight:      Height:        Intake/Output Summary (Last 24 hours) at 09/30/16 0820 Last data filed at 09/30/16 0500  Gross per 24 hour  Intake          1329.51 ml  Output              480 ml  Net           849.51 ml    LABS: Basic Metabolic Panel:  Recent Labs  40/98/11 0350 09/30/16 0500  NA 136 134*  K 3.8 4.4  CL 110 107  CO2 20* 22  GLUCOSE 161* 145*  BUN 27* 30*  CREATININE 0.91 1.22*  CALCIUM 8.1* 8.2*  MG 1.8 2.1  PHOS 3.6 3.5   Liver Function Tests:  Recent Labs  09/28/16 1753  AST 20  ALT 11*  ALKPHOS 90  BILITOT 1.5*  PROT 4.6*  ALBUMIN 2.8*   No results for input(s): LIPASE, AMYLASE in the last 72 hours. CBC:  Recent Labs  09/29/16 0350 09/30/16 0500  WBC 8.2 15.0*  HGB 8.6* 7.2*  HCT 26.1* 21.4*  MCV 82.3 82.0  PLT 131* 150  Cardiac Enzymes:  Recent Labs  09/28/16 1233 09/28/16 1753 09/28/16 2240  CKTOTAL 75  --   --   CKMB 2.2  --   --   TROPONINI 0.06* 0.06* 0.06*   BNP: Invalid input(s): POCBNP D-Dimer: No results for input(s): DDIMER in the last 72 hours. Hemoglobin A1C: No results for input(s): HGBA1C in the last 72 hours. Fasting Lipid Panel: No results for input(s): CHOL, HDL, LDLCALC, TRIG, CHOLHDL, LDLDIRECT in the last 72 hours. Thyroid Function Tests: No results for input(s): TSH, T4TOTAL, T3FREE, THYROIDAB in the last 72 hours.  Invalid input(s): FREET3 Anemia Panel: No results for input(s): VITAMINB12, FOLATE, FERRITIN, TIBC, IRON, RETICCTPCT in the  last 72 hours.  RADIOLOGY: Portable Chest Xray  Result Date: 09/29/2016 CLINICAL DATA:  Hypoxia EXAM: PORTABLE CHEST 1 VIEW COMPARISON:  September 28, 2016 FINDINGS: Endotracheal tube tip is 1.0 cm above the carina. Pacemaker lead is attached to the right ventricle. No pneumothorax. There is persistent subcutaneous emphysema. There is cardiomegaly with pulmonary venous hypertension. There is interstitial edema with bilateral pleural effusions. There is consolidation in the left lower lobe. IMPRESSION: Endotracheal tube is near the carina; advise withdrawing approximately 2 cm. No pneumothorax. Note that there is persistent subcutaneous emphysema. Underlying congestive heart failure. Question superimposed pneumonia left lower lobe. Electronically Signed   By: Bretta Bang III M.D.   On: 09/29/2016 08:08   Dg Chest Port 1 View  Result Date: 09/28/2016 CLINICAL DATA:  Initial evaluation for acute respiratory failure with hypoxia. EXAM: PORTABLE CHEST 1 VIEW COMPARISON:  Prior radiograph from 06/17/2015. FINDINGS: Patient is intubated with the tip of an endotracheal tube positioned 3.1 cm above the carina. Left-sided transvenous pacemaker/AICD in place. Moderate cardiomegaly, stable. Mediastinal silhouette normal. Aortic atherosclerosis. Lungs hypoinflated. Perihilar vascular congestion without overt pulmonary edema. Dense opacity within the retrocardiac left lower lobe may reflect atelectasis and/ or consolidation. Suspected left pleural effusion. No pneumothorax. Diffuse soft tissue emphysema seen within the bilateral chest walls and within the lower left neck. No acute osseous abnormality. IMPRESSION: 1. Tip of the endotracheal tube 3.1 cm above the carina. 2. Dense opacity within the retrocardiac left lower lobe, which may reflect atelectasis and/or consolidation. Suspected small left pleural effusion. 3. Cardiomegaly with perihilar vascular congestion without overt pulmonary edema. 4. Scattered soft  tissue emphysema within the chest wall bilaterally and lower left neck. Electronically Signed   By: Rise Mu M.D.   On: 09/28/2016 16:55    PHYSICAL EXAM General: NAD Neck: JVP difficult, appears elevated to 12-14. No thyromegaly or thyroid nodule.  Lungs: Crackles at bases bilaterally. CV: Nondisplaced PMI.  Heart regular S1/S2, no S3/S4.  Somewhat distant heart sounds, do not hear diastolic murmur.  Trace ankle edema.   Abdomen: Soft, nontender, no hepatosplenomegaly, moderate distention.  Neurologic: Alert and oriented x 3.  Psych: Normal affect. Extremities: No clubbing or cyanosis.   TELEMETRY: Reviewed telemetry pt in NSR  ASSESSMENT AND PLAN: 66 yo with history of CAD s/p PCI RCA/LCX in 2016, ischemic cardiomyopathy, chronic systolic CHF had laparoscopic hernia repair and with post-op hypotension and volume overload.  1. Acute on chronic systolic CHF: With peri-operative hypotension.  Suspect hypotension related to sedating medications/anesthesia + baseline poor cardiac function.  Ischemic cardiomyopathy.  She has significant RV failure with ascites, per surgeon 3 L ascites removed during operation.  Last echo in 10/17 with EF 20-25%, mildly dilated LV, moderate AI, moderate MR, RV moderately dilated/moderately decreased systolic function.  On exam, I suspect that  she is volume overloaded.  She is off her BP-active meds with SBP currently 90s-100s.  Wide pulse pressure may reflect aortic insufficiency (moderate on last echo, hard to hear murmur on exam today).  - Lasix 60 mg IV bid today - Hold off on norepinephrine for now.  2. Post-surgical: s/p laparoscopic hernia repair, per [facility].  3. CAD: DES to RCA and LCx in 2016.  TnI to 0.06 with no trend peri-operatively.  Suspect demand ischemia due to hypotension.  No changes on ECG yesterday compared to prior to admit.  Continue ASA 81 and statin.  4. Anemia: Post-op.  To get 1 unit today with Lasix.   Marca Ancona 09/30/2016 8:31 AM

## 2016-09-30 NOTE — Progress Notes (Signed)
Pt has been having frequent urge to urinate. Pt only voiding 25-40 cc at time . Bladder scanned for only 57cc. Will alert md. Tammy Sours

## 2016-09-30 NOTE — Progress Notes (Signed)
PT Cancellation Note  Patient Details Name: Sheri Dunn MRN: [id number] DOB: Jul 29, 1950   Cancelled Treatment:    Reason Eval/Treat Not Completed: Patient declined, no reason specified.  Patient reports she is having to use the bathroom so much today that she doesn't want to get up now.  Wants the bedpan. Will return tomorrow for PT evaluation.   Vena Austria 09/30/2016, 6:50 PM [name]. Renaldo Fiddler, [facility] Acute Rehab Services Pager [phone]

## 2016-09-30 NOTE — Progress Notes (Signed)
Pt c/o arm hurting with levophed going through.  RN assessed and patients arm was swollen with a few red marks.  IV removed, arm elevated, and ice placed on arm.  Elink MD aware that pt has pressor going through her other PIV.  No new orders given at this time.  Pt BP on art line maintaining.  Will try to wean off levo as quickly and safely as possible.

## 2016-09-30 NOTE — Progress Notes (Signed)
Pt has been off of levophed for a few hours, however her other arm where the other access site is, is now hurting her.  It is very painful to try and flush. RN attempted 2 times and charge nurse attempted as well to gain another access site.  IV team consult has been placed.

## 2016-09-30 NOTE — Progress Notes (Signed)
Pt has been voiding small amounts all day. Bladder scan was vague with result of >200cc. Called MD. Pt still having constant urge to void, Foley reinserted and had 450cc clear yellow urine immediately return. Pt tolerated well and voices relief. Tammy Sours

## 2016-09-30 NOTE — Progress Notes (Signed)
[facility] Attending Note  ADMISSION DATE:  09/28/2016  CONSULTATION DATE:  09/28/16  REFERRING MD:  Johney Maine  CHIEF COMPLAINT:  Vent management post op  Presenting HPI:  66 y.o. female admitted with encephalopathy. Multiple medical problems including systolic congestive heart failure with EF 20% & grade 2 diastolic congestive heart failure with ischemic cardiomyopathy and hypertension. Patient with known human all and umbilical hernias. Followed by Dr. Windle Guard at [facility] surgery. Admitted for/26 for elective surgical repair and underwent bilateral laparoscopic inguinal and obturator hernia repairs with mesh. Patient also had laparoscopic umbilical hernia repair with mesh, needle aspiration of seroma, and insertion of mesh performed by Dr. Johney Maine. Patient was unable to be extubated postoperatively and critical care was consulted for vent management. Patient is followed by [facility] for her combined heart failure.   Subjective:  Patient extubated yesterday. Placed on BiPAP overnight due to shallow breathing. Patient's saturation overnight was 97% on 2 L/m nasal cannula. Weaned off Levophed overnight. Patient denies any abdominal pain but has not yet had a bowel movement or flatulence. Denies any dyspnea or cough. Patient's peripherally infusing vasopressor infiltrated overnight. She is complaining of left hand pain with some edema.  Review of Systems:  No chest pain or pressure. No subjective fever or chills.  Vent Mode: CPAP;PSV FiO2 (%):  [40 %] 40 % PEEP:  [5 cmH20] 5 cmH20 Pressure Support:  [10 cmH20] 10 cmH20  Temp:  [96.9 F (36.1 C)-98.6 F (37 C)] 98.2 F (36.8 C) (04/28 0315) Pulse Rate:  [55-82] 70 (04/28 0700) Resp:  [13-39] 23 (04/28 0700) BP: (65-99)/(36-64) 85/49 (04/28 0700) SpO2:  [93 %-100 %] 95 % (04/28 0700) Arterial Line BP: (63-110)/(28-51) 88/34 (04/28 0700) FiO2 (%):  [40 %] 40 % (04/27 0739)  General:  No family at bedside. Awake. Up  to bedside chair. Integument:  Warm & dry. No rash on exposed skin. No cyanosis in left digits or hand. HEENT:  Moist mucus memebranes. No scleral icterus.  Neurological:  Pupils symmetric. Grossly nonfocal. Cranial nerves intact. Oriented 4. Musculoskeletal:  Mild swelling in the dorsum of left hand. No joint effusion appreciated. Pulmonary:  Normal work of breathing on room air. Slightly shallow respirations. Clear to auscultation with what I can examine with abdominal binder in place. Cardiovascular:  Regular rate. No appreciable JVD.  Abdomen: Abdominal binder in place.  LINES/TUBES: OETT 4/26 - 4/27 R Radial Art Line PIV  CBC Latest Ref Rng & Units 09/30/2016 09/29/2016 09/28/2016  WBC 4.0 - 10.5 K/uL 15.0(H) 8.2 9.0  Hemoglobin 12.0 - 15.0 g/dL 7.2(L) 8.6(L) 8.9(L)  Hematocrit 36.0 - 46.0 % 21.4(L) 26.1(L) 26.9(L)  Platelets 150 - 400 K/uL 150 131(L) 135(L)   BMP Latest Ref Rng & Units 09/30/2016 09/29/2016 09/28/2016  Glucose 65 - 99 mg/dL 145(H) 161(H) 90  BUN 6 - 20 mg/dL 30(H) 27(H) 29(H)  Creatinine 0.44 - 1.00 mg/dL 1.22(H) 0.91 1.04(H)  Sodium 135 - 145 mmol/L 134(L) 136 136  Potassium 3.5 - 5.1 mmol/L 4.4 3.8 4.2  Chloride 101 - 111 mmol/L 107 110 109  CO2 22 - 32 mmol/L 22 20(L) 20(L)  Calcium 8.9 - 10.3 mg/dL 8.2(L) 8.1(L) 8.3(L)   Hepatic Function Latest Ref Rng & Units 09/28/2016 09/19/2016 03/10/2016  Total Protein 6.5 - 8.1 g/dL 4.6(L) 7.1 7.3  Albumin 3.5 - 5.0 g/dL 2.8(L) 3.7 3.8  AST 15 - 41 U/L _0 ALT 14 - 54 U/L 11(L) 8 16  Alk Phosphatase 38 -  126 U/L 90 197(H) 175(H)  Total Bilirubin 0.3 - 1.2 mg/dL 1.5(H) 1.1 1.2  Bilirubin, Direct 0.0 - 0.3 mg/dL - 0.4(H) 0.5(H)    IMAGING/STUDIES: PORT CXR 4/27:  Personally reviewed by me. Endotracheal tube in place. Persistent silhouetting of left hemidiaphragm that appears chronic. Implanted device in left chest noted. Patchy bilateral alveolar opacities with right hilar fullness slightly decreased compared with  4/26 chest x-ray. Subcutaneous air present.  MICROBIOLOGY: MRSA PCR 4/26:  Negative   ANTIBIOTICS: Ancef 4/26 (periop prophylaxis)  SIGNIFICANT EVENTS: 04/26 - Admit for elective laparoscopic hernia repair by Dr. Johney Maine  ASSESSMENT/PLAN:  66 y.o.  female with combined diastolic and systolic congestive heart failure admitted for elective laparoscopic hernia repair. Patient has weaned off vasopressor support. Respiratory status is progressively improving and she is currently on room air. Cardiology is following for her combined heart failure. She is planned for transfusion this morning for her anemia.  1. Acute hypoxic respiratory failure: Resolved. Continuing incentive spirometry postextubation for pulmonary toilette and to minimize atelectasis. Recommend out of bed to chair with ambulation as much as possible. No indication for continue noninvasive positive pressure ventilation at this time. 2. Shock: Resolved. Multifactorial from patient's combined heart failure as well as sedatives. 3. Leukocytosis: Likely multifactorial. Afebrile. Would continue to monitor for signs or symptoms of infection and perform cultures before starting empiric antibiotics. 4. Combined systolic and diastolic congestive heart failure: Continuous telemetry monitoring. Management per cardiology. 5. Status post laparoscopic abdominal hernia repair: Management per surgery service. 6. Anemia: No signs of active bleeding. ` Blood cell transfusion ordered. 7. Acute encephalopathy: Resolved. Likely multifactorial.  Prophylaxis:  Lovenox [place] daily & SCDs. Diet:  Heart Healthy. Code Status:  Full Code per previous physician discussions. Disposition:  As per primary service. Family Update: patient updated at bedside during rounds.  I have spent a total of 36 minutes of time today caring for the patient, reviewing the patient's electronic medical record, and with more than 50% of that time spent coordinating care with the  patient as well as reviewing the continuing plan of care with the patient and nurse  at bedside.  Remainder of care as per primary service and consulting physicians.  PCCM will sign off at this time. Please let me know if we can be of any further assistance in the care of this patient.  Sonia Baller Ashok Cordia, M.D. [facility] Pager:  [phone] After 3pm or if no response, call [phone] 7:33 AM 09/30/16

## 2016-09-30 NOTE — Progress Notes (Addendum)
[facility]  [address] [place], [place] [address] Phone: [phone] FAX: [phone]   Sheri Dunn [id number] 1951-03-30    Problem List:   Principal Problem:   Ischemic cardiomyopathy Active Problems:   Essential hypertension   CAD S/P PCI April 2016   ICD (implantable cardioverter-defibrillator) in place   Bilateral inguinal hernias s/p lap repair with mesh 09/28/2016   Obturator hernias, bilateral, s/p lap repair with mesh 4/82/5003   Umbilical hernia s/p lap repair with mesh 09/28/2016   Inguinal hernia   2 Days Post-Op  09/28/2016  POST-OPERATIVE DIAGNOSIS:    Bilateral inguinal hernias Bilateral obturator hernias Umbilical hernia LLQ seromas  PROCEDURE:    LAPAROSCOPIC BILATERAL INGUINAL HERNIA REPAIRS WITH MESH LAPAROSCOPIC BILATERAL OBTURATOR HERNIA REPAIRS WITH MESH LAPAROSCOPIC UMBILICAL HERNIA REPAIR WITH MESH Needle aspiration of seroma INSERTION OF MESH  SURGEON:  Adin Hector, MD    Assessment  Guarded  Plan:  -transfuse 1U PRBCs for acute postop on chronic anemia -PRN boluses - switch to colloid with CHF & ascites to avoid fluid overload.  Cardiology & CCM may adjust -weaning FiO2 - hopefully extubate this AM.  Had d/w Dr Titus Mould yesterday. -BP per cardiology.  Keep Coreg especially per Dr Lurline Del outpt notes, but may need to hold the rest as needed.  D/w Dr Marigene Ehlers with Cards/Heart failure -resume diuretics when safe - defer to cardiology -troponins low - doubt MI.  Defer to cardiology  -keep on liquids for now.  Adv to solids when BP more stable & having BMs -VTE prophylaxis- SCDs, etc -mobilize as tolerated to help recovery  Adin Hector, M.D., F.A.C.S. Gastrointestinal and Minimally Invasive Surgery [facility] [address] [place], [place] [address] [phone] Main / Paging   09/30/2016  CARE TEAM:  PCP: Kandice Hams,  MD  Outpatient Care Team: Patient Care Team: [name], MD as PCP - General (Internal Medicine) Sanda Klein, MD as Consulting Physician (Cardiology) Michael Boston, MD as Consulting Physician (General Surgery)  Inpatient Treatment Team: Treatment Team: Attending Provider: Michael Boston, MD; Consulting Physician: Sanda Klein, MD; Consulting Physician: Michae Kava Lbcardiology, MD; Consulting Physician: Md Pccm, MD; Consulting Physician: Raylene Miyamoto, MD; Rounding Team: Md Pccm, MD; Physical Therapist: Despina Pole, PT  Subjective:  Extubated Diet adv to solids (?) Feels full Hypoxia last night - better now Denies much pain RN at bedside   Objective:  Vital signs:  Vitals:   09/30/16 0500 09/30/16 0515 09/30/16 0615 09/30/16 0630  BP: (!) 78/44 (!) 81/46  (!) 86/47  Pulse: 70 72 72 70  Resp: (!) 25 (!) 32 (!) 24 (!) 27  Temp:      TempSrc:      SpO2: 94% 93% 100% 95%  Weight:      Height:        Last BM Date:  (PTA)  Intake/Output   Yesterday:  04/27 0701 - 04/28 0700 In: 1579.5 [P.O.:480; I.V.:599.5; IV Piggyback:500] Out: 480 [Urine:480] This shift:  Total I/O In: 562.8 [I.V.:312.8; IV Piggyback:250] Out: 130 [Urine:130]  Bowel function:  Flatus: No  BM:  No  Drain: (No drain)   Physical Exam:  General: Pt awake/alert/oriented x4 inno acute distress Eyes: PERRL, normal EOM.  Sclera clear.  No icterus Neuro: CN II-XII intact w/o focal sensory/motor deficits. Lymph: No head/neck/groin lymphadenopathy Psych:  No delerium/psychosis/paranoia HENT: Normocephalic, Mucus membranes moist.  No thrush.  ETT in place Neck: Supple, No tracheal deviation  Chest: No chest wall pain w good excursion CV:  Pulses intact.  Regular rhythm MS: Normal AROM mjr joints.  No obvious deformity Abdomen: Somewhat firm.  Moderately distended.  Nontender.  Incisions c/d/I.  DRY.  Old hernia sacs reducible in both groins.  No evidence of peritonitis.  No incarcerated  hernias. Ext:  SCDs BLE.  No mjr edema.  No cyanosis Skin: No petechiae / purpura  Results:   Labs: Results for orders placed or performed during the hospital encounter of 09/28/16 (from the past 48 hour(s))  CK total and CKMB (cardiac)not at [facility]     Status: None   Collection Time: 09/28/16 12:33 PM  Result Value Ref Range   Total CK 75 38 - 234 U/L   CK, MB 2.2 0.5 - 5.0 ng/mL   Relative Index RELATIVE INDEX IS INVALID 0.0 - 2.5    Comment: WHEN CK < 100 U/L          Troponin I (q 6hr x 3)     Status: Abnormal   Collection Time: 09/28/16 12:33 PM  Result Value Ref Range   Troponin I 0.06 (HH) <0.03 ng/mL    Comment: CRITICAL RESULT CALLED TO, READ BACK BY AND VERIFIED WITHAvis Epley [id number] WILDERK   MRSA PCR Screening     Status: None   Collection Time: 09/28/16  5:01 PM  Result Value Ref Range   MRSA by PCR NEGATIVE NEGATIVE    Comment:        The GeneXpert MRSA Assay (FDA approved for NASAL specimens only), is one component of a comprehensive MRSA colonization surveillance program. It is not intended to diagnose MRSA infection nor to guide or monitor treatment for MRSA infections.   Troponin I (q 6hr x 3)     Status: Abnormal   Collection Time: 09/28/16  5:53 PM  Result Value Ref Range   Troponin I 0.06 (HH) <0.03 ng/mL    Comment: CRITICAL VALUE NOTED.  VALUE IS CONSISTENT WITH PREVIOUSLY REPORTED AND CALLED VALUE.  Comprehensive metabolic panel     Status: Abnormal   Collection Time: 09/28/16  5:53 PM  Result Value Ref Range   Sodium 136 135 - 145 mmol/L   Potassium 4.2 3.5 - 5.1 mmol/L   Chloride 109 101 - 111 mmol/L   CO2 20 (L) 22 - 32 mmol/L   Glucose, Bld 90 65 - 99 mg/dL   BUN 29 (H) 6 - 20 mg/dL   Creatinine, Ser 1.04 (H) 0.44 - 1.00 mg/dL   Calcium 8.3 (L) 8.9 - 10.3 mg/dL   Total Protein 4.6 (L) 6.5 - 8.1 g/dL   Albumin 2.8 (L) 3.5 - 5.0 g/dL   AST 20 15 - 41 U/L   ALT 11 (L) 14 - 54 U/L   Alkaline Phosphatase 90 38 - 126 U/L   Total  Bilirubin 1.5 (H) 0.3 - 1.2 mg/dL   GFR calc non Af Amer 54 (L) >60 mL/min   GFR calc Af Amer >60 >60 mL/min    Comment: (NOTE) The eGFR has been calculated using the CKD EPI equation. This calculation has not been validated in all clinical situations. eGFR's persistently <60 mL/min signify possible Chronic Kidney Disease.    Anion gap 7 5 - 15  CBC     Status: Abnormal   Collection Time: 09/28/16  5:53 PM  Result Value Ref Range   WBC 9.0 4.0 - 10.5 K/uL   RBC 3.25 (L) 3.87 - 5.11 MIL/uL  Hemoglobin 8.9 (L) 12.0 - 15.0 g/dL   HCT 26.9 (L) 36.0 - 46.0 %   MCV 82.8 78.0 - 100.0 fL   MCH 27.4 26.0 - 34.0 pg   MCHC 33.1 30.0 - 36.0 g/dL   RDW 16.6 (H) 11.5 - 15.5 %   Platelets 135 (L) 150 - 400 K/uL  Magnesium     Status: None   Collection Time: 09/28/16  5:53 PM  Result Value Ref Range   Magnesium 1.7 1.7 - 2.4 mg/dL  Phosphorus     Status: None   Collection Time: 09/28/16  5:53 PM  Result Value Ref Range   Phosphorus 3.8 2.5 - 4.6 mg/dL  Glucose, capillary     Status: None   Collection Time: 09/28/16  8:09 PM  Result Value Ref Range   Glucose-Capillary 74 65 - 99 mg/dL   Comment 1 Capillary Specimen   Glucose, capillary     Status: None   Collection Time: 09/28/16  9:39 PM  Result Value Ref Range   Glucose-Capillary 77 65 - 99 mg/dL   Comment 1 Capillary Specimen   Troponin I (q 6hr x 3)     Status: Abnormal   Collection Time: 09/28/16 10:40 PM  Result Value Ref Range   Troponin I 0.06 (HH) <0.03 ng/mL    Comment: CRITICAL VALUE NOTED.  VALUE IS CONSISTENT WITH PREVIOUSLY REPORTED AND CALLED VALUE.  Glucose, capillary     Status: Abnormal   Collection Time: 09/29/16  3:15 AM  Result Value Ref Range   Glucose-Capillary 49 (L) 65 - 99 mg/dL   Comment 1 Capillary Specimen   Glucose, capillary     Status: None   Collection Time: 09/29/16  3:33 AM  Result Value Ref Range   Glucose-Capillary 82 65 - 99 mg/dL   Comment 1 Capillary Specimen   Magnesium     Status: None    Collection Time: 09/29/16  3:50 AM  Result Value Ref Range   Magnesium 1.8 1.7 - 2.4 mg/dL  Phosphorus     Status: None   Collection Time: 09/29/16  3:50 AM  Result Value Ref Range   Phosphorus 3.6 2.5 - 4.6 mg/dL  Basic metabolic panel     Status: Abnormal   Collection Time: 09/29/16  3:50 AM  Result Value Ref Range   Sodium 136 135 - 145 mmol/L   Potassium 3.8 3.5 - 5.1 mmol/L   Chloride 110 101 - 111 mmol/L   CO2 20 (L) 22 - 32 mmol/L   Glucose, Bld 161 (H) 65 - 99 mg/dL   BUN 27 (H) 6 - 20 mg/dL   Creatinine, Ser 0.91 0.44 - 1.00 mg/dL   Calcium 8.1 (L) 8.9 - 10.3 mg/dL   GFR calc non Af Amer >60 >60 mL/min   GFR calc Af Amer >60 >60 mL/min    Comment: (NOTE) The eGFR has been calculated using the CKD EPI equation. This calculation has not been validated in all clinical situations. eGFR's persistently <60 mL/min signify possible Chronic Kidney Disease.    Anion gap 6 5 - 15  CBC     Status: Abnormal   Collection Time: 09/29/16  3:50 AM  Result Value Ref Range   WBC 8.2 4.0 - 10.5 K/uL   RBC 3.17 (L) 3.87 - 5.11 MIL/uL   Hemoglobin 8.6 (L) 12.0 - 15.0 g/dL   HCT 26.1 (L) 36.0 - 46.0 %   MCV 82.3 78.0 - 100.0 fL   MCH 27.1 26.0 - 34.0  pg   MCHC 33.0 30.0 - 36.0 g/dL   RDW 16.6 (H) 11.5 - 15.5 %   Platelets 131 (L) 150 - 400 K/uL  Glucose, capillary     Status: Abnormal   Collection Time: 09/29/16  4:02 AM  Result Value Ref Range   Glucose-Capillary 101 (H) 65 - 99 mg/dL   Comment 1 Capillary Specimen   Glucose, capillary     Status: None   Collection Time: 09/29/16  5:14 AM  Result Value Ref Range   Glucose-Capillary 82 65 - 99 mg/dL   Comment 1 Capillary Specimen   Glucose, capillary     Status: Abnormal   Collection Time: 09/29/16  7:06 AM  Result Value Ref Range   Glucose-Capillary 59 (L) 65 - 99 mg/dL  Glucose, capillary     Status: Abnormal   Collection Time: 09/29/16  7:23 AM  Result Value Ref Range   Glucose-Capillary 104 (H) 65 - 99 mg/dL    Comment 1 Capillary Specimen   Glucose, capillary     Status: Abnormal   Collection Time: 09/29/16  9:50 AM  Result Value Ref Range   Glucose-Capillary 114 (H) 65 - 99 mg/dL   Comment 1 Notify RN   Glucose, capillary     Status: Abnormal   Collection Time: 09/29/16 11:44 AM  Result Value Ref Range   Glucose-Capillary 118 (H) 65 - 99 mg/dL   Comment 1 Notify RN   Glucose, capillary     Status: Abnormal   Collection Time: 09/29/16  3:43 PM  Result Value Ref Range   Glucose-Capillary 126 (H) 65 - 99 mg/dL   Comment 1 Notify RN   Glucose, capillary     Status: Abnormal   Collection Time: 09/29/16  7:31 PM  Result Value Ref Range   Glucose-Capillary 149 (H) 65 - 99 mg/dL  Glucose, capillary     Status: Abnormal   Collection Time: 09/29/16 10:05 PM  Result Value Ref Range   Glucose-Capillary 146 (H) 65 - 99 mg/dL   Comment 1 Notify RN   Glucose, capillary     Status: Abnormal   Collection Time: 09/29/16 11:26 PM  Result Value Ref Range   Glucose-Capillary 161 (H) 65 - 99 mg/dL   Comment 1 Capillary Specimen    Comment 2 Notify RN   Glucose, capillary     Status: Abnormal   Collection Time: 09/30/16  3:12 AM  Result Value Ref Range   Glucose-Capillary 154 (H) 65 - 99 mg/dL   Comment 1 Capillary Specimen    Comment 2 Notify RN   CBC     Status: Abnormal   Collection Time: 09/30/16  5:00 AM  Result Value Ref Range   WBC 15.0 (H) 4.0 - 10.5 K/uL   RBC 2.61 (L) 3.87 - 5.11 MIL/uL   Hemoglobin 7.2 (L) 12.0 - 15.0 g/dL   HCT 21.4 (L) 36.0 - 46.0 %   MCV 82.0 78.0 - 100.0 fL   MCH 27.6 26.0 - 34.0 pg   MCHC 33.6 30.0 - 36.0 g/dL   RDW 16.9 (H) 11.5 - 15.5 %   Platelets 150 150 - 400 K/uL  Basic metabolic panel     Status: Abnormal   Collection Time: 09/30/16  5:00 AM  Result Value Ref Range   Sodium 134 (L) 135 - 145 mmol/L   Potassium 4.4 3.5 - 5.1 mmol/L   Chloride 107 101 - 111 mmol/L   CO2 22 22 - 32 mmol/L   Glucose, Bld  145 (H) 65 - 99 mg/dL   BUN 30 (H) 6 - 20 mg/dL    Creatinine, Ser 1.22 (H) 0.44 - 1.00 mg/dL   Calcium 8.2 (L) 8.9 - 10.3 mg/dL   GFR calc non Af Amer 45 (L) >60 mL/min   GFR calc Af Amer 52 (L) >60 mL/min    Comment: (NOTE) The eGFR has been calculated using the CKD EPI equation. This calculation has not been validated in all clinical situations. eGFR's persistently <60 mL/min signify possible Chronic Kidney Disease.    Anion gap 5 5 - 15  Magnesium     Status: None   Collection Time: 09/30/16  5:00 AM  Result Value Ref Range   Magnesium 2.1 1.7 - 2.4 mg/dL  Phosphorus     Status: None   Collection Time: 09/30/16  5:00 AM  Result Value Ref Range   Phosphorus 3.5 2.5 - 4.6 mg/dL    Imaging / Studies: Portable Chest Xray  Result Date: 09/29/2016 CLINICAL DATA:  Hypoxia EXAM: PORTABLE CHEST 1 VIEW COMPARISON:  September 28, 2016 FINDINGS: Endotracheal tube tip is 1.0 cm above the carina. Pacemaker lead is attached to the right ventricle. No pneumothorax. There is persistent subcutaneous emphysema. There is cardiomegaly with pulmonary venous hypertension. There is interstitial edema with bilateral pleural effusions. There is consolidation in the left lower lobe. IMPRESSION: Endotracheal tube is near the carina; advise withdrawing approximately 2 cm. No pneumothorax. Note that there is persistent subcutaneous emphysema. Underlying congestive heart failure. Question superimposed pneumonia left lower lobe. Electronically Signed   By: Lowella Grip III M.D.   On: 09/29/2016 08:08   Dg Chest Port 1 View  Result Date: 09/28/2016 CLINICAL DATA:  Initial evaluation for acute respiratory failure with hypoxia. EXAM: PORTABLE CHEST 1 VIEW COMPARISON:  Prior radiograph from 06/17/2015. FINDINGS: Patient is intubated with the tip of an endotracheal tube positioned 3.1 cm above the carina. Left-sided transvenous pacemaker/AICD in place. Moderate cardiomegaly, stable. Mediastinal silhouette normal. Aortic atherosclerosis. Lungs hypoinflated. Perihilar  vascular congestion without overt pulmonary edema. Dense opacity within the retrocardiac left lower lobe may reflect atelectasis and/ or consolidation. Suspected left pleural effusion. No pneumothorax. Diffuse soft tissue emphysema seen within the bilateral chest walls and within the lower left neck. No acute osseous abnormality. IMPRESSION: 1. Tip of the endotracheal tube 3.1 cm above the carina. 2. Dense opacity within the retrocardiac left lower lobe, which may reflect atelectasis and/or consolidation. Suspected small left pleural effusion. 3. Cardiomegaly with perihilar vascular congestion without overt pulmonary edema. 4. Scattered soft tissue emphysema within the chest wall bilaterally and lower left neck. Electronically Signed   By: Jeannine Boga M.D.   On: 09/28/2016 16:55    Medications / Allergies: per chart  Antibiotics: Anti-infectives    Start     Dose/Rate Route Frequency Ordered Stop   09/28/16 0616  ceFAZolin (ANCEF) 2-4 GM/100ML-% IVPB    Comments:  Precious Haws   : cabinet override      09/28/16 0616 09/28/16 0756   09/28/16 0612  ceFAZolin (ANCEF) IVPB 2g/100 mL premix     2 g 200 mL/hr over 30 Minutes Intravenous On call to O.R. 09/28/16 8016 09/28/16 0756        Note: Portions of this report may have been transcribed using voice recognition software. Every effort was made to ensure accuracy; however, inadvertent computerized transcription errors may be present.   Any transcriptional errors that result from this process are unintentional.     Remo Lipps C.  Johney Maine, M.D., F.A.C.S. Gastrointestinal and Minimally Invasive Surgery [facility] [address], [place], [place] [address] [phone] Main / Paging   09/30/2016

## 2016-10-01 DIAGNOSIS — K5909 Other constipation: Secondary | ICD-10-CM

## 2016-10-01 LAB — RENAL FUNCTION PANEL
ALBUMIN: 2.5 g/dL — AB (ref 3.5–5.0)
ANION GAP: 7 (ref 5–15)
BUN: 31 mg/dL — ABNORMAL HIGH (ref 6–20)
CALCIUM: 8.3 mg/dL — AB (ref 8.9–10.3)
CO2: 24 mmol/L (ref 22–32)
Chloride: 105 mmol/L (ref 101–111)
Creatinine, Ser: 1.15 mg/dL — ABNORMAL HIGH (ref 0.44–1.00)
GFR calc non Af Amer: 48 mL/min — ABNORMAL LOW (ref >60)
GFR, EST AFRICAN AMERICAN: 56 mL/min — AB (ref >60)
GLUCOSE: 125 mg/dL — AB (ref 65–99)
PHOSPHORUS: 2.5 mg/dL (ref 2.5–4.6)
Potassium: 3.9 mmol/L (ref 3.5–5.1)
Sodium: 136 mmol/L (ref 135–145)

## 2016-10-01 LAB — TYPE AND SCREEN
ABO/RH(D): AB POS
Antibody Screen: NEGATIVE
Unit division: 0

## 2016-10-01 LAB — CBC WITH DIFFERENTIAL/PLATELET
BASOS ABS: 0 10*3/uL (ref 0.0–0.1)
BASOS PCT: 0 %
EOS ABS: 0 10*3/uL (ref 0.0–0.7)
Eosinophils Relative: 0 %
HEMATOCRIT: 25.6 % — AB (ref 36.0–46.0)
HEMOGLOBIN: 8.6 g/dL — AB (ref 12.0–15.0)
Lymphocytes Relative: 5 %
Lymphs Abs: 0.6 10*3/uL — ABNORMAL LOW (ref 0.7–4.0)
MCH: 27.6 pg (ref 26.0–34.0)
MCHC: 33.6 g/dL (ref 30.0–36.0)
MCV: 82.1 fL (ref 78.0–100.0)
MONOS PCT: 10 %
Monocytes Absolute: 1.3 10*3/uL — ABNORMAL HIGH (ref 0.1–1.0)
NEUTROS ABS: 11.2 10*3/uL — AB (ref 1.7–7.7)
NEUTROS PCT: 85 %
Platelets: 144 10*3/uL — ABNORMAL LOW (ref 150–400)
RBC: 3.12 MIL/uL — AB (ref 3.87–5.11)
RDW: 16.4 % — ABNORMAL HIGH (ref 11.5–15.5)
WBC: 13.1 10*3/uL — AB (ref 4.0–10.5)

## 2016-10-01 LAB — URINE CULTURE

## 2016-10-01 LAB — BPAM RBC
BLOOD PRODUCT EXPIRATION DATE: 201805052359
ISSUE DATE / TIME: 201804280937
UNIT TYPE AND RH: 1700

## 2016-10-01 LAB — BASIC METABOLIC PANEL
ANION GAP: 7 (ref 5–15)
BUN: 30 mg/dL — ABNORMAL HIGH (ref 6–20)
CHLORIDE: 104 mmol/L (ref 101–111)
CO2: 23 mmol/L (ref 22–32)
CREATININE: 1.09 mg/dL — AB (ref 0.44–1.00)
Calcium: 8.2 mg/dL — ABNORMAL LOW (ref 8.9–10.3)
GFR calc Af Amer: 59 mL/min — ABNORMAL LOW (ref >60)
GFR calc non Af Amer: 51 mL/min — ABNORMAL LOW (ref >60)
Glucose, Bld: 123 mg/dL — ABNORMAL HIGH (ref 65–99)
Potassium: 4.2 mmol/L (ref 3.5–5.1)
SODIUM: 134 mmol/L — AB (ref 135–145)

## 2016-10-01 LAB — MAGNESIUM: MAGNESIUM: 2 mg/dL (ref 1.7–2.4)

## 2016-10-01 MED ORDER — OXYCODONE HCL 5 MG PO TABS: 5.0000 mg | ORAL_TABLET | ORAL | Status: DC | PRN

## 2016-10-01 MED ORDER — SPIRONOLACTONE 25 MG PO TABS
12.5000 mg | ORAL_TABLET | Freq: Every day | ORAL | Status: DC
  Administered 2016-10-01 – 2016-10-08 (×8): 12.5 mg via ORAL
  Filled 2016-10-01 (×8): qty 1

## 2016-10-01 MED ORDER — ACETAMINOPHEN 500 MG PO TABS
1000.0000 mg | ORAL_TABLET | Freq: Three times a day (TID) | ORAL | Status: DC
  Administered 2016-10-01 (×3): 1000 mg via ORAL
  Filled 2016-10-01 (×3): qty 2

## 2016-10-01 MED ORDER — ALBUMIN HUMAN 5 % IV SOLN
12.5000 g | Freq: Three times a day (TID) | INTRAVENOUS | Status: AC | PRN
  Administered 2016-10-01: 12.5 g via INTRAVENOUS
  Filled 2016-10-01: qty 250

## 2016-10-01 MED ORDER — PSYLLIUM 95 % PO PACK
1.0000 | PACK | Freq: Two times a day (BID) | ORAL | Status: DC
  Administered 2016-10-01 – 2016-10-02 (×3): 1 via ORAL
  Filled 2016-10-01 (×5): qty 1

## 2016-10-01 NOTE — Progress Notes (Signed)
Morning dose of coreg adjusted per parameters. Pt received half of ordered dose = 6.25 mg due to SBP being less than 95.

## 2016-10-01 NOTE — Plan of Care (Signed)
Problem: Activity: Goal: Ability to return to normal activity level will improve Outcome: Progressing Patient up to chair. Working with PT and ambulated around the unit with walker.   Problem: Bowel/Gastric: Goal: Gastrointestinal status for postoperative course will improve Outcome: Progressing Pt passing flatulence but has not had a bowel movement. Pt taking stool softners. MD aware and metamucil added today.  Problem: Respiratory: Goal: Ability to achieve and maintain a regular respiratory rate will improve Outcome: Progressing Patient on RA, working on IS and achieving set goals.  Problem: Pain Management: Goal: General experience of comfort will improve Outcome: Progressing Patient with minor abd discomfort post surgical intervention. Not requiring narcotics.

## 2016-10-01 NOTE — Progress Notes (Signed)
[facility]  [address] [place], [place] [address] Phone: [phone] FAX: [phone]   Sheri Dunn [id number] [birth year]    Problem List:   Principal Problem:   Ischemic cardiomyopathy Active Problems:   Essential hypertension   CAD S/P PCI April 2016   ICD (implantable cardioverter-defibrillator) in place   Bilateral inguinal hernias s/p lap repair with mesh 09/28/2016   Obturator hernias, bilateral, s/p lap repair with mesh 12/15/4578   Umbilical hernia s/p lap repair with mesh 09/28/2016   Inguinal hernia   3 Days Post-Op  09/28/2016  POST-OPERATIVE DIAGNOSIS:    Bilateral inguinal hernias Bilateral obturator hernias Umbilical hernia LLQ seromas  PROCEDURE:    LAPAROSCOPIC BILATERAL INGUINAL HERNIA REPAIRS WITH MESH LAPAROSCOPIC BILATERAL OBTURATOR HERNIA REPAIRS WITH MESH LAPAROSCOPIC UMBILICAL HERNIA REPAIR WITH MESH Needle aspiration of seroma INSERTION OF MESH  SURGEON:  Adin Hector, MD    Assessment  Improving  Plan:  -transfer to telemetry -Foley x3d for poss urinary retention -adv solid diet -postop anemia stabilizing - follow.  Iron PO when having BMs -bowel regimen for constipation -BP per cardiology.  Probably gradually inc diuretics  D/w Dr Marigene Ehlers with Cards/Heart failure -troponins low - doubt MI.  Defer to cardiology  -VTE prophylaxis- SCDs, etc -mobilize as tolerated to help recovery  Adin Hector, M.D., F.A.C.S. Gastrointestinal and Minimally Invasive Surgery [facility] [address] [place], [place] [address] [phone] Main / Paging   10/01/2016  CARE TEAM:  PCP: Kandice Hams, MD  Outpatient Care Team: Patient Care Team: [name], MD as PCP - General (Internal Medicine) Sanda Klein, MD as Consulting Physician (Cardiology) Michael Boston, MD as Consulting Physician (General Surgery)  Inpatient Treatment Team:  Treatment Team: Attending Provider: Michael Boston, MD; Consulting Physician: Sanda Klein, MD; Consulting Physician: Michae Kava Lbcardiology, MD; Consulting Physician: Raylene Miyamoto, MD; Technician: Tera Mater, NT; Registered Nurse: Levon Hedger, RN; Technician: Raynelle Highland, NT; Physical Therapist: Berline Lopes, PT; Registered Nurse: Radonna Ricker, RN  Subjective:  Diff urinating - foley replaced Sore but pain controlled Feels much better overall ICU RNs in room No BM in a week (?!)   Objective:  Vital signs:  Vitals:   10/01/16 0420 10/01/16 0500 10/01/16 0600 10/01/16 0700  BP: (!) 92/43 (!) 90/46 (!) 96/42 (!) 87/42  Pulse: 76 72 68 71  Resp: (!) 33 (!) 36 (!) 31 (!) 31  Temp: 98.8 F (37.1 C)     TempSrc: Oral     SpO2: 95% 93% 94% 91%  Weight: 60.3 kg (133 lb)     Height:        Last BM Date:  (PTA)  Intake/Output   Yesterday:  04/28 0701 - 04/29 0700 In: 3790 [P.O.:1080; Blood:335] Out: 3200 [Urine:3200] This shift:  No intake/output data recorded.  Bowel function:  Flatus: YES  BM:  No  Drain: (No drain)   Physical Exam:  General: Pt awake/alert/oriented x4 in no acute distress Eyes: PERRL, normal EOM.  Sclera clear.  No icterus Neuro: CN II-XII intact w/o focal sensory/motor deficits. Lymph: No head/neck/groin lymphadenopathy Psych:  No delerium/psychosis/paranoia HENT: Normocephalic, Mucus membranes moist.  No thrush.  ETT in place Neck: Supple, No tracheal deviation Chest: No chest wall pain w good excursion CV:  Pulses intact.  Regular rhythm MS: Normal AROM mjr joints.  No obvious deformity Abdomen: Soft.  Moderately distended.  Mildly tender at incisions only.  Incisions  c/d/I.  DRY.  Old hernia sacs reducible in both groins.  No evidence of peritonitis.  No incarcerated hernias. Ext:  SCDs BLE.  No mjr edema.  No cyanosis Skin: No petechiae / purpura  Results:   Labs: Results for orders placed or performed during the  hospital encounter of 09/28/16 (from the past 48 hour(s))  Glucose, capillary     Status: Abnormal   Collection Time: 09/29/16  9:50 AM  Result Value Ref Range   Glucose-Capillary 114 (H) 65 - 99 mg/dL   Comment 1 Notify RN   Glucose, capillary     Status: Abnormal   Collection Time: 09/29/16 11:44 AM  Result Value Ref Range   Glucose-Capillary 118 (H) 65 - 99 mg/dL   Comment 1 Notify RN   Glucose, capillary     Status: Abnormal   Collection Time: 09/29/16  3:43 PM  Result Value Ref Range   Glucose-Capillary 126 (H) 65 - 99 mg/dL   Comment 1 Notify RN   Glucose, capillary     Status: Abnormal   Collection Time: 09/29/16  7:31 PM  Result Value Ref Range   Glucose-Capillary 149 (H) 65 - 99 mg/dL  Glucose, capillary     Status: Abnormal   Collection Time: 09/29/16 10:05 PM  Result Value Ref Range   Glucose-Capillary 146 (H) 65 - 99 mg/dL   Comment 1 Notify RN   Glucose, capillary     Status: Abnormal   Collection Time: 09/29/16 11:26 PM  Result Value Ref Range   Glucose-Capillary 161 (H) 65 - 99 mg/dL   Comment 1 Capillary Specimen    Comment 2 Notify RN   Glucose, capillary     Status: Abnormal   Collection Time: 09/30/16  3:12 AM  Result Value Ref Range   Glucose-Capillary 154 (H) 65 - 99 mg/dL   Comment 1 Capillary Specimen    Comment 2 Notify RN   CBC     Status: Abnormal   Collection Time: 09/30/16  5:00 AM  Result Value Ref Range   WBC 15.0 (H) 4.0 - 10.5 K/uL   RBC 2.61 (L) 3.87 - 5.11 MIL/uL   Hemoglobin 7.2 (L) 12.0 - 15.0 g/dL   HCT 21.4 (L) 36.0 - 46.0 %   MCV 82.0 78.0 - 100.0 fL   MCH 27.6 26.0 - 34.0 pg   MCHC 33.6 30.0 - 36.0 g/dL   RDW 16.9 (H) 11.5 - 15.5 %   Platelets 150 150 - 400 K/uL  Basic metabolic panel     Status: Abnormal   Collection Time: 09/30/16  5:00 AM  Result Value Ref Range   Sodium 134 (L) 135 - 145 mmol/L   Potassium 4.4 3.5 - 5.1 mmol/L   Chloride 107 101 - 111 mmol/L   CO2 22 22 - 32 mmol/L   Glucose, Bld 145 (H) 65 - 99 mg/dL    BUN 30 (H) 6 - 20 mg/dL   Creatinine, Ser 1.22 (H) 0.44 - 1.00 mg/dL   Calcium 8.2 (L) 8.9 - 10.3 mg/dL   GFR calc non Af Amer 45 (L) >60 mL/min   GFR calc Af Amer 52 (L) >60 mL/min    Comment: (NOTE) The eGFR has been calculated using the CKD EPI equation. This calculation has not been validated in all clinical situations. eGFR's persistently <60 mL/min signify possible Chronic Kidney Disease.    Anion gap 5 5 - 15  Magnesium     Status: None   Collection Time: 09/30/16  5:00 AM  Result Value Ref Range   Magnesium 2.1 1.7 - 2.4 mg/dL  Phosphorus     Status: None   Collection Time: 09/30/16  5:00 AM  Result Value Ref Range   Phosphorus 3.5 2.5 - 4.6 mg/dL  Prepare RBC     Status: None   Collection Time: 09/30/16  7:36 AM  Result Value Ref Range   Order Confirmation ORDER PROCESSED BY BLOOD BANK   Type and screen [place]     Status: None (Preliminary result)   Collection Time: 09/30/16  8:00 AM  Result Value Ref Range   ABO/RH(D) AB POS    Antibody Screen NEG    Sample Expiration 10/03/2016    Unit Number [id number]    Blood Component Type RED CELLS,LR    Unit division 00    Status of Unit ISSUED    Transfusion Status OK TO TRANSFUSE    Crossmatch Result Compatible   ABO/Rh     Status: None   Collection Time: 09/30/16  8:00 AM  Result Value Ref Range   ABO/RH(D) AB POS   Urinalysis, Routine w reflex microscopic     Status: Abnormal   Collection Time: 09/30/16  2:50 PM  Result Value Ref Range   Color, Urine YELLOW YELLOW   APPearance CLEAR CLEAR   Specific Gravity, Urine 1.009 1.005 - 1.030   pH 5.0 5.0 - 8.0   Glucose, UA NEGATIVE NEGATIVE mg/dL   Hgb urine dipstick MODERATE (A) NEGATIVE   Bilirubin Urine NEGATIVE NEGATIVE   Ketones, ur NEGATIVE NEGATIVE mg/dL   Protein, ur NEGATIVE NEGATIVE mg/dL   Nitrite NEGATIVE NEGATIVE   Leukocytes, UA TRACE (A) NEGATIVE   RBC / HPF 0-5 0 - 5 RBC/hpf   WBC, UA 0-5 0 - 5 WBC/hpf   Bacteria, UA  RARE (A) NONE SEEN   Squamous Epithelial / LPF 0-5 (A) NONE SEEN   Mucous PRESENT    Hyaline Casts, UA PRESENT   CBC     Status: Abnormal   Collection Time: 09/30/16  2:55 PM  Result Value Ref Range   WBC 17.5 (H) 4.0 - 10.5 K/uL   RBC 3.28 (L) 3.87 - 5.11 MIL/uL   Hemoglobin 9.1 (L) 12.0 - 15.0 g/dL    Comment: POST TRANSFUSION SPECIMEN   HCT 27.4 (L) 36.0 - 46.0 %   MCV 83.5 78.0 - 100.0 fL   MCH 27.7 26.0 - 34.0 pg   MCHC 33.2 30.0 - 36.0 g/dL   RDW 16.6 (H) 11.5 - 15.5 %   Platelets 137 (L) 150 - 400 K/uL  Renal function panel     Status: Abnormal   Collection Time: 10/01/16  2:42 AM  Result Value Ref Range   Sodium 136 135 - 145 mmol/L   Potassium 3.9 3.5 - 5.1 mmol/L   Chloride 105 101 - 111 mmol/L   CO2 24 22 - 32 mmol/L   Glucose, Bld 125 (H) 65 - 99 mg/dL   BUN 31 (H) 6 - 20 mg/dL   Creatinine, Ser 1.15 (H) 0.44 - 1.00 mg/dL   Calcium 8.3 (L) 8.9 - 10.3 mg/dL   Phosphorus 2.5 2.5 - 4.6 mg/dL   Albumin 2.5 (L) 3.5 - 5.0 g/dL   GFR calc non Af Amer 48 (L) >60 mL/min   GFR calc Af Amer 56 (L) >60 mL/min    Comment: (NOTE) The eGFR has been calculated using the CKD EPI equation. This calculation has not been validated in all  clinical situations. eGFR's persistently <60 mL/min signify possible Chronic Kidney Disease.    Anion gap 7 5 - 15  Magnesium     Status: None   Collection Time: 10/01/16  2:42 AM  Result Value Ref Range   Magnesium 2.0 1.7 - 2.4 mg/dL  CBC with Differential/Platelet     Status: Abnormal   Collection Time: 10/01/16  2:42 AM  Result Value Ref Range   WBC 13.1 (H) 4.0 - 10.5 K/uL   RBC 3.12 (L) 3.87 - 5.11 MIL/uL   Hemoglobin 8.6 (L) 12.0 - 15.0 g/dL   HCT 25.6 (L) 36.0 - 46.0 %   MCV 82.1 78.0 - 100.0 fL   MCH 27.6 26.0 - 34.0 pg   MCHC 33.6 30.0 - 36.0 g/dL   RDW 16.4 (H) 11.5 - 15.5 %   Platelets 144 (L) 150 - 400 K/uL   Neutrophils Relative % 85 %   Neutro Abs 11.2 (H) 1.7 - 7.7 K/uL   Lymphocytes Relative 5 %   Lymphs Abs 0.6 (L)  0.7 - 4.0 K/uL   Monocytes Relative 10 %   Monocytes Absolute 1.3 (H) 0.1 - 1.0 K/uL   Eosinophils Relative 0 %   Eosinophils Absolute 0.0 0.0 - 0.7 K/uL   Basophils Relative 0 %   Basophils Absolute 0.0 0.0 - 0.1 K/uL  Basic metabolic panel     Status: Abnormal   Collection Time: 10/01/16  2:42 AM  Result Value Ref Range   Sodium 134 (L) 135 - 145 mmol/L   Potassium 4.2 3.5 - 5.1 mmol/L    Comment: SPECIMEN HEMOLYZED. HEMOLYSIS MAY AFFECT INTEGRITY OF RESULTS.   Chloride 104 101 - 111 mmol/L   CO2 23 22 - 32 mmol/L   Glucose, Bld 123 (H) 65 - 99 mg/dL   BUN 30 (H) 6 - 20 mg/dL   Creatinine, Ser 1.09 (H) 0.44 - 1.00 mg/dL   Calcium 8.2 (L) 8.9 - 10.3 mg/dL   GFR calc non Af Amer 51 (L) >60 mL/min   GFR calc Af Amer 59 (L) >60 mL/min    Comment: (NOTE) The eGFR has been calculated using the CKD EPI equation. This calculation has not been validated in all clinical situations. eGFR's persistently <60 mL/min signify possible Chronic Kidney Disease.    Anion gap 7 5 - 15    Imaging / Studies: Dg Chest Port 1 View  Result Date: 09/30/2016 CLINICAL DATA:  Postop for bilateral hernia repair.  CHF. EXAM: PORTABLE CHEST 1 VIEW COMPARISON:  09/29/2016 FINDINGS: Endotracheal tube has been removed. Again noted is a left cardiac ICD. Stable enlargement of the cardiac silhouette. Hazy densities at the right lung base. Limited evaluation for left basilar disease due to the cardiomegaly. No evidence for overt pulmonary edema. There continues to be a small amount of subcutaneous gas but this has markedly decreased since 09/28/2016. There is lucency along the lateral left upper lung which is unchanged and a pneumothorax is not confidently identified. IMPRESSION: Stable cardiomegaly. Bibasilar densities may represent atelectasis. Difficult to exclude small effusions. No pulmonary edema. Decreasing subcutaneous gas. Although there is lucency along the lateral left lung, pneumothorax is not confidently  identified. Electronically Signed   By: Markus Daft M.D.   On: 09/30/2016 11:24    Medications / Allergies: per chart  Antibiotics: Anti-infectives    Start     Dose/Rate Route Frequency Ordered Stop   09/28/16 0616  ceFAZolin (ANCEF) 2-4 GM/100ML-% IVPB    Comments:  Precious Haws   :  cabinet override      09/28/16 0616 09/28/16 0756   09/28/16 0612  ceFAZolin (ANCEF) IVPB 2g/100 mL premix     2 g 200 mL/hr over 30 Minutes Intravenous On call to O.R. 09/28/16 4166 09/28/16 0756        Note: Portions of this report may have been transcribed using voice recognition software. Every effort was made to ensure accuracy; however, inadvertent computerized transcription errors may be present.   Any transcriptional errors that result from this process are unintentional.     Adin Hector, M.D., F.A.C.S. Gastrointestinal and Minimally Invasive Surgery [facility] [address] [place], [place] [address] [phone] Main / Paging   10/01/2016

## 2016-10-01 NOTE — Progress Notes (Addendum)
Patient ID: Sheri Dunn, female   DOB: [birth year], 66 y.o.   MRN: [id number]     SUBJECTIVE: Pt presented to [facility] on 09/28/16 for planned laparoscopic bilateral laparoscopic inguinal and obturator hernia repairs with mesh, laparoscopic umbilical hernia with mesh, needle aspiration of seroma, insertion of mesh with Dr. Michaell Cowing. Surgery was successfully completed. She was hypotensive peri-operatively requiring a phenylephrine and norepinephrine. There were some transient TWIs on telemetry perioperatively. Troponin returned at 0.06--> 0.06--> 0.06.   Post op, she was unable to be extubated; therefore, PCCM was called to assist with vent management.  She was extubated yesterday. ECG 4/27 showed sinus bradycardia HR 54 with TWI in inferior leads, similar to previous tracings as outpatient.   Patient diuresed well yesterday, net negative -1785 and weight down 2 lbs.  She is on Coreg, SBP 90s-100s (tolerating ok).  Creatinine stable.  Abdomen feels tight, still with pain at surgical site.  Breathing better.  Hemoglobin appropriately increased after 1 unit PRBCs yesterday.   Echo (10/17): EF 20-25%, mildly dilated LV, moderate AI, moderate MR, RV moderately dilated/moderately decreased systolic function.   Scheduled Meds: . acetaminophen  1,000 mg Oral TID  . aspirin  81 mg Per Tube Daily  . atorvastatin  20 mg Per Tube q1800  . carvedilol  12.5 mg Oral BID WC  . cholecalciferol  2,000 Units Oral Daily  . enoxaparin (LOVENOX) injection  40 mg Subcutaneous Q24H  . furosemide  60 mg Intravenous BID  . lip balm  1 application Topical BID  . mouth rinse  15 mL Mouth Rinse BID  . phenazopyridine  100 mg Oral TID WC  . psyllium  1 packet Oral BID  . sodium chloride flush  3 mL Intravenous Q12H  . spironolactone  12.5 mg Oral Daily   Continuous Infusions: . sodium chloride    . sodium chloride    . albumin human    . methocarbamol (ROBAXIN)  IV    . norepinephrine (LEVOPHED) Adult infusion Stopped  (09/30/16 0332)   PRN Meds:.sodium chloride, albumin human, alum & mag hydroxide-simeth, bisacodyl, diphenhydrAMINE **OR** diphenhydrAMINE, enalaprilat, hydrALAZINE, hydrALAZINE, HYDROmorphone (DILAUDID) injection, magic mouthwash, menthol-cetylpyridinium, methocarbamol (ROBAXIN)  IV, methocarbamol, methocarbamol, ondansetron **OR** ondansetron (ZOFRAN) IV, oxyCODONE, phenol, polyethylene glycol, prochlorperazine, simethicone, sodium chloride flush   Vitals:   10/01/16 0420 10/01/16 0500 10/01/16 0600 10/01/16 0700  BP: (!) 92/43 (!) 90/46 (!) 96/42 (!) 87/42  Pulse: 76 72 68 71  Resp: (!) 33 (!) 36 (!) 31 (!) 31  Temp: 98.8 F (37.1 C)     TempSrc: Oral     SpO2: 95% 93% 94% 91%  Weight: 133 lb (60.3 kg)     Height:        Intake/Output Summary (Last 24 hours) at 10/01/16 0803 Last data filed at 10/01/16 0600  Gross per 24 hour  Intake             1295 ml  Output             3200 ml  Net            -1905 ml    LABS: Basic Metabolic Panel:  Recent Labs  40/98/11 0500 10/01/16 0242  NA 134* 134*  136  K 4.4 4.2  3.9  CL 107 104  105  CO2 GLUCOSE 145* 123*  125*  BUN 30* 30*  31*  CREATININE 1.22* 1.09*  1.15*  CALCIUM 8.2* 8.2*  8.3*  MG 2.1  2.0  PHOS 3.5 2.5   Liver Function Tests:  Recent Labs  09/28/16 1753 10/01/16 0242  AST 20  --   ALT 11*  --   ALKPHOS 90  --   BILITOT 1.5*  --   PROT 4.6*  --   ALBUMIN 2.8* 2.5*   No results for input(s): LIPASE, AMYLASE in the last 72 hours. CBC:  Recent Labs  09/30/16 1455 10/01/16 0242  WBC 17.5* 13.1*  NEUTROABS  --  11.2*  HGB 9.1* 8.6*  HCT 27.4* 25.6*  MCV 83.5 82.1  PLT 137* 144*   Cardiac Enzymes:  Recent Labs  09/28/16 1233 09/28/16 1753 09/28/16 2240  CKTOTAL 75  --   --   CKMB 2.2  --   --   TROPONINI 0.06* 0.06* 0.06*   BNP: Invalid input(s): POCBNP D-Dimer: No results for input(s): DDIMER in the last 72 hours. Hemoglobin A1C: No results for input(s): HGBA1C  in the last 72 hours. Fasting Lipid Panel: No results for input(s): CHOL, HDL, LDLCALC, TRIG, CHOLHDL, LDLDIRECT in the last 72 hours. Thyroid Function Tests: No results for input(s): TSH, T4TOTAL, T3FREE, THYROIDAB in the last 72 hours.  Invalid input(s): FREET3 Anemia Panel: No results for input(s): VITAMINB12, FOLATE, FERRITIN, TIBC, IRON, RETICCTPCT in the last 72 hours.  RADIOLOGY: Dg Chest Port 1 View  Result Date: 09/30/2016 CLINICAL DATA:  Postop for bilateral hernia repair.  CHF. EXAM: PORTABLE CHEST 1 VIEW COMPARISON:  09/29/2016 FINDINGS: Endotracheal tube has been removed. Again noted is a left cardiac ICD. Stable enlargement of the cardiac silhouette. Hazy densities at the right lung base. Limited evaluation for left basilar disease due to the cardiomegaly. No evidence for overt pulmonary edema. There continues to be a small amount of subcutaneous gas but this has markedly decreased since 09/28/2016. There is lucency along the lateral left upper lung which is unchanged and a pneumothorax is not confidently identified. IMPRESSION: Stable cardiomegaly. Bibasilar densities may represent atelectasis. Difficult to exclude small effusions. No pulmonary edema. Decreasing subcutaneous gas. Although there is lucency along the lateral left lung, pneumothorax is not confidently identified. Electronically Signed   By: Richarda Overlie M.D.   On: 09/30/2016 11:24   Portable Chest Xray  Result Date: 09/29/2016 CLINICAL DATA:  Hypoxia EXAM: PORTABLE CHEST 1 VIEW COMPARISON:  September 28, 2016 FINDINGS: Endotracheal tube tip is 1.0 cm above the carina. Pacemaker lead is attached to the right ventricle. No pneumothorax. There is persistent subcutaneous emphysema. There is cardiomegaly with pulmonary venous hypertension. There is interstitial edema with bilateral pleural effusions. There is consolidation in the left lower lobe. IMPRESSION: Endotracheal tube is near the carina; advise withdrawing approximately 2  cm. No pneumothorax. Note that there is persistent subcutaneous emphysema. Underlying congestive heart failure. Question superimposed pneumonia left lower lobe. Electronically Signed   By: Bretta Bang III M.D.   On: 09/29/2016 08:08   Dg Chest Port 1 View  Result Date: 09/28/2016 CLINICAL DATA:  Initial evaluation for acute respiratory failure with hypoxia. EXAM: PORTABLE CHEST 1 VIEW COMPARISON:  Prior radiograph from 06/17/2015. FINDINGS: Patient is intubated with the tip of an endotracheal tube positioned 3.1 cm above the carina. Left-sided transvenous pacemaker/AICD in place. Moderate cardiomegaly, stable. Mediastinal silhouette normal. Aortic atherosclerosis. Lungs hypoinflated. Perihilar vascular congestion without overt pulmonary edema. Dense opacity within the retrocardiac left lower lobe may reflect atelectasis and/ or consolidation. Suspected left pleural effusion. No pneumothorax. Diffuse soft tissue emphysema seen within the bilateral chest walls and within the lower  left neck. No acute osseous abnormality. IMPRESSION: 1. Tip of the endotracheal tube 3.1 cm above the carina. 2. Dense opacity within the retrocardiac left lower lobe, which may reflect atelectasis and/or consolidation. Suspected small left pleural effusion. 3. Cardiomegaly with perihilar vascular congestion without overt pulmonary edema. 4. Scattered soft tissue emphysema within the chest wall bilaterally and lower left neck. Electronically Signed   By: Rise Mu M.D.   On: 09/28/2016 16:55    PHYSICAL EXAM General: NAD Neck: JVP difficult, appears elevated to 10-12. No thyromegaly or thyroid nodule.  Lungs: Crackles at bases bilaterally. CV: Nondisplaced PMI.  Heart regular S1/S2, no S3/S4.  Somewhat distant heart sounds, do not hear diastolic murmur.  1+ ankle edema.   Abdomen: Soft, nontender, no hepatosplenomegaly, moderate distention.  Neurologic: Alert and oriented x 3.  Psych: Normal  affect. Extremities: No clubbing or cyanosis.   TELEMETRY: Reviewed telemetry pt in NSR  ASSESSMENT AND PLAN: 66 yo with history of CAD s/p PCI RCA/LCX in 2016, ischemic cardiomyopathy, chronic systolic CHF had laparoscopic hernia repair and with post-op hypotension and volume overload.  1. Acute on chronic systolic CHF: With peri-operative hypotension.  Suspect hypotension related to sedating medications/anesthesia + baseline poor cardiac function.  Ischemic cardiomyopathy.  She has significant RV failure with ascites, per surgeon 3 L ascites removed during operation.  Last echo in 10/17 with EF 20-25%, mildly dilated LV, moderate AI, moderate MR, RV moderately dilated/moderately decreased systolic function.  On exam, I suspect that she is still somewhat volume overloaded but improved from yesterday after good diuresis. BP stable, she is back on home Coreg.  Wide pulse pressure may reflect aortic insufficiency (moderate on last echo, hard to hear murmur on exam today).  - Lasix 60 mg IV bid today, likely to po tomorrow.  - Can continue home Coreg as long as BP tolerates.  - Add back spironolactone 12.5 mg daily.  - Can restart ARB tomorrow perhaps if BP and creatinine stable.  2. Post-surgical: s/p laparoscopic hernia repair, per [facility].  3. CAD: DES to RCA and LCx in 2016.  TnI to 0.06 with no trend peri-operatively.  Suspect demand ischemia due to hypotension.  No changes on ECG post-op compared to prior to admit.  Continue ASA 81 and statin.  4. Anemia: Post-op.  Appropriate increase in hemoglobin with transfusion on 4/28.   With soft BP at times, would send to step-down rather than telemetry.   Marca Ancona 10/01/2016 8:03 AM

## 2016-10-02 LAB — BASIC METABOLIC PANEL
Anion gap: 7 (ref 5–15)
BUN: 28 mg/dL — ABNORMAL HIGH (ref 6–20)
CALCIUM: 8.3 mg/dL — AB (ref 8.9–10.3)
CO2: 24 mmol/L (ref 22–32)
CREATININE: 0.99 mg/dL (ref 0.44–1.00)
Chloride: 103 mmol/L (ref 101–111)
GFR calc non Af Amer: 58 mL/min — ABNORMAL LOW (ref >60)
Glucose, Bld: 124 mg/dL — ABNORMAL HIGH (ref 65–99)
Potassium: 3.7 mmol/L (ref 3.5–5.1)
SODIUM: 134 mmol/L — AB (ref 135–145)

## 2016-10-02 LAB — CBC
HCT: 25.2 % — ABNORMAL LOW (ref 36.0–46.0)
Hemoglobin: 8.3 g/dL — ABNORMAL LOW (ref 12.0–15.0)
MCH: 27.2 pg (ref 26.0–34.0)
MCHC: 32.9 g/dL (ref 30.0–36.0)
MCV: 82.6 fL (ref 78.0–100.0)
Platelets: 159 10*3/uL (ref 150–400)
RBC: 3.05 MIL/uL — AB (ref 3.87–5.11)
RDW: 16.7 % — ABNORMAL HIGH (ref 11.5–15.5)
WBC: 9.7 10*3/uL (ref 4.0–10.5)

## 2016-10-02 MED ORDER — FUROSEMIDE 80 MG PO TABS: 80.0000 mg | ORAL_TABLET | Freq: Every day | ORAL | Status: DC

## 2016-10-02 MED ORDER — ATORVASTATIN CALCIUM 20 MG PO TABS
20.0000 mg | ORAL_TABLET | Freq: Every day | ORAL | Status: DC
  Administered 2016-10-02 – 2016-10-07 (×6): 20 mg via ORAL
  Filled 2016-10-02 (×6): qty 1

## 2016-10-02 MED ORDER — ACETAMINOPHEN 500 MG PO TABS: 500.0000 mg | ORAL_TABLET | Freq: Four times a day (QID) | ORAL | Status: DC | PRN

## 2016-10-02 MED ORDER — FUROSEMIDE 80 MG PO TABS
80.0000 mg | ORAL_TABLET | Freq: Every day | ORAL | Status: DC
Start: 2016-10-03 — End: 2016-10-06
  Administered 2016-10-03 – 2016-10-06 (×4): 80 mg via ORAL
  Filled 2016-10-02 (×4): qty 1

## 2016-10-02 MED ORDER — OXYCODONE HCL 5 MG PO TABS
5.0000 mg | ORAL_TABLET | Freq: Four times a day (QID) | ORAL | Status: DC | PRN
  Administered 2016-10-02 – 2016-10-05 (×5): 10 mg via ORAL
  Administered 2016-10-05: 5 mg via ORAL
  Administered 2016-10-06: 10 mg via ORAL
  Filled 2016-10-02 (×3): qty 2
  Filled 2016-10-02: qty 1
  Filled 2016-10-02 (×3): qty 2

## 2016-10-02 MED ORDER — ASPIRIN 81 MG PO CHEW
81.0000 mg | CHEWABLE_TABLET | Freq: Every day | ORAL | Status: DC
  Administered 2016-10-02 – 2016-10-08 (×7): 81 mg via ORAL
  Filled 2016-10-02 (×7): qty 1

## 2016-10-02 NOTE — Progress Notes (Signed)
[facility]  [address] [place], [place] [address] Phone: [phone] FAX: [phone]   Sheri Dunn [id number] 01-24-1951    Problem List:   Principal Problem:   Ischemic cardiomyopathy Active Problems:   Essential hypertension   CAD S/P PCI April 2016   ICD (implantable cardioverter-defibrillator) in place   Bilateral inguinal hernias s/p lap repair with mesh 09/28/2016   Obturator hernias, bilateral, s/p lap repair with mesh 1/50/5697   Umbilical hernia s/p lap repair with mesh 09/28/2016   Inguinal hernia   Constipation, chronic   4 Days Post-Op  09/28/2016  POST-OPERATIVE DIAGNOSIS:    Bilateral inguinal hernias Bilateral obturator hernias Umbilical hernia LLQ seromas  PROCEDURE:    LAPAROSCOPIC BILATERAL INGUINAL HERNIA REPAIRS WITH MESH LAPAROSCOPIC BILATERAL OBTURATOR HERNIA REPAIRS WITH MESH LAPAROSCOPIC UMBILICAL HERNIA REPAIR WITH MESH Needle aspiration of seroma INSERTION OF MESH  SURGEON:  Adin Hector, MD    Assessment  Improving  Plan:  -telemetry -Foley for urinary retention.  The patient follow up next week for removal and voiding trial.  Okay to shower.  Avoid submerging the wound.  And had difficulty understanding this code just can't rapidly and around it".Discussed with nurse to help with further education. -solid diet -postop anemia stabilizing - follow.  Iron PO when having BMs -bowel regimen for constipation -BP per cardiology.  Probably gradually inc diuretics  D/w Dr Marigene Ehlers with Cards/Heart failure -troponins low - doubt MI.  Defer to cardiology  -VTE prophylaxis- SCDs, etc -mobilize as tolerated to help recovery  She is okay to discharge from a surgical standpoint.  The fact that she is walking the hallways is a hopeful sign that she can transition back home.  She says she has a lot of support and wishes to avoid a SNF.  Await clearance by cardiology to transition  home.  Adin Hector, M.D., F.A.C.S. Gastrointestinal and Minimally Invasive Surgery [facility] [address], [place], [place] [address] [phone] Main / Paging   10/02/2016  CARE TEAM:  PCP: Kandice Hams, MD  Outpatient Care Team: Patient Care Team: [name], MD as PCP - General (Internal Medicine) Sanda Klein, MD as Consulting Physician (Cardiology) Michael Boston, MD as Consulting Physician (General Surgery)  Inpatient Treatment Team: Treatment Team: Attending Provider: Michael Boston, MD; Consulting Physician: Sanda Klein, MD; Consulting Physician: Michae Kava Lbcardiology, MD; Consulting Physician: Raylene Miyamoto, MD; Registered Nurse: Levon Hedger, RN; Technician: Raynelle Highland, NT; Registered Nurse: Radonna Ricker, RN; Registered Nurse: Charmayne Sheer, RN; Physical Therapist: Galen Manila, PT  Subjective:  Doing well on telemetry.  Walked in hallways x3.  Prefers to sleep in a chair.  Had loose bowel movement .  Denies any abdominal pain or discomfort.  Notes that the binder slides up on her.   Objective:  Vital signs:  Vitals:   10/01/16 2200 10/01/16 2300 10/02/16 0249 10/02/16 0500  BP:  (!) 107/51 (!) 108/59   Pulse: 65 87 74   Resp: (!) 31 17 (!) 25   Temp:  98.3 F (36.8 C) 98.3 F (36.8 C)   TempSrc:  Oral Oral   SpO2: 96% 99% 99%   Weight:    61 kg (134 lb 7.7 oz)  Height:        Last BM Date: 09/26/16  Intake/Output   Yesterday:  04/29 0701 - 04/30 0700 In: 1210 [P.O.:960; IV Piggyback:250] Out: 5449 [Urine:1535] This shift:  No intake/output  data recorded.  Bowel function:  Flatus: YES  BM:  YES  Drain: (No drain)   Physical Exam:  General: Pt awake/alert/oriented x4 in no acute distress Eyes: PERRL, normal EOM.  Sclera clear.  No icterus Neuro: CN II-XII intact w/o focal sensory/motor deficits. Lymph: No head/neck/groin lymphadenopathy Psych:  No  delerium/psychosis/paranoia HENT: Normocephalic, Mucus membranes moist.  No thrush.  ETT in place Neck: Supple, No tracheal deviation Chest: No chest wall pain w good excursion CV:  Pulses intact.  Regular rhythm MS: Normal AROM mjr joints.  No obvious deformity Abdomen: Soft.  Chronic swelling consistent with chronic ascites. Mildy distended.  Mildly tender at incisions only.  Incisions c/d/I.  DRY.  Old hernia sacs reducible in both groins.  No evidence of peritonitis.  No incarcerated hernias. GU:  Foley clear yellow fluid Ext:  SCDs BLE.  2+mjr edema.  No cyanosis Skin: No petechiae / purpura  Results:   Labs: Results for orders placed or performed during the hospital encounter of 09/28/16 (from the past 48 hour(s))  Prepare RBC     Status: None   Collection Time: 09/30/16  7:36 AM  Result Value Ref Range   Order Confirmation ORDER PROCESSED BY BLOOD BANK   Type and screen [place]     Status: None   Collection Time: 09/30/16  8:00 AM  Result Value Ref Range   ABO/RH(D) AB POS    Antibody Screen NEG    Sample Expiration 10/03/2016    Unit Number [id number]    Blood Component Type RED CELLS,LR    Unit division 00    Status of Unit ISSUED,FINAL    Transfusion Status OK TO TRANSFUSE    Crossmatch Result Compatible   ABO/Rh     Status: None   Collection Time: 09/30/16  8:00 AM  Result Value Ref Range   ABO/RH(D) AB POS   Urinalysis, Routine w reflex microscopic     Status: Abnormal   Collection Time: 09/30/16  2:50 PM  Result Value Ref Range   Color, Urine YELLOW YELLOW   APPearance CLEAR CLEAR   Specific Gravity, Urine 1.009 1.005 - 1.030   pH 5.0 5.0 - 8.0   Glucose, UA NEGATIVE NEGATIVE mg/dL   Hgb urine dipstick MODERATE (A) NEGATIVE   Bilirubin Urine NEGATIVE NEGATIVE   Ketones, ur NEGATIVE NEGATIVE mg/dL   Protein, ur NEGATIVE NEGATIVE mg/dL   Nitrite NEGATIVE NEGATIVE   Leukocytes, UA TRACE (A) NEGATIVE   RBC / HPF 0-5 0 - 5 RBC/hpf    WBC, UA 0-5 0 - 5 WBC/hpf   Bacteria, UA RARE (A) NONE SEEN   Squamous Epithelial / LPF 0-5 (A) NONE SEEN   Mucous PRESENT    Hyaline Casts, UA PRESENT   Culture, Urine     Status: Abnormal   Collection Time: 09/30/16  2:51 PM  Result Value Ref Range   Specimen Description URINE, CLEAN CATCH    Special Requests NONE    Culture <10,000 COLONIES/mL INSIGNIFICANT GROWTH (A)    Report Status 10/01/2016 FINAL   CBC     Status: Abnormal   Collection Time: 09/30/16  2:55 PM  Result Value Ref Range   WBC 17.5 (H) 4.0 - 10.5 K/uL   RBC 3.28 (L) 3.87 - 5.11 MIL/uL   Hemoglobin 9.1 (L) 12.0 - 15.0 g/dL    Comment: POST TRANSFUSION SPECIMEN   HCT 27.4 (L) 36.0 - 46.0 %   MCV 83.5 78.0 - 100.0 fL   MCH  27.7 26.0 - 34.0 pg   MCHC 33.2 30.0 - 36.0 g/dL   RDW 16.6 (H) 11.5 - 15.5 %   Platelets 137 (L) 150 - 400 K/uL  Renal function panel     Status: Abnormal   Collection Time: 10/01/16  2:42 AM  Result Value Ref Range   Sodium 136 135 - 145 mmol/L   Potassium 3.9 3.5 - 5.1 mmol/L   Chloride 105 101 - 111 mmol/L   CO2 24 22 - 32 mmol/L   Glucose, Bld 125 (H) 65 - 99 mg/dL   BUN 31 (H) 6 - 20 mg/dL   Creatinine, Ser 1.15 (H) 0.44 - 1.00 mg/dL   Calcium 8.3 (L) 8.9 - 10.3 mg/dL   Phosphorus 2.5 2.5 - 4.6 mg/dL   Albumin 2.5 (L) 3.5 - 5.0 g/dL   GFR calc non Af Amer 48 (L) >60 mL/min   GFR calc Af Amer 56 (L) >60 mL/min    Comment: (NOTE) The eGFR has been calculated using the CKD EPI equation. This calculation has not been validated in all clinical situations. eGFR's persistently <60 mL/min signify possible Chronic Kidney Disease.    Anion gap 7 5 - 15  Magnesium     Status: None   Collection Time: 10/01/16  2:42 AM  Result Value Ref Range   Magnesium 2.0 1.7 - 2.4 mg/dL  CBC with Differential/Platelet     Status: Abnormal   Collection Time: 10/01/16  2:42 AM  Result Value Ref Range   WBC 13.1 (H) 4.0 - 10.5 K/uL   RBC 3.12 (L) 3.87 - 5.11 MIL/uL   Hemoglobin 8.6 (L) 12.0 - 15.0  g/dL   HCT 25.6 (L) 36.0 - 46.0 %   MCV 82.1 78.0 - 100.0 fL   MCH 27.6 26.0 - 34.0 pg   MCHC 33.6 30.0 - 36.0 g/dL   RDW 16.4 (H) 11.5 - 15.5 %   Platelets 144 (L) 150 - 400 K/uL   Neutrophils Relative % 85 %   Neutro Abs 11.2 (H) 1.7 - 7.7 K/uL   Lymphocytes Relative 5 %   Lymphs Abs 0.6 (L) 0.7 - 4.0 K/uL   Monocytes Relative 10 %   Monocytes Absolute 1.3 (H) 0.1 - 1.0 K/uL   Eosinophils Relative 0 %   Eosinophils Absolute 0.0 0.0 - 0.7 K/uL   Basophils Relative 0 %   Basophils Absolute 0.0 0.0 - 0.1 K/uL  Basic metabolic panel     Status: Abnormal   Collection Time: 10/01/16  2:42 AM  Result Value Ref Range   Sodium 134 (L) 135 - 145 mmol/L   Potassium 4.2 3.5 - 5.1 mmol/L    Comment: SPECIMEN HEMOLYZED. HEMOLYSIS MAY AFFECT INTEGRITY OF RESULTS.   Chloride 104 101 - 111 mmol/L   CO2 23 22 - 32 mmol/L   Glucose, Bld 123 (H) 65 - 99 mg/dL   BUN 30 (H) 6 - 20 mg/dL   Creatinine, Ser 1.09 (H) 0.44 - 1.00 mg/dL   Calcium 8.2 (L) 8.9 - 10.3 mg/dL   GFR calc non Af Amer 51 (L) >60 mL/min   GFR calc Af Amer 59 (L) >60 mL/min    Comment: (NOTE) The eGFR has been calculated using the CKD EPI equation. This calculation has not been validated in all clinical situations. eGFR's persistently <60 mL/min signify possible Chronic Kidney Disease.    Anion gap 7 5 - 15  Basic metabolic panel     Status: Abnormal   Collection Time: 10/02/16  2:51 AM  Result Value Ref Range   Sodium 134 (L) 135 - 145 mmol/L   Potassium 3.7 3.5 - 5.1 mmol/L   Chloride 103 101 - 111 mmol/L   CO2 24 22 - 32 mmol/L   Glucose, Bld 124 (H) 65 - 99 mg/dL   BUN 28 (H) 6 - 20 mg/dL   Creatinine, Ser 0.99 0.44 - 1.00 mg/dL   Calcium 8.3 (L) 8.9 - 10.3 mg/dL   GFR calc non Af Amer 58 (L) >60 mL/min   GFR calc Af Amer >60 >60 mL/min    Comment: (NOTE) The eGFR has been calculated using the CKD EPI equation. This calculation has not been validated in all clinical situations. eGFR's persistently <60 mL/min  signify possible Chronic Kidney Disease.    Anion gap 7 5 - 15  CBC     Status: Abnormal   Collection Time: 10/02/16  2:51 AM  Result Value Ref Range   WBC 9.7 4.0 - 10.5 K/uL   RBC 3.05 (L) 3.87 - 5.11 MIL/uL   Hemoglobin 8.3 (L) 12.0 - 15.0 g/dL   HCT 25.2 (L) 36.0 - 46.0 %   MCV 82.6 78.0 - 100.0 fL   MCH 27.2 26.0 - 34.0 pg   MCHC 32.9 30.0 - 36.0 g/dL   RDW 16.7 (H) 11.5 - 15.5 %   Platelets 159 150 - 400 K/uL    Imaging / Studies: Dg Chest Port 1 View  Result Date: 09/30/2016 CLINICAL DATA:  Postop for bilateral hernia repair.  CHF. EXAM: PORTABLE CHEST 1 VIEW COMPARISON:  09/29/2016 FINDINGS: Endotracheal tube has been removed. Again noted is a left cardiac ICD. Stable enlargement of the cardiac silhouette. Hazy densities at the right lung base. Limited evaluation for left basilar disease due to the cardiomegaly. No evidence for overt pulmonary edema. There continues to be a small amount of subcutaneous gas but this has markedly decreased since 09/28/2016. There is lucency along the lateral left upper lung which is unchanged and a pneumothorax is not confidently identified. IMPRESSION: Stable cardiomegaly. Bibasilar densities may represent atelectasis. Difficult to exclude small effusions. No pulmonary edema. Decreasing subcutaneous gas. Although there is lucency along the lateral left lung, pneumothorax is not confidently identified. Electronically Signed   By: Markus Daft M.D.   On: 09/30/2016 11:24    Medications / Allergies: per chart  Antibiotics: Anti-infectives    Start     Dose/Rate Route Frequency Ordered Stop   09/28/16 0616  ceFAZolin (ANCEF) 2-4 GM/100ML-% IVPB    Comments:  Precious Haws   : cabinet override      09/28/16 0616 09/28/16 0756   09/28/16 0612  ceFAZolin (ANCEF) IVPB 2g/100 mL premix     2 g 200 mL/hr over 30 Minutes Intravenous On call to O.R. 09/28/16 9628 09/28/16 0756        Note: Portions of this report may have been transcribed using  voice recognition software. Every effort was made to ensure accuracy; however, inadvertent computerized transcription errors may be present.   Any transcriptional errors that result from this process are unintentional.     Adin Hector, M.D., F.A.C.S. Gastrointestinal and Minimally Invasive Surgery [facility] [address] [place], [place] [address] [phone] Main / Paging   10/02/2016

## 2016-10-02 NOTE — Progress Notes (Signed)
Progress Note  Patient Name: Sheri Dunn Date of Encounter: 10/02/2016  Primary Cardiologist: Carmellia Kreisler  Subjective   A little melancholy, but physically feels better. Good UO, mild negative balance. Still has Foley catheter. Normal creatinine, improving BUN (almost normal) Weight is similar to admission weight.  Inpatient Medications    Scheduled Meds: . aspirin  81 mg Oral Daily  . atorvastatin  20 mg Oral q1800  . carvedilol  12.5 mg Oral BID WC  . cholecalciferol  2,000 Units Oral Daily  . enoxaparin (LOVENOX) injection  40 mg Subcutaneous Q24H  . furosemide  60 mg Intravenous BID  . lip balm  1 application Topical BID  . mouth rinse  15 mL Mouth Rinse BID  . psyllium  1 packet Oral BID  . sodium chloride flush  3 mL Intravenous Q12H  . spironolactone  12.5 mg Oral Daily   Continuous Infusions: . sodium chloride    . sodium chloride    . albumin human    . methocarbamol (ROBAXIN)  IV    . norepinephrine (LEVOPHED) Adult infusion Stopped (09/30/16 0332)   PRN Meds: sodium chloride, acetaminophen, albumin human, alum & mag hydroxide-simeth, bisacodyl, diphenhydrAMINE **OR** diphenhydrAMINE, enalaprilat, hydrALAZINE, hydrALAZINE, HYDROmorphone (DILAUDID) injection, magic mouthwash, menthol-cetylpyridinium, methocarbamol (ROBAXIN)  IV, methocarbamol, methocarbamol, ondansetron **OR** ondansetron (ZOFRAN) IV, oxyCODONE, phenol, polyethylene glycol, prochlorperazine, simethicone, sodium chloride flush   Vital Signs    Vitals:   10/02/16 0500 10/02/16 0800 10/02/16 1101 10/02/16 1548  BP:  (!) 99/45 (!) 103/52 (!) 101/52  Pulse:  72 69 68  Resp:  (!) 26 (!) 23 18  Temp:  98 F (36.7 C) 98.2 F (36.8 C) 98 F (36.7 C)  TempSrc:  Oral Oral Oral  SpO2:  97% 99% 98%  Weight: 61 kg (134 lb 7.7 oz)     Height:        Intake/Output Summary (Last 24 hours) at 10/02/16 1652 Last data filed at 10/02/16 1200  Gross per 24 hour  Intake              550 ml  Output              2180 ml  Net            -1630 ml   Filed Weights   09/28/16 0547 10/01/16 0420 10/02/16 0500  Weight: 61.2 kg (135 lb) 60.3 kg (133 lb) 61 kg (134 lb 7.7 oz)    Telemetry    NSR, PVCs - Personally Reviewed  Physical Exam  Quiet, frowning GEN: No acute distress.   Neck: 5-7 cm JVD Cardiac: RRR, no murmurs, rubs, or gallops. 1+ ankle edema Respiratory: Clear to auscultation bilaterally. GI: Soft, nontender, non-distended  MS: No edema; No deformity. Neuro:  Nonfocal  Psych: Normal affect   Labs    Chemistry Recent Labs Lab 09/28/16 1753  09/30/16 0500 10/01/16 0242 10/02/16 0251  NA 136  < > 134* 134*  136 134*  K 4.2  < > 4.4 4.2  3.9 3.7  CL 109  < > 107 104  105 103  CO2 20*  < > GLUCOSE 90  < > 145* 123*  125* 124*  BUN 29*  < > 30* 30*  31* 28*  CREATININE 1.04*  < > 1.22* 1.09*  1.15* 0.99  CALCIUM 8.3*  < > 8.2* 8.2*  8.3* 8.3*  PROT 4.6*  --   --   --   --  ALBUMIN 2.8*  --   --  2.5*  --   AST 20  --   --   --   --   ALT 11*  --   --   --   --   ALKPHOS 90  --   --   --   --   BILITOT 1.5*  --   --   --   --   GFRNONAA 54*  < > 45* 51*  48* 58*  GFRAA >60  < > 52* 59*  56* >60  ANIONGAP 7  < > < > = values in this interval not displayed.   Hematology Recent Labs Lab 09/30/16 1455 10/01/16 0242 10/02/16 0251  WBC 17.5* 13.1* 9.7  RBC 3.28* 3.12* 3.05*  HGB 9.1* 8.6* 8.3*  HCT 27.4* 25.6* 25.2*  MCV 83.5 82.1 82.6  MCH 27.7 27.6 27.2  MCHC 33.2 33.6 32.9  RDW 16.6* 16.4* 16.7*  PLT 137* 144* 159    Cardiac Enzymes Recent Labs Lab 09/28/16 1233 09/28/16 1753 09/28/16 2240  TROPONINI 0.06* 0.06* 0.06*   No results for input(s): TROPIPOC in the last 168 hours.   Patient Profile     66 y.o. female with history of CAD s/p PCI RCA/LCX in 2016, ischemic cardiomyopathy, chronic systolic CHF had laparoscopic hernia repair and with post-op hypotension and volume overload  Assessment & Plan    1.  CHF: close to her usual volume baseline. Suspect decompensation related to sudden volume change after ascites drainage in a patient with volume-dependent severe RV dysfunction. Change to PO diuretic. 2. CAD: angina-free. ASA and statin and carvedilol 3. Anemia: slowly drifting down after transfusion.    Signed, Thurmon Fair, MD  10/02/2016, 4:52 PM

## 2016-10-02 NOTE — Progress Notes (Signed)
Physical Therapy Evaluation  Clinical Impression Statement: Pt much improved from yesterday and agreeable to amb. Pt tolerated amb around entire unit however required RW for energy conservation. Mild SOB. Anticipate pt will progress quickly and be safe to d/c home with intermittent assist.    10/01/16 0800  PT Visit Information  Last PT Received On 10/01/16  Assistance Needed +1  History of Present Illness Pt admitted 4/26 and underwent a Bil laparoscopic inguinal and obturator hernia repairs with mesh, lap umbilical hernia with mesh, needle aspiration of seroma. PMH: CHF, EF 25%.  Precautions  Precautions None  Required Braces or Orthoses Other Brace/Splint  Other Brace/Splint abdominal bind  Restrictions  Weight Bearing Restrictions No  Home Living  Family/patient expects to be discharged to: Private residence  Living Arrangements Alone  Available Help at Discharge Family;Friend(s);Available PRN/intermittently  Type of Home House  Home Access Stairs to enter  Entrance Stairs-Number of Steps 4  Entrance Stairs-Rails Left  Home Layout Two level  Alternate Level Stairs-Number of Steps flight  Alternate Level Stairs-Rails Left  Bathroom Shower/Tub Tub/shower unit  Bathroom Toilet Handicapped height  Home Equipment None  Prior Function  Level of Independence Independent  Comments son or brother drives to grocery and MD appt, pt has never drove, pt takes baths  Communication  Communication No difficulties  Pain Assessment  Pain Assessment 0-10  Pain Score 8  Pain Location abdomen  Pain Descriptors / Indicators Sore  Pain Intervention(s) Monitored during session  Cognition  Arousal/Alertness Awake/alert  Behavior During Therapy WFL for tasks assessed/performed  Overall Cognitive Status Within Functional Limits for tasks assessed  Upper Extremity Assessment  Upper Extremity Assessment Generalized weakness  Lower Extremity Assessment  Lower Extremity Assessment Generalized  weakness  Cervical / Trunk Assessment  Cervical / Trunk Assessment Other exceptions  Cervical / Trunk Exceptions very distended abdomen  Bed Mobility  General bed mobility comments pt up in chair upon PT arrival  Transfers  Overall transfer level Needs assistance  Equipment used None  Transfers Sit to/from Stand  Sit to Stand Min guard  General transfer comment increased time due to abdominal soreness but able to complete without physical assist  Ambulation/Gait  Ambulation/Gait assistance Min guard  Ambulation Distance (Feet) 450 Feet  Assistive device Rolling walker (2 wheeled)  Gait Pattern/deviations Step-through pattern  General Gait Details mild SOB, v/c's to slow down for energy conservation. 3 standing rest breaks. pt progressively more dependent on RW with increased distance  Balance  Overall balance assessment No apparent balance deficits (not formally assessed)  General Comments  General comments (skin integrity, edema, etc.) very distended and swollen abdomen  PT - End of Session  Equipment Utilized During Treatment Gait belt;Other (comment) (abdominal binder)  Activity Tolerance Patient tolerated treatment well  Patient left in chair;with call bell/phone within reach  Nurse Communication Mobility status  PT Assessment  PT Recommendation/Assessment Patient needs continued PT services  PT Visit Diagnosis Muscle weakness (generalized) (M62.81)  PT Problem List Decreased strength;Decreased activity tolerance;Decreased balance;Decreased mobility;Decreased knowledge of use of DME;Pain  Barriers to Discharge Decreased caregiver support  Barriers to Discharge Comments lives alone but reports her brother can stay with her a few days  PT Plan  PT Frequency (ACUTE ONLY) Min 3X/week  PT Treatment/Interventions (ACUTE ONLY) DME instruction;Gait training;Stair training;Functional mobility training;Therapeutic activities;Therapeutic exercise;Balance training  AM-PAC PT "6 Clicks"  Daily Activity Outcome Measure  Difficulty turning over in bed (including adjusting bedclothes, sheets and blankets)? 3  Difficulty moving from lying  on back to sitting on the side of the bed?  3  Difficulty sitting down on and standing up from a chair with arms (e.g., wheelchair, bedside commode, etc,.)? 4  Help needed moving to and from a bed to chair (including a wheelchair)? 3  Help needed walking in hospital room? 3  Help needed climbing 3-5 steps with a railing?  3  6 Click Score 19  Mobility G Code  CJ  PT Recommendation  Follow Up Recommendations No PT follow up;Supervision - Intermittent  PT equipment Rolling walker with 5" wheels (may progress and not need it)  Individuals Consulted  Consulted and Agree with Results and Recommendations Patient  Acute Rehab PT Goals  Patient Stated Goal eat and go home  PT Goal Formulation With patient  Time For Goal Achievement 10/08/16  Potential to Achieve Goals Good  PT Time Calculation  PT Start Time (ACUTE ONLY) 0756  PT Stop Time (ACUTE ONLY) 0816  PT Time Calculation (min) (ACUTE ONLY) 20 min  PT General Charges  $$ ACUTE PT VISIT 1 Procedure  PT Evaluation  $PT Eval Low Complexity 1 Procedure  Written Expression  Dominant Hand Right    Lewis Shock, PT, DPT Pager #: [phone] Office #: [phone]

## 2016-10-02 NOTE — Care Management Important Message (Signed)
Important Message  Patient Details  Name: Sheri Dunn MRN: [id number] Date of Birth: [birth year]   Medicare Important Message Given:  Yes    Anesia Blackwell Abena 10/02/2016, 11:40 PM

## 2016-10-02 NOTE — Progress Notes (Signed)
Physical Therapy Treatment Patient Details Name: Sheri Dunn MRN: [id number] DOB: 03-15-1951 Today's Date: 10/02/2016    History of Present Illness Pt admitted 4/26 and underwent a Bil laparoscopic inguinal and obturator hernia repairs with mesh, lap umbilical hernia with mesh, needle aspiration of seroma. PMH: CHF, EF 25%.    PT Comments    Pt doing well with RW and progressed to ambulating with S.  Will work on stairs next session.  Did discuss stair management and energy conservation techniques.  Multiple family and friends in at end of session and she appears to have a good support system.   Follow Up Recommendations  No PT follow up;Supervision - Intermittent     Equipment Recommendations  Rolling walker with 5" wheels    Recommendations for Other Services       Precautions / Restrictions Precautions Precautions: None Other Brace/Splint: no longer had abd binder- MD removed earlier per pt Restrictions Weight Bearing Restrictions: No    Mobility  Bed Mobility               General bed mobility comments: Reviewed bed mobility with pt  Transfers Overall transfer level: Needs assistance Equipment used: None Transfers: Sit to/from Stand Sit to Stand: Min guard         General transfer comment: min/guard only  Ambulation/Gait Ambulation/Gait assistance: Min guard;Supervision Ambulation Distance (Feet): 480 Feet Assistive device: Rolling walker (2 wheeled) Gait Pattern/deviations: Step-through pattern Gait velocity: decreased   General Gait Details: Pt progressed from min/guard to S with gait. 2 brief standing rest breaks   Stairs            Wheelchair Mobility    Modified Rankin (Stroke Patients Only)       Balance Overall balance assessment: No apparent balance deficits (not formally assessed)                                          Cognition Arousal/Alertness: Awake/alert Behavior During Therapy: WFL for tasks  assessed/performed Overall Cognitive Status: Within Functional Limits for tasks assessed                                        Exercises      General Comments General comments (skin integrity, edema, etc.): reviewed energy conservation and stairs      Pertinent Vitals/Pain Pain Assessment: 0-10 Pain Score: 6  Pain Location: abdomen Pain Descriptors / Indicators: Sore;Operative site guarding Pain Intervention(s): Premedicated before session;Limited activity within patient's tolerance;Monitored during session    Home Living                      Prior Function            PT Goals (current goals can now be found in the care plan section) Acute Rehab PT Goals Patient Stated Goal: eat and go home PT Goal Formulation: With patient Time For Goal Achievement: 10/08/16 Potential to Achieve Goals: Good Progress towards PT goals: Progressing toward goals    Frequency    Min 3X/week      PT Plan Current plan remains appropriate    Co-evaluation              AM-PAC PT "6 Clicks" Daily Activity  Outcome Measure  Difficulty turning over in bed (  including adjusting bedclothes, sheets and blankets)?: A Little Difficulty moving from lying on back to sitting on the side of the bed? : A Little Difficulty sitting down on and standing up from a chair with arms (e.g., wheelchair, bedside commode, etc,.)?: None Help needed moving to and from a bed to chair (including a wheelchair)?: A Little Help needed walking in hospital room?: A Little Help needed climbing 3-5 steps with a railing? : A Little 6 Click Score: 19    End of Session   Activity Tolerance: Patient tolerated treatment well Patient left: in bed;with call bell/phone within reach;with family/visitor present;Other (comment) (sitting EOB with lunch) Nurse Communication: Mobility status PT Visit Diagnosis: Muscle weakness (generalized) (M62.81)     Time: 9562-1308 PT Time Calculation  (min) (ACUTE ONLY): 20 min  Charges:  $Gait Training: 8-22 mins                    G Codes:       Sheri Dunn L. Sheri Dunn, [place] Pager [phone] 10/02/2016    Sheri Dunn 10/02/2016, 1:30 PM

## 2016-10-02 NOTE — Care Management Note (Signed)
Case Management Note  Patient Details  Name: Sheri Dunn MRN: [id number] Date of Birth: March 09, 1951  Subjective/Objective:    From home alone, per pt eval no pt f/u needed, but she will need a rolling walker.  Her brother Sheri Dunn will be staying with her when she discharges.  POD 4 bil inguinal hernia repair, obturator , and umbilical hernia repairs.  Son in room with patient , patient has medication coverage and she has PCP, Dr. Nehemiah Settle.  Waiting for clearance from Cards.  Patient would like to get rolling walker from [facility], Lupita Leash notified.  Will need order.                  Action/Plan: NCM will cont to follow for dc needs.   Expected Discharge Date:  09/29/16               Expected Discharge Plan:  Home/Self Care  In-House Referral:     Discharge planning Services  CM Consult  Post Acute Care Choice:  Durable Medical Equipment Choice offered to:  Patient  DME Arranged:  Dan Humphreys rolling DME Agency:  [facility]  HH Arranged:    [facility] Agency:     Status of Service:  Completed, signed off  If discussed at Long Length of Stay Meetings, dates discussed:    Additional Comments:  Leone Haven, RN 10/02/2016, 3:44 PM

## 2016-10-03 LAB — BASIC METABOLIC PANEL
Anion gap: 6 (ref 5–15)
BUN: 22 mg/dL — ABNORMAL HIGH (ref 6–20)
CHLORIDE: 102 mmol/L (ref 101–111)
CO2: 26 mmol/L (ref 22–32)
CREATININE: 0.66 mg/dL (ref 0.44–1.00)
Calcium: 8.2 mg/dL — ABNORMAL LOW (ref 8.9–10.3)
Glucose, Bld: 83 mg/dL (ref 65–99)
POTASSIUM: 3.9 mmol/L (ref 3.5–5.1)
SODIUM: 134 mmol/L — AB (ref 135–145)

## 2016-10-03 LAB — CBC
HEMATOCRIT: 24.9 % — AB (ref 36.0–46.0)
Hemoglobin: 8.5 g/dL — ABNORMAL LOW (ref 12.0–15.0)
MCH: 28.2 pg (ref 26.0–34.0)
MCHC: 34.1 g/dL (ref 30.0–36.0)
MCV: 82.7 fL (ref 78.0–100.0)
PLATELETS: 161 10*3/uL (ref 150–400)
RBC: 3.01 MIL/uL — AB (ref 3.87–5.11)
RDW: 16.9 % — ABNORMAL HIGH (ref 11.5–15.5)
WBC: 7.3 10*3/uL (ref 4.0–10.5)

## 2016-10-03 MED ORDER — OXYCODONE HCL 5 MG PO TABS: 5.0000 mg | ORAL_TABLET | Freq: Four times a day (QID) | ORAL | 0 refills | Status: AC | PRN

## 2016-10-03 MED ORDER — PSYLLIUM 95 % PO PACK
2.0000 | PACK | Freq: Two times a day (BID) | ORAL | Status: DC
  Administered 2016-10-03 – 2016-10-05 (×4): 2 via ORAL
  Filled 2016-10-03 (×3): qty 2
  Filled 2016-10-03: qty 1
  Filled 2016-10-03 (×2): qty 2
  Filled 2016-10-03 (×2): qty 1
  Filled 2016-10-03 (×2): qty 2

## 2016-10-03 NOTE — Progress Notes (Signed)
No further intake volume documentation can be made. Unable to clear/appease the 'daily required documentation' icon.

## 2016-10-03 NOTE — Evaluation (Signed)
Occupational Therapy Evaluation and Discharge Patient Details Name: Sheri Dunn MRN: [id number] DOB: [birth year] Today's Date: 10/03/2016    History of Present Illness Pt admitted 4/26 and underwent a Bil laparoscopic inguinal and obturator hernia repairs with mesh, lap umbilical hernia with mesh, needle aspiration of seroma. PMH: CHF, EF 25%.   Clinical Impression   Pt overall functioning at a supervision level without DME. She will have assist of her brother at home. No further OT needs.    Follow Up Recommendations  No OT follow up    Equipment Recommendations  None recommended by OT    Recommendations for Other Services       Precautions / Restrictions Precautions Precaution Comments: will be taking foley home Restrictions Weight Bearing Restrictions: No      Mobility Bed Mobility Overal bed mobility: Modified Independent                Transfers Overall transfer level: Needs assistance Equipment used: None Transfers: Sit to/from Stand Sit to Stand: Supervision         General transfer comment: supervision, assist for lines    Balance                                           ADL either performed or assessed with clinical judgement   ADL Overall ADL's : Needs assistance/impaired Eating/Feeding: Independent;Sitting   Grooming: Oral care;Standing;Supervision/safety   Upper Body Bathing: Set up;Sitting   Lower Body Bathing: Sit to/from stand;Set up   Upper Body Dressing : Sitting;Set up   Lower Body Dressing: Sit to/from stand;Supervision/safety   Toilet Transfer: Supervision/safety;Ambulation           Functional mobility during ADLs: Supervision/safety General ADL Comments: pt able to cross foot over opposite knee to don and doff socks     Vision Baseline Vision/History: No visual deficits       Perception     Praxis      Pertinent Vitals/Pain Pain Assessment: Faces Faces Pain Scale: Hurts little more Pain  Location: abdomen Pain Descriptors / Indicators: Sore;Operative site guarding Pain Intervention(s): Monitored during session;Repositioned     Hand Dominance Right   Extremity/Trunk Assessment Upper Extremity Assessment Upper Extremity Assessment: Overall WFL for tasks assessed   Lower Extremity Assessment Lower Extremity Assessment: Defer to PT evaluation   Cervical / Trunk Assessment Cervical / Trunk Assessment: Other exceptions Cervical / Trunk Exceptions: very distended abdomen, laproscopic incisions   Communication Communication Communication: No difficulties   Cognition Arousal/Alertness: Awake/alert Behavior During Therapy: WFL for tasks assessed/performed Overall Cognitive Status: Impaired/Different from baseline Area of Impairment: Memory                     Memory: Decreased short-term memory         General Comments: pt unable to recall the reason for her foley and that she will be taking it home   General Comments       Exercises     Shoulder Instructions      Home Living Family/patient expects to be discharged to:: Private residence Living Arrangements: Alone Available Help at Discharge: Family;Friend(s);Available PRN/intermittently (brother will stay with her) Type of Home: House Home Access: Stairs to enter [employer] of Steps: 4 Entrance Stairs-Rails: Left Home Layout: Two level Alternate Level Stairs-Number of Steps: flight Alternate Level Stairs-Rails: Left Bathroom Shower/Tub: Tub/shower unit   [employer]  Toilet: Handicapped height     Home Equipment: Walker - 2 wheels          Prior Functioning/Environment Level of Independence: Independent        Comments: son or brother drives to grocery and MD appt, pt has never drove, pt takes baths        OT Problem List: Pain      OT Treatment/Interventions:      OT Goals(Current goals can be found in the care plan section) Acute Rehab OT Goals Patient Stated  Goal: get all the lines off of her  OT Frequency:     Barriers to D/C:            Co-evaluation              AM-PAC PT "6 Clicks" Daily Activity     Outcome Measure Help from another person eating meals?: None Help from another person taking care of personal grooming?: A Little Help from another person toileting, which includes using toliet, bedpan, or urinal?: A Little Help from another person bathing (including washing, rinsing, drying)?: None Help from another person to put on and taking off regular upper body clothing?: None Help from another person to put on and taking off regular lower body clothing?: None 6 Click Score: 22   End of Session    Activity Tolerance: Patient tolerated treatment well Patient left: in bed;with call bell/phone within reach;with family/visitor present                   Time: 1203-1225 OT Time Calculation (min): 22 min Charges:  OT General Charges $OT Visit: 1 Procedure OT Evaluation $OT Eval Moderate Complexity: 1 Procedure G-Codes:      Evern Bio 10/03/2016, 1:15 PM  [phone]

## 2016-10-03 NOTE — Progress Notes (Signed)
Patient transferred in wheelchair to 3E15 by NT.

## 2016-10-03 NOTE — Progress Notes (Signed)
Physical Therapy Treatment Patient Details Name: Sheri Dunn MRN: [id number] DOB: [birth year] Today's Date: 10/03/2016    History of Present Illness Pt admitted 4/26 and underwent a Bil laparoscopic inguinal and obturator hernia repairs with mesh, lap umbilical hernia with mesh, needle aspiration of seroma. PMH: CHF, EF 25%.    PT Comments    Patient continues to progress toward mobility goals and able to negotiate flight of stairs safely this session. VSS during session. Pt reported having much more energy than prior to surgery. Pt will continue to benefit from skilled PT in acute setting to maximize independence with mobility prior to d/c home.     Follow Up Recommendations  No PT follow up;Supervision - Intermittent     Equipment Recommendations  Rolling walker with 5" wheels    Recommendations for Other Services       Precautions / Restrictions Precautions Precautions: None Precaution Comments: will be taking foley home Restrictions Weight Bearing Restrictions: No    Mobility  Bed Mobility Overal bed mobility: Modified Independent             General bed mobility comments: increased time/effort needed; pt given cues for log roll technique in attempt to decrease pain with bed mobility  Transfers Overall transfer level: Needs assistance Equipment used: None Transfers: Sit to/from Stand Sit to Stand: Supervision         General transfer comment: supervision for safety; use of RW upon standing  Ambulation/Gait Ambulation/Gait assistance: Supervision Ambulation Distance (Feet): 200 Feet Assistive device: Rolling walker (2 wheeled) Gait Pattern/deviations: Step-through pattern Gait velocity: decreased   General Gait Details: slow, steady gait   Stairs Stairs: Yes   Stair Management: One rail Left;Step to pattern;Forwards Number of Stairs:  (flight) General stair comments: cues for step to pattern and safety  Wheelchair Mobility    Modified Rankin  (Stroke Patients Only)       Balance Overall balance assessment: No apparent balance deficits (not formally assessed)                                          Cognition Arousal/Alertness: Awake/alert Behavior During Therapy: WFL for tasks assessed/performed Overall Cognitive Status: Within Functional Limits for tasks assessed Area of Impairment: Memory                     Memory: Decreased short-term memory         General Comments: pt unable to recall the reason for her foley and that she will be taking it home      Exercises      General Comments General comments (skin integrity, edema, etc.): VSS during session      Pertinent Vitals/Pain Pain Assessment: Faces Faces Pain Scale: Hurts little more Pain Location: abdomen during transitional movements Pain Descriptors / Indicators: Sore;Operative site guarding Pain Intervention(s): Limited activity within patient's tolerance;Monitored during session;Premedicated before session;Repositioned    Home Living Family/patient expects to be discharged to:: Private residence Living Arrangements: Alone Available Help at Discharge: Family;Friend(s);Available PRN/intermittently (brother will stay with her) Type of Home: House Home Access: Stairs to enter Entrance Stairs-Rails: Left Home Layout: Two level Home Equipment: Walker - 2 wheels      Prior Function Level of Independence: Independent      Comments: son or brother drives to grocery and MD appt, pt has never drove, pt takes baths   PT Goals (  current goals can now be found in the care plan section) Acute Rehab PT Goals Patient Stated Goal: get all the lines off of her PT Goal Formulation: With patient Time For Goal Achievement: 10/08/16 Potential to Achieve Goals: Good Progress towards PT goals: Progressing toward goals    Frequency    Min 3X/week      PT Plan Current plan remains appropriate    Co-evaluation               AM-PAC PT "6 Clicks" Daily Activity  Outcome Measure  Difficulty turning over in bed (including adjusting bedclothes, sheets and blankets)?: A Little Difficulty moving from lying on back to sitting on the side of the bed? : A Little Difficulty sitting down on and standing up from a chair with arms (e.g., wheelchair, bedside commode, etc,.)?: None Help needed moving to and from a bed to chair (including a wheelchair)?: A Little Help needed walking in hospital room?: A Little Help needed climbing 3-5 steps with a railing? : A Little 6 Click Score: 19    End of Session Equipment Utilized During Treatment: Gait belt Activity Tolerance: Patient tolerated treatment well Patient left: in bed;with call bell/phone within reach;with family/visitor present Nurse Communication: Mobility status PT Visit Diagnosis: Muscle weakness (generalized) (M62.81)     Time: 1610-9604 PT Time Calculation (min) (ACUTE ONLY): 22 min  Charges:  $Gait Training: 8-22 mins                    G Codes:       Erline Levine, PTA Pager: [phone]     Carolynne Edouard 10/03/2016, 3:59 PM

## 2016-10-03 NOTE — Progress Notes (Signed)
Pt arrived to unit from step down 4E Alert and oriented in wheel chair standing with assistance, weight, tele-box  Applied, Vital stable. Patient rates pain 5 out of 10 when moving from abdominal surgical pain,  Foley dry intact and secure.plan to keep at discharge d/t the orientation of surgery. To switch to leg bag when dischared.

## 2016-10-03 NOTE — Progress Notes (Signed)
Progress Note  Patient Name: Sheri Dunn Date of Encounter: 10/03/2016  Primary Cardiologist: Camry Robello  Subjective   There were no acute events overnight. Pt's BP has been 94/54 to 124/109 range, and HR 68-70.  Her UOP has been about 3000 mL over past 24 hours.  Her lasix has been changed to oral starting today. Creatinin has improved to 0.66 and BUN to 22  Pt does not feel ready to go home and feels weak.    Inpatient Medications    Scheduled Meds: . aspirin  81 mg Oral Daily  . atorvastatin  20 mg Oral q1800  . carvedilol  12.5 mg Oral BID WC  . cholecalciferol  2,000 Units Oral Daily  . enoxaparin (LOVENOX) injection  40 mg Subcutaneous Q24H  . furosemide  80 mg Oral Daily  . lip balm  1 application Topical BID  . mouth rinse  15 mL Mouth Rinse BID  . psyllium  2 packet Oral BID  . sodium chloride flush  3 mL Intravenous Q12H  . spironolactone  12.5 mg Oral Daily   Continuous Infusions: . sodium chloride    . methocarbamol (ROBAXIN)  IV     PRN Meds: sodium chloride, acetaminophen, alum & mag hydroxide-simeth, bisacodyl, diphenhydrAMINE **OR** diphenhydrAMINE, enalaprilat, hydrALAZINE, hydrALAZINE, HYDROmorphone (DILAUDID) injection, magic mouthwash, menthol-cetylpyridinium, methocarbamol (ROBAXIN)  IV, methocarbamol, ondansetron **OR** ondansetron (ZOFRAN) IV, oxyCODONE, phenol, polyethylene glycol, prochlorperazine, simethicone, sodium chloride flush   Vital Signs    Vitals:   10/03/16 0500 10/03/16 0600 10/03/16 0753 10/03/16 0800  BP:  (!) 103/51 (!) 94/54 104/64  Pulse:  67 68 70  Resp:  18  (!) 22  Temp:    98.2 F (36.8 C)  TempSrc:    Oral  SpO2:  97%  96%  Weight: 134 lb 11.2 oz (61.1 kg)     Height:        Intake/Output Summary (Last 24 hours) at 10/03/16 1021 Last data filed at 10/03/16 0900  Gross per 24 hour  Intake             1080 ml  Output             3025 ml  Net            -1945 ml   Filed Weights   10/01/16 0420 10/02/16 0500  10/03/16 0500  Weight: 133 lb (60.3 kg) 134 lb 7.7 oz (61 kg) 134 lb 11.2 oz (61.1 kg)    Telemetry    NSR, PVCs - HR has been in the 70s  Physical Exam    GEN: No acute distress.   Cardiac: RRR, no murmurs, rubs, or gallops. 1+ ankle edema Respiratory: Clear to auscultation bilaterally. GI: Soft, nontender, non-distended  MS: No edema; No deformity.  Labs    Chemistry Recent Labs Lab 09/28/16 1753  10/01/16 0242 10/02/16 0251 10/03/16 0244  NA 136  < > 134*  136 134* 134*  K 4.2  < > 4.2  3.9 3.7 3.9  CL 109  < > 104  105 103 102  CO2 20*  < > GLUCOSE 90  < > 123*  125* 124* 83  BUN 29*  < > 30*  31* 28* 22*  CREATININE 1.04*  < > 1.09*  1.15* 0.99 0.66  CALCIUM 8.3*  < > 8.2*  8.3* 8.3* 8.2*  PROT 4.6*  --   --   --   --   ALBUMIN 2.8*  --  2.5*  --   --   AST 20  --   --   --   --   ALT 11*  --   --   --   --   ALKPHOS 90  --   --   --   --   BILITOT 1.5*  --   --   --   --   GFRNONAA 54*  < > 51*  48* 58* >60  GFRAA >60  < > 59*  56* >60 >60  ANIONGAP 7  < > < > = values in this interval not displayed.   Hematology  Recent Labs Lab 10/01/16 0242 10/02/16 0251 10/03/16 0244  WBC 13.1* 9.7 7.3  RBC 3.12* 3.05* 3.01*  HGB 8.6* 8.3* 8.5*  HCT 25.6* 25.2* 24.9*  MCV 82.1 82.6 82.7  MCH 27.6 27.2 28.2  MCHC 33.6 32.9 34.1  RDW 16.4* 16.7* 16.9*  PLT 144* 159 161    Cardiac Enzymes  Recent Labs Lab 09/28/16 1233 09/28/16 1753 09/28/16 2240  TROPONINI 0.06* 0.06* 0.06*   No results for input(s): TROPIPOC in the last 168 hours.   Patient Profile     66 y.o. female with history of CAD s/p PCI RCA/LCX in 2016, ischemic cardiomyopathy, chronic systolic CHF had laparoscopic hernia repair and with post-op hypotension and volume overload  Assessment & Plan    1. Acute on Chronic combined systolic and diastolic heart failure with peri-operative hypotension: Echo in oct 2017 shows EF 20-25% with moderate aortic and  mitral, and tricuspic insufficiency and PA pressure of 47. She received a medtronic AF defibrillator in Nov 2016. In the past, she continued to have some problems with hypervolemia. Per graph, pt's baseline weight seems to be 125-130, and today she is 134 lbs. During this hospitalization, she had sudden volume change decompensation after the drainage of ascites and was hypotensive likely because of baseline low cardiac function and sedating medications. She had good UOP overnight and her lasix has been changed to PO. She is continuing the coreg and the spironolactone. Her creatinien has been stable.   2. CAD: Pt denied any chest pain or anginal symptoms. She is on ASA and statin and carvedilol. She had a PCI to RCA/LCx in 2016   3. Anemia: HgB has been stable.   Signed, Deneise Lever, MD  10/03/2016, 10:21 AM    I have seen and examined the patient along with Deneise Lever, MD .  I have reviewed the chart, notes and new data.  I agree with his note.  Key new complaints: denies pain and dyspnea, but feels weak Key examination changes: 2+ bilateral pitting edema almost to the knees and marked JVD, about 8-10 cm above clavicle Key new findings / data: excellent renal parameters. Stable HGb  PLAN: Will see how she does with transition to oral diuretics, but needs to gradually diurese 10-15 lb. Would be delighted to transfer to the Cardiology service and take care of CHF optimization until dc.  Thurmon Fair, MD, [facility] [phone] 10/03/2016, 11:13 AM

## 2016-10-03 NOTE — Progress Notes (Signed)
[facility]  [address] [place], [place] [address] Phone: [phone] FAX: [phone]   Sheri Dunn [id number] 1951/02/12    Problem List:   Principal Problem:   Ischemic cardiomyopathy Active Problems:   Essential hypertension   CAD S/P PCI April 2016   ICD (implantable cardioverter-defibrillator) in place   Bilateral inguinal hernias s/p lap repair with mesh 09/28/2016   Obturator hernias, bilateral, s/p lap repair with mesh 01/12/9832   Umbilical hernia s/p lap repair with mesh 09/28/2016   Inguinal hernia   Constipation, chronic   5 Days Post-Op  09/28/2016  POST-OPERATIVE DIAGNOSIS:    Bilateral inguinal hernias Bilateral obturator hernias Umbilical hernia LLQ seromas  PROCEDURE:    LAPAROSCOPIC BILATERAL INGUINAL HERNIA REPAIRS WITH MESH LAPAROSCOPIC BILATERAL OBTURATOR HERNIA REPAIRS WITH MESH LAPAROSCOPIC UMBILICAL HERNIA REPAIR WITH MESH Needle aspiration of seroma INSERTION OF MESH  SURGEON:  Adin Hector, MD    Assessment  Improving  Plan:  -telemetry -Foley for urinary retention.  The patient follow up next week for removal and voiding trial.  Okay to shower.  Avoid submerging the wound.  And had difficulty understanding this code just can't rapidly and around it".Discussed with nurse to help with further education. -solid diet -postop anemia stabilizing - follow.  Iron PO when having BMs -bowel regimen for constipation.  increase -BP per cardiology.  Probably gradually inc diuretics  D/w Dr Marigene Ehlers with Cards/Heart failure -troponins low - doubt MI.  Defer to cardiology  -VTE prophylaxis- SCDs, etc -mobilize as tolerated to help recovery  She is okay to discharge from a surgical standpoint.  The fact that she is walking the hallways is a hopeful sign that she can transition back home.  She says she has a lot of support and wishes to avoid a SNF.  Await clearance by cardiology to  transition home.  Adin Hector, M.D., F.A.C.S. Gastrointestinal and Minimally Invasive Surgery [facility] [address], [place], [place] [address] [phone] Main / Paging   10/03/2016  CARE TEAM:  PCP: Kandice Hams, MD  Outpatient Care Team: Patient Care Team: [name], MD as PCP - General (Internal Medicine) Sanda Klein, MD as Consulting Physician (Cardiology) Michael Boston, MD as Consulting Physician (General Surgery)  Inpatient Treatment Team: Treatment Team: Attending Provider: Michael Boston, MD; Consulting Physician: Sanda Klein, MD; Consulting Physician: Michae Kava Lbcardiology, MD; Consulting Physician: Raylene Miyamoto, MD; Registered Nurse: Levon Hedger, RN; Technician: Raynelle Highland, NT; Registered Nurse: Charmayne Sheer, RN; Registered Nurse: Sandrea Hughs, RN; Physical Therapy Assistant: Darliss Cheney, PTA; Registered Nurse: Basil Dess, RN  Subjective:  No major events on telemetry.  Walked in hallways  Had loose bowel movement .  Denies only mild abdominal discomfort.   Objective:  Vital signs:  Vitals:   10/03/16 0351 10/03/16 0400 10/03/16 0500 10/03/16 0600  BP: (!) 100/47 105/71  (!) 103/51  Pulse: 64 71  67  Resp: (!) 21 18  18   Temp: 98.4 F (36.9 C)     TempSrc: Oral     SpO2: 94% 93%  97%  Weight:   61.1 kg (134 lb 11.2 oz)   Height:        Last BM Date: 10/02/16  Intake/Output   Yesterday:  04/30 0701 - 05/01 0700 In: 840 [P.O.:840] Out: 3125 [Urine:3125] This shift:  No intake/output data recorded.  Bowel function:  Flatus: YES  BM:  No  Drain: (  No drain)   Physical Exam:  General: Pt awake/alert/oriented x4 in no acute distress Eyes: PERRL, normal EOM.  Sclera clear.  No icterus Neuro: CN II-XII intact w/o focal sensory/motor deficits. Lymph: No head/neck/groin lymphadenopathy Psych:  No delerium/psychosis/paranoia HENT: Normocephalic, Mucus  membranes moist.  No thrush.  ETT in place Neck: Supple, No tracheal deviation Chest: No chest wall pain w good excursion CV:  Pulses intact.  Regular rhythm MS: Normal AROM mjr joints.  No obvious deformity Abdomen: Soft.  Chronic swelling consistent with chronic ascites. Mildy distended.  Mildly tender at incisions only.  Mod pannicular edema.  Incisions c/d/I.  DRY.  Old hernia sacs reducible in both groins.  No evidence of peritonitis.  No incarcerated hernias. GU:  Foley clear yellow fluid Ext:  SCDs BLE.  2+mjr edema.  No cyanosis Skin: No petechiae / purpura  Results:   Labs: Results for orders placed or performed during the hospital encounter of 09/28/16 (from the past 48 hour(s))  Basic metabolic panel     Status: Abnormal   Collection Time: 10/02/16  2:51 AM  Result Value Ref Range   Sodium 134 (L) 135 - 145 mmol/L   Potassium 3.7 3.5 - 5.1 mmol/L   Chloride 103 101 - 111 mmol/L   CO2 24 22 - 32 mmol/L   Glucose, Bld 124 (H) 65 - 99 mg/dL   BUN 28 (H) 6 - 20 mg/dL   Creatinine, Ser 0.99 0.44 - 1.00 mg/dL   Calcium 8.3 (L) 8.9 - 10.3 mg/dL   GFR calc non Af Amer 58 (L) >60 mL/min   GFR calc Af Amer >60 >60 mL/min    Comment: (NOTE) The eGFR has been calculated using the CKD EPI equation. This calculation has not been validated in all clinical situations. eGFR's persistently <60 mL/min signify possible Chronic Kidney Disease.    Anion gap 7 5 - 15  CBC     Status: Abnormal   Collection Time: 10/02/16  2:51 AM  Result Value Ref Range   WBC 9.7 4.0 - 10.5 K/uL   RBC 3.05 (L) 3.87 - 5.11 MIL/uL   Hemoglobin 8.3 (L) 12.0 - 15.0 g/dL   HCT 25.2 (L) 36.0 - 46.0 %   MCV 82.6 78.0 - 100.0 fL   MCH 27.2 26.0 - 34.0 pg   MCHC 32.9 30.0 - 36.0 g/dL   RDW 16.7 (H) 11.5 - 15.5 %   Platelets 159 150 - 400 K/uL  Basic metabolic panel     Status: Abnormal   Collection Time: 10/03/16  2:44 AM  Result Value Ref Range   Sodium 134 (L) 135 - 145 mmol/L   Potassium 3.9 3.5 - 5.1  mmol/L   Chloride 102 101 - 111 mmol/L   CO2 26 22 - 32 mmol/L   Glucose, Bld 83 65 - 99 mg/dL   BUN 22 (H) 6 - 20 mg/dL   Creatinine, Ser 0.66 0.44 - 1.00 mg/dL   Calcium 8.2 (L) 8.9 - 10.3 mg/dL   GFR calc non Af Amer >60 >60 mL/min   GFR calc Af Amer >60 >60 mL/min    Comment: (NOTE) The eGFR has been calculated using the CKD EPI equation. This calculation has not been validated in all clinical situations. eGFR's persistently <60 mL/min signify possible Chronic Kidney Disease.    Anion gap 6 5 - 15  CBC     Status: Abnormal   Collection Time: 10/03/16  2:44 AM  Result Value Ref Range  WBC 7.3 4.0 - 10.5 K/uL   RBC 3.01 (L) 3.87 - 5.11 MIL/uL   Hemoglobin 8.5 (L) 12.0 - 15.0 g/dL   HCT 24.9 (L) 36.0 - 46.0 %   MCV 82.7 78.0 - 100.0 fL   MCH 28.2 26.0 - 34.0 pg   MCHC 34.1 30.0 - 36.0 g/dL   RDW 16.9 (H) 11.5 - 15.5 %   Platelets 161 150 - 400 K/uL    Imaging / Studies: No results found.  Medications / Allergies: per chart  Antibiotics: Anti-infectives    Start     Dose/Rate Route Frequency Ordered Stop   09/28/16 0616  ceFAZolin (ANCEF) 2-4 GM/100ML-% IVPB    Comments:  Sheri Dunn   : cabinet override      09/28/16 0616 09/28/16 0756   09/28/16 0612  ceFAZolin (ANCEF) IVPB 2g/100 mL premix     2 g 200 mL/hr over 30 Minutes Intravenous On call to O.R. 09/28/16 4128 09/28/16 0756        Note: Portions of this report may have been transcribed using voice recognition software. Every effort was made to ensure accuracy; however, inadvertent computerized transcription errors may be present.   Any transcriptional errors that result from this process are unintentional.     Adin Hector, M.D., F.A.C.S. Gastrointestinal and Minimally Invasive Surgery [facility] [address] [place], [place] [address] [phone] Main / Paging   10/03/2016

## 2016-10-04 LAB — CBC
HCT: 25.7 % — ABNORMAL LOW (ref 36.0–46.0)
Hemoglobin: 8.6 g/dL — ABNORMAL LOW (ref 12.0–15.0)
MCH: 27.9 pg (ref 26.0–34.0)
MCHC: 33.5 g/dL (ref 30.0–36.0)
MCV: 83.4 fL (ref 78.0–100.0)
PLATELETS: 193 10*3/uL (ref 150–400)
RBC: 3.08 MIL/uL — AB (ref 3.87–5.11)
RDW: 16.8 % — ABNORMAL HIGH (ref 11.5–15.5)
WBC: 7.5 10*3/uL (ref 4.0–10.5)

## 2016-10-04 LAB — BASIC METABOLIC PANEL
ANION GAP: 6 (ref 5–15)
BUN: 17 mg/dL (ref 6–20)
CALCIUM: 8.4 mg/dL — AB (ref 8.9–10.3)
CO2: 27 mmol/L (ref 22–32)
CREATININE: 0.57 mg/dL (ref 0.44–1.00)
Chloride: 101 mmol/L (ref 101–111)
GFR calc non Af Amer: >60 mL/min (ref >60)
Glucose, Bld: 106 mg/dL — ABNORMAL HIGH (ref 65–99)
Potassium: 4.1 mmol/L (ref 3.5–5.1)
SODIUM: 134 mmol/L — AB (ref 135–145)

## 2016-10-04 MED ORDER — METOLAZONE 2.5 MG PO TABS
2.5000 mg | ORAL_TABLET | Freq: Once | ORAL | Status: AC
Start: 2016-10-04 — End: 2016-10-04
  Administered 2016-10-04: 2.5 mg via ORAL
  Filled 2016-10-04: qty 1

## 2016-10-04 NOTE — Progress Notes (Signed)
Progress Note  Patient Name: Sheri Dunn Date of Encounter: 10/04/2016  Primary Cardiologist: Olga Seyler  Subjective   There were no acute events overnight. Pt's BP has been 94/54 to 107/60 range, and HR 68-86.  Her UOP has been about 700 mL over past 24 hours. Cr is stable   Pt is concerned as her hernia repair site seems warm today and tender to touch.    Inpatient Medications    Scheduled Meds: . aspirin  81 mg Oral Daily  . atorvastatin  20 mg Oral q1800  . carvedilol  12.5 mg Oral BID WC  . cholecalciferol  2,000 Units Oral Daily  . enoxaparin (LOVENOX) injection  40 mg Subcutaneous Q24H  . furosemide  80 mg Oral Daily  . lip balm  1 application Topical BID  . mouth rinse  15 mL Mouth Rinse BID  . psyllium  2 packet Oral BID  . sodium chloride flush  3 mL Intravenous Q12H  . spironolactone  12.5 mg Oral Daily   Continuous Infusions: . sodium chloride    . methocarbamol (ROBAXIN)  IV     PRN Meds: sodium chloride, acetaminophen, alum & mag hydroxide-simeth, bisacodyl, diphenhydrAMINE **OR** diphenhydrAMINE, enalaprilat, hydrALAZINE, hydrALAZINE, HYDROmorphone (DILAUDID) injection, magic mouthwash, menthol-cetylpyridinium, methocarbamol (ROBAXIN)  IV, methocarbamol, ondansetron **OR** ondansetron (ZOFRAN) IV, oxyCODONE, phenol, polyethylene glycol, prochlorperazine, simethicone, sodium chloride flush   Vital Signs    Vitals:   10/03/16 1819 10/03/16 2039 10/04/16 0013 10/04/16 0654  BP: (!) 94/50 (!) 104/49 (!) 110/54 107/60  Pulse: 86 75 74 70  Resp:  Temp:  99.7 F (37.6 C) 99.4 F (37.4 C) 98.2 F (36.8 C)  TempSrc:  Oral Oral Oral  SpO2:  100% 96% 97%  Weight:    129 lb (58.5 kg)  Height:        Intake/Output Summary (Last 24 hours) at 10/04/16 1058 Last data filed at 10/04/16 1029  Gross per 24 hour  Intake              840 ml  Output             1550 ml  Net             -710 ml   Filed Weights   10/03/16 0500 10/03/16 1417 10/04/16  0654  Weight: 134 lb 11.2 oz (61.1 kg) 131 lb 4.8 oz (59.6 kg) 129 lb (58.5 kg)    Telemetry    NSR, PVCs - HR has been in the 70s  Physical Exam    GEN: No acute distress.   Cardiac: RRR, no murmurs, rubs, or gallops. 1+ pitting edema Respiratory: Clear to auscultation bilaterally. GI: Soft, nontender, non-distended .  Labs    Chemistry Recent Labs Lab 09/28/16 1753  10/01/16 0242 10/02/16 0251 10/03/16 0244 10/04/16 0539  NA 136  < > 134*  136 134* 134* 134*  K 4.2  < > 4.2  3.9 3.7 3.9 4.1  CL 109  < > 104  105 103 102 101  CO2 20*  < > GLUCOSE 90  < > 123*  125* 124* 83 106*  BUN 29*  < > 30*  31* 28* 22* 17  CREATININE 1.04*  < > 1.09*  1.15* 0.99 0.66 0.57  CALCIUM 8.3*  < > 8.2*  8.3* 8.3* 8.2* 8.4*  PROT 4.6*  --   --   --   --   --  ALBUMIN 2.8*  --  2.5*  --   --   --   AST 20  --   --   --   --   --   ALT 11*  --   --   --   --   --   ALKPHOS 90  --   --   --   --   --   BILITOT 1.5*  --   --   --   --   --   GFRNONAA 54*  < > 51*  48* 58* >60 >60  GFRAA >60  < > 59*  56* >60 >60 >60  ANIONGAP 7  < > < > = values in this interval not displayed.   Hematology  Recent Labs Lab 10/02/16 0251 10/03/16 0244 10/04/16 0539  WBC 9.7 7.3 7.5  RBC 3.05* 3.01* 3.08*  HGB 8.3* 8.5* 8.6*  HCT 25.2* 24.9* 25.7*  MCV 82.6 82.7 83.4  MCH 27.2 28.2 27.9  MCHC 32.9 34.1 33.5  RDW 16.7* 16.9* 16.8*  PLT 159 161 193    Cardiac Enzymes  Recent Labs Lab 09/28/16 1233 09/28/16 1753 09/28/16 2240  TROPONINI 0.06* 0.06* 0.06*   No results for input(s): TROPIPOC in the last 168 hours.   Patient Profile     66 y.o. female with history of CAD s/p PCI RCA/LCX in 2016, ischemic cardiomyopathy, chronic systolic CHF had laparoscopic hernia repair and with post-op hypotension and volume overload  Assessment & Plan    1. Acute on Chronic combined systolic and diastolic heart failure with peri-operative hypotension: Echo  in oct 2017 shows EF 20-25% with moderate aortic and mitral, and tricuspic insufficiency and PA pressure of 47. She received a medtronic AF defibrillator in Nov 2016. In the past, she continued to have some problems with hypervolemia. Per graph, pt's baseline weight seems to be 125-130, and today she is 134 lbs. During this hospitalization, she had sudden volume change decompensation after the drainage of ascites and was hypotensive likely because of baseline low cardiac function and sedating medications. She needs about 10 more lbs off.  Patient has diuresed 700 mL after changing to PO lasix yesterday. Her SCr has been stable.   -continue lasix 80 mg daily  -added metolazone once today- will monitor I&Os. -continue coreg and aldactone   2. Anemia: HgB has been stable.   3: surgical site tenderness: -Nurse paged surgery team as the site looks tender to touch and warm   Signed, Deneise Lever, MD  10/04/2016, 10:58 AM    I have seen and examined the patient along with Deneise Lever, MD .  I have reviewed the chart, notes and new data.  I agree with his note.  Key new complaints: worried about swelling and warmth at L groin surgical site Key examination changes: JVP a little lower, S3 present, L groin site is a little tender (to be expected and protuberant, but not excessively warm or red. Key new findings / data: excellent creatinine  PLAN: Continue diuresis. Oral diuretics are providing satisfactory effect, but will try to speed up a bit with a one time dose of metolazone. Still around 10 lb above target fluid weight.  Thurmon Fair, MD, [facility] [phone] 10/04/2016, 1:20 PM

## 2016-10-04 NOTE — Progress Notes (Signed)
Patient ID: De Libman, female   DOB: Nov 23, 1950, 66 y.o.   MRN: [id number]   I came by to see the patient since Dr. Michaell Cowing is operating all day at [facility].  She is complaining of swelling in the LLQ.  Her incisions all appear to be well-healed with no sign of infection.  No sign of recurrent umbilical or inguinal hernia on either side.  The swelling seems to be accumulated ascites or seroma.    She may try to wear an abdominal binder if it will stay down around her hips.  If it slides up around her lower chest, it is not beneficial and may actually interfere with her respiratory status.    I spoke with Dr. Michaell Cowing.  Wilmon Arms. Corliss Skains, MD, [facility]  General/ Trauma Surgery  10/04/2016 12:45 PM

## 2016-10-04 NOTE — Progress Notes (Signed)
Cardiologist rounding at Bedside. Paging Surgery for Swelling on left-Side and warmness.

## 2016-10-04 NOTE — Progress Notes (Signed)
Pt is alert and oriented in chair with Increased Pain in Left Lower Mid Abdomen states that it feels worse than it did after surgery, burning at Rate of 5.

## 2016-10-05 ENCOUNTER — Encounter (HOSPITAL_COMMUNITY): Payer: Self-pay | Admitting: Surgery

## 2016-10-05 DIAGNOSIS — K7460 Unspecified cirrhosis of liver: Secondary | ICD-10-CM | POA: Insufficient documentation

## 2016-10-05 LAB — BASIC METABOLIC PANEL
ANION GAP: 9 (ref 5–15)
BUN: 19 mg/dL (ref 6–20)
CO2: 27 mmol/L (ref 22–32)
Calcium: 8.4 mg/dL — ABNORMAL LOW (ref 8.9–10.3)
Chloride: 96 mmol/L — ABNORMAL LOW (ref 101–111)
Creatinine, Ser: 0.62 mg/dL (ref 0.44–1.00)
GFR calc non Af Amer: >60 mL/min (ref >60)
Glucose, Bld: 106 mg/dL — ABNORMAL HIGH (ref 65–99)
POTASSIUM: 3.8 mmol/L (ref 3.5–5.1)
Sodium: 132 mmol/L — ABNORMAL LOW (ref 135–145)

## 2016-10-05 LAB — CBC
HEMATOCRIT: 26.3 % — AB (ref 36.0–46.0)
HEMOGLOBIN: 8.6 g/dL — AB (ref 12.0–15.0)
MCH: 26.9 pg (ref 26.0–34.0)
MCHC: 32.7 g/dL (ref 30.0–36.0)
MCV: 82.2 fL (ref 78.0–100.0)
Platelets: 220 10*3/uL (ref 150–400)
RBC: 3.2 MIL/uL — ABNORMAL LOW (ref 3.87–5.11)
RDW: 16.5 % — ABNORMAL HIGH (ref 11.5–15.5)
WBC: 9.3 10*3/uL (ref 4.0–10.5)

## 2016-10-05 MED ORDER — SENNOSIDES-DOCUSATE SODIUM 8.6-50 MG PO TABS
1.0000 | ORAL_TABLET | Freq: Two times a day (BID) | ORAL | Status: DC
  Administered 2016-10-05 (×2): 1 via ORAL
  Filled 2016-10-05 (×5): qty 1

## 2016-10-05 MED ORDER — FOLIC ACID 1 MG PO TABS
1.0000 mg | ORAL_TABLET | Freq: Every day | ORAL | Status: DC
  Administered 2016-10-05 – 2016-10-08 (×4): 1 mg via ORAL
  Filled 2016-10-05 (×4): qty 1

## 2016-10-05 MED ORDER — POLYETHYLENE GLYCOL 3350 17 G PO PACK
34.0000 g | PACK | Freq: Two times a day (BID) | ORAL | Status: DC
  Administered 2016-10-05: 34 g via ORAL
  Filled 2016-10-05 (×2): qty 2

## 2016-10-05 MED ORDER — METOLAZONE 2.5 MG PO TABS
2.5000 mg | ORAL_TABLET | Freq: Once | ORAL | Status: AC
  Administered 2016-10-05: 2.5 mg via ORAL
  Filled 2016-10-05: qty 1

## 2016-10-05 MED ORDER — POLYETHYLENE GLYCOL 3350 17 G PO PACK: 17.0000 g | PACK | Freq: Every day | ORAL | Status: DC

## 2016-10-05 NOTE — Progress Notes (Signed)
Physical Therapy Treatment Patient Details Name: Sheri CurlsCarolyn Dunn MRN: [id number] DOB: [birth year] Today's Date: 10/05/2016    History of Present Illness Pt admitted 4/26 and underwent a Bil laparoscopic inguinal and obturator hernia repairs with mesh, lap umbilical hernia with mesh, needle aspiration of seroma. PMH: CHF, EF 25%.    PT Comments    Patient able to progress to ambulating without AD this session with supervision for safety. Continue to progress as tolerated. Current plan remains appropriate.    Follow Up Recommendations  No PT follow up;Supervision - Intermittent     Equipment Recommendations  Rolling walker with 5" wheels    Recommendations for Other Services       Precautions / Restrictions Precautions Precautions: None Restrictions Weight Bearing Restrictions: No    Mobility  Bed Mobility               General bed mobility comments: pt OOB in chair upon arrival  Transfers Overall transfer level: Modified independent Equipment used: None Transfers: Sit to/from Stand           General transfer comment: increased time and effort due to abdominal pain  Ambulation/Gait Ambulation/Gait assistance: Supervision Ambulation Distance (Feet): 200 Feet Assistive device: Rolling walker (2 wheeled);None Gait Pattern/deviations: Step-through pattern Gait velocity: decreased   General Gait Details: cues for cadence; pt able to ambulate without AD with supervision for safety; pt with grossly steady gait with only minor gait disturbances when performing horizontal/vertical head turns and directional changes   Stairs Stairs: Yes   Stair Management: One rail Left;Step to pattern;Forwards Number of Stairs:  (flight) General stair comments: cues for step to pattern and safety  Wheelchair Mobility    Modified Rankin (Stroke Patients Only)       Balance                                            Cognition Arousal/Alertness:  Awake/alert Behavior During Therapy: WFL for tasks assessed/performed Overall Cognitive Status: Within Functional Limits for tasks assessed                                        Exercises      General Comments General comments (skin integrity, edema, etc.): VSS throughout session      Pertinent Vitals/Pain Pain Assessment: Faces Faces Pain Scale: Hurts even more Pain Location: abdomen during transitional movements Pain Descriptors / Indicators: Sore;Operative site guarding;Grimacing Pain Intervention(s): Limited activity within patient's tolerance;Monitored during session;Repositioned    Home Living                      Prior Function            PT Goals (current goals can now be found in the care plan section) Acute Rehab PT Goals PT Goal Formulation: With patient Time For Goal Achievement: 10/08/16 Potential to Achieve Goals: Good Progress towards PT goals: Progressing toward goals    Frequency    Min 3X/week      PT Plan Current plan remains appropriate    Co-evaluation              AM-PAC PT "6 Clicks" Daily Activity  Outcome Measure  Difficulty turning over in bed (including adjusting bedclothes, sheets and blankets)?: A Little Difficulty moving from  lying on back to sitting on the side of the bed? : A Lot Difficulty sitting down on and standing up from a chair with arms (e.g., wheelchair, bedside commode, etc,.)?: A Little Help needed moving to and from a bed to chair (including a wheelchair)?: A Little Help needed walking in hospital room?: None Help needed climbing 3-5 steps with a railing? : A Little 6 Click Score: 18    End of Session Equipment Utilized During Treatment: Gait belt Activity Tolerance: Patient tolerated treatment well Patient left: with call bell/phone within reach;in chair Nurse Communication: Mobility status PT Visit Diagnosis: Muscle weakness (generalized) (M62.81)     Time: 1610-9604 PT Time  Calculation (min) (ACUTE ONLY): 21 min  Charges:  $Gait Training: 8-22 mins                    G Codes:       Erline Levine, PTA Pager: [phone]     Carolynne Edouard 10/05/2016, 4:42 PM

## 2016-10-05 NOTE — Progress Notes (Signed)
Called in OR  RN concerned with swelling in lower abdomen I noted pt has chronic ascites with seromas in her large inguinal and L flank hernia sacs.  Needs continued diuresis under cardiology Pt has not tolerated abd binder - slides up on chest

## 2016-10-05 NOTE — Progress Notes (Signed)
10 minute post void bladder scan completed with noted.

## 2016-10-05 NOTE — Progress Notes (Signed)
Progress Note  Patient Name: Sheri CurlsCarolyn Dunn Date of Encounter: 10/05/2016  Primary Cardiologist: Rommie Dunn  Subjective    There were no acute events overnight. Pt's BP has been 94/43 to 104/55 range, and HR 72-89.  Her UOP has been about 1200 mL over past 24 hours.  Net equal. Cr is stable   Pt had some swelling at the hernia repair site. She has chronic ascites with seroma  In inguinal and left flank hernia site. The swelling seems to be accumulated seroma. May be helped by continued diuresis.      Inpatient Medications    Scheduled Meds: . aspirin  81 mg Oral Daily  . atorvastatin  20 mg Oral q1800  . carvedilol  12.5 mg Oral BID WC  . cholecalciferol  2,000 Units Oral Daily  . enoxaparin (LOVENOX) injection  40 mg Subcutaneous Q24H  . furosemide  80 mg Oral Daily  . lip balm  1 application Topical BID  . mouth rinse  15 mL Mouth Rinse BID  . psyllium  2 packet Oral BID  . sodium chloride flush  3 mL Intravenous Q12H  . spironolactone  12.5 mg Oral Daily   Continuous Infusions: . sodium chloride    . methocarbamol (ROBAXIN)  IV     PRN Meds: sodium chloride, acetaminophen, alum & mag hydroxide-simeth, bisacodyl, diphenhydrAMINE **OR** diphenhydrAMINE, enalaprilat, hydrALAZINE, hydrALAZINE, HYDROmorphone (DILAUDID) injection, magic mouthwash, menthol-cetylpyridinium, methocarbamol (ROBAXIN)  IV, methocarbamol, ondansetron **OR** ondansetron (ZOFRAN) IV, oxyCODONE, phenol, polyethylene glycol, prochlorperazine, simethicone, sodium chloride flush   Vital Signs    Vitals:   10/04/16 1924 10/05/16 0036 10/05/16 0655 10/05/16 0758  BP: (!) 94/43 (!) 107/44 (!) 94/56 (!) 104/55  Pulse: 73 89 72 80  Resp: 18 18 18 16   Temp: 99 F (37.2 C) 98.8 F (37.1 C) 98.2 F (36.8 C) 98.2 F (36.8 C)  TempSrc: Oral Oral Oral Oral  SpO2: 92% 100% 98% 96%  Weight:   126 lb 9.6 oz (57.4 kg)   Height:        Intake/Output Summary (Last 24 hours) at 10/05/16 1007 Last data filed  at 10/05/16 0707  Gross per 24 hour  Intake              960 ml  Output             1450 ml  Net             -490 ml   Filed Weights   10/03/16 1417 10/04/16 0654 10/05/16 0655  Weight: 131 lb 4.8 oz (59.6 kg) 129 lb (58.5 kg) 126 lb 9.6 oz (57.4 kg)    Telemetry    NSR, PVCs - HR has been in the 70s  Physical Exam    GEN: No acute distress.   Cardiac: RRR, no murmurs, rubs, or gallops. 1+ pitting edema Respiratory: Clear to auscultation bilaterally. GI: Soft, nontender, non-distended . Some swelling at left hernia site, but no erythema or warmth.   Labs    Chemistry Recent Labs Lab 09/28/16 1753  10/01/16 0242  10/03/16 0244 10/04/16 0539 10/05/16 0415  NA 136  < > 134*  136  < > 134* 134* 132*  K 4.2  < > 4.2  3.9  < > 3.9 4.1 3.8  CL 109  < > 104  105  < > 102 101 96*  CO2 20*  < > 23  24  < > 26 27 27   GLUCOSE 90  < > 123*  125*  < >  83 106* 106*  BUN 29*  < > 30*  31*  < > 22* 17 19  CREATININE 1.04*  < > 1.09*  1.15*  < > 0.66 0.57 0.62  CALCIUM 8.3*  < > 8.2*  8.3*  < > 8.2* 8.4* 8.4*  PROT 4.6*  --   --   --   --   --   --   ALBUMIN 2.8*  --  2.5*  --   --   --   --   AST 20  --   --   --   --   --   --   ALT 11*  --   --   --   --   --   --   ALKPHOS 90  --   --   --   --   --   --   BILITOT 1.5*  --   --   --   --   --   --   GFRNONAA 54*  < > 51*  48*  < > >60 >60 >60  GFRAA >60  < > 59*  56*  < > >60 >60 >60  ANIONGAP 7  < > 7  7  < > 6 6 9   < > = values in this interval not displayed.   Hematology  Recent Labs Lab 10/03/16 0244 10/04/16 0539 10/05/16 0415  WBC 7.3 7.5 9.3  RBC 3.01* 3.08* 3.20*  HGB 8.5* 8.6* 8.6*  HCT 24.9* 25.7* 26.3*  MCV 82.7 83.4 82.2  MCH 28.2 27.9 26.9  MCHC 34.1 33.5 32.7  RDW 16.9* 16.8* 16.5*  PLT 161 193 220    Cardiac Enzymes  Recent Labs Lab 09/28/16 1233 09/28/16 1753 09/28/16 2240  TROPONINI 0.06* 0.06* 0.06*   No results for input(s): TROPIPOC in the last 168 hours.   Patient  Profile     66 y.o. female with history of CAD s/p PCI RCA/LCX in 2016, ischemic cardiomyopathy, chronic systolic CHF had laparoscopic hernia repair and with post-op hypotension and volume overload  Assessment & Plan    1. Acute on Chronic combined systolic and diastolic heart failure with peri-operative hypotension: Echo in oct 2017 shows EF 20-25% with moderate aortic and mitral, and tricuspic insufficiency and PA pressure of 47. She received a medtronic AF defibrillator in Nov 2016. In the past, she continued to have some problems with hypervolemia. Per graph, pt's baseline weight seems to be 125-130, and today she is 126 lbs. During this hospitalization, she had sudden volume change decompensation after the drainage of ascites and was hypotensive likely because of baseline low cardiac function and sedating medications. She is net negative 4 L since admission.   Patient has diuresed 1200 mL (et -25 ml) after giving metolazone yesterday. Her SCr has been stable.   -continue lasix 80 mg daily  -metolazone once, may benefit from another rmetolazone today as her renal function is stable  -monitor I&Os. -continue coreg and aldactone   2. Anemia: HgB has been stable.   3: surgical site tenderness: -most likely seroma   Signed, Deneise Lever, MD  10/05/2016, 10:07 AM    I have seen and examined the patient along with Deneise Lever, MD .  I have reviewed the chart, notes and new data.  I agree with his note.  Key new complaints: breathing well, not sore, but still worried about swelling at hernia repair site. No BM for several days Key examination changes: JVP lower - 5-6  cm, clear lungs 1+ edema, weight lower, but has ascites. BP low, but asymptomatic Key new findings / data: creat and Hgb stable.  PLAN: Switch to PO diuretics. Possible DC tomorrow. Voiding trial - hopefully can get rid of Foley before DC home. Miralax.  Thurmon Fair, MD, [facility] [phone] 10/05/2016, 11:40 AM

## 2016-10-06 DIAGNOSIS — R188 Other ascites: Secondary | ICD-10-CM

## 2016-10-06 LAB — CBC
HCT: 29 % — ABNORMAL LOW (ref 36.0–46.0)
Hemoglobin: 9.5 g/dL — ABNORMAL LOW (ref 12.0–15.0)
MCH: 27.1 pg (ref 26.0–34.0)
MCHC: 32.8 g/dL (ref 30.0–36.0)
MCV: 82.9 fL (ref 78.0–100.0)
PLATELETS: 189 10*3/uL (ref 150–400)
RBC: 3.5 MIL/uL — ABNORMAL LOW (ref 3.87–5.11)
RDW: 16.8 % — AB (ref 11.5–15.5)
WBC: 9 10*3/uL (ref 4.0–10.5)

## 2016-10-06 MED ORDER — METOLAZONE 5 MG PO TABS
5.0000 mg | ORAL_TABLET | Freq: Every day | ORAL | Status: DC
Start: 2016-10-06 — End: 2016-10-08
  Administered 2016-10-06 – 2016-10-08 (×3): 5 mg via ORAL
  Filled 2016-10-06 (×3): qty 1

## 2016-10-06 MED ORDER — ACETAMINOPHEN 500 MG PO TABS
1000.0000 mg | ORAL_TABLET | Freq: Three times a day (TID) | ORAL | Status: DC
  Administered 2016-10-06 – 2016-10-08 (×6): 1000 mg via ORAL
  Filled 2016-10-06 (×6): qty 2

## 2016-10-06 MED ORDER — POLYETHYLENE GLYCOL 3350 17 G PO PACK
17.0000 g | PACK | Freq: Two times a day (BID) | ORAL | Status: DC
  Filled 2016-10-06 (×2): qty 1

## 2016-10-06 MED ORDER — FUROSEMIDE 80 MG PO TABS
80.0000 mg | ORAL_TABLET | Freq: Two times a day (BID) | ORAL | Status: DC
  Administered 2016-10-07 – 2016-10-08 (×2): 80 mg via ORAL
  Filled 2016-10-06 (×3): qty 1

## 2016-10-06 MED ORDER — METHOCARBAMOL 500 MG PO TABS
750.0000 mg | ORAL_TABLET | Freq: Four times a day (QID) | ORAL | Status: DC
  Administered 2016-10-06 – 2016-10-08 (×6): 750 mg via ORAL
  Filled 2016-10-06 (×6): qty 2

## 2016-10-06 NOTE — Progress Notes (Signed)
CM following for DCP; No HHC needed at this time; patient may possibly need home oxygen at discharge; awaiting on qualifying 02 saturations for insurance coverage; Alexis GoodellB Ryanna Teschner RN,MHA,BSN [phone]

## 2016-10-06 NOTE — Discharge Instructions (Signed)
Ascites Ascites is a collection of excess fluid in the abdomen. Ascites can range from mild to severe. It can get worse without treatment. What are the causes? Possible causes include:  Cirrhosis. This is the most common cause of ascites.  Infection or inflammation in the abdomen.  Cancer in the abdomen.  Heart failure.  Kidney disease.  Inflammation of the pancreas.  Clots in the veins of the liver. What are the signs or symptoms? Signs and symptoms may include:  A feeling of fullness in your abdomen. This is common.  An increase in the size of your abdomen or your waist.  Swelling in your legs.  Swelling of the scrotum in men.  Difficulty breathing.  Abdominal pain.  Sudden weight gain. If the condition is mild, you may not have symptoms. How is this diagnosed? To make a diagnosis, your health care provider will:  Ask about your medical history.  Perform a physical exam.  Order imaging tests, such as an ultrasound or CT scan of your abdomen. How is this treated? Treatment depends on the cause of the ascites. It may include:  Taking a pill to make you urinate. This is called a water pill (diuretic pill).  Strictly reducing your salt (sodium) intake. Salt can cause extra fluid to be kept in the body, and this makes ascites worse.  Having a procedure to remove fluid from your abdomen (paracentesis).  Having a procedure to transfer fluid from your abdomen into a vein.  Having a procedure that connects two of the major veins within your liver and relieves pressure on your liver (TIPS procedure). Ascites may go away or improve with treatment of the condition that caused it. Follow these instructions at home:  Keep track of your weight. To do this, weigh yourself at the same time every day and record your weight.  Keep track of how much you drink and any changes in the amount you urinate.  Follow any instructions that your health care provider gives you about  how much to drink.  Try not to eat salty (high-sodium) foods.  Take medicines only as directed by your health care provider.  Keep all follow-up visits as directed by your health care provider. This is important.  Report any changes in your health to your health care provider, especially if you develop new symptoms or your symptoms get worse. Contact a health care provider if:  Your gain more than 3 pounds in 3 days.  Your abdominal size or your waist size increases.  You have new swelling in your legs.  The swelling in your legs gets worse. Get help right away if:  You develop a fever.  You develop confusion.  You develop new or worsening difficulty breathing.  You develop new or worsening abdominal pain.  You develop new or worsening swelling in the scrotum (in men). This information is not intended to replace advice given to you by your health care provider. Make sure you discuss any questions you have with your health care provider. Document Released: 05/22/2005 Document Revised: 09/29/2015 Document Reviewed: 12/19/2013 Elsevier Interactive Patient Education  2017 [employer]   Adductor Muscle Strain With Rehab Ask your health care provider which exercises are safe for you. Do exercises exactly as told by your health care provider and adjust them as directed. It is normal to feel mild stretching, pulling, tightness, or discomfort as you do these exercises, but you should stop right away if you feel sudden pain or your pain gets worse.Do  not begin these exercises until told by your health care provider. Strengthening exercises These exercises build strength and endurance in your thigh. Endurance is the ability to use your muscles for a long time, even after your muscles get tired. Exercise A: Hip adductor isometrics   1. Sit on a firm chair that positions your knees at about the same height as your hips. 2. Place a large ball, firm pillow, or rolled-up bath towel  between your thighs. 3. Squeeze your thighs together, gradually building tension. 4. Hold for __________ seconds. 5. Release the tension gradually. Allow your inner thigh muscles to relax completely before you start the next repetition. Repeat __________ times. Complete this exercise __________ times a day. Exercise B: Straight leg raises (  hip adductors) 1. Lie on your side so your head, shoulder, knee, and hip are in a straight line with each other. To help maintain your balance, you may put the foot of your top leg in front of the leg that is on the floor. Your left / right leg should be on the bottom. 2. Roll your hips slightly forward so your hips are stacked directly over each other and your left / right knee is facing forward. 3. Tense the muscles of your inner thigh and lift your bottom leg 4-6 inches (10-15 cm). 4. Hold this position for __________ seconds. 5. Slowly lower your leg to the starting position. 6. Allow the muscles to relax completely before you start the next repetition. Repeat __________ times. Complete this exercise __________ times a day. Exercise C: Straight leg raises ( hip extensors) 1. Lie on your belly on a bed or a firm surface with a pillow under your hips. 2. Tense your buttock muscles and lift your left / right thigh off the bed. Your left / right knee can be bent or straight, but do not let your back arch. 3. Hold this position for __________ seconds. 4. Slowly return to the starting position. 5. Allow your muscles to relax completely before you start the next repetition. Repeat __________ times. Complete this exercise __________ times a day. Balance exercises These exercises improve or maintain your balance. Balance is important in preventing falls. Exercise D: Single-leg balance 1. Stand near a railing or in a door frame that you can hold onto as needed. 2. Stand on your left / right foot. Keep your big toe down on the floor and try to keep your arch  lifted. 3. If this is too easy, you can stand with your eyes closed or stand on a pillow. 4. Hold this position for __________ seconds. Repeat __________ times. Complete this exercise __________ times a day. Exercise E: Side lunges 1. Stand with your feet together. 2. Keeping one foot in place, step to the side with your other foot about __________ inches (__________ cm). Do not step so far that you feel discomfort. 3. Push off from your stepping foot to return to the starting position. Repeat __________ times on each leg. Complete this exercise __________ times a day. This information is not intended to replace advice given to you by your health care provider. Make sure you discuss any questions you have with your health care provider. Document Released: 05/22/2005 Document Revised: 01/24/2016 Document Reviewed: 02/23/2015 Elsevier Interactive Patient Education  2017 [employer]vier Inc.    HERNIA REPAIR: POST OP INSTRUCTIONS  ######################################################################  EAT Gradually transition to a high fiber diet with a fiber supplement over the next few weeks after discharge.  Start with a pureed /  full liquid diet (see below)  WALK Walk an hour a day.  Control your pain to do that.    CONTROL PAIN Control pain so that you can walk, sleep, tolerate sneezing/coughing, go up/down stairs.  HAVE A BOWEL MOVEMENT DAILY Keep your bowels regular to avoid problems.  OK to try a laxative to override constipation.  OK to use an antidairrheal to slow down diarrhea.  Call if not better after 2 tries  CALL IF YOU HAVE PROBLEMS/CONCERNS Call if you are still struggling despite following these instructions. Call if you have concerns not answered by these instructions  ######################################################################    1. DIET: Follow a light bland diet the first 24 hours after arrival home, such as soup, liquids, crackers, etc.  Be sure to  include lots of fluids daily.  Avoid fast food or heavy meals as your are more likely to get nauseated.  Eat a low fat the next few days after surgery. 2. Take your usually prescribed home medications unless otherwise directed. 3. PAIN CONTROL: a. Pain is best controlled by a usual combination of three different methods TOGETHER: i. Ice/Heat ii. Over the counter pain medication iii. Prescription pain medication b. Most patients will experience some swelling and bruising around the hernia(s) such as the bellybutton, groins, or old incisions.  Ice packs or heating pads (30-60 minutes up to 6 times a day) will help. Use ice for the first few days to help decrease swelling and bruising, then switch to heat to help relax tight/sore spots and speed recovery.  Some people prefer to use ice alone, heat alone, alternating between ice & heat.  Experiment to what works for you.  Swelling and bruising can take several weeks to resolve.   c. It is helpful to take an over-the-counter pain medication regularly for the first few weeks.  Choose one of the following that works best for you: i. Naproxen (Aleve, etc)  Two 220mg  tabs twice a day ii. Ibuprofen (Advil, etc) Three 200mg  tabs four times a day (every meal & bedtime) iii. Acetaminophen (Tylenol, etc) 325-650mg  four times a day (every meal & bedtime) d. A  prescription for pain medication should be given to you upon discharge.  Take your pain medication as prescribed.  i. If you are having problems/concerns with the prescription medicine (does not control pain, nausea, vomiting, rash, itching, etc), please call us [phone] to see if we need to switch you to a different pain medicine that will work better for you and/or control your side effect better. ii. If you need a refill on your pain medication, please contact your pharmacy.  They will contact our office to request authorization. Prescriptions will not be filled after 5 pm or on week-ends. 4. Avoid  getting constipated.  Between the surgery and the pain medications, it is common to experience some constipation.  Increasing fluid intake and taking a fiber supplement (such as Metamucil, Citrucel, FiberCon, MiraLax, etc) 1-2 times a day regularly will usually help prevent this problem from occurring.  A mild laxative (prune juice, Milk of Magnesia, MiraLax, etc) should be taken according to package directions if there are no bowel movements after 48 hours.   5. Wash / shower every day.  You may shower over the dressings as they are waterproof.   6. Remove your waterproof bandages 5 days after surgery.  You may leave the incision open to air.  You may replace a dressing/Band-Aid to cover the incision for comfort if you wish.  Continue to shower over incision(s) after the dressing is off.    7. ACTIVITIES as tolerated:   a. You may resume regular (light) daily activities beginning the next day--such as daily self-care, walking, climbing stairs--gradually increasing activities as tolerated.  If you can walk 30 minutes without difficulty, it is safe to try more intense activity such as jogging, treadmill, bicycling, low-impact aerobics, swimming, etc. b. Save the most intensive and strenuous activity for last such as sit-ups, heavy lifting, contact sports, etc  Refrain from any heavy lifting or straining until you are off narcotics for pain control.   c. DO NOT PUSH THROUGH PAIN.  Let pain be your guide: If it hurts to do something, don't do it.  Pain is your body warning you to avoid that activity for another week until the pain goes down. d. You may drive when you are no longer taking prescription pain medication, you can comfortably wear a seatbelt, and you can safely maneuver your car and apply brakes. e. Bonita Quin may have sexual intercourse when it is comfortable.  8. FOLLOW UP in our office a. Please call [facility] at [phone] to set up an appointment to see your surgeon in the office for a follow-up  appointment approximately 2-3 weeks after your surgery. b. Make sure that you call for this appointment the day you arrive home to insure a convenient appointment time. 9.  IF YOU HAVE DISABILITY OR FAMILY LEAVE FORMS, BRING THEM TO THE OFFICE FOR PROCESSING.  DO NOT GIVE THEM TO YOUR DOCTOR.  WHEN TO CALL us [phone]: 1. Poor pain control 2. Reactions / problems with new medications (rash/itching, nausea, etc)  3. Fever over 101.5 F (38.5 C) 4. Inability to urinate 5. Nausea and/or vomiting 6. Worsening swelling or bruising 7. Continued bleeding from incision. 8. Increased pain, redness, or drainage from the incision   The clinic staff is available to answer your questions during regular business hours (8:30am-5pm).  Please dont hesitate to call and ask to speak to one of our nurses for clinical concerns.   If you have a medical emergency, go to the nearest emergency room or call 911.  A surgeon from [facility] is always on call at the hospitals in [facility], [place] [address], [place], [place]  [address] ?  [address] [address], [place], [place]   [address] MAIN: [phone] ? TOLL FREE: [phone] ? FAX: [phone] [email or web address]

## 2016-10-06 NOTE — Progress Notes (Signed)
Abdominal dressings removed per order. Incision clean and dry scabs present.

## 2016-10-06 NOTE — Progress Notes (Signed)
Patient has low BP R arm 83/41 HR 62, L arm 88/42 HR 58, Patient sleepy but easily arrousable. Patient asymptomatic, verbalized I'm okey. No complaints of being lightheaded, no n/v, no chest pain. Dinah Beers. [place] PA paged awaiting to call back. Will monitor accordingly. Patient eating dinner now.

## 2016-10-06 NOTE — Care Management Important Message (Signed)
Important Message  Patient Details  Name: Sheri Dunn MRN: [id number] Date of Birth: [birth year]   Medicare Important Message Given:  Yes    [name] 10/06/2016, 11:17 AM

## 2016-10-06 NOTE — Progress Notes (Addendum)
[place]  [address] [place], [place] [address] Phone: [phone] FAX: [phone]   Sheri Dunn [id number] 1951/02/16    Problem List:   Principal Problem:   Ischemic cardiomyopathy Active Problems:   Cirrhosis of liver with ascites [facility])   Essential hypertension   Elevated troponin   CAD S/P PCI April 2016   Aortic insufficiency   Chronic combined systolic and diastolic CHF (congestive heart failure) (HCC)   ICD (implantable cardioverter-defibrillator) in place   Bilateral inguinal hernias s/p lap repair with mesh 09/28/2016   Obturator hernias, bilateral, s/p lap repair with mesh 12/06/5007   Umbilical hernia s/p lap repair with mesh 09/28/2016   Constipation, chronic   8 Days Post-Op  09/28/2016  POST-OPERATIVE DIAGNOSIS:    Bilateral inguinal hernias Bilateral obturator hernias Umbilical hernia LLQ seromas  PROCEDURE:    LAPAROSCOPIC BILATERAL INGUINAL HERNIA REPAIRS WITH MESH LAPAROSCOPIC BILATERAL OBTURATOR HERNIA REPAIRS WITH MESH LAPAROSCOPIC UMBILICAL HERNIA REPAIR WITH MESH Needle aspiration of seroma INSERTION OF MESH  SURGEON:  Adin Hector, MD    Assessment  Stable  Plan:  Large seromas at LLQ & R inguinal region at site of former hernias expected in pt with massive ascites.  Hernia sacs filled up w ascites fluid.  Not incarcerated.  Not infected.  Stable.  Should stabilize & reduce over the next few months, especially if ascites can be better controlled.  Would not tap the areas as increases the risk a mesh infection which would be a major problem.  Patient feels reassured  -Urinary retention reolved  -bowel regimen for constipation.  Miralax BID  -Aggressive diuresis  -TED hose for BLE - defer to cardiology  She is okay to discharge from a surgical standpoint.  The fact that she is walking the hallways is a hopeful sign that she can transition back home.  She says she has a lot  of support and wishes to avoid a SNF.  Await clearance by cardiology to transition home.  Adin Hector, M.D., F.A.C.S. Gastrointestinal and Minimally Invasive Surgery [facility] [address], [place], [place] [address] [phone] Main / Paging   10/06/2016  CARE TEAM:  PCP: Kandice Hams, MD  Outpatient Care Team: Patient Care Team: [name], MD as PCP - General (Internal Medicine) Sanda Klein, MD as Consulting Physician (Cardiology) Michael Boston, MD as Consulting Physician (General Surgery)  Inpatient Treatment Team: Treatment Team: Attending Provider: Sanda Klein, MD; Consulting Physician: Sanda Klein, MD; Consulting Physician: Michae Kava Lbcardiology, MD; Consulting Physician: Raylene Miyamoto, MD; Technician: Raynelle Highland, NT; Consulting Physician: Michael Boston, MD; Technician: Threasa Heads, NT; Technician: Ninfa Meeker, NT; Registered Nurse: Felicie Morn, RN; Technician: [place] T Tripp  Subjective:  Concern by cardiology pt c/o pain Standing pain orders had been switched to PRN - I switched back. Patient returned from walk in hallway.  Cleared by PT She is concerned about swelling at old hernia sacs Sore but not in sereve pain No longer constipated Eating.  No N/V Urniating fine w/o catheter   Objective:  Vital signs:  Vitals:   10/05/16 2017 10/06/16 0406 10/06/16 0447 10/06/16 1150  BP: 98/69  (!) 106/50 (!) 97/54  Pulse: 70  84 80  Resp: 16   16  Temp: 98.1 F (36.7 C)  98.7 F (37.1 C) 98.7 F (37.1 C)  TempSrc: Oral  Oral Oral  SpO2: 97%  94% 98%  Weight:  57.4  kg (126 lb 9.6 oz)    Height:        Last BM Date: 10/06/16  Intake/Output   Yesterday:  05/03 0701 - 05/04 0700 In: 480 [P.O.:480] Out: 775 [Urine:775] This shift:  Total I/O In: 340 [P.O.:340] Out: 400 [Urine:400]  Bowel function:  Flatus: YES  BM:  YES  Drain: (No drain)   Physical Exam:  General:  Pt awake/alert/oriented x4 in no acute distress.  Moving slowly but close to her baseline Eyes: PERRL, normal EOM.  Sclera clear.  No icterus Neuro: CN II-XII intact w/o focal sensory/motor deficits. Lymph: No head/neck/groin lymphadenopathy Psych:  No delerium/psychosis/paranoia.  Calm.  Not confused.  Chatty HENT: Normocephalic, Mucus membranes moist.  No thrush.  ETT in place Neck: Supple, No tracheal deviation Chest: No chest wall pain w good excursion CV:  Pulses intact.  Regular rhythm MS: Normal AROM mjr joints.  No obvious deformity Abdomen: Soft.  . Moderately distended. Chronic swelling consistent with chronic ascites  Mildly tender at incisions only.  Mod pannicular edema.  Incisions c/d/I.  DRY.  Old hernia sacs R groin smooth & large.  No pain/fluctuance.  No evidence of peritonitis.  No incarcerated hernias. Ext:  3+ pitting edema to knees.  No cyanosis Skin: No petechiae / purpura  Results:   Labs: Results for orders placed or performed during the hospital encounter of 09/28/16 (from the past 48 hour(s))  Basic metabolic panel     Status: Abnormal   Collection Time: 10/05/16  4:15 AM  Result Value Ref Range   Sodium 132 (L) 135 - 145 mmol/L   Potassium 3.8 3.5 - 5.1 mmol/L   Chloride 96 (L) 101 - 111 mmol/L   CO2 27 22 - 32 mmol/L   Glucose, Bld 106 (H) 65 - 99 mg/dL   BUN 19 6 - 20 mg/dL   Creatinine, Ser 0.62 0.44 - 1.00 mg/dL   Calcium 8.4 (L) 8.9 - 10.3 mg/dL   GFR calc non Af Amer >60 >60 mL/min   GFR calc Af Amer >60 >60 mL/min    Comment: (NOTE) The eGFR has been calculated using the CKD EPI equation. This calculation has not been validated in all clinical situations. eGFR's persistently <60 mL/min signify possible Chronic Kidney Disease.    Anion gap 9 5 - 15  CBC     Status: Abnormal   Collection Time: 10/05/16  4:15 AM  Result Value Ref Range   WBC 9.3 4.0 - 10.5 K/uL   RBC 3.20 (L) 3.87 - 5.11 MIL/uL   Hemoglobin 8.6 (L) 12.0 - 15.0 g/dL   HCT  26.3 (L) 36.0 - 46.0 %   MCV 82.2 78.0 - 100.0 fL   MCH 26.9 26.0 - 34.0 pg   MCHC 32.7 30.0 - 36.0 g/dL   RDW 16.5 (H) 11.5 - 15.5 %   Platelets 220 150 - 400 K/uL  CBC     Status: Abnormal   Collection Time: 10/06/16  4:06 AM  Result Value Ref Range   WBC 9.0 4.0 - 10.5 K/uL   RBC 3.50 (L) 3.87 - 5.11 MIL/uL   Hemoglobin 9.5 (L) 12.0 - 15.0 g/dL   HCT 29.0 (L) 36.0 - 46.0 %   MCV 82.9 78.0 - 100.0 fL   MCH 27.1 26.0 - 34.0 pg   MCHC 32.8 30.0 - 36.0 g/dL   RDW 16.8 (H) 11.5 - 15.5 %   Platelets 189 150 - 400 K/uL    Imaging / Studies: No  results found.  Medications / Allergies: per chart  Antibiotics: Anti-infectives    Start     Dose/Rate Route Frequency Ordered Stop   09/28/16 0616  ceFAZolin (ANCEF) 2-4 GM/100ML-% IVPB    Comments:  Precious Haws   : cabinet override      09/28/16 0616 09/28/16 0756   09/28/16 0612  ceFAZolin (ANCEF) IVPB 2g/100 mL premix     2 g 200 mL/hr over 30 Minutes Intravenous On call to O.R. 09/28/16 3543 09/28/16 0756        Note: Portions of this report may have been transcribed using voice recognition software. Every effort was made to ensure accuracy; however, inadvertent computerized transcription errors may be present.   Any transcriptional errors that result from this process are unintentional.     Adin Hector, M.D., F.A.C.S. Gastrointestinal and Minimally Invasive Surgery [facility] [address] [place], [place] [address] [phone] Main / Paging   10/06/2016

## 2016-10-06 NOTE — Progress Notes (Signed)
Patient Name: Sheri Dunn Date of Encounter: 10/06/2016  Primary Cardiologist: Dr. [facility] Problem List     Principal Problem:   Ischemic cardiomyopathy Active Problems:   Essential hypertension   Elevated troponin   CAD S/P PCI April 2016   Aortic insufficiency   Chronic combined systolic and diastolic CHF (congestive heart failure) (HCC)   ICD (implantable cardioverter-defibrillator) in place   Bilateral inguinal hernias s/p lap repair with mesh 09/28/2016   Obturator hernias, bilateral, s/p lap repair with mesh 09/28/2016   Umbilical hernia s/p lap repair with mesh 09/28/2016   Constipation, chronic   Cirrhosis of liver with ascites (HCC)     Subjective   Breathing is okay. No chest pain. She is in severe pain at surgical site bringing her to tears. She has been up walking. She has had several BMs and has been urinating okay after foley removed.    Inpatient Medications    Scheduled Meds: . aspirin  81 mg Oral Daily  . atorvastatin  20 mg Oral q1800  . carvedilol  12.5 mg Oral BID WC  . cholecalciferol  2,000 Units Oral Daily  . enoxaparin (LOVENOX) injection  40 mg Subcutaneous Q24H  . folic acid  1 mg Oral Daily  . furosemide  80 mg Oral Daily  . lip balm  1 application Topical BID  . mouth rinse  15 mL Mouth Rinse BID  . polyethylene glycol  34 g Oral BID  . psyllium  2 packet Oral BID  . senna-docusate  1 tablet Oral BID  . sodium chloride flush  3 mL Intravenous Q12H  . spironolactone  12.5 mg Oral Daily   Continuous Infusions: . sodium chloride    . methocarbamol (ROBAXIN)  IV     PRN Meds: sodium chloride, acetaminophen, alum & mag hydroxide-simeth, bisacodyl, diphenhydrAMINE **OR** diphenhydrAMINE, enalaprilat, hydrALAZINE, hydrALAZINE, HYDROmorphone (DILAUDID) injection, magic mouthwash, menthol-cetylpyridinium, methocarbamol (ROBAXIN)  IV, methocarbamol, ondansetron **OR** ondansetron (ZOFRAN) IV, oxyCODONE, phenol, polyethylene glycol,  prochlorperazine, simethicone, sodium chloride flush   Vital Signs    Vitals:   10/05/16 1630 10/05/16 2017 10/06/16 0406 10/06/16 0447  BP: (!) 90/45 98/69  (!) 106/50  Pulse: 68 70  84  Resp: 18 16    Temp: 97.4 F (36.3 C) 98.1 F (36.7 C)  98.7 F (37.1 C)  TempSrc: Oral Oral  Oral  SpO2: 97% 97%  94%  Weight:   126 lb 9.6 oz (57.4 kg)   Height:        Intake/Output Summary (Last 24 hours) at 10/06/16 0842 Last data filed at 10/06/16 0600  Gross per 24 hour  Intake              480 ml  Output              550 ml  Net              -70 ml   Filed Weights   10/04/16 0654 10/05/16 0655 10/06/16 0406  Weight: 129 lb (58.5 kg) 126 lb 9.6 oz (57.4 kg) 126 lb 9.6 oz (57.4 kg)    Physical Exam   GEN: Well nourished, well developed, in no acute distress.  HEENT: Grossly normal.  Neck: Supple, no JVD, carotid bruits, or masses. Cardiac: RRR, no murmurs, rubs, or gallops. No clubbing, cyanosis.  Radials/DP/PT 2+ and equal bilaterally. 1+ pitting edema bilaterally  Respiratory:  Respirations regular and unlabored, clear to auscultation bilaterally. GI: Soft, nontender, nondistended, BS + x 4.  Some swelling at left hernia site, but no erythema or warmth. exquisitely tender to touch MS: no deformity or atrophy. Skin: warm and dry, no rash. Neuro:  Strength and sensation are intact. Psych: AAOx3.  Normal affect.  Labs    CBC  Recent Labs  10/05/16 0415 10/06/16 0406  WBC 9.3 9.0  HGB 8.6* 9.5*  HCT 26.3* 29.0*  MCV 82.2 82.9  PLT 220 189   Basic Metabolic Panel  Recent Labs  10/04/16 0539 10/05/16 0415  NA 134* 132*  K 4.1 3.8  CL 101 96*  CO2 27 27  GLUCOSE 106* 106*  BUN 17 19  CREATININE 0.57 0.62  CALCIUM 8.4* 8.4*     Telemetry    NSR with PVCs- Personally Reviewed  ECG    sinus bradycardia HR 54 with TWI in inferior leads, similar to previous tracings 09/29/16  - Personally Reviewed  Radiology    No results found.  Cardiac Studies    Echo: 03/31/17 LV EF: 20% - 25% Study Conclusions - Left ventricle: The cavity size was mildly dilated. Wall thickness was normal. Systolic function was severely reduced. The estimated ejection fraction was in the range of 20% to 25%. Severe diffuse hypokinesis. Features are consistent with a pseudonormal left ventricular filling pattern, with concomitant abnormal relaxation and increased filling pressure (grade 2 diastolic dysfunction). No evidence of thrombus. - Aortic valve: There was moderate regurgitation. - Mitral valve: There was moderate regurgitation. - Left atrium: The atrium was severely dilated. - Right ventricle: The cavity size was moderately dilated. Systolic function was moderately reduced. - Right atrium: The atrium was severely dilated. - Tricuspid valve: There was moderate regurgitation. - Pulmonary arteries: Systolic pressure was mildly to moderately increased. PA peak pressure: 47 mm Hg (S). - Pericardium, extracardiac: A trivial pericardial effusion was identified.  Patient Profile     Sheri Dunn is a 66 y.o. female with a history of severe ischemic cardiomyopathy, chronic systolic and diastolic biventricular HF, ascites with seroma, CAD s/p NSTEMI and DES to RCA and LCX 09/2014, s/p ICD November 2016 ([employer] Visia AF), PAFlutter, AI, HLD, who presented to [facility] on 09/28/16 for elective hernia repair. Cardiology consulted for perioperative hypotension and transient ECG changes. She was found to be volume overloaded and required IV diuresis.   Assessment & Plan    Acute on chronic combined S/D CHF: With peri-operative hypotension.  Suspect hypotension related to sedating medications/anesthesia + baseline poor cardiac function.  Ischemic cardiomyopathy.  She has significant RV failure with ascites, per surgeon 3 L ascites removed during operation.  Last echo in 10/17 with EF 20-25%, mildly dilated LV, moderate AI, moderate MR, RV moderately  dilated/moderately decreased systolic function.  -- She has been on IV lasix and given metolazone. She is net neg 4L. Weight down 9 lbs (135--> 126). Now on po lasix 80mg  daily   Multiple site hernia repair: patient complaining of left lower quadrant pain and swelling. We discussed going home today but she and son are reluctant given her persistent pain. I have called Dr. Michaell Cowing who will come and see patient. Her pain is mostly localized to her LLQ where there is significant swelling (felt to be accumulated ascites or seroma). She is afebrile and white count is normal. No significant drainage noted. She is voiding well and has had BM. We may need to be more aggressive with pain management. Plan will be for Dr. Michaell Cowing to see today, try to get pain under better control and discharge home  tomorrow.    Hypotension: BP has improved and now around her baseline ~106/50 this AM  Post op anemia: requiring transfusion. Hg has been stable ~9.5  Hx of PAF: maintaining NSR here. [facility].   CAD: continue ASA, statin and BB.   Signed, Cline CrockKathryn Thompson, PA-C  10/06/2016, 8:42 AM   I have seen and examined the patient along with Cline CrockKathryn Thompson, PA-C .  I have reviewed the chart, notes and new data.  I agree with PA's note.  Key new complaints: Continues to have substantial abdominal pain and distention, not ready for discharge; finally had a bowel movement and no longer has urinary retention. Key examination changes: Weight unchanged from yesterday, JVP roughly 10 cm, clear lungs, the left lower quadrant hernia site does appear more distended. No redness or peritoneal signs. Key new findings / data: Creatinine remains normal.  PLAN: Continue diuresis, target weight roughly 120 pounds. Will increase the dose of diuretics.  Thurmon FairMihai Terrian Ridlon, MD, [facility] [phone] 10/06/2016, 10:30 AM

## 2016-10-07 DIAGNOSIS — K4000 Bilateral inguinal hernia, with obstruction, without gangrene, not specified as recurrent: Secondary | ICD-10-CM

## 2016-10-07 DIAGNOSIS — I5042 Chronic combined systolic (congestive) and diastolic (congestive) heart failure: Secondary | ICD-10-CM

## 2016-10-07 DIAGNOSIS — I110 Hypertensive heart disease with heart failure: Secondary | ICD-10-CM

## 2016-10-07 LAB — BASIC METABOLIC PANEL
Anion gap: 9 (ref 5–15)
BUN: 23 mg/dL — AB (ref 6–20)
CALCIUM: 8.3 mg/dL — AB (ref 8.9–10.3)
CO2: 26 mmol/L (ref 22–32)
CREATININE: 0.76 mg/dL (ref 0.44–1.00)
Chloride: 92 mmol/L — ABNORMAL LOW (ref 101–111)
GFR calc Af Amer: >60 mL/min (ref >60)
GLUCOSE: 154 mg/dL — AB (ref 65–99)
Potassium: 3.4 mmol/L — ABNORMAL LOW (ref 3.5–5.1)
Sodium: 127 mmol/L — ABNORMAL LOW (ref 135–145)

## 2016-10-07 MED ORDER — CARVEDILOL 6.25 MG PO TABS: 6.2500 mg | ORAL_TABLET | Freq: Two times a day (BID) | ORAL | 5 refills | Status: DC

## 2016-10-07 MED ORDER — SODIUM CHLORIDE 0.9 % IV BOLUS (SEPSIS)
250.0000 mL | Freq: Once | INTRAVENOUS | Status: AC
  Administered 2016-10-07: 250 mL via INTRAVENOUS

## 2016-10-07 MED ORDER — CARVEDILOL 6.25 MG PO TABS
6.2500 mg | ORAL_TABLET | Freq: Two times a day (BID) | ORAL | Status: DC
  Administered 2016-10-08: 6.25 mg via ORAL
  Filled 2016-10-07: qty 1

## 2016-10-07 NOTE — Discharge Summary (Signed)
Discharge Summary    Patient ID: Sheri Dunn,  MRN: [id number], DOB/AGE: 1951/02/25 66 y.o.  Admit date: 09/28/2016 Discharge date: 10/07/2016  Primary Care Provider: Renford Dills Primary Cardiologist: Dr. Royann Shivers   Discharge Diagnoses    Principal Problem:   Ischemic cardiomyopathy Active Problems:   Essential hypertension   Elevated troponin   CAD S/P PCI April 2016   Aortic insufficiency   Chronic combined systolic and diastolic CHF (congestive heart failure) (HCC)   ICD (implantable cardioverter-defibrillator) in place   Hypertensive heart disease   Bilateral inguinal hernias s/p lap repair with mesh 09/28/2016   Obturator hernias, bilateral, s/p lap repair with mesh 09/28/2016   Umbilical hernia s/p lap repair with mesh 09/28/2016   Constipation, chronic   Cirrhosis, nonalcoholic   Poorly controlled ascites   Allergies Allergies  Allergen Reactions  . No Known Allergies     Diagnostic Studies/Procedures    None    History of Present Illness     Ms. Sheri Dunn is a 49F with chronic systolic and diastolic heart failure LVEF 20-25%, s/p ICD, hypertension, CAD s/p NSTEMI and PCI (DES to RCA and LCX 09/2014) who presented 09/28/16 for hernia repair.  She developed transient hypotension and EKG changes perioperatively and was treated with IVFs. She became volume overloaded and required IV diuresis and was admitted by cardiology.   Hospital Course     Patient was admitted and treated with IV diuretics. Her weight decreased from 135 lb to 125 lb. Her respiratory status improved. Her pain was controlled. She had no difficulties ambulating with rehab. She did complain of mild lightheaded and her BP was borderline low, thus her coreg was reduced to 6.25 mg BID. Physical therapy evaluated her and concluded that she did not need any assistive services at home. She was seen by general surgery who felt that she was stable for discharge. She was hypotensive 10/07/16 and was kept 24  hrs for gentle hydration.  Her weight is 124 pounds, which is her baseline. Dr Duke Salvia suggests she continue with Lasix 80 mg by mouth twice a day, metolazone 5mg  daily and spironolactone.  On 10/08/16, she was seen by Dr. Duke Salvia, who determined she was stable from a cardiac standpoint. Post hospital f/u will be arranged in 1 week.   Consultants: Dr Michaell Cowing  Discharge Vitals Blood pressure (!) 106/48, pulse 70, temperature 98.5 F (36.9 C), temperature source Oral, resp. rate 16, height 5\' 6"  (1.676 m), weight 125 lb 12.8 oz (57.1 kg), SpO2 97 %.  Filed Weights   10/05/16 0655 10/06/16 0406 10/07/16 0407  Weight: 126 lb 9.6 oz (57.4 kg) 126 lb 9.6 oz (57.4 kg) 125 lb 12.8 oz (57.1 kg)    Labs & Radiologic Studies    CBC  Recent Labs  10/05/16 0415 10/06/16 0406  WBC 9.3 9.0  HGB 8.6* 9.5*  HCT 26.3* 29.0*  MCV 82.2 82.9  PLT 220 189   Basic Metabolic Panel  Recent Labs  10/05/16 0415 10/07/16 1047  NA 132* 127*  K 3.8 3.4*  CL 96* 92*  CO2 27 26  GLUCOSE 106* 154*  BUN 19 23*  CREATININE 0.62 0.76  CALCIUM 8.4* 8.3*   Liver Function Tests No results for input(s): AST, ALT, ALKPHOS, BILITOT, PROT, ALBUMIN in the last 72 hours. No results for input(s): LIPASE, AMYLASE in the last 72 hours. Cardiac Enzymes No results for input(s): CKTOTAL, CKMB, CKMBINDEX, TROPONINI in the last 72 hours. BNP Invalid input(s): POCBNP D-Dimer No  results for input(s): DDIMER in the last 72 hours. Hemoglobin A1C No results for input(s): HGBA1C in the last 72 hours. Fasting Lipid Panel No results for input(s): CHOL, HDL, LDLCALC, TRIG, CHOLHDL, LDLDIRECT in the last 72 hours. Thyroid Function Tests No results for input(s): TSH, T4TOTAL, T3FREE, THYROIDAB in the last 72 hours.  Invalid input(s): FREET3 _____________  Dg Chest Port 1 View  Result Date: 09/30/2016 CLINICAL DATA:  Postop for bilateral hernia repair.  CHF. EXAM: PORTABLE CHEST 1 VIEW COMPARISON:  09/29/2016 FINDINGS:  Endotracheal tube has been removed. Again noted is a left cardiac ICD. Stable enlargement of the cardiac silhouette. Hazy densities at the right lung base. Limited evaluation for left basilar disease due to the cardiomegaly. No evidence for overt pulmonary edema. There continues to be a small amount of subcutaneous gas but this has markedly decreased since 09/28/2016. There is lucency along the lateral left upper lung which is unchanged and a pneumothorax is not confidently identified. IMPRESSION: Stable cardiomegaly. Bibasilar densities may represent atelectasis. Difficult to exclude small effusions. No pulmonary edema. Decreasing subcutaneous gas. Although there is lucency along the lateral left lung, pneumothorax is not confidently identified. Electronically Signed   By: Richarda OverlieAdam  Henn M.D.   On: 09/30/2016 11:24   Portable Chest Xray  Result Date: 09/29/2016 CLINICAL DATA:  Hypoxia EXAM: PORTABLE CHEST 1 VIEW COMPARISON:  September 28, 2016 FINDINGS: Endotracheal tube tip is 1.0 cm above the carina. Pacemaker lead is attached to the right ventricle. No pneumothorax. There is persistent subcutaneous emphysema. There is cardiomegaly with pulmonary venous hypertension. There is interstitial edema with bilateral pleural effusions. There is consolidation in the left lower lobe. IMPRESSION: Endotracheal tube is near the carina; advise withdrawing approximately 2 cm. No pneumothorax. Note that there is persistent subcutaneous emphysema. Underlying congestive heart failure. Question superimposed pneumonia left lower lobe. Electronically Signed   By: Bretta BangWilliam  Woodruff III M.D.   On: 09/29/2016 08:08   Dg Chest Port 1 View  Result Date: 09/28/2016 CLINICAL DATA:  Initial evaluation for acute respiratory failure with hypoxia. EXAM: PORTABLE CHEST 1 VIEW COMPARISON:  Prior radiograph from 06/17/2015. FINDINGS: Patient is intubated with the tip of an endotracheal tube positioned 3.1 cm above the carina. Left-sided  transvenous pacemaker/AICD in place. Moderate cardiomegaly, stable. Mediastinal silhouette normal. Aortic atherosclerosis. Lungs hypoinflated. Perihilar vascular congestion without overt pulmonary edema. Dense opacity within the retrocardiac left lower lobe may reflect atelectasis and/ or consolidation. Suspected left pleural effusion. No pneumothorax. Diffuse soft tissue emphysema seen within the bilateral chest walls and within the lower left neck. No acute osseous abnormality. IMPRESSION: 1. Tip of the endotracheal tube 3.1 cm above the carina. 2. Dense opacity within the retrocardiac left lower lobe, which may reflect atelectasis and/or consolidation. Suspected small left pleural effusion. 3. Cardiomegaly with perihilar vascular congestion without overt pulmonary edema. 4. Scattered soft tissue emphysema within the chest wall bilaterally and lower left neck. Electronically Signed   By: Rise MuBenjamin  McClintock M.D.   On: 09/28/2016 16:55   Disposition   Pt is being discharged home today in good condition.  Follow-up Plans & Appointments    Follow-up Information    Karie SodaGross, Steven, MD. Schedule an appointment as soon as possible for a visit in 3 week(s).   Specialty:  General Surgery Why:  To follow up after your operation, To follow up after your hospital stay Contact information: [address] [place] [address] [phone]        Thurmon Fairroitoru, Mihai, MD.  Schedule an appointment as soon as possible for a visit in 1 week(s).   Specialty:  Cardiology Why:  To follow up after your hospital stay Contact information: [address] [place] [address] [phone]        [facility] - Dme Follow up.   Why:  rolling walker Contact information: [address] [place] [address] [phone]          Discharge Instructions    Call MD for:    Complete by:  As directed    FEVER > 101.5 F  (temperatures < 101.5 F are not significant)    Call MD for:  extreme fatigue    Complete by:  As directed    Call MD for:  persistant dizziness or light-headedness    Complete by:  As directed    Call MD for:  persistant nausea and vomiting    Complete by:  As directed    Call MD for:  redness, tenderness, or signs of infection (pain, swelling, redness, odor or green/yellow discharge around incision site)    Complete by:  As directed    Call MD for:  severe uncontrolled pain    Complete by:  As directed    Diet - low sodium heart healthy    Complete by:  As directed    Follow a light diet the first few days at home.   Start with a bland diet such as soups, liquids, starchy foods, low fat foods, etc.   If you feel full, bloated, or constipated, stay on a full liquid or pureed/blenderized diet for a few days until you feel better and no longer constipated. Be sure to drink plenty of fluids every day to avoid getting dehydrated (feeling dizzy, not urinating, etc.). Gradually add a fiber supplement to your diet   Diet - low sodium heart healthy    Complete by:  As directed    Discharge instructions    Complete by:  As directed    See Discharge Instructions If you are not getting better after two weeks or are noticing you are getting worse, contact our office [phone] for further advice.  We may need to adjust your medications, re-evaluate you in the office, send you to the emergency room, or see what other things we can do to help. The clinic staff is available to answer your questions during regular business hours (8:30am-5pm).  Please don't hesitate to call and ask to speak to one of our nurses for clinical concerns.    A surgeon from [facility] is always on call at the hospitals 24 hours/day If you have a medical emergency, go to the nearest emergency room or call 911.   Driving Restrictions    Complete by:  As directed    You may drive when you are no longer taking narcotic prescription pain medication, you can  comfortably wear a seatbelt, and you can safely make sudden turns/stops to protect yourself without hesitating due to pain.   Increase activity slowly    Complete by:  As directed    Start light daily activities --- self-care, walking, climbing stairs- beginning the day after surgery.  Gradually increase activities as tolerated.  Control your pain to be active.  Stop when you are tired.  Ideally, walk several times a day, eventually an hour a day.   Most people are back to most day-to-day activities in a few weeks.  It takes 4-8 weeks to get  back to unrestricted, intense activity. If you can walk 30 minutes without difficulty, it is safe to try more intense activity such as jogging, treadmill, bicycling, low-impact aerobics, swimming, etc. Save the most intensive and strenuous activity for last (Usually 4-8 weeks after surgery) such as sit-ups, heavy lifting, contact sports, etc.  Refrain from any intense heavy lifting or straining until you are off narcotics for pain control.  You will have off days, but things should improve week-by-week. DO NOT PUSH THROUGH PAIN.  Let pain be your guide: If it hurts to do something, don't do it.  Pain is your body warning you to avoid that activity for another week until the pain goes down.   Increase activity slowly    Complete by:  As directed    Lifting restrictions    Complete by:  As directed    If you can walk 30 minutes without difficulty, it is safe to try more intense activity such as jogging, treadmill, bicycling, low-impact aerobics, swimming, etc. Save the most intensive and strenuous activity for last (Usually 4-8 weeks after surgery) such as sit-ups, heavy lifting, contact sports, etc.  Refrain from any intense heavy lifting or straining until you are off narcotics for pain control.  You will have off days, but things should improve week-by-week. DO NOT PUSH THROUGH PAIN.  Let pain be your guide: If it hurts to do something, don't do it.  Pain is your  body warning you to avoid that activity for another week until the pain goes down.   May walk up steps    Complete by:  As directed    No wound care    Complete by:  As directed    It is good for closed incision and even open wounds to be washed every day.  Shower every day.  Short baths are fine.  Wash the incisions and wounds clean with soap & water.    If you have a closed incision(s), wash the incision with soap & water every day.  You may leave closed incisions open to air if it is dry.   You may cover the incision with clean gauze & replace it after your daily shower for comfort. If you have skin tapes (Steristrips) or skin glue (Dermabond) on your incision, leave them in place.  They will fall off on their own like a scab.  You may trim any edges that curl up with clean scissors.  If you have staples, set up an appointment for them to be removed in the office in 10 days after surgery.  If you have a drain, wash around the skin exit site with soap & water and place a new dressing of gauze or band aid around the skin every day.  Keep the drain site clean & dry.   Sexual Activity Restrictions    Complete by:  As directed    You may have sexual intercourse when it is comfortable. If it hurts to do something, stop.      Discharge Medications   Current Discharge Medication List    START taking these medications   Details  oxyCODONE (OXY IR/ROXICODONE) 5 MG immediate release tablet Take 1-2 tablets (5-10 mg total) by mouth every 6 (six) hours as needed for moderate pain, severe pain or breakthrough pain. Qty: 30 tablet, Refills: 0      CONTINUE these medications which have CHANGED   Details  carvedilol (COREG) 6.25 MG tablet Take 1 tablet (6.25 mg total) by mouth 2 (  two) times daily with a meal. Qty: 60 tablet, Refills: 5      CONTINUE these medications which have NOT CHANGED   Details  aspirin 81 MG tablet Take 81 mg by mouth daily.    atorvastatin (LIPITOR) 20 MG tablet Take 1  tablet (20 mg total) by mouth daily. Qty: 30 tablet, Refills: 11   Associated Diagnoses: Hyperlipidemia    Cholecalciferol (VITAMIN D3) 2000 UNITS TABS Take 2,000 Units by mouth daily.    furosemide (LASIX) 40 MG tablet take 2 tablets (80 MG) by mouth once daily WHEN YOUR WEIGHT REACHES 117 POUNDS REDUCE BACK TO 1 TABLET (40MG ) EVERY DAY Qty: 90 tablet, Refills: 3    metolazone (ZAROXOLYN) 2.5 MG tablet Take 1 tablet (2.5 mg total) by mouth 3 (three) times a week. 30 minutes before taking Lasix on Mondays, Wednesdays, and Fridays. Qty: 45 tablet, Refills: 3    potassium chloride (K-DUR) 10 MEQ tablet Take 1 tablet (10 mEq total) by mouth daily. Qty: 30 tablet, Refills: 6    valsartan (DIOVAN) 160 MG tablet take 1 tablet by mouth once daily Qty: 90 tablet, Refills: 3    spironolactone (ALDACTONE) 25 MG tablet Take 1 tablet (25 mg total) by mouth daily. Qty: 90 tablet, Refills: 3          Outstanding Labs/Studies   None   Duration of Discharge Encounter   Greater than 30 minutes including physician time.  Signed, Robbie Lis PA-C 10/07/2016, 12:05 PM

## 2016-10-07 NOTE — Progress Notes (Signed)
PA made aware of hypotension this afternoon. Pt states she is "a little dizzy." Will allow pt to eat lunch and will recheck BP.

## 2016-10-07 NOTE — Progress Notes (Signed)
Patient Name: Sheri Dunn Date of Encounter: 10/07/2016  Primary Cardiologist: Dr. [facility]  Hospital Problem List     Principal Problem:   Ischemic cardiomyopathy Active Problems:   Essential hypertension   Elevated troponin   CAD S/P PCI April 2016   Aortic insufficiency   Chronic combined systolic and diastolic CHF (congestive heart failure) (HCC)   ICD (implantable cardioverter-defibrillator) in place   Hypertensive heart disease   Bilateral inguinal hernias s/p lap repair with mesh 09/28/2016   Obturator hernias, bilateral, s/p lap repair with mesh 09/28/2016   Umbilical hernia s/p lap repair with mesh 09/28/2016   Constipation, chronic   Cirrhosis, nonalcoholic   Poorly controlled ascites     Subjective   There were no acute events overnight. She denies any dyspnea or chest pain. She has been walking and says her pain is slightly better.   Her BP was 83/41 to 106/48  And HR was stable at 70s. Telemetry was reviewed which shows frequent PVCs.   She was evaluated by general surgery again who thought that the seroma is expected in pt with existing ascites and that there were no signs of infection. This should resolve over few months and tapping may increase risk of mesh infection. She is clear for discharge.      Inpatient Medications    Scheduled Meds: . acetaminophen  1,000 mg Oral TID  . aspirin  81 mg Oral Daily  . atorvastatin  20 mg Oral q1800  . carvedilol  12.5 mg Oral BID WC  . cholecalciferol  2,000 Units Oral Daily  . enoxaparin (LOVENOX) injection  40 mg Subcutaneous Q24H  . folic acid  1 mg Oral Daily  . furosemide  80 mg Oral BID  . lip balm  1 application Topical BID  . mouth rinse  15 mL Mouth Rinse BID  . methocarbamol  750 mg Oral QID  . metolazone  5 mg Oral Daily  . polyethylene glycol  17 g Oral BID  . psyllium  2 packet Oral BID  . senna-docusate  1 tablet Oral BID  . sodium chloride flush  3 mL Intravenous Q12H  . spironolactone  12.5  mg Oral Daily   Continuous Infusions: . sodium chloride     PRN Meds: sodium chloride, alum & mag hydroxide-simeth, bisacodyl, diphenhydrAMINE **OR** diphenhydrAMINE, enalaprilat, hydrALAZINE, hydrALAZINE, HYDROmorphone (DILAUDID) injection, magic mouthwash, menthol-cetylpyridinium, ondansetron **OR** ondansetron (ZOFRAN) IV, oxyCODONE, phenol, polyethylene glycol, prochlorperazine, simethicone, sodium chloride flush   Vital Signs    Vitals:   10/06/16 1755 10/06/16 2005 10/07/16 0407 10/07/16 0505  BP: (!) 90/41 (!) 90/40  (!) 106/48  Pulse: 63 64  70  Resp:  16  16  Temp:  98.3 F (36.8 C)  98.5 F (36.9 C)  TempSrc:  Oral  Oral  SpO2:  96%  97%  Weight:   125 lb 12.8 oz (57.1 kg)   Height:        Intake/Output Summary (Last 24 hours) at 10/07/16 0936 Last data filed at 10/07/16 0918  Gross per 24 hour  Intake              820 ml  Output              900 ml  Net              -80 ml   Filed Weights   10/05/16 0655 10/06/16 0406 10/07/16 0407  Weight: 126 lb 9.6 oz (57.4 kg) 126 lb 9.6 oz (  57.4 kg) 125 lb 12.8 oz (57.1 kg)    Physical Exam   GEN:No acute distress.   Cardiac:RRR, no murmurs, rubs, or gallops. 1+ pitting edema Respiratory:Clear to auscultation bilaterally. ZO:XWRU, nontender, non-distended . Some swelling at left hernia site, but no erythema or warmth.  Labs    CBC  Recent Labs  10/05/16 0415 10/06/16 0406  WBC 9.3 9.0  HGB 8.6* 9.5*  HCT 26.3* 29.0*  MCV 82.2 82.9  PLT 220 189   Basic Metabolic Panel  Recent Labs  10/05/16 0415  NA 132*  K 3.8  CL 96*  CO2 27  GLUCOSE 106*  BUN 19  CREATININE 0.62  CALCIUM 8.4*     Telemetry    NSR with PVCs- Personally Reviewed    No results found.  Cardiac Studies   Echo: 03/31/17 LV EF: 20% - 25% Study Conclusions - Left ventricle: The cavity size was mildly dilated. Wall thickness was normal. Systolic function was severely reduced. The estimated ejection fraction was  in the range of 20% to 25%. Severe diffuse hypokinesis. Features are consistent with a pseudonormal left ventricular filling pattern, with concomitant abnormal relaxation and increased filling pressure (grade 2 diastolic dysfunction). No evidence of thrombus. - Aortic valve: There was moderate regurgitation. - Mitral valve: There was moderate regurgitation. - Left atrium: The atrium was severely dilated. - Right ventricle: The cavity size was moderately dilated. Systolic function was moderately reduced. - Right atrium: The atrium was severely dilated. - Tricuspid valve: There was moderate regurgitation. - Pulmonary arteries: Systolic pressure was mildly to moderately increased. PA peak pressure: 47 mm Hg (S). - Pericardium, extracardiac: A trivial pericardial effusion was identified.  Patient Profile     Sheri Dunn is a 66 y.o. female with a history of severe ischemic cardiomyopathy, chronic systolic and diastolic biventricular HF, ascites with seroma, CAD s/p NSTEMI and DES to RCA and LCX 09/2014, s/p ICD November 2016 (Medtronic Visia AF), PAFlutter, AI, HLD, who presented [facility] 09/28[facility]16 for elective hernia repair. Cardiology consulted for perioperative hypotension and transient ECG changes. She was found to be volume overloaded and required IV diuresis.   Assessment & Plan    1. Acute on Chronic combined systolic and diastolic heart failure with peri-operative hypotension: Echo in oct 2017 shows EF 20-25% with moderate aortic and mitral, and tricuspic insufficiency and PA pressure of 47. She received a medtronic AF defibrillator in Nov 2016. In the past, she continued to have some problems with hypervolemia. During this hospitalization, she had sudden volume change decompensation after the drainage of ascites and was hypotensive likely because of baseline low cardiac function and sedating medications. She is net negative 4 L since admission.  Per graph, pt's baseline  weight seems to be 125-130, and today she is 125 lbs- close to her dry weight.  -discharge today pending result of the BMET  -follow up in cardiology office  -continue lasix 80 mg BID- goal weight is 120 lbs  -metolazone daily  -continue coreg and aldactone   2. Anemia: HgB has been stable.   3: surgical site seroma: Pt's pain is better  -surgery was consulted and patient is stable for discharge from their stand point    Signed, Deneise Lever, MD  10/07/2016, 9:36 AM

## 2016-10-07 NOTE — Progress Notes (Signed)
   Pt has been having issues with hypotension with SBP in the 80s. Pt lightheaded but no dizziness, syncope/ near syncope. Coreg was reduced down to 6.25 mg BID earlier today. RN paged stating she checked a manual BP after pt ate lunch and pressure was still low at 70/40. I've notified Dr. Duke Salviaandolph. We will give her IVFs and monitor overnight. Monitor volume status. D/c in the am.

## 2016-10-08 NOTE — Care Management Note (Signed)
Case Management Note  Patient Details  Name: Sheri Dunn MRN: [id number] Date of Birth: [birth year]  Subjective/Objective: CM spoke with Morrie SheldonAshley, bedside RN  To confirm pt has no needs prior to discharge. Pt has been ORA for past several days and does NOT need home O2 as was previously suspected.                  Action/Plan:CM will sign off for now but will be available should additional discharge needs arise or disposition change.    Expected Discharge Date:  10/07/16               Expected Discharge Plan:  Home/Self Care  In-House Referral:     Discharge planning Services  CM Consult  Post Acute Care Choice:  NA Choice offered to:  Patient  DME Arranged:    DME Agency:     HH Arranged:    HH Agency:     Status of Service:  Completed, signed off  If discussed at [employer] Length of Stay Meetings, dates discussed:    Additional Comments:  Yvone NeuCrutchfield, Cherylann Hobday M, RN 10/08/2016, 8:47 AM

## 2016-10-08 NOTE — Progress Notes (Signed)
Patient Name: Sheri Dunn Date of Encounter: 10/08/2016  Primary Cardiologist: Dr. [facility] Problem List     Principal Problem:   Ischemic cardiomyopathy Active Problems:   Essential hypertension   Elevated troponin   CAD S/P PCI April 2016   Aortic insufficiency   Chronic combined systolic and diastolic CHF (congestive heart failure) (HCC)   ICD (implantable cardioverter-defibrillator) in place   Hypertensive heart disease   Bilateral inguinal hernias s/p lap repair with mesh 09/28/2016   Obturator hernias, bilateral, s/p lap repair with mesh 09/28/2016   Umbilical hernia s/p lap repair with mesh 09/28/2016   Constipation, chronic   Cirrhosis, nonalcoholic   Poorly controlled ascites     Subjective   Feeling much better.  No longer lightheaded upon standing.  Ready to go home    Inpatient Medications    Scheduled Meds: . acetaminophen  1,000 mg Oral TID  . aspirin  81 mg Oral Daily  . atorvastatin  20 mg Oral q1800  . carvedilol  6.25 mg Oral BID WC  . cholecalciferol  2,000 Units Oral Daily  . enoxaparin (LOVENOX) injection  40 mg Subcutaneous Q24H  . folic acid  1 mg Oral Daily  . furosemide  80 mg Oral BID  . lip balm  1 application Topical BID  . mouth rinse  15 mL Mouth Rinse BID  . methocarbamol  750 mg Oral QID  . metolazone  5 mg Oral Daily  . polyethylene glycol  17 g Oral BID  . psyllium  2 packet Oral BID  . senna-docusate  1 tablet Oral BID  . sodium chloride flush  3 mL Intravenous Q12H  . spironolactone  12.5 mg Oral Daily   Continuous Infusions: . sodium chloride     PRN Meds: sodium chloride, alum & mag hydroxide-simeth, bisacodyl, diphenhydrAMINE **OR** diphenhydrAMINE, enalaprilat, hydrALAZINE, hydrALAZINE, HYDROmorphone (DILAUDID) injection, magic mouthwash, menthol-cetylpyridinium, ondansetron **OR** ondansetron (ZOFRAN) IV, oxyCODONE, phenol, polyethylene glycol, prochlorperazine, simethicone, sodium chloride flush   Vital  Signs    Vitals:   10/07/16 1848 10/07/16 2256 10/08/16 0347 10/08/16 0837  BP: (!) 89/41 (!) 101/43 100/73 (!) 114/51  Pulse: 74 74 76 73  Resp:  18 17   Temp:  98.5 F (36.9 C) 97.9 F (36.6 C)   TempSrc:  Oral Oral   SpO2:  97% 98%   Weight:   56.5 kg (124 lb 8 oz)   Height:        Intake/Output Summary (Last 24 hours) at 10/08/16 1007 Last data filed at 10/08/16 0800  Gross per 24 hour  Intake              970 ml  Output             2975 ml  Net            -2005 ml   Filed Weights   10/06/16 0406 10/07/16 0407 10/08/16 0347  Weight: 57.4 kg (126 lb 9.6 oz) 57.1 kg (125 lb 12.8 oz) 56.5 kg (124 lb 8 oz)    Physical Exam   GEN:No acute distress.   HEENT: [place]/AT.   Neck: No JVD.  Prominent EJ. Cardiac:RRR, no murmurs, rubs, or gallops.  Respiratory:Clear to auscultation bilaterally. ZO:XWRU, nontender, non-distended . Some swelling at left hernia site, but no erythema or warmth. Ext: WWP.  No edema.  2+ DP/PT pulses bilaterally Neuro: Non-focal.  Moves all extremities Psych: Pleasant affect  Labs    CBC  Recent Labs  10/06/16 0406  WBC 9.0  HGB 9.5*  HCT 29.0*  MCV 82.9  PLT 189   Basic Metabolic Panel  Recent Labs  10/07/16 1047  NA 127*  K 3.4*  CL 92*  CO2 26  GLUCOSE 154*  BUN 23*  CREATININE 0.76  CALCIUM 8.3*     Telemetry    NSR with PVCs- Personally Reviewed  Cardiac Studies   Echo: 03/31/17 LV EF: 20% - 25% Study Conclusions - Left ventricle: The cavity size was mildly dilated. Wall thickness was normal. Systolic function was severely reduced. The estimated ejection fraction was in the range of 20% to 25%. Severe diffuse hypokinesis. Features are consistent with a pseudonormal left ventricular filling pattern, with concomitant abnormal relaxation and increased filling pressure (grade 2 diastolic dysfunction). No evidence of thrombus. - Aortic valve: There was moderate regurgitation. - Mitral valve: There  was moderate regurgitation. - Left atrium: The atrium was severely dilated. - Right ventricle: The cavity size was moderately dilated. Systolic function was moderately reduced. - Right atrium: The atrium was severely dilated. - Tricuspid valve: There was moderate regurgitation. - Pulmonary arteries: Systolic pressure was mildly to moderately increased. PA peak pressure: 47 mm Hg (S). - Pericardium, extracardiac: A trivial pericardial effusion was identified.  Patient Profile     Sheri Dunn is a 66 y.o. female with a history of severe ischemic cardiomyopathy, chronic systolic and diastolic biventricular HF, ascites with seroma, CAD s/p NSTEMI and DES to RCA and LCX 09/2014, s/p ICD November 2016 (Medtronic Visia AF), PAFlutter, AI, HLD, who [facility] for elective hernia repair. Cardiology consulted for perioperative hypotension and transient ECG changes. She was found to be volume overloaded and required IV diuresis.   Assessment & Plan    # Acute on Chronic combined systolic and diastolic heart failure with peri-operative hypotension: # s/p ICD: LVEF 20-25% with moderate aortic and mitral, and tricuspic insufficiency and PA pressure of 47.  Blood pressure remained low yesterday and she was orthostatic so carvedilol was reduced to 6.25 mg twice daily. She is feeling much better. Her weight is 124 pounds, which is her baseline. Continue with Lasix 80 mg by mouth twice a day, metolazone 5mg  daily and spironolactone.   # Anemia: HgB has been stable.   # surgical site seroma: Pain controlled.  OK to discharge per surgery.    Signed, Chilton Siiffany Paris, MD  10/08/2016, 10:07 AM

## 2016-10-10 ENCOUNTER — Other Ambulatory Visit: Payer: Self-pay | Admitting: *Deleted

## 2016-10-10 MED ORDER — CARVEDILOL 6.25 MG PO TABS: 6.2500 mg | ORAL_TABLET | Freq: Two times a day (BID) | ORAL | 5 refills | Status: DC

## 2016-10-11 ENCOUNTER — Emergency Department (HOSPITAL_COMMUNITY): Payer: Medicare HMO

## 2016-10-11 ENCOUNTER — Encounter (HOSPITAL_COMMUNITY): Admission: EM | Disposition: E | Payer: Self-pay | Source: Home / Self Care | Attending: Pulmonary Disease

## 2016-10-11 ENCOUNTER — Inpatient Hospital Stay (HOSPITAL_COMMUNITY): Payer: Medicare HMO

## 2016-10-11 ENCOUNTER — Inpatient Hospital Stay (HOSPITAL_COMMUNITY)
Admission: EM | Admit: 2016-10-11 | Discharge: 2016-11-03 | DRG: 871 | Disposition: E | Payer: Medicare HMO | Attending: Pulmonary Disease | Admitting: Pulmonary Disease

## 2016-10-11 ENCOUNTER — Telehealth: Payer: Self-pay | Admitting: Cardiovascular Disease

## 2016-10-11 ENCOUNTER — Encounter (HOSPITAL_COMMUNITY): Payer: Self-pay | Admitting: *Deleted

## 2016-10-11 ENCOUNTER — Other Ambulatory Visit: Payer: Self-pay | Admitting: *Deleted

## 2016-10-11 DIAGNOSIS — Z95810 Presence of automatic (implantable) cardiac defibrillator: Secondary | ICD-10-CM | POA: Diagnosis not present

## 2016-10-11 DIAGNOSIS — Z7982 Long term (current) use of aspirin: Secondary | ICD-10-CM | POA: Diagnosis not present

## 2016-10-11 DIAGNOSIS — R6521 Severe sepsis with septic shock: Secondary | ICD-10-CM | POA: Diagnosis not present

## 2016-10-11 DIAGNOSIS — I4892 Unspecified atrial flutter: Secondary | ICD-10-CM | POA: Diagnosis present

## 2016-10-11 DIAGNOSIS — J810 Acute pulmonary edema: Secondary | ICD-10-CM

## 2016-10-11 DIAGNOSIS — K7460 Unspecified cirrhosis of liver: Secondary | ICD-10-CM | POA: Diagnosis present

## 2016-10-11 DIAGNOSIS — E440 Moderate protein-calorie malnutrition: Secondary | ICD-10-CM | POA: Diagnosis present

## 2016-10-11 DIAGNOSIS — I361 Nonrheumatic tricuspid (valve) insufficiency: Secondary | ICD-10-CM | POA: Diagnosis not present

## 2016-10-11 DIAGNOSIS — I255 Ischemic cardiomyopathy: Secondary | ICD-10-CM | POA: Diagnosis present

## 2016-10-11 DIAGNOSIS — I213 ST elevation (STEMI) myocardial infarction of unspecified site: Secondary | ICD-10-CM | POA: Diagnosis not present

## 2016-10-11 DIAGNOSIS — I959 Hypotension, unspecified: Secondary | ICD-10-CM | POA: Diagnosis not present

## 2016-10-11 DIAGNOSIS — R748 Abnormal levels of other serum enzymes: Secondary | ICD-10-CM | POA: Diagnosis not present

## 2016-10-11 DIAGNOSIS — R109 Unspecified abdominal pain: Secondary | ICD-10-CM

## 2016-10-11 DIAGNOSIS — K5900 Constipation, unspecified: Secondary | ICD-10-CM | POA: Diagnosis present

## 2016-10-11 DIAGNOSIS — I5042 Chronic combined systolic (congestive) and diastolic (congestive) heart failure: Secondary | ICD-10-CM | POA: Diagnosis present

## 2016-10-11 DIAGNOSIS — I110 Hypertensive heart disease with heart failure: Secondary | ICD-10-CM | POA: Diagnosis present

## 2016-10-11 DIAGNOSIS — Z8249 Family history of ischemic heart disease and other diseases of the circulatory system: Secondary | ICD-10-CM

## 2016-10-11 DIAGNOSIS — I2510 Atherosclerotic heart disease of native coronary artery without angina pectoris: Secondary | ICD-10-CM | POA: Diagnosis present

## 2016-10-11 DIAGNOSIS — J9600 Acute respiratory failure, unspecified whether with hypoxia or hypercapnia: Secondary | ICD-10-CM | POA: Diagnosis not present

## 2016-10-11 DIAGNOSIS — T792XXD Traumatic secondary and recurrent hemorrhage and seroma, subsequent encounter: Secondary | ICD-10-CM | POA: Diagnosis not present

## 2016-10-11 DIAGNOSIS — R4182 Altered mental status, unspecified: Secondary | ICD-10-CM | POA: Diagnosis not present

## 2016-10-11 DIAGNOSIS — I351 Nonrheumatic aortic (valve) insufficiency: Secondary | ICD-10-CM | POA: Diagnosis present

## 2016-10-11 DIAGNOSIS — I950 Idiopathic hypotension: Secondary | ICD-10-CM | POA: Diagnosis not present

## 2016-10-11 DIAGNOSIS — I083 Combined rheumatic disorders of mitral, aortic and tricuspid valves: Secondary | ICD-10-CM | POA: Diagnosis present

## 2016-10-11 DIAGNOSIS — Z833 Family history of diabetes mellitus: Secondary | ICD-10-CM

## 2016-10-11 DIAGNOSIS — R0600 Dyspnea, unspecified: Secondary | ICD-10-CM

## 2016-10-11 DIAGNOSIS — J9811 Atelectasis: Secondary | ICD-10-CM | POA: Diagnosis not present

## 2016-10-11 DIAGNOSIS — Z9861 Coronary angioplasty status: Secondary | ICD-10-CM

## 2016-10-11 DIAGNOSIS — Z452 Encounter for adjustment and management of vascular access device: Secondary | ICD-10-CM

## 2016-10-11 DIAGNOSIS — D62 Acute posthemorrhagic anemia: Secondary | ICD-10-CM | POA: Diagnosis not present

## 2016-10-11 DIAGNOSIS — A4151 Sepsis due to Escherichia coli [E. coli]: Secondary | ICD-10-CM | POA: Diagnosis not present

## 2016-10-11 DIAGNOSIS — I5043 Acute on chronic combined systolic (congestive) and diastolic (congestive) heart failure: Secondary | ICD-10-CM | POA: Diagnosis not present

## 2016-10-11 DIAGNOSIS — Z823 Family history of stroke: Secondary | ICD-10-CM | POA: Diagnosis not present

## 2016-10-11 DIAGNOSIS — L89152 Pressure ulcer of sacral region, stage 2: Secondary | ICD-10-CM | POA: Diagnosis present

## 2016-10-11 DIAGNOSIS — K458 Other specified abdominal hernia without obstruction or gangrene: Secondary | ICD-10-CM | POA: Diagnosis present

## 2016-10-11 DIAGNOSIS — E871 Hypo-osmolality and hyponatremia: Secondary | ICD-10-CM | POA: Diagnosis not present

## 2016-10-11 DIAGNOSIS — S301XXA Contusion of abdominal wall, initial encounter: Secondary | ICD-10-CM

## 2016-10-11 DIAGNOSIS — I222 Subsequent non-ST elevation (NSTEMI) myocardial infarction: Secondary | ICD-10-CM | POA: Diagnosis not present

## 2016-10-11 DIAGNOSIS — I480 Paroxysmal atrial fibrillation: Secondary | ICD-10-CM | POA: Diagnosis present

## 2016-10-11 DIAGNOSIS — R34 Anuria and oliguria: Secondary | ICD-10-CM | POA: Diagnosis not present

## 2016-10-11 DIAGNOSIS — R188 Other ascites: Secondary | ICD-10-CM | POA: Diagnosis not present

## 2016-10-11 DIAGNOSIS — N170 Acute kidney failure with tubular necrosis: Secondary | ICD-10-CM | POA: Diagnosis not present

## 2016-10-11 DIAGNOSIS — I484 Atypical atrial flutter: Secondary | ICD-10-CM | POA: Diagnosis not present

## 2016-10-11 DIAGNOSIS — K5909 Other constipation: Secondary | ICD-10-CM | POA: Diagnosis present

## 2016-10-11 DIAGNOSIS — E785 Hyperlipidemia, unspecified: Secondary | ICD-10-CM | POA: Diagnosis present

## 2016-10-11 DIAGNOSIS — A419 Sepsis, unspecified organism: Secondary | ICD-10-CM

## 2016-10-11 DIAGNOSIS — J9690 Respiratory failure, unspecified, unspecified whether with hypoxia or hypercapnia: Secondary | ICD-10-CM

## 2016-10-11 DIAGNOSIS — K429 Umbilical hernia without obstruction or gangrene: Secondary | ICD-10-CM | POA: Diagnosis present

## 2016-10-11 DIAGNOSIS — J9601 Acute respiratory failure with hypoxia: Secondary | ICD-10-CM | POA: Diagnosis not present

## 2016-10-11 DIAGNOSIS — R570 Cardiogenic shock: Secondary | ICD-10-CM | POA: Diagnosis present

## 2016-10-11 DIAGNOSIS — Z515 Encounter for palliative care: Secondary | ICD-10-CM | POA: Diagnosis not present

## 2016-10-11 DIAGNOSIS — N179 Acute kidney failure, unspecified: Secondary | ICD-10-CM | POA: Diagnosis not present

## 2016-10-11 DIAGNOSIS — T888XXD Other specified complications of surgical and medical care, not elsewhere classified, subsequent encounter: Secondary | ICD-10-CM | POA: Diagnosis not present

## 2016-10-11 DIAGNOSIS — R7989 Other specified abnormal findings of blood chemistry: Secondary | ICD-10-CM

## 2016-10-11 DIAGNOSIS — I2119 ST elevation (STEMI) myocardial infarction involving other coronary artery of inferior wall: Secondary | ICD-10-CM | POA: Diagnosis not present

## 2016-10-11 DIAGNOSIS — R0602 Shortness of breath: Secondary | ICD-10-CM | POA: Diagnosis not present

## 2016-10-11 DIAGNOSIS — Z7189 Other specified counseling: Secondary | ICD-10-CM

## 2016-10-11 DIAGNOSIS — I252 Old myocardial infarction: Secondary | ICD-10-CM

## 2016-10-11 DIAGNOSIS — I517 Cardiomegaly: Secondary | ICD-10-CM | POA: Diagnosis not present

## 2016-10-11 DIAGNOSIS — I119 Hypertensive heart disease without heart failure: Secondary | ICD-10-CM | POA: Diagnosis present

## 2016-10-11 DIAGNOSIS — E162 Hypoglycemia, unspecified: Secondary | ICD-10-CM | POA: Diagnosis present

## 2016-10-11 DIAGNOSIS — E872 Acidosis: Secondary | ICD-10-CM | POA: Diagnosis not present

## 2016-10-11 DIAGNOSIS — L7634 Postprocedural seroma of skin and subcutaneous tissue following other procedure: Secondary | ICD-10-CM | POA: Diagnosis present

## 2016-10-11 DIAGNOSIS — J90 Pleural effusion, not elsewhere classified: Secondary | ICD-10-CM | POA: Diagnosis not present

## 2016-10-11 DIAGNOSIS — L8990 Pressure ulcer of unspecified site, unspecified stage: Secondary | ICD-10-CM | POA: Insufficient documentation

## 2016-10-11 DIAGNOSIS — Z66 Do not resuscitate: Secondary | ICD-10-CM | POA: Diagnosis not present

## 2016-10-11 DIAGNOSIS — K4020 Bilateral inguinal hernia, without obstruction or gangrene, not specified as recurrent: Secondary | ICD-10-CM | POA: Diagnosis present

## 2016-10-11 DIAGNOSIS — Z6820 Body mass index (BMI) 20.0-20.9, adult: Secondary | ICD-10-CM

## 2016-10-11 DIAGNOSIS — R778 Other specified abnormalities of plasma proteins: Secondary | ICD-10-CM | POA: Diagnosis present

## 2016-10-11 LAB — CBC WITH DIFFERENTIAL/PLATELET
BASOS ABS: 0 10*3/uL (ref 0.0–0.1)
BASOS PCT: 0 %
Band Neutrophils: 40 %
Blasts: 0 %
Eosinophils Absolute: 0 10*3/uL (ref 0.0–0.7)
Eosinophils Relative: 0 %
HCT: 29 % — ABNORMAL LOW (ref 36.0–46.0)
Hemoglobin: 9.8 g/dL — ABNORMAL LOW (ref 12.0–15.0)
Lymphocytes Relative: 21 %
Lymphs Abs: 0.8 10*3/uL (ref 0.7–4.0)
MCH: 27.6 pg (ref 26.0–34.0)
MCHC: 33.8 g/dL (ref 30.0–36.0)
MCV: 81.7 fL (ref 78.0–100.0)
MONO ABS: 0.4 10*3/uL (ref 0.1–1.0)
MYELOCYTES: 0 %
Metamyelocytes Relative: 5 %
Monocytes Relative: 9 %
NEUTROS PCT: 23 %
NRBC: 1 /100{WBCs} — AB
Neutro Abs: 2.7 10*3/uL (ref 1.7–7.7)
Other: 2 %
PLATELETS: 311 10*3/uL (ref 150–400)
PROMYELOCYTES ABS: 0 %
RBC: 3.55 MIL/uL — AB (ref 3.87–5.11)
RDW: 17.3 % — ABNORMAL HIGH (ref 11.5–15.5)
WBC MORPHOLOGY: INCREASED
WBC: 3.9 10*3/uL — AB (ref 4.0–10.5)

## 2016-10-11 LAB — COMPREHENSIVE METABOLIC PANEL
ALT: 14 U/L (ref 14–54)
ANION GAP: 17 — AB (ref 5–15)
AST: 31 U/L (ref 15–41)
Albumin: 2.5 g/dL — ABNORMAL LOW (ref 3.5–5.0)
Alkaline Phosphatase: 62 U/L (ref 38–126)
BUN: 53 mg/dL — ABNORMAL HIGH (ref 6–20)
CHLORIDE: 98 mmol/L — AB (ref 101–111)
CO2: 17 mmol/L — ABNORMAL LOW (ref 22–32)
Calcium: 8 mg/dL — ABNORMAL LOW (ref 8.9–10.3)
Creatinine, Ser: 2.63 mg/dL — ABNORMAL HIGH (ref 0.44–1.00)
GFR, EST AFRICAN AMERICAN: 21 mL/min — AB (ref >60)
GFR, EST NON AFRICAN AMERICAN: 18 mL/min — AB (ref >60)
Glucose, Bld: 43 mg/dL — CL (ref 65–99)
POTASSIUM: 4.6 mmol/L (ref 3.5–5.1)
Sodium: 132 mmol/L — ABNORMAL LOW (ref 135–145)
Total Bilirubin: 2.4 mg/dL — ABNORMAL HIGH (ref 0.3–1.2)
Total Protein: 5.8 g/dL — ABNORMAL LOW (ref 6.5–8.1)

## 2016-10-11 LAB — COOXEMETRY PANEL
Carboxyhemoglobin: 2 % — ABNORMAL HIGH (ref 0.5–1.5)
METHEMOGLOBIN: 0.9 % (ref 0.0–1.5)
O2 SAT: 64.1 %
TOTAL HEMOGLOBIN: 9.2 g/dL — AB (ref 12.0–16.0)

## 2016-10-11 LAB — GLUCOSE, CAPILLARY
Glucose-Capillary: 132 mg/dL — ABNORMAL HIGH (ref 65–99)
Glucose-Capillary: 179 mg/dL — ABNORMAL HIGH (ref 65–99)

## 2016-10-11 LAB — I-STAT CG4 LACTIC ACID, ED: LACTIC ACID, VENOUS: 5.27 mmol/L — AB (ref 0.5–1.9)

## 2016-10-11 LAB — PROTIME-INR
INR: 1.8
PROTHROMBIN TIME: 21.2 s — AB (ref 11.4–15.2)

## 2016-10-11 LAB — LIPID PANEL
CHOL/HDL RATIO: 3.7 ratio
Cholesterol: 41 mg/dL (ref 0–200)
HDL: 11 mg/dL — AB (ref >40)
LDL Cholesterol: 13 mg/dL (ref 0–99)
TRIGLYCERIDES: 83 mg/dL (ref <150)
VLDL: 17 mg/dL (ref 0–40)

## 2016-10-11 LAB — PROCALCITONIN: Procalcitonin: 37.31 ng/mL

## 2016-10-11 LAB — TROPONIN I
TROPONIN I: 0.43 ng/mL — AB (ref <0.03)
Troponin I: 0.46 ng/mL (ref <0.03)

## 2016-10-11 LAB — I-STAT TROPONIN, ED: TROPONIN I, POC: 0.88 ng/mL — AB (ref 0.00–0.08)

## 2016-10-11 LAB — AMMONIA: Ammonia: 34 umol/L (ref 9–35)

## 2016-10-11 LAB — LACTIC ACID, PLASMA
LACTIC ACID, VENOUS: 3 mmol/L — AB (ref 0.5–1.9)
LACTIC ACID, VENOUS: 2.7 mmol/L — AB (ref 0.5–1.9)

## 2016-10-11 LAB — CBG MONITORING, ED: Glucose-Capillary: 119 mg/dL — ABNORMAL HIGH (ref 65–99)

## 2016-10-11 LAB — APTT: APTT: 91 s — AB (ref 24–36)

## 2016-10-11 SURGERY — INVASIVE LAB ABORTED CASE

## 2016-10-11 MED ORDER — VANCOMYCIN HCL IN DEXTROSE 750-5 MG/150ML-% IV SOLN: 750.0000 mg | INTRAVENOUS | Status: DC

## 2016-10-11 MED ORDER — ASPIRIN 300 MG RE SUPP
300.0000 mg | Freq: Once | RECTAL | Status: AC
  Administered 2016-10-11: 300 mg via RECTAL
  Filled 2016-10-11: qty 1

## 2016-10-11 MED ORDER — PROTAMINE SULFATE 10 MG/ML IV SOLN
30.0000 mg | INTRAVENOUS | Status: DC
  Filled 2016-10-11: qty 5

## 2016-10-11 MED ORDER — SODIUM CHLORIDE 0.9 % IV SOLN
Freq: Once | INTRAVENOUS | Status: AC
  Administered 2016-10-11: 12:00:00 via INTRAVENOUS

## 2016-10-11 MED ORDER — MILRINONE LACTATE IN DEXTROSE 20-5 MG/100ML-% IV SOLN: 0.2500 ug/kg/min | INTRAVENOUS | Status: DC

## 2016-10-11 MED ORDER — PIPERACILLIN-TAZOBACTAM IN DEX 2-0.25 GM/50ML IV SOLN
2.2500 g | Freq: Three times a day (TID) | INTRAVENOUS | Status: DC
  Administered 2016-10-11 – 2016-10-12 (×3): 2.25 g via INTRAVENOUS
  Filled 2016-10-11 (×4): qty 50

## 2016-10-11 MED ORDER — HEPARIN SODIUM (PORCINE) 5000 UNIT/ML IJ SOLN: 3000.0000 [IU] | Freq: Once | INTRAMUSCULAR | Status: DC

## 2016-10-11 MED ORDER — DEXTROSE-NACL 5-0.45 % IV SOLN
INTRAVENOUS | Status: DC
  Administered 2016-10-11: 75 mL/h via INTRAVENOUS

## 2016-10-11 MED ORDER — DEXTROSE 50 % IV SOLN
50.0000 mL | Freq: Once | INTRAVENOUS | Status: AC
  Administered 2016-10-11: 50 mL via INTRAVENOUS
  Filled 2016-10-11: qty 50

## 2016-10-11 MED ORDER — ASPIRIN 81 MG PO CHEW: 324.0000 mg | CHEWABLE_TABLET | Freq: Once | ORAL | Status: DC

## 2016-10-11 MED ORDER — IOPAMIDOL (ISOVUE-300) INJECTION 61%
INTRAVENOUS | Status: AC
  Filled 2016-10-11: qty 100

## 2016-10-11 MED ORDER — HEPARIN SODIUM (PORCINE) 5000 UNIT/ML IJ SOLN
INTRAMUSCULAR | Status: AC
  Administered 2016-10-11: 3000 [IU]
  Filled 2016-10-11: qty 1

## 2016-10-11 MED ORDER — NOREPINEPHRINE BITARTRATE 1 MG/ML IV SOLN
0.0000 ug/min | INTRAVENOUS | Status: DC
  Administered 2016-10-11: 5 ug/min via INTRAVENOUS
  Filled 2016-10-11: qty 4

## 2016-10-11 MED ORDER — CARVEDILOL 6.25 MG PO TABS: 6.2500 mg | ORAL_TABLET | Freq: Two times a day (BID) | ORAL | 5 refills | Status: AC

## 2016-10-11 MED ORDER — CLINDAMYCIN PHOSPHATE 600 MG/50ML IV SOLN
600.0000 mg | Freq: Three times a day (TID) | INTRAVENOUS | Status: DC
  Administered 2016-10-11 – 2016-10-12 (×3): 600 mg via INTRAVENOUS
  Filled 2016-10-11 (×3): qty 50

## 2016-10-11 MED ORDER — ASPIRIN 81 MG PO CHEW
243.0000 mg | CHEWABLE_TABLET | Freq: Once | ORAL | Status: DC
  Filled 2016-10-11: qty 3

## 2016-10-11 MED ORDER — SODIUM CHLORIDE 0.9 % IV SOLN: 0.0000 ug/min | INTRAVENOUS | Status: DC

## 2016-10-11 MED ORDER — VANCOMYCIN HCL 10 G IV SOLR
1250.0000 mg | Freq: Once | INTRAVENOUS | Status: AC
  Administered 2016-10-11: 1250 mg via INTRAVENOUS
  Filled 2016-10-11: qty 1250

## 2016-10-11 MED ORDER — SODIUM CHLORIDE 0.9 % IV SOLN: 250.0000 mL | INTRAVENOUS | Status: DC | PRN

## 2016-10-11 MED ORDER — DEXTROSE 10 % IV SOLN
INTRAVENOUS | Status: DC
  Administered 2016-10-11: 17:00:00 via INTRAVENOUS

## 2016-10-11 MED ORDER — MILRINONE LACTATE IN DEXTROSE 20-5 MG/100ML-% IV SOLN
0.2500 ug/kg/min | INTRAVENOUS | Status: DC
  Administered 2016-10-11 – 2016-10-14 (×5): 0.25 ug/kg/min via INTRAVENOUS
  Filled 2016-10-11 (×4): qty 100

## 2016-10-11 MED ORDER — SODIUM CHLORIDE 0.9 % IV SOLN
0.0300 [IU]/min | INTRAVENOUS | Status: DC
  Administered 2016-10-12 (×2): 0.03 [IU]/min via INTRAVENOUS
  Filled 2016-10-11 (×2): qty 2

## 2016-10-11 MED ORDER — DEXTROSE 5 % IV SOLN
0.0000 ug/min | INTRAVENOUS | Status: DC
  Administered 2016-10-11 (×2): 40 ug/min via INTRAVENOUS
  Administered 2016-10-11: 15 ug/min via INTRAVENOUS
  Administered 2016-10-12: 30 ug/min via INTRAVENOUS
  Filled 2016-10-11 (×5): qty 4

## 2016-10-11 SURGICAL SUPPLY — 8 items
GUIDEWIRE 3MM J TIP .035 145 (WIRE) ×3 IMPLANT
KIT ENCORE 26 ADVANTAGE (KITS) ×3 IMPLANT
KIT HEART LEFT (KITS) ×4 IMPLANT
KIT MICROINTRODUCER STIFF 5F (SHEATH) ×3 IMPLANT
PACK CARDIAC CATHETERIZATION (CUSTOM PROCEDURE TRAY) ×4 IMPLANT
SHEATH PINNACLE 6F 10CM (SHEATH) ×3 IMPLANT
TRANSDUCER W/STOPCOCK (MISCELLANEOUS) ×4 IMPLANT
TUBING CIL FLEX 10 FLL-RA (TUBING) ×4 IMPLANT

## 2016-10-11 NOTE — ED Notes (Signed)
Critical care Doctor at bedside.  

## 2016-10-11 NOTE — ED Notes (Signed)
Nurse returned from CT with patient.  

## 2016-10-11 NOTE — ED Notes (Signed)
2 family members at bedside praying with chaplin.

## 2016-10-11 NOTE — ED Notes (Signed)
Critical care Doctor and Surgeon speaking about patient and plan of care.

## 2016-10-11 NOTE — ED Triage Notes (Signed)
Pt and family reports pt had recent hernia repair, was dc home on Sunday. Pt woke up this am with vision loss, dizziness, confusion and generalized weakness.
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
## 2016-10-11 NOTE — ED Notes (Signed)
Elevated trop reported to Dr. Rush Landmarkegeler
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
## 2016-10-11 NOTE — ED Notes (Signed)
Patient continued have no complaints. Alert answering and following commands appropriate.

## 2016-10-11 NOTE — ED Notes (Signed)
Critical care at bedside  

## 2016-10-11 NOTE — ED Notes (Signed)
Critical care will contact cardiology if patient going to cath lab before ICU.

## 2016-10-11 NOTE — ED Notes (Signed)
Patient stated since having surgery she has had trouble with her blood pressure and blood sugar levels.

## 2016-10-11 NOTE — Procedures (Signed)
Central Venous Catheter Insertion Procedure Note Romona CurlsCarolyn Hunke [id number] 29-Jul-1950  Procedure: Insertion of Central Venous Catheter Indications: Assessment of intravascular volume and Drug and/or fluid administration  Procedure Details Consent: Risks of procedure as well as the alternatives and risks of each were explained to the (patient/caregiver).  Consent for procedure obtained. Time Out: Verified patient identification, verified procedure, site/side was marked, verified correct patient position, special equipment/implants available, medications/allergies/relevent history reviewed, required imaging and test results available.  Performed  Maximum sterile technique was used including antiseptics, cap, gloves, gown, hand hygiene, mask and sheet. Skin prep: Chlorhexidine; local anesthetic administered A antimicrobial bonded/coated triple lumen catheter was placed in the right internal jugular vein using the Seldinger technique.  Evaluation Blood flow good Complications: No apparent complications Patient did tolerate procedure well. Chest X-ray ordered to verify placement.  CXR: pending.  U/S used in placement.  YACOUB,WESAM 10/17/2016, 4:27 PM

## 2016-10-11 NOTE — ED Notes (Signed)
Lab called critical lab glucose 43 notified EDP.

## 2016-10-11 NOTE — Telephone Encounter (Signed)
Spoke with the patient's sister. She stated that the patient had recently been discharged from the hospital on Sunday 5/5. She had been admitted for hernia surgery and developed volume overload which required IV diuresis. The sister stated that the patient woke up today and was disoriented, shaky and had developed on and off loss of vision. She denied slurred speech, face droop, but did complain of generalized weakness. They are unable to get a blood pressure reading. The sister wanted to know if she should take her to the hospital. She was advised that she should go to the ED for further evaluation. The sister verbalized her understanding and stated that she would take her.

## 2016-10-11 NOTE — ED Notes (Signed)
Surgeon at bedside.  

## 2016-10-11 NOTE — Progress Notes (Signed)
eLink Physician-Brief Progress Note Patient Name: Sheri CurlsCarolyn Dunn DOB: 08-17-1950 MRN: [id number]   Date of Service  10/10/2016  HPI/Events of Note  65 recenty hernia repair h/o chf, mulyiple med issues stemi today shock  Line get cvp Levophed Camera care, awake, mild to no distress flat Ct reviewed hct follow up  eICU Interventions       Intervention Category Evaluation Type: New Patient Evaluation  Nelda BucksFEINSTEIN,Haig Gerardo J. 10/23/2016, 3:55 PM
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
## 2016-10-11 NOTE — ED Notes (Signed)
Son stated vision changes lasted approximately 30 minutes.

## 2016-10-11 NOTE — ED Notes (Signed)
Family at bedside. Stated last seen normal one day ago by daughter at [id number] and by son at [id number].  Patient lives by herself and son checked on patient again today at 0900. Patient was confused, could not see, and general weakness.

## 2016-10-11 NOTE — Progress Notes (Signed)
CRITICAL VALUE ALERT  Critical value received:  Lactic Acid 3.0   Date of notification:  10/27/2016  Time of notification:  1752  Critical value read back:Yes.    Nurse who received alert:  Ann MakiMegan Melroy Bougher  MD notified (1st page):  CCM Rhys MartiniFeinstein Elink  Time of first page:  [phone]  Responding MD:  CCM Elink

## 2016-10-11 NOTE — Telephone Encounter (Signed)
Yes, sounds like her blood pressure is probably very low. Should go to ED
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
## 2016-10-11 NOTE — ED Notes (Signed)
Patient continued to be on continued zoll monitoring and cardiac monitoring.

## 2016-10-11 NOTE — Progress Notes (Signed)
eLink Physician-Brief Progress Note Patient Name: Sheri Dunn DOB: 10/30/1950 MRN: [id number]   Date of Service  10/22/2016  HPI/Events of Note  Hypotensive.  eICU Interventions  Add vasopressin.     Intervention Category Major Interventions: Other:  Joia Doyle 10/31/2016, 11:27 PM

## 2016-10-11 NOTE — Progress Notes (Signed)
Responded to page to support patient who was scheduled for cath lab but procedure was delayed to due to new finding that involved additional test and observations.  Went to cath Lab waiting area to escort family back to ED trauma bay C. and later returned for visit.  Patient per nurse is doing better.  Family at bedside. Will follow as needed.  Venida JarvisWatlington, Hayle Parisi, Chaplain,pager [phone]

## 2016-10-11 NOTE — Progress Notes (Addendum)
Pharmacy Antibiotic Note  Sheri Dunn is a 66 y.o. female admitted on 10/04/2016 with sepsis.  Pharmacy has been consulted for vancomycin/zosyn dosing. Also started on Clinda per MD. Afebrile, WBC 3.9, LA 5.27. SCr 2.63 on admit, CrCl~18.  Plan: Zosyn 2.25g IV q8h Vancomycin 1250mg  IV x1; then 750mg  IV q48h Clinda 600mg  IV q8h per MD Monitor clinical progress, c/s, renal function F/u de-escalation plan/LOT, vancomycin trough as indicated   Weight: 125 lb (56.7 kg)  Temp (24hrs), Avg:98.8 F (37.1 C), Min:98.8 F (37.1 C), Max:98.8 F (37.1 C)   Recent Labs Lab 10/05/16 0415 10/06/16 0406 10/07/16 1047 10/21/2016 1208 11/02/2016 1237 10/29/2016 1325  WBC 9.3 9.0  --  3.9*  --   --   CREATININE 0.62  --  0.76  --  2.63*  --   LATICACIDVEN  --   --   --   --   --  5.27*    Estimated Creatinine Clearance: 18.6 mL/min (A) (by C-G formula based on SCr of 2.63 mg/dL (H)).    Allergies  Allergen Reactions  . No Known Allergies     Antimicrobials this admission: 5/9 vancomycin >>  5/9 zosyn >>  5/9 clinda >>  Dose adjustments this admission:   Microbiology results:   Sheri Dunn, PharmD, BCPS Clinical Pharmacist 10/19/2016 3:08 PM

## 2016-10-11 NOTE — ED Notes (Signed)
Spoke with Critical care will transport to ICU.

## 2016-10-11 NOTE — Progress Notes (Signed)
CRITICAL VALUE ALERT  Critical value received:  Lactic acid  Date of notification:  10/15/2016  Time of notification:  2015  Critical value read back:Yes.    Nurse who received alert:  Caryn BeeKevin, RN   MD notified (1st page):  E-Link MD   Time of first page: 2045

## 2016-10-11 NOTE — ED Notes (Signed)
Patient transported to cath lab. When patient was on cath table staff assessed groin area.  Sac RLQ and LLQ cancelled cath. Transported back to ED for evaluation.

## 2016-10-11 NOTE — Progress Notes (Signed)
[facility]  [address] [place], [place] [address] Phone: [phone] FAX: [phone]   Cady Hafen [id number] [birth year]    Problem List:   Principal Problem:   Acute ST elevation myocardial infarction (STEMI) of inferior wall [facility]) Active Problems:   Cirrhosis, nonalcoholic   Poorly controlled ascites   CAD S/P PCI April 2016   Chronic combined systolic and diastolic CHF (congestive heart failure) ([place])   ICD (implantable cardioverter-defibrillator) in place   Hypertensive heart disease   Bilateral inguinal hernias s/p lap repair with mesh 09/28/2016   STEMI (ST elevation myocardial infarction) ([place])   Day of Surgery  09/28/2016  POST-OPERATIVE DIAGNOSIS:    Bilateral inguinal hernias Bilateral obturator hernias Umbilical hernia LLQ seromas  PROCEDURE:    LAPAROSCOPIC BILATERAL INGUINAL HERNIA REPAIRS WITH MESH LAPAROSCOPIC BILATERAL OBTURATOR HERNIA REPAIRS WITH MESH LAPAROSCOPIC UMBILICAL HERNIA REPAIR WITH MESH Needle aspiration of seroma INSERTION OF MESH  SURGEON:  Adin Hector, MD    Assessment  GUARDED with shock, but no evidence of abdominal infection  Plan:  Appropriate postop pain s/p lap [place] & giant BIH repairs with mesh  NO EVIDENCE OF PERITONITIS at this time.  Same abd soreness (mild) as noted last week.    Seromas/hematomas in preperitoneal space expected.  Doubt infection.  PLEASE DO NOT SAMPLE FLUID unless cardiac shock & MI treated & still having issues.  Increases risk of mesh infection & ascites leak markedly  Large seromas at LLQ & R inguinal region at site of former hernias expected in pt with massive ascites.  Hernia sacs filled up w ascites fluid.  Not incarcerated.  Not infected.  Soft.  Stable.  Have not been redicible since POD#6.  Should stabilize & reduce over the next few months, especially if ascites can be better controlled.  Would not tap the areas as increases the  risk a mesh infection which would be a major problem.  Patient & family feel reassured.  D/w Dr Marlou Starks.  D/w Dr Nelda Marseille with CCM  I would not do IV ABx for surgical needs but understand starting for shock protocol & regrouping.  -bowel regimen for constipation.  Miralax   -Aggressive diuresis  -TED hose for BLE - defer to cardiology   Adin Hector, M.D., F.A.C.S. Gastrointestinal and Minimally Invasive Surgery [facility] [address] [place], [place] [address] [phone] Main / Paging   10/04/2016  CARE TEAM:  PCP: [name], MD  Outpatient Care Team: Patient Care Team: [name], MD as PCP - General (Internal Medicine) Croitoru, Dani Gobble, MD as Consulting Physician (Cardiology) Michael Boston, MD as Consulting Physician (General Surgery)  Inpatient Treatment Team: Treatment Team: Attending Provider: Rush Farmer, MD; Technician: Wille Glaser, EMT; Consulting Physician: Nolon Nations, MD; Consulting Physician: Michael Boston, MD; Rounding Team: Pccm, Md, MD; Registered Nurse: Esperanza Sheets, RN  Subjective:  Patient awoke this morning feeling quite weak.  Could not see.  Arm weakness.  [place]ner.  Brought to emergency room.  Elevated troponins.  Code MI called.  Cardiology concerned about large abdominal fluid collections.  Catheterization held off.  Requested STAT surgical evaluation.  Already seen x 2 at bedside by surgery. Family and nursing at bedside.  Critical care bedside.    Patient's had some mild soreness around her bellybutton and right groin but no change from last week.  She did not complain of pain in abdomen/groin at home nor on ride in here.  Family notes she has not been complaining of abdominal pain.  No bloating nor crampiness.  No change in size of abdomen or large BLQ hernia sacs.  Appetite has been pretty good.  She has not had any nausea or vomiting.  She struggles with intermittent constipation.  Had one day of  some loose stools and diarrhea but no problem in the past 48 hours.  Appetite is been okay.  No drainage from her incisions.  No rectal bleeding.  No hematemesis.   Objective:  Vital signs:  Vitals:   10/12/2016 1450 11/02/2016 1455 10/10/2016 1500 10/29/2016 1505  BP: (!) 97/49 (!) 88/52 (!) 97/53 (!) 94/46  Pulse: (!) 101 100 (!) 101 (!) 102  Resp: (!) 26 (!) 26 (!) 30 (!) 28  Temp:      TempSrc:      SpO2: 100% 100% 99% 99%  Weight:           Intake/Output   Yesterday:  No intake/output data recorded. This shift:  Total I/O In: 1000 [I.V.:1000] Out: -   Bowel function:  Flatus: YES  BM:  YES  Drain: (No drain)   Physical Exam:  General: Pt awake/alert/oriented x4 in no acute distress.  "Well, HEY DR Anisah Kuck!"  Talking away calmly Eyes: PERRL, normal EOM.  Sclera clear.  No icterus Neuro: CN II-XII intact w/o focal sensory/motor deficits. Lymph: No head/neck/groin lymphadenopathy Psych:  No delerium/psychosis/paranoia.  Calm.  Not confused.  Chatty HENT: Normocephalic, Mucus membranes moist.  No thrush.  ETT in place Neck: Supple, No tracheal deviation Chest: Dec BS at bases.  No chest wall pain w good excursion CV:  Pulses intact.  Regular rhythm MS: Normal AROM mjr joints.  No obvious deformity  Abdomen: Soft.  Moderately distended. Chronic swelling consistent with chronic ascites  Mild soreness periumbilical & groins.  Mod pannicular edema.  Incisions c/d/I.  DRY.  UNCHANGED  Old hernia sacs UNCHANGED.  R groin smooth & large 8x7x7cm.  Soft.  No fluctuance/warmth.  LLQ SQ seroma fluid 12x4cm soft & uinchanged  No pain/fluctuance.  No evidence of peritonitis.    Ext:  Pitting edema to knees.  No cyanosis Skin: No petechiae / purpura  Results:   Labs: Results for orders placed or performed during the hospital encounter of 10/04/2016 (from the past 48 hour(s))  I-stat troponin, ED     Status: Abnormal   Collection Time: 10/10/2016 12:04 PM  Result Value Ref Range    Troponin i, poc 0.88 (HH) 0.00 - 0.08 ng/mL   Comment NOTIFIED PHYSICIAN    Comment 3            Comment: Due to the release kinetics of cTnI, a negative result within the first hours of the onset of symptoms does not rule out myocardial infarction with certainty. If myocardial infarction is still suspected, repeat the test at appropriate intervals.   CBC with Differential/Platelet     Status: Abnormal   Collection Time: 10/30/2016 12:08 PM  Result Value Ref Range   WBC 3.9 (L) 4.0 - 10.5 K/uL    Comment: REPEATED TO VERIFY   RBC 3.55 (L) 3.87 - 5.11 MIL/uL   Hemoglobin 9.8 (L) 12.0 - 15.0 g/dL    Comment: REPEATED TO VERIFY   HCT 29.0 (L) 36.0 - 46.0 %   MCV 81.7 78.0 - 100.0 fL   MCH 27.6 26.0 - 34.0 pg   MCHC 33.8 30.0 - 36.0 g/dL   RDW 17.3 (H) 11.5 - 15.5 %  Platelets 311 150 - 400 K/uL   Neutrophils Relative % 23 %   Lymphocytes Relative 21 %   Monocytes Relative 9 %   Eosinophils Relative 0 %   Basophils Relative 0 %   Band Neutrophils 40 %   Metamyelocytes Relative 5 %   Myelocytes 0 %   Promyelocytes Absolute 0 %   Blasts 0 %   nRBC 1 (H) 0 /100 WBC   Other 2 %   Neutro Abs 2.7 1.7 - 7.7 K/uL   Lymphs Abs 0.8 0.7 - 4.0 K/uL   Monocytes Absolute 0.4 0.1 - 1.0 K/uL   Eosinophils Absolute 0.0 0.0 - 0.7 K/uL   Basophils Absolute 0.0 0.0 - 0.1 K/uL   RBC Morphology BURR CELLS     Comment: TARGET CELLS   WBC Morphology INCREASED BANDS (>20% BANDS)     Comment: ATYPICAL LYMPHOCYTES  Protime-INR     Status: Abnormal   Collection Time: 10/31/2016 12:37 PM  Result Value Ref Range   Prothrombin Time 21.2 (H) 11.4 - 15.2 seconds   INR 1.80   APTT     Status: Abnormal   Collection Time: 10/30/2016 12:37 PM  Result Value Ref Range   aPTT 91 (H) 24 - 36 seconds    Comment:        IF BASELINE aPTT IS ELEVATED, SUGGEST PATIENT RISK ASSESSMENT BE USED TO DETERMINE APPROPRIATE ANTICOAGULANT THERAPY.   Comprehensive metabolic panel     Status: Abnormal   Collection Time:  10/20/2016 12:37 PM  Result Value Ref Range   Sodium 132 (L) 135 - 145 mmol/L   Potassium 4.6 3.5 - 5.1 mmol/L   Chloride 98 (L) 101 - 111 mmol/L   CO2 17 (L) 22 - 32 mmol/L   Glucose, Bld 43 (LL) 65 - 99 mg/dL    Comment: CRITICAL RESULT CALLED TO, READ BACK BY AND VERIFIED WITH: G.NIKOLICH,RN [id number] 12/09/79 CLARK,S    BUN 53 (H) 6 - 20 mg/dL   Creatinine, Ser 2.63 (H) 0.44 - 1.00 mg/dL   Calcium 8.0 (L) 8.9 - 10.3 mg/dL   Total Protein 5.8 (L) 6.5 - 8.1 g/dL   Albumin 2.5 (L) 3.5 - 5.0 g/dL   AST 31 15 - 41 U/L   ALT 14 14 - 54 U/L   Alkaline Phosphatase 62 38 - 126 U/L   Total Bilirubin 2.4 (H) 0.3 - 1.2 mg/dL   GFR calc non Af Amer 18 (L) >60 mL/min   GFR calc Af Amer 21 (L) >60 mL/min    Comment: (NOTE) The eGFR has been calculated using the CKD EPI equation. This calculation has not been validated in all clinical situations. eGFR's persistently <60 mL/min signify possible Chronic Kidney Disease.    Anion gap 17 (H) 5 - 15  Lipid panel     Status: Abnormal   Collection Time: 10/21/2016 12:37 PM  Result Value Ref Range   Cholesterol 41 0 - 200 mg/dL   Triglycerides 83 <150 mg/dL   HDL 11 (L) >40 mg/dL   Total CHOL/HDL Ratio 3.7 RATIO   VLDL 17 0 - 40 mg/dL   LDL Cholesterol 13 0 - 99 mg/dL    Comment:        Total Cholesterol/HDL:CHD Risk Coronary Heart Disease Risk Table                     Men   Women  1/2 Average Risk   3.4   3.3  Average Risk  5.0   4.4  2 X Average Risk   9.6   7.1  3 X Average Risk  23.4   11.0        Use the calculated Patient Ratio above and the CHD Risk Table to determine the patient's CHD Risk.        ATP III CLASSIFICATION (LDL):  <100     mg/dL   Optimal  100-129  mg/dL   Near or Above                    Optimal  130-159  mg/dL   Borderline  160-189  mg/dL   High  >190     mg/dL   Very High   I-Stat CG4 Lactic Acid, ED     Status: Abnormal   Collection Time: 10/26/2016  1:25 PM  Result Value Ref Range   Lactic Acid, Venous 5.27  (HH) 0.5 - 1.9 mmol/L   Comment NOTIFIED PHYSICIAN   CBG monitoring, ED     Status: Abnormal   Collection Time: 10/16/2016  2:37 PM  Result Value Ref Range   Glucose-Capillary 119 (H) 65 - 99 mg/dL    Imaging / Studies: Ct Abdomen Pelvis Wo Contrast  Result Date: 10/15/2016 CLINICAL DATA:  Diffuse abdominal pain with distention. Status post laparoscopic bilateral inguinal, bilateral obturator, and umbilical hernia repair with mesh on 09/28/2016. EXAM: CT ABDOMEN AND PELVIS WITHOUT CONTRAST TECHNIQUE: Multidetector CT imaging of the abdomen and pelvis was performed following the standard protocol without IV contrast. COMPARISON:  08/27/2015 FINDINGS: Lower chest: Heart is markedly enlarged. There is compressive atelectasis in the lower lungs. Probable small volume fluid in the right major fissure. Hepatobiliary: Insert normal noncontrast liver gallbladder not well seen. No intrahepatic or extrahepatic biliary dilation. Pancreas: No focal mass lesion. No dilatation of the main duct. No intraparenchymal cyst. No peripancreatic edema. Spleen: No splenomegaly. No focal mass lesion. Adrenals/Urinary Tract: No adrenal nodule or mass. No stones seen in either kidney. No hydronephrosis. The urinary bladder appears normal for the degree of distention. Stomach/Bowel: Stomach not well seen. No small bowel dilatation. No colonic dilatation. Vascular/Lymphatic: There is abdominal aortic atherosclerosis without aneurysm. Limited assessment for intraperitoneal lymphadenopathy given congestion, edema, fluid, and lack of intravenous contrast material. Reproductive: Fibroid change noted in the uterus. No definite adnexal mass. Other: Moderate volume ascites. Musculoskeletal: Fluid collection is identified deep to the rectus sheath, starting just above the umbilicus and tracking caudally down towards the symphysis pubis. This is extraperitoneal. There is another fluid collection on the antro lateral right lower abdominal wall  the contains debris and scattered gas bubbles (see image 34 series 6 and image 51 series 6). This does not appear to represent gas within bowel and could be gas with an and hematoma raising the question of superinfection. 6 x 9 cm fluid collection identified in the left groin. 9 x 10 cm heterogeneous fluid collection identified in the right groin.Bone windows reveal no worrisome lytic or sclerotic osseous lesions. IMPRESSION: 1. Moderate volume ascites. 2. Interval development of an apparent extraperitoneal fluid collection tracking deep to the rectus sheath beginning just cranial to the umbilicus and tracking down towards the symphysis pubis. Given the history of surgery in this region, postoperative seroma/hematoma would be a consideration. Infection not excluded. 3. Fluid collection tracking along the anterolateral right lower abdominal wall appears to be extraperitoneal although this is difficult to evaluate given the lack of intravenous and oral contrast. Gas contained within this collection does  not appear consistent with an intraluminal location raising the question of gas within a hematoma or seroma. As such, superinfection of this collection is a concern. 4. Bilateral fluid collections identified in the groin regions. Patient is status post recent bilateral groin hernia repair and imaging features are compatible with recurrence. The collection in the right groin areas heterogeneous and may represent clot. No definite bowel within the groin collections. 5. Moderate ascites. 6. Marked cardiomegaly Electronically Signed   By: Misty Stanley M.D.   On: 10/19/2016 13:27   Dg Chest Portable 1 View  Result Date: 10/14/2016 CLINICAL DATA:  Hypotension EXAM: PORTABLE CHEST 1 VIEW COMPARISON:  09/30/2016 FINDINGS: Chronic marked cardiopericardial enlargement. Poorly visualized bases: There is atelectasis on contemporaneous abdominal CT. No evidence of edema, effusion, or pneumothorax. Single chamber ICD/ pacer into  the right ventricle. IMPRESSION: 1. Bibasilar atelectasis. 2. Chronic marked cardiomegaly. Electronically Signed   By: Monte Fantasia M.D.   On: 10/14/2016 13:51    Medications / Allergies: per chart  Antibiotics: Anti-infectives    Start     Dose/Rate Route Frequency Ordered Stop   10/13/16 0000  vancomycin (VANCOCIN) IVPB 750 mg/150 ml premix     750 mg 150 mL/hr over 60 Minutes Intravenous Every 48 hours 10/15/2016 1512     10/10/2016 1515  clindamycin (CLEOCIN) IVPB 600 mg     600 mg 100 mL/hr over 30 Minutes Intravenous Every 8 hours 10/16/2016 1504     10/03/2016 1515  piperacillin-tazobactam (ZOSYN) IVPB 2.25 g     2.25 g 100 mL/hr over 30 Minutes Intravenous Every 8 hours 10/10/2016 1512     10/17/2016 1515  vancomycin (VANCOCIN) 1,250 mg in sodium chloride 0.9 % 250 mL IVPB     1,250 mg 166.7 mL/hr over 90 Minutes Intravenous  Once 10/09/2016 1512          Note: Portions of this report may have been transcribed using voice recognition software. Every effort was made to ensure accuracy; however, inadvertent computerized transcription errors may be present.   Any transcriptional errors that result from this process are unintentional.     Adin Hector, M.D., F.A.C.S. Gastrointestinal and Minimally Invasive Surgery [facility] [address] [place], [place] [address] [phone] Main / Paging   11/02/2016

## 2016-10-11 NOTE — ED Notes (Signed)
Patient alert answering and follow commands appropriate. Conversation with patient and 3 family members well.

## 2016-10-11 NOTE — ED Notes (Signed)
ED Doctor at bedside.  

## 2016-10-11 NOTE — Progress Notes (Signed)
This is a 66 year old with known history of chronic systolic heart failure with ejection fraction of 20% and previous stenting. She was recently discharged from the hospital after extensive hernia repair. She presents with mental status change, weakness and hypotension. EKG showed inferior ST elevation with reciprocal changes which is different from most recent EKG. The plan was to proceed with emergent cardiac catheterization. She was given 3000 units of unfractionated heparin. The bulging in her lower abdomen was noted to be bigger after heparin was given and thus there is concern about possible internal bleeding or mechanical complication from her surgery. Due to all of that, I recommend stabilizing the patient, checking her labs and obtaining stat surgical consult to make sure it is safe to give the patient anti-thrombotic and antiplatelet medications. The patient was taken back to the ED. Continue supportive care. Patient is extremely sick and high risk for decompensation.

## 2016-10-12 ENCOUNTER — Inpatient Hospital Stay (HOSPITAL_COMMUNITY): Payer: Medicare HMO

## 2016-10-12 DIAGNOSIS — J810 Acute pulmonary edema: Secondary | ICD-10-CM

## 2016-10-12 DIAGNOSIS — N179 Acute kidney failure, unspecified: Secondary | ICD-10-CM

## 2016-10-12 DIAGNOSIS — I213 ST elevation (STEMI) myocardial infarction of unspecified site: Secondary | ICD-10-CM

## 2016-10-12 DIAGNOSIS — I361 Nonrheumatic tricuspid (valve) insufficiency: Secondary | ICD-10-CM

## 2016-10-12 DIAGNOSIS — R570 Cardiogenic shock: Secondary | ICD-10-CM

## 2016-10-12 DIAGNOSIS — L8990 Pressure ulcer of unspecified site, unspecified stage: Secondary | ICD-10-CM | POA: Insufficient documentation

## 2016-10-12 DIAGNOSIS — R109 Unspecified abdominal pain: Secondary | ICD-10-CM

## 2016-10-12 LAB — GLUCOSE, CAPILLARY
GLUCOSE-CAPILLARY: 179 mg/dL — AB (ref 65–99)
GLUCOSE-CAPILLARY: 90 mg/dL (ref 65–99)
GLUCOSE-CAPILLARY: 90 mg/dL (ref 65–99)
Glucose-Capillary: 152 mg/dL — ABNORMAL HIGH (ref 65–99)
Glucose-Capillary: 95 mg/dL (ref 65–99)
Glucose-Capillary: 74 mg/dL (ref 65–99)

## 2016-10-12 LAB — BLOOD CULTURE ID PANEL (REFLEXED)
Acinetobacter baumannii: NOT DETECTED
CANDIDA TROPICALIS: NOT DETECTED
Candida albicans: NOT DETECTED
Candida glabrata: NOT DETECTED
Candida krusei: NOT DETECTED
Candida parapsilosis: NOT DETECTED
Carbapenem resistance: NOT DETECTED
ENTEROBACTER CLOACAE COMPLEX: NOT DETECTED
Enterobacteriaceae species: DETECTED — AB
Enterococcus species: NOT DETECTED
Escherichia coli: DETECTED — AB
HAEMOPHILUS INFLUENZAE: NOT DETECTED
Klebsiella oxytoca: NOT DETECTED
Klebsiella pneumoniae: NOT DETECTED
Listeria monocytogenes: NOT DETECTED
NEISSERIA MENINGITIDIS: NOT DETECTED
PROTEUS SPECIES: NOT DETECTED
Pseudomonas aeruginosa: NOT DETECTED
SERRATIA MARCESCENS: NOT DETECTED
STAPHYLOCOCCUS AUREUS BCID: NOT DETECTED
STAPHYLOCOCCUS SPECIES: NOT DETECTED
STREPTOCOCCUS AGALACTIAE: NOT DETECTED
STREPTOCOCCUS SPECIES: NOT DETECTED
Streptococcus pneumoniae: NOT DETECTED
Streptococcus pyogenes: NOT DETECTED

## 2016-10-12 LAB — COOXEMETRY PANEL
CARBOXYHEMOGLOBIN: 1.6 % — AB (ref 0.5–1.5)
Methemoglobin: 1 % (ref 0.0–1.5)
O2 Saturation: 67.5 %
Total hemoglobin: 7.7 g/dL — ABNORMAL LOW (ref 12.0–16.0)

## 2016-10-12 LAB — CBC
HCT: 22.8 % — ABNORMAL LOW (ref 36.0–46.0)
HEMATOCRIT: 22.6 % — AB (ref 36.0–46.0)
HEMOGLOBIN: 7.7 g/dL — AB (ref 12.0–15.0)
Hemoglobin: 7.6 g/dL — ABNORMAL LOW (ref 12.0–15.0)
MCH: 26.8 pg (ref 26.0–34.0)
MCH: 27.1 pg (ref 26.0–34.0)
MCHC: 33.3 g/dL (ref 30.0–36.0)
MCHC: 34.1 g/dL (ref 30.0–36.0)
MCV: 80.3 fL (ref 78.0–100.0)
MCV: 79.6 fL (ref 78.0–100.0)
PLATELETS: 278 10*3/uL (ref 150–400)
Platelets: 238 10*3/uL (ref 150–400)
RBC: 2.84 MIL/uL — ABNORMAL LOW (ref 3.87–5.11)
RBC: 2.84 MIL/uL — AB (ref 3.87–5.11)
RDW: 17.2 % — ABNORMAL HIGH (ref 11.5–15.5)
RDW: 17.1 % — AB (ref 11.5–15.5)
WBC: 2.9 10*3/uL — AB (ref 4.0–10.5)
WBC: 3 10*3/uL — ABNORMAL LOW (ref 4.0–10.5)

## 2016-10-12 LAB — URINALYSIS, ROUTINE W REFLEX MICROSCOPIC
Bilirubin Urine: NEGATIVE
GLUCOSE, UA: NEGATIVE mg/dL
Ketones, ur: NEGATIVE mg/dL
Leukocytes, UA: NEGATIVE
NITRITE: NEGATIVE
PROTEIN: NEGATIVE mg/dL
Specific Gravity, Urine: 1.011 (ref 1.005–1.030)
WBC, UA: NONE SEEN WBC/hpf (ref 0–5)
pH: 5 (ref 5.0–8.0)

## 2016-10-12 LAB — BASIC METABOLIC PANEL
ANION GAP: 11 (ref 5–15)
ANION GAP: 12 (ref 5–15)
BUN: 56 mg/dL — ABNORMAL HIGH (ref 6–20)
BUN: 58 mg/dL — ABNORMAL HIGH (ref 6–20)
CO2: 22 mmol/L (ref 22–32)
CO2: 19 mmol/L — ABNORMAL LOW (ref 22–32)
Calcium: 7.2 mg/dL — ABNORMAL LOW (ref 8.9–10.3)
Calcium: 7.2 mg/dL — ABNORMAL LOW (ref 8.9–10.3)
Chloride: 93 mmol/L — ABNORMAL LOW (ref 101–111)
Chloride: 93 mmol/L — ABNORMAL LOW (ref 101–111)
Creatinine, Ser: 2.56 mg/dL — ABNORMAL HIGH (ref 0.44–1.00)
Creatinine, Ser: 2.31 mg/dL — ABNORMAL HIGH (ref 0.44–1.00)
GFR calc Af Amer: 24 mL/min — ABNORMAL LOW (ref >60)
GFR, EST AFRICAN AMERICAN: 21 mL/min — AB (ref >60)
GFR, EST NON AFRICAN AMERICAN: 18 mL/min — AB (ref >60)
GFR, EST NON AFRICAN AMERICAN: 21 mL/min — AB (ref >60)
GLUCOSE: 92 mg/dL (ref 65–99)
Glucose, Bld: 167 mg/dL — ABNORMAL HIGH (ref 65–99)
POTASSIUM: 4 mmol/L (ref 3.5–5.1)
POTASSIUM: 3.9 mmol/L (ref 3.5–5.1)
SODIUM: 124 mmol/L — AB (ref 135–145)
Sodium: 126 mmol/L — ABNORMAL LOW (ref 135–145)

## 2016-10-12 LAB — ECHOCARDIOGRAM COMPLETE
Height: 66 in
Weight: 2042.34 oz

## 2016-10-12 LAB — MAGNESIUM: Magnesium: 1.5 mg/dL — ABNORMAL LOW (ref 1.7–2.4)

## 2016-10-12 LAB — TROPONIN I: Troponin I: 0.39 ng/mL (ref <0.03)

## 2016-10-12 LAB — PROCALCITONIN: PROCALCITONIN: 32.02 ng/mL

## 2016-10-12 LAB — PHOSPHORUS: PHOSPHORUS: 4.8 mg/dL — AB (ref 2.5–4.6)

## 2016-10-12 MED ORDER — BLISTEX MEDICATED EX OINT
TOPICAL_OINTMENT | CUTANEOUS | Status: DC | PRN
  Filled 2016-10-12: qty 6.3

## 2016-10-12 MED ORDER — OXYCODONE HCL 5 MG PO TABS
5.0000 mg | ORAL_TABLET | Freq: Four times a day (QID) | ORAL | Status: DC | PRN
  Administered 2016-10-13 – 2016-10-15 (×3): 5 mg via ORAL
  Filled 2016-10-12 (×3): qty 1

## 2016-10-12 MED ORDER — MENTHOL 3 MG MT LOZG: 1.0000 | LOZENGE | OROMUCOSAL | Status: DC | PRN

## 2016-10-12 MED ORDER — ACETAMINOPHEN 325 MG PO TABS
325.0000 mg | ORAL_TABLET | Freq: Four times a day (QID) | ORAL | Status: DC | PRN
  Administered 2016-10-13: 650 mg via ORAL
  Filled 2016-10-12: qty 2

## 2016-10-12 MED ORDER — ACETAMINOPHEN 650 MG RE SUPP: 650.0000 mg | Freq: Four times a day (QID) | RECTAL | Status: DC | PRN

## 2016-10-12 MED ORDER — POLYETHYLENE GLYCOL 3350 17 G PO PACK: 17.0000 g | PACK | Freq: Two times a day (BID) | ORAL | Status: DC | PRN

## 2016-10-12 MED ORDER — NOREPINEPHRINE BITARTRATE 1 MG/ML IV SOLN
0.0000 ug/min | INTRAVENOUS | Status: DC
  Administered 2016-10-12: 23 ug/min via INTRAVENOUS
  Administered 2016-10-13: 17 ug/min via INTRAVENOUS
  Administered 2016-10-14: 24 ug/min via INTRAVENOUS
  Administered 2016-10-15: 40 ug/min via INTRAVENOUS
  Administered 2016-10-15: 22 ug/min via INTRAVENOUS
  Administered 2016-10-15: 35 ug/min via INTRAVENOUS
  Administered 2016-10-16 – 2016-10-17 (×4): 40 ug/min via INTRAVENOUS
  Administered 2016-10-18: 38 ug/min via INTRAVENOUS
  Administered 2016-10-18: 40 ug/min via INTRAVENOUS
  Filled 2016-10-12 (×16): qty 16

## 2016-10-12 MED ORDER — FUROSEMIDE 10 MG/ML IJ SOLN
20.0000 mg | Freq: Once | INTRAMUSCULAR | Status: AC
  Administered 2016-10-12: 20 mg via INTRAVENOUS
  Filled 2016-10-12: qty 2

## 2016-10-12 MED ORDER — POLYETHYLENE GLYCOL 3350 17 G PO PACK
17.0000 g | PACK | Freq: Two times a day (BID) | ORAL | Status: DC
Start: 2016-10-12 — End: 2016-10-18
  Administered 2016-10-12 – 2016-10-15 (×5): 17 g via ORAL
  Filled 2016-10-12 (×5): qty 1

## 2016-10-12 MED ORDER — BISACODYL 10 MG RE SUPP
10.0000 mg | Freq: Two times a day (BID) | RECTAL | Status: DC | PRN
  Administered 2016-10-14: 10 mg via RECTAL
  Filled 2016-10-12: qty 1

## 2016-10-12 MED ORDER — MAGNESIUM SULFATE IN D5W 1-5 GM/100ML-% IV SOLN
1.0000 g | Freq: Once | INTRAVENOUS | Status: AC
  Administered 2016-10-12: 1 g via INTRAVENOUS
  Filled 2016-10-12: qty 100

## 2016-10-12 MED ORDER — MAGIC MOUTHWASH
15.0000 mL | Freq: Four times a day (QID) | ORAL | Status: DC | PRN
Start: 2016-10-12 — End: 2016-10-19

## 2016-10-12 MED ORDER — ALUM & MAG HYDROXIDE-SIMETH 200-200-20 MG/5ML PO SUSP
30.0000 mL | Freq: Four times a day (QID) | ORAL | Status: DC | PRN
  Filled 2016-10-12: qty 30

## 2016-10-12 MED ORDER — HYDROCORTISONE NA SUCCINATE PF 100 MG IJ SOLR
100.0000 mg | Freq: Three times a day (TID) | INTRAMUSCULAR | Status: DC
  Administered 2016-10-12 – 2016-10-14 (×8): 100 mg via INTRAVENOUS
  Filled 2016-10-12 (×8): qty 2

## 2016-10-12 MED ORDER — LIP MEDEX EX OINT
1.0000 "application " | TOPICAL_OINTMENT | Freq: Two times a day (BID) | CUTANEOUS | Status: DC
  Filled 2016-10-12: qty 7

## 2016-10-12 MED ORDER — PHENOL 1.4 % MT LIQD: 2.0000 | OROMUCOSAL | Status: DC | PRN

## 2016-10-12 MED ORDER — FUROSEMIDE 10 MG/ML IJ SOLN
20.0000 mg | Freq: Two times a day (BID) | INTRAMUSCULAR | Status: AC
  Administered 2016-10-12 – 2016-10-13 (×2): 20 mg via INTRAVENOUS
  Filled 2016-10-12 (×2): qty 2

## 2016-10-12 MED ORDER — DEXTROSE 5 % IV SOLN
2.0000 g | INTRAVENOUS | Status: DC
  Administered 2016-10-12 – 2016-10-15 (×4): 2 g via INTRAVENOUS
  Filled 2016-10-12 (×5): qty 2

## 2016-10-12 MED ORDER — METOPROLOL TARTRATE 5 MG/5ML IV SOLN
2.5000 mg | INTRAVENOUS | Status: DC | PRN
  Administered 2016-10-12 – 2016-10-13 (×2): 2.5 mg via INTRAVENOUS
  Administered 2016-10-14: 5 mg via INTRAVENOUS
  Filled 2016-10-12 (×3): qty 5

## 2016-10-12 NOTE — Progress Notes (Signed)
Pharmacy Antibiotic Note  Romona CurlsCarolyn Segler is a 66 y.o. female admitted on 10/27/2016 with sepsis found to have E. Coli bacteremia.  Pharmacy has been consulted for CTX dosing. -WBC 3.9>2.9, afeb -LA 5.27>2.7  Plan: Stop zosyn, vanc, clindamycin Start CTX 2g q24h -Monitor clinical progress, c/s, LOT   Height: 5\' 6"  (167.6 cm) Weight: 127 lb 10.3 oz (57.9 kg) IBW/kg (Calculated) : 59.3  Temp (24hrs), Avg:98.6 F (37 C), Min:98.2 F (36.8 C), Max:98.8 F (37.1 C)   Recent Labs Lab 10/06/16 0406 10/07/16 1047 10/17/2016 1208 10/26/2016 1237 10/21/2016 1325 10/17/2016 1600 10/21/2016 1924 10/12/16 0454  WBC 9.0  --  3.9*  --   --   --   --  2.9*  CREATININE  --  0.76  --  2.63*  --   --   --  2.56*  LATICACIDVEN  --   --   --   --  5.27* 3.0* 2.7*  --     Estimated Creatinine Clearance: 19.5 mL/min (A) (by C-G formula based on SCr of 2.56 mg/dL (H)).    Allergies  Allergen Reactions  . No Known Allergies     Antimicrobials this admission: -Vanc 5/9>5/10 -Zosyn 5/9>5/10 -Clinda 5/9>5/10 -CTX 5/10>  Dose adjustments this admission: none  Microbiology results: -5/9 BCx: E. Coli   Carroll SageKai Athalene Kolle (Kenny), PharmD  PGY1 Pharmacy Resident Pager: [phone] 10/12/2016 12:46 PM

## 2016-10-12 NOTE — Progress Notes (Signed)
Pt was previously weaned down to 2L from 4L today and satting an average around 90-93%.  Over the last few hours, pt has gradually started to require more oxygen.  Pt doesn't look distressed, but now requiring 5L via [place].  Lungs are diminished.  Can get patient's sats up temporarily with deep breathing and coughing but will not sustain once pt relaxes.  Seems like an effort issue.  Notified Dr. Kimberlee Nearingamachandrar who orders xray and bipap.

## 2016-10-12 NOTE — Progress Notes (Signed)
PULMONARY / CRITICAL CARE MEDICINE   Name: Sheri CurlsCarolyn Noon MRN: [id number] DOB: [birth year]    ADMISSION DATE:  10/03/2016 CONSULTATION DATE:  10/12/2016  REFERRING MD:  Dr. 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   Presents 5/9 with mental status changes, weakness, and hypotension. This morning patient reported lightheadedness and loss of vision. Reports that ever since surgery has had problems with hypotension and hypoglycemia. EKG revealed inferior ST elevation with reciprocal changes. Code STEMI activated. Cardiology consulted. Patient taken to Cath-Lab and given 3,000u of unfractionated heparin, it was then noted that the bulging of hernia had increased in size, with concern for internal bleed cath was held and patient taken back to ED. CT A/P revealed extraperitoneal fluid collection tracking deep to the rectus sheath, questioning seroma/hematoma not excluding infection. Surgery consulted. In ED patient had progressive hypotension, was given 1L bolus NS without improvement, requiring levophed gtt. PCCM asked to admit.    SUBJECTIVE:  Denies chest pain.  Some abd discomfort. (-) nasuea/vomiting On levophed + vasopressin + milrinone drips.  On less levophed.    VITAL SIGNS: BP (!) 108/45   Pulse (!) 106   Temp 98.8 F (37.1 C) (Oral)   Resp (!) 42   Ht 5\' 6"  (1.676 m)   Wt 57.9 kg (127 lb 10.3 oz)   SpO2 92%   BMI 20.60 kg/m   HEMODYNAMICS: CVP:  [8 mmHg-10 mmHg] 10 mmHg  VENTILATOR SETTINGS:    INTAKE / OUTPUT: I/O last 3 completed shifts: In: 4136.4 [I.V.:3586.4; IV Piggyback:550] Out: 200 [Urine:200]  PHYSICAL EXAMINATION: General:  Adult female, mild distress, awake, oriented x 3 Neuro:  Alert and oriented, follows commands, pupils  intact   HEENT:  Normocephalic   Cardiovascular: good s1/s2. (-) s3.m.r.g Lungs:  Diminished to bases, mild tachypnea. Crackles at bases.  Abdomen:  Enlarged hernia at pubic region and left lower region. Mild epugastric tenderness/fullness Musculoskeletal:  No acute  Skin:  Warm, dry, intact . Gr 1 edema  LABS:  BMET  Recent Labs Lab 10/07/16 1047 10/10/2016 1237 10/12/16 0454  NA 127* 132* 126*  K 3.4* 4.6 4.0  CL 92* 98* 93*  CO2 26 17* 22  BUN 23* 53* 56*  CREATININE 0.76 2.63* 2.56*  GLUCOSE 154* 43* 167*    Electrolytes  Recent Labs Lab 10/07/16 1047 10/25/2016 1237 10/12/16 0454  CALCIUM 8.3* 8.0* 7.2*  MG  --   --  1.5*  PHOS  --   --  4.8*    CBC  Recent Labs Lab 10/06/16 0406 10/23/2016 1208 10/12/16 0454  WBC 9.0 3.9* 2.9*  HGB 9.5* 9.8* 7.6*  HCT 29.0* 29.0* 22.8*  PLT 189 311 278    Coag's  Recent Labs Lab 10/06/2016 1237  APTT 91*  INR 1.80    Sepsis Markers  Recent Labs Lab 10/15/2016 1325 10/10/2016 1417 10/19/2016 1600 10/05/2016 1924 10/12/16 0454  LATICACIDVEN 5.27*  --  3.0* 2.7*  --   PROCALCITON  --  37.31  --   --  32.02    ABG No results for input(s): PHART, PCO2ART, PO2ART in the last 168 hours.  Liver Enzymes  Recent Labs Lab 10/23/2016 1237  AST 31  ALT 14  ALKPHOS 62  BILITOT 2.4*  ALBUMIN  2.5*    Cardiac Enzymes  Recent Labs Lab 10/21/2016 1417 10/29/2016 2017 10/12/16 0304  TROPONINI 0.43* 0.46* 0.39*    Glucose  Recent Labs Lab 10/08/2016 1437 10/31/2016 1944 10/23/2016 2335 10/12/16 0340 10/12/16 0750  GLUCAP 119* 132* 179* 179* 152*    Imaging Ct Abdomen Pelvis Wo Contrast  Result Date: 10/12/2016 CLINICAL DATA:  Diffuse abdominal pain with distention. Status post laparoscopic bilateral inguinal, bilateral obturator, and umbilical hernia repair with mesh on 09/28/2016. EXAM: CT ABDOMEN AND PELVIS WITHOUT CONTRAST TECHNIQUE: Multidetector CT imaging of the abdomen and pelvis was performed following the  standard protocol without IV contrast. COMPARISON:  08/27/2015 FINDINGS: Lower chest: Heart is markedly enlarged. There is compressive atelectasis in the lower lungs. Probable small volume fluid in the right major fissure. Hepatobiliary: Insert normal noncontrast liver gallbladder not well seen. No intrahepatic or extrahepatic biliary dilation. Pancreas: No focal mass lesion. No dilatation of the main duct. No intraparenchymal cyst. No peripancreatic edema. Spleen: No splenomegaly. No focal mass lesion. Adrenals/Urinary Tract: No adrenal nodule or mass. No stones seen in either kidney. No hydronephrosis. The urinary bladder appears normal for the degree of distention. Stomach/Bowel: Stomach not well seen. No small bowel dilatation. No colonic dilatation. Vascular/Lymphatic: There is abdominal aortic atherosclerosis without aneurysm. Limited assessment for intraperitoneal lymphadenopathy given congestion, edema, fluid, and lack of intravenous contrast material. Reproductive: Fibroid change noted in the uterus. No definite adnexal mass. Other: Moderate volume ascites. Musculoskeletal: Fluid collection is identified deep to the rectus sheath, starting just above the umbilicus and tracking caudally down towards the symphysis pubis. This is extraperitoneal. There is another fluid collection on the antro lateral right lower abdominal wall the contains debris and scattered gas bubbles (see image 34 series 6 and image 51 series 6). This does not appear to represent gas within bowel and could be gas with an and hematoma raising the question of superinfection. 6 x 9 cm fluid collection identified in the left groin. 9 x 10 cm heterogeneous fluid collection identified in the right groin.Bone windows reveal no worrisome lytic or sclerotic osseous lesions. IMPRESSION: 1. Moderate volume ascites. 2. Interval development of an apparent extraperitoneal fluid collection tracking deep to the rectus sheath beginning just cranial to the  umbilicus and tracking down towards the symphysis pubis. Given the history of surgery in this region, postoperative seroma/hematoma would be a consideration. Infection not excluded. 3. Fluid collection tracking along the anterolateral right lower abdominal wall appears to be extraperitoneal although this is difficult to evaluate given the lack of intravenous and oral contrast. Gas contained within this collection does not appear consistent with an intraluminal location raising the question of gas within a hematoma or seroma. As such, superinfection of this collection is a concern. 4. Bilateral fluid collections identified in the groin regions. Patient is status post recent bilateral groin hernia repair and imaging features are compatible with recurrence. The collection in the right groin areas heterogeneous and may represent clot. No definite bowel within the groin collections. 5. Moderate ascites. 6. Marked cardiomegaly Electronically Signed   By: Kennith Center M.D.   On: 10/26/2016 13:27   Dg Chest Port 1 View  Result Date: 10/24/2016 CLINICAL DATA:  Central line placement. EXAM: PORTABLE CHEST 1 VIEW COMPARISON:  10/22/2016. FINDINGS: Right IJ central line tip projects at the SVC RA junction. Single lead ICD device projects over the right ventricle. Heart is moderately enlarged. Lungs are somewhat low in volume with bibasilar atelectasis. No definite pleural fluid. No pneumothorax.  IMPRESSION: 1. Right IJ central line tip projects at the SVC RA junction. No pneumothorax. 2. Bibasilar atelectasis. Electronically Signed   By: Leanna Battles M.D.   On: 10/10/2016 16:41   Dg Chest Portable 1 View  Result Date: 10/09/2016 CLINICAL DATA:  Hypotension EXAM: PORTABLE CHEST 1 VIEW COMPARISON:  09/30/2016 FINDINGS: Chronic marked cardiopericardial enlargement. Poorly visualized bases: There is atelectasis on contemporaneous abdominal CT. No evidence of edema, effusion, or pneumothorax. Single chamber ICD/ pacer into  the right ventricle. IMPRESSION: 1. Bibasilar atelectasis. 2. Chronic marked cardiomegaly. Electronically Signed   By: Marnee Spring M.D.   On: 10/06/2016 13:51     STUDIES:  CT A/P 5/9 > Moderate Volume Ascites , interval development of an apparent extraperitoneal fluid collection tracking deep to the rectus sheath beginning just cranial to the umbilicus and tracking down towards the symphysis pubis. Seroma/Hematoma consideration, infection not excluded, fluid collection tracking along the anterolateral right lower abdominal wall appears to be extraperitoneal although, bilateral fluid collections identified in the groin regions, moderate ascites, cardiomegaly  CXR 5/9 > bibasilar atelectasis, chronic marked cardiomegaly   CULTURES: Blood 5/9 >> Ecoli   ANTIBIOTICS: Vancomycin 5/9 >> 5/10 Zosyn 5/9 >> 5/10 Clindamycin 5/9 >> 5/10 Rocephin 5/10 >   SIGNIFICANT EVENTS: 5/9 > Presents to ED > Code STEMI   LINES/TUBES: None.   DISCUSSION: 66 year old female with extensive cardiac history presents to ED on 5/9 as Code STEMI. While in Cath-lab after being administered heparin she was noted to have increase in size of hernias, with concern for internal bleeding cath was held and patient was taken back to ED. In ED patient had significant hypotension requiring levophed gtt. Has Ecoli bacteremia  ASSESSMENT / PLAN:  PULMONARY A: Mild Pulmonary edema P:   Maintain saturation >90 Diuresce gently Pulmonary Hygiene    CARDIOVASCULAR A:  STEMI Hypotension related to STEMI + sepsis (Ecoli in blood) H/O CHF (EF 20-25), CAD S/P ICD, A.Flutter  Mild Pulm Edema P:  On levophed + vasopressin + milrinone.  Wean of pressors to keep MAP 60, systolic 90 as long as awake and perfusing.   Gently diuresce Cardiac Monitoring  Hold home Metoprolol , Lipitor, aldactone, ASA  For now.  Restart when taking PO Maintain MAP >60    RENAL A:   Acute Kidney Injury 2/2 shock ( STEMI + sepsis with  Ecoli bacteremia) Lactic Acidosis  P:   Lasix 20 mg IV x 1. May need a foley Trend BMP Replace electrolytes as needed    GASTROINTESTINAL A:   Cirrhosis  Malnutrition, moderate P:   Trial with clears PPI Trend LFT    HEMATOLOGIC A:   Leukopenia  Concern for abd hematoma P:  Trend CBC  Hold anticoagulation in setting of possible internal abd bleeding. Observing for now. Check cbc now >> if Hb drops, then will transfuse and likely will need scan of abd   INFECTIOUS A:   Septic Shock 2/2 Ecoli bacteremia, likely from abdominal source P:   Switch abx to rocephin Follow Culture data  Add stress dose steroids for septic shock Cont levophed + vasopressin >> levophed being tapered down Surgery following   ENDOCRINE A:   Hypoglycemia . resolved  P:   Trend Glucose  Start clears, advance as tolerated   NEUROLOGIC A:   Acute Encephalopathy. Improved.  P:   RASS goal: 0 Monitor  Hold sedating medications   FAMILY  - Updates: Family at bedside, updated on plan on 5/10  -  Inter-disciplinary family meet or Palliative Care meeting due by: 5/16    CC Time: 30  minutes   J. Alexis Frock, MD 10/12/2016, 12:43 PM [facility] Pager [phone] After 3 pm or if no answer, call [phone]

## 2016-10-12 NOTE — Progress Notes (Signed)
PHARMACY - PHYSICIAN COMMUNICATION CRITICAL VALUE ALERT - BLOOD CULTURE IDENTIFICATION (BCID)  Results for orders placed or performed during the hospital encounter of 10/10/2016  Blood Culture ID Panel (Reflexed) (Collected: 10/26/2016  3:06 PM)  Result Value Ref Range   Enterococcus species NOT DETECTED NOT DETECTED   Listeria monocytogenes NOT DETECTED NOT DETECTED   Staphylococcus species NOT DETECTED NOT DETECTED   Staphylococcus aureus NOT DETECTED NOT DETECTED   Streptococcus species NOT DETECTED NOT DETECTED   Streptococcus agalactiae NOT DETECTED NOT DETECTED   Streptococcus pneumoniae NOT DETECTED NOT DETECTED   Streptococcus pyogenes NOT DETECTED NOT DETECTED   Acinetobacter baumannii NOT DETECTED NOT DETECTED   Enterobacteriaceae species DETECTED (A) NOT DETECTED   Enterobacter cloacae complex NOT DETECTED NOT DETECTED   Escherichia coli DETECTED (A) NOT DETECTED   Klebsiella oxytoca NOT DETECTED NOT DETECTED   Klebsiella pneumoniae NOT DETECTED NOT DETECTED   Proteus species NOT DETECTED NOT DETECTED   Serratia marcescens NOT DETECTED NOT DETECTED   Carbapenem resistance NOT DETECTED NOT DETECTED   Haemophilus influenzae NOT DETECTED NOT DETECTED   Neisseria meningitidis NOT DETECTED NOT DETECTED   Pseudomonas aeruginosa NOT DETECTED NOT DETECTED   Candida albicans NOT DETECTED NOT DETECTED   Candida glabrata NOT DETECTED NOT DETECTED   Candida krusei NOT DETECTED NOT DETECTED   Candida parapsilosis NOT DETECTED NOT DETECTED   Candida tropicalis NOT DETECTED NOT DETECTED    Name of physician (or Provider) Contacted: Christene Slatese Dios  Changes to prescribed antibiotics required: Recommend Ceftriaxone 2g Q24H. Currently on Vanc/Zosyn/Clindamycin. Provider plans to round on patient first and then make decision regarding antibiotic change.  Gwyndolyn KaufmanKai Daman Steffenhagen Bernette Redbird(Kenny), PharmD  PGY1 Pharmacy Resident Pager: [phone] 10/12/2016 8:02 AM

## 2016-10-12 NOTE — Progress Notes (Addendum)
eLink Physician-Brief Progress Note Patient Name: Sheri CurlsCarolyn Dunn DOB: 24-Jan-1951 MRN: [id number]   Date of Service  10/12/2016  HPI/Events of Note  Worsening respiratory status, increasing CVP, poor cough/resp efforts. Diminishing mental status.   eICU Interventions  Repeat lasix dose x2. Repeat BMP and CXR now. Continue milrinone and levophed as needed.      Intervention Category Major Interventions: Hypoxemia - evaluation and management  Shane Crutchradeep Scottie Metayer 10/12/2016, 5:10 PM

## 2016-10-12 NOTE — Progress Notes (Addendum)
[facility]  [address] [place], [place] [address] Phone: [phone] FAX: [phone]   Sheri Dunn [id number] 1950-09-30    Problem List:   Principal Problem:   Cardiogenic shock [facility]) Active Problems:   Ischemic cardiomyopathy   Cirrhosis, nonalcoholic   Poorly controlled ascites   Elevated troponin   CAD S/P PCI April 2016   Aortic insufficiency   Chronic combined systolic and diastolic CHF (congestive heart failure) ([place])   ICD (implantable cardioverter-defibrillator) in place   Hypertensive heart disease   Bilateral inguinal hernias s/p lap repair with mesh 09/28/2016   Obturator hernias, bilateral, s/p lap repair with mesh 02/05/4096   Umbilical hernia s/p lap repair with mesh 09/28/2016   Constipation, chronic   Acute ST elevation myocardial infarction (STEMI) of inferior wall (HCC)   BLQ abdominal wall old hernia sac seromas - LARGE   Septic shock (HCC)   Pressure injury of skin   Abdominal pain   Acute pulmonary edema (HCC)   AKI (acute kidney injury) ([place])   1 Day Post-Op  09/28/2016  POST-OPERATIVE DIAGNOSIS:    Bilateral inguinal hernias Bilateral obturator hernias Umbilical hernia LLQ seromas  PROCEDURE:    LAPAROSCOPIC BILATERAL INGUINAL HERNIA REPAIRS WITH MESH LAPAROSCOPIC BILATERAL OBTURATOR HERNIA REPAIRS WITH MESH LAPAROSCOPIC UMBILICAL HERNIA REPAIR WITH MESH Needle aspiration of seroma INSERTION OF MESH  SURGEON:  Adin Hector, MD    Assessment  GUARDED with shock, but no evidence of abdominal infection  Plan:  Appropriate postop pain s/p lap VWH & giant BIH repairs with mesh  NO EVIDENCE OF PERITONITIS.  Same abd soreness (mild) as noted last week.  Mild & mostly controlled with tylenol & heat  Seromas/hematomas in preperitoneal space & former large hernia sacs expected.  Doubt infection.   DO NOT SAMPLE FLUID around mesh Increases risk of mesh infection & ascites leak  markedly  Large seromas at LLQ & R inguinal region at site of former hernias expected in pt with massive ascites.  Hernia sacs filled up w ascites fluid.  Not incarcerated.  Not infected.  Soft.  Stable.  Have not been redicible since POD#6.  Should stabilize & reduce over the next few months, especially if ascites can be better controlled.  Would not tap the areas as increases the risk of mesh infection which would be a major problem.  Patient feels reassured.  D/w Heart ICU RNs  I would not do IV ABx for surgical needs but understand starting for shock protocol & regrouping.  -bowel regimen for constipation.  Miralax   -Diuresis with severe CHF, cirrhosis & chronic ascites  -TED hose for BLE - defer to cardiology  -OK to anticoagulate from surgery standpoint   Adin Hector, M.D., F.A.C.S. Gastrointestinal and Minimally Invasive Surgery [facility] [address] [place], [place] [address] [phone] Main / Paging   10/12/2016  CARE TEAM:  PCP: [name], MD  Outpatient Care Team: Patient Care Team: [name], MD as PCP - General (Internal Medicine) Croitoru, Dani Gobble, MD as Consulting Physician (Cardiology) Michael Boston, MD as Consulting Physician (General Surgery)  Inpatient Treatment Team: Treatment Team: Attending Provider: Rush Farmer, MD; Consulting Physician: Nolon Nations, MD; Consulting Physician: Michael Boston, MD; Rounding Team: Pccm, Md, MD; Rounding Team: Lbcardiology, Michae Kava, MD; Consulting Physician: Sanda Klein, MD; Registered Nurse: Blondell Reveal, RN; Registered Nurse: Reola Mosher, Janine Ores, RN; Technician: Tera Mater, [place]  Subjective:  "HEY! Dr. Johney Maine!  How are you doing today?" Denies abdominal nor chest pain Bored, sitting up in bed Wants to eat Spit up a pill once - no nausea  Objective:  Vital signs:  Vitals:   10/12/16 0300 10/12/16 0400 10/12/16 0500 10/12/16 0600  BP: (!) 107/42 126/67 (!)  91/42 (!) 92/47  Pulse: 95 (!) 102 72 99  Resp: (!) 31 (!) 34 (!) 39 (!) 31  Temp:  98.5 F (36.9 C)    TempSrc:  Oral    SpO2: 94% 91% 93% (!) 89%  Weight:   57.9 kg (127 lb 10.3 oz)   Height:        Last BM Date: 10/06/16  Intake/Output   Yesterday:  05/09 0701 - 05/10 0700 In: 6226.3 [I.V.:3445.2; IV Piggyback:550] Out: 200 [Urine:200] This shift:  No intake/output data recorded.  Bowel function:  Flatus: YES  BM:  No  Drain: (No drain)   Physical Exam:  General: Pt awake/alert/oriented x4 in no acute distress.  Head on hand bored, then perks up when seeing myself & the RNs.  Chatty.   Eyes: PERRL, normal EOM.  Sclera clear.  No icterus Neuro: CN II-XII intact w/o focal sensory/motor deficits. Lymph: No head/neck/groin lymphadenopathy Psych:  No delerium/psychosis/paranoia.  Calm.  Not confused.  Chatty HENT: Normocephalic, Mucus membranes moist.  No thrush.  ETT in place Neck: Supple, No tracheal deviation Chest: Dec BS at bases.  No chest wall pain w good excursion CV:  Pulses intact.  Regular rhythm MS: Normal AROM mjr joints.  No obvious deformity  Abdomen: Soft.  Mildy distended. Chronic swelling consistent with chronic ascites  Nontender  Mod pannicular edema.  Incisions c/d/I.  DRY.  UNCHANGED  Old hernia sacs UNCHANGED.  R groin smooth & large 8x7x7cm.  Soft.  No fluctuance/warmth.  LLQ SQ seroma fluid 12x4cm soft & uinchanged  No pain/fluctuance.  No pain with cough.  No pain with bed shake.  No percussive tenderness.  No rebound tenderness. No guarding.  No evidence of peritonitis.    Ext:  Pitting edema to knees.  No cyanosis Skin: No petechiae / purpura  Results:   Labs: Results for orders placed or performed during the hospital encounter of 10/12/2016 (from the past 48 hour(s))  I-stat troponin, ED     Status: Abnormal   Collection Time: 10/10/2016 12:04 PM  Result Value Ref Range   Troponin i, poc 0.88 (HH) 0.00 - 0.08 ng/mL   Comment NOTIFIED  PHYSICIAN    Comment 3            Comment: Due to the release kinetics of cTnI, a negative result within the first hours of the onset of symptoms does not rule out myocardial infarction with certainty. If myocardial infarction is still suspected, repeat the test at appropriate intervals.   CBC with Differential/Platelet     Status: Abnormal   Collection Time: 10/20/2016 12:08 PM  Result Value Ref Range   WBC 3.9 (L) 4.0 - 10.5 K/uL    Comment: REPEATED TO VERIFY   RBC 3.55 (L) 3.87 - 5.11 MIL/uL   Hemoglobin 9.8 (L) 12.0 - 15.0 g/dL    Comment: REPEATED TO VERIFY   HCT 29.0 (L) 36.0 - 46.0 %   MCV 81.7 78.0 - 100.0 fL   MCH 27.6 26.0 - 34.0 pg   MCHC 33.8 30.0 - 36.0 g/dL   RDW 17.3 (H) 11.5 - 15.5 %   Platelets 311 150 - 400 K/uL   Neutrophils Relative % 23 %  Lymphocytes Relative 21 %   Monocytes Relative 9 %   Eosinophils Relative 0 %   Basophils Relative 0 %   Band Neutrophils 40 %   Metamyelocytes Relative 5 %   Myelocytes 0 %   Promyelocytes Absolute 0 %   Blasts 0 %   nRBC 1 (H) 0 /100 WBC   Other 2 %   Neutro Abs 2.7 1.7 - 7.7 K/uL   Lymphs Abs 0.8 0.7 - 4.0 K/uL   Monocytes Absolute 0.4 0.1 - 1.0 K/uL   Eosinophils Absolute 0.0 0.0 - 0.7 K/uL   Basophils Absolute 0.0 0.0 - 0.1 K/uL   RBC Morphology BURR CELLS     Comment: TARGET CELLS   WBC Morphology INCREASED BANDS (>20% BANDS)     Comment: ATYPICAL LYMPHOCYTES  Protime-INR     Status: Abnormal   Collection Time: 10/21/2016 12:37 PM  Result Value Ref Range   Prothrombin Time 21.2 (H) 11.4 - 15.2 seconds   INR 1.80   APTT     Status: Abnormal   Collection Time: 10/07/2016 12:37 PM  Result Value Ref Range   aPTT 91 (H) 24 - 36 seconds    Comment:        IF BASELINE aPTT IS ELEVATED, SUGGEST PATIENT RISK ASSESSMENT BE USED TO DETERMINE APPROPRIATE ANTICOAGULANT THERAPY.   Comprehensive metabolic panel     Status: Abnormal   Collection Time: 10/06/2016 12:37 PM  Result Value Ref Range   Sodium 132 (L) 135  - 145 mmol/L   Potassium 4.6 3.5 - 5.1 mmol/L   Chloride 98 (L) 101 - 111 mmol/L   CO2 17 (L) 22 - 32 mmol/L   Glucose, Bld 43 (LL) 65 - 99 mg/dL    Comment: CRITICAL RESULT CALLED TO, READ BACK BY AND VERIFIED WITH: G.NIKOLICH,RN 8101 12/07/08 CLARK,S    BUN 53 (H) 6 - 20 mg/dL   Creatinine, Ser 2.63 (H) 0.44 - 1.00 mg/dL   Calcium 8.0 (L) 8.9 - 10.3 mg/dL   Total Protein 5.8 (L) 6.5 - 8.1 g/dL   Albumin 2.5 (L) 3.5 - 5.0 g/dL   AST 31 15 - 41 U/L   ALT 14 14 - 54 U/L   Alkaline Phosphatase 62 38 - 126 U/L   Total Bilirubin 2.4 (H) 0.3 - 1.2 mg/dL   GFR calc non Af Amer 18 (L) >60 mL/min   GFR calc Af Amer 21 (L) >60 mL/min    Comment: (NOTE) The eGFR has been calculated using the CKD EPI equation. This calculation has not been validated in all clinical situations. eGFR's persistently <60 mL/min signify possible Chronic Kidney Disease.    Anion gap 17 (H) 5 - 15  Lipid panel     Status: Abnormal   Collection Time: 10/03/2016 12:37 PM  Result Value Ref Range   Cholesterol 41 0 - 200 mg/dL   Triglycerides 83 <150 mg/dL   HDL 11 (L) >40 mg/dL   Total CHOL/HDL Ratio 3.7 RATIO   VLDL 17 0 - 40 mg/dL   LDL Cholesterol 13 0 - 99 mg/dL    Comment:        Total Cholesterol/HDL:CHD Risk Coronary Heart Disease Risk Table                     Men   Women  1/2 Average Risk   3.4   3.3  Average Risk       5.0   4.4  2 X Average Risk  9.6   7.1  3 X Average Risk  23.4   11.0        Use the calculated Patient Ratio above and the CHD Risk Table to determine the patient's CHD Risk.        ATP III CLASSIFICATION (LDL):  <100     mg/dL   Optimal  100-129  mg/dL   Near or Above                    Optimal  130-159  mg/dL   Borderline  160-189  mg/dL   High  >190     mg/dL   Very High   I-Stat CG4 Lactic Acid, ED     Status: Abnormal   Collection Time: 10/24/2016  1:25 PM  Result Value Ref Range   Lactic Acid, Venous 5.27 (HH) 0.5 - 1.9 mmol/L   Comment NOTIFIED PHYSICIAN   Troponin  I (q 6hr x 3)     Status: Abnormal   Collection Time: 10/22/2016  2:17 PM  Result Value Ref Range   Troponin I 0.43 (HH) <0.03 ng/mL    Comment: CRITICAL RESULT CALLED TO, READ BACK BY AND VERIFIED WITH: Kathlen Mody 1800 10/21/2016 WBOND   Procalcitonin - Baseline     Status: None   Collection Time: 10/23/2016  2:17 PM  Result Value Ref Range   Procalcitonin 37.31 ng/mL    Comment:        Interpretation: PCT >= 10 ng/mL: Important systemic inflammatory response, almost exclusively due to severe bacterial sepsis or septic shock. (NOTE)         ICU PCT Algorithm               Non ICU PCT Algorithm    ----------------------------     ------------------------------         PCT < 0.25 ng/mL                 PCT < 0.1 ng/mL     Stopping of antibiotics            Stopping of antibiotics       strongly encouraged.               strongly encouraged.    ----------------------------     ------------------------------       PCT level decrease by               PCT < 0.25 ng/mL       >= 80% from peak PCT       OR PCT 0.25 - 0.5 ng/mL          Stopping of antibiotics                                             encouraged.     Stopping of antibiotics           encouraged.    ----------------------------     ------------------------------       PCT level decrease by              PCT >= 0.25 ng/mL       < 80% from peak PCT        AND PCT >= 0.5 ng/mL             Continuing antibiotics  encouraged.       Continuing antibiotics            encouraged.    ----------------------------     ------------------------------     PCT level increase compared          PCT > 0.5 ng/mL         with peak PCT AND          PCT >= 0.5 ng/mL             Escalation of antibiotics                                          strongly encouraged.      Escalation of antibiotics        strongly encouraged.   Ammonia     Status: None   Collection Time: 10/25/2016  2:20 PM  Result  Value Ref Range   Ammonia 34 9 - 35 umol/L  CBG monitoring, ED     Status: Abnormal   Collection Time: 10/29/2016  2:37 PM  Result Value Ref Range   Glucose-Capillary 119 (H) 65 - 99 mg/dL  Culture, blood (routine x 2)     Status: None (Preliminary result)   Collection Time: 10/09/2016  3:06 PM  Result Value Ref Range   Specimen Description BLOOD BLOOD RIGHT HAND    Special Requests IN PEDIATRIC BOTTLE Blood Culture adequate volume    Culture  Setup Time      GRAM NEGATIVE RODS Organism ID to follow IN PEDIATRIC BOTTLE CRITICAL RESULT CALLED TO, READ BACK BY AND VERIFIED WITH: C. BALL PHARMD, AT 0707 10/12/16 BY D. VANHOOK    Culture GRAM NEGATIVE RODS    Report Status PENDING   Blood Culture ID Panel (Reflexed)     Status: Abnormal   Collection Time: 10/22/2016  3:06 PM  Result Value Ref Range   Enterococcus species NOT DETECTED NOT DETECTED   Listeria monocytogenes NOT DETECTED NOT DETECTED   Staphylococcus species NOT DETECTED NOT DETECTED   Staphylococcus aureus NOT DETECTED NOT DETECTED   Streptococcus species NOT DETECTED NOT DETECTED   Streptococcus agalactiae NOT DETECTED NOT DETECTED   Streptococcus pneumoniae NOT DETECTED NOT DETECTED   Streptococcus pyogenes NOT DETECTED NOT DETECTED   Acinetobacter baumannii NOT DETECTED NOT DETECTED   Enterobacteriaceae species DETECTED (A) NOT DETECTED    Comment: Enterobacteriaceae represent a large family of gram-negative bacteria, not a single organism. CRITICAL RESULT CALLED TO, READ BACK BY AND VERIFIED WITH: C. BALL PHARMD, AT 0707 10/12/16 BY D. VANHOOK    Enterobacter cloacae complex NOT DETECTED NOT DETECTED   Escherichia coli DETECTED (A) NOT DETECTED    Comment: CRITICAL RESULT CALLED TO, READ BACK BY AND VERIFIED WITH: C. BALL PHARMD, AT 0707 10/12/16 BY D. VANHOOK    Klebsiella oxytoca NOT DETECTED NOT DETECTED   Klebsiella pneumoniae NOT DETECTED NOT DETECTED   Proteus species NOT DETECTED NOT DETECTED   Serratia  marcescens NOT DETECTED NOT DETECTED   Carbapenem resistance NOT DETECTED NOT DETECTED   Haemophilus influenzae NOT DETECTED NOT DETECTED   Neisseria meningitidis NOT DETECTED NOT DETECTED   Pseudomonas aeruginosa NOT DETECTED NOT DETECTED   Candida albicans NOT DETECTED NOT DETECTED   Candida glabrata NOT DETECTED NOT DETECTED   Candida krusei NOT DETECTED NOT DETECTED   Candida parapsilosis NOT DETECTED NOT DETECTED   Candida tropicalis NOT  DETECTED NOT DETECTED  Lactic acid, plasma     Status: Abnormal   Collection Time: 10/17/2016  4:00 PM  Result Value Ref Range   Lactic Acid, Venous 3.0 (HH) 0.5 - 1.9 mmol/L    Comment: CRITICAL RESULT CALLED TO, READ BACK BY AND VERIFIED WITH: Kathlen Mody 1750 10/12/2016 WBOND   Lactic acid, plasma     Status: Abnormal   Collection Time: 10/06/2016  7:24 PM  Result Value Ref Range   Lactic Acid, Venous 2.7 (HH) 0.5 - 1.9 mmol/L    Comment: CRITICAL RESULT CALLED TO, READ BACK BY AND VERIFIED WITH: K DENNIS,RN 2015 10/16/2016 D BRADLEY   Cooxemetry Panel (carboxy, met, total hgb, O2 sat)     Status: Abnormal   Collection Time: 10/08/2016  7:44 PM  Result Value Ref Range   Total hemoglobin 9.2 (L) 12.0 - 16.0 g/dL   O2 Saturation 64.1 %   Carboxyhemoglobin 2.0 (H) 0.5 - 1.5 %   Methemoglobin 0.9 0.0 - 1.5 %  Glucose, capillary     Status: Abnormal   Collection Time: 11/01/2016  7:44 PM  Result Value Ref Range   Glucose-Capillary 132 (H) 65 - 99 mg/dL   Comment 1 Capillary Specimen    Comment 2 Notify RN    Comment 3 Document in Chart   Troponin I (q 6hr x 3)     Status: Abnormal   Collection Time: 10/23/2016  8:17 PM  Result Value Ref Range   Troponin I 0.46 (HH) <0.03 ng/mL    Comment: CRITICAL VALUE NOTED.  VALUE IS CONSISTENT WITH PREVIOUSLY REPORTED AND CALLED VALUE.  Glucose, capillary     Status: Abnormal   Collection Time: 11/02/2016 11:35 PM  Result Value Ref Range   Glucose-Capillary 179 (H) 65 - 99 mg/dL   Comment 1 Capillary Specimen     Comment 2 Notify RN    Comment 3 Document in Chart   Troponin I (q 6hr x 3)     Status: Abnormal   Collection Time: 10/12/16  3:04 AM  Result Value Ref Range   Troponin I 0.39 (HH) <0.03 ng/mL    Comment: CRITICAL VALUE NOTED.  VALUE IS CONSISTENT WITH PREVIOUSLY REPORTED AND CALLED VALUE.  Glucose, capillary     Status: Abnormal   Collection Time: 10/12/16  3:40 AM  Result Value Ref Range   Glucose-Capillary 179 (H) 65 - 99 mg/dL   Comment 1 Capillary Specimen    Comment 2 Notify RN    Comment 3 Document in Chart   Basic metabolic panel     Status: Abnormal   Collection Time: 10/12/16  4:54 AM  Result Value Ref Range   Sodium 126 (L) 135 - 145 mmol/L   Potassium 4.0 3.5 - 5.1 mmol/L   Chloride 93 (L) 101 - 111 mmol/L   CO2 22 22 - 32 mmol/L   Glucose, Bld 167 (H) 65 - 99 mg/dL   BUN 56 (H) 6 - 20 mg/dL   Creatinine, Ser 2.56 (H) 0.44 - 1.00 mg/dL   Calcium 7.2 (L) 8.9 - 10.3 mg/dL   GFR calc non Af Amer 18 (L) >60 mL/min   GFR calc Af Amer 21 (L) >60 mL/min    Comment: (NOTE) The eGFR has been calculated using the CKD EPI equation. This calculation has not been validated in all clinical situations. eGFR's persistently <60 mL/min signify possible Chronic Kidney Disease.    Anion gap 11 5 - 15  CBC     Status:  Abnormal   Collection Time: 10/12/16  4:54 AM  Result Value Ref Range   WBC 2.9 (L) 4.0 - 10.5 K/uL   RBC 2.84 (L) 3.87 - 5.11 MIL/uL   Hemoglobin 7.6 (L) 12.0 - 15.0 g/dL    Comment: DELTA CHECK NOTED REPEATED TO VERIFY    HCT 22.8 (L) 36.0 - 46.0 %   MCV 80.3 78.0 - 100.0 fL   MCH 26.8 26.0 - 34.0 pg   MCHC 33.3 30.0 - 36.0 g/dL   RDW 17.2 (H) 11.5 - 15.5 %   Platelets 278 150 - 400 K/uL  Magnesium     Status: Abnormal   Collection Time: 10/12/16  4:54 AM  Result Value Ref Range   Magnesium 1.5 (L) 1.7 - 2.4 mg/dL  Phosphorus     Status: Abnormal   Collection Time: 10/12/16  4:54 AM  Result Value Ref Range   Phosphorus 4.8 (H) 2.5 - 4.6 mg/dL   Procalcitonin     Status: None   Collection Time: 10/12/16  4:54 AM  Result Value Ref Range   Procalcitonin 32.02 ng/mL    Comment:        Interpretation: PCT >= 10 ng/mL: Important systemic inflammatory response, almost exclusively due to severe bacterial sepsis or septic shock. (NOTE)         ICU PCT Algorithm               Non ICU PCT Algorithm    ----------------------------     ------------------------------         PCT < 0.25 ng/mL                 PCT < 0.1 ng/mL     Stopping of antibiotics            Stopping of antibiotics       strongly encouraged.               strongly encouraged.    ----------------------------     ------------------------------       PCT level decrease by               PCT < 0.25 ng/mL       >= 80% from peak PCT       OR PCT 0.25 - 0.5 ng/mL          Stopping of antibiotics                                             encouraged.     Stopping of antibiotics           encouraged.    ----------------------------     ------------------------------       PCT level decrease by              PCT >= 0.25 ng/mL       < 80% from peak PCT        AND PCT >= 0.5 ng/mL             Continuing antibiotics                                              encouraged.       Continuing antibiotics  encouraged.    ----------------------------     ------------------------------     PCT level increase compared          PCT > 0.5 ng/mL         with peak PCT AND          PCT >= 0.5 ng/mL             Escalation of antibiotics                                          strongly encouraged.      Escalation of antibiotics        strongly encouraged.   Cooxemetry Panel (carboxy, met, total hgb, O2 sat)     Status: Abnormal   Collection Time: 10/12/16  5:22 AM  Result Value Ref Range   Total hemoglobin 7.7 (L) 12.0 - 16.0 g/dL   O2 Saturation 67.5 %   Carboxyhemoglobin 1.6 (H) 0.5 - 1.5 %   Methemoglobin 1.0 0.0 - 1.5 %    Imaging / Studies: Ct Abdomen Pelvis Wo  Contrast  Result Date: 10/03/2016 CLINICAL DATA:  Diffuse abdominal pain with distention. Status post laparoscopic bilateral inguinal, bilateral obturator, and umbilical hernia repair with mesh on 09/28/2016. EXAM: CT ABDOMEN AND PELVIS WITHOUT CONTRAST TECHNIQUE: Multidetector CT imaging of the abdomen and pelvis was performed following the standard protocol without IV contrast. COMPARISON:  08/27/2015 FINDINGS: Lower chest: Heart is markedly enlarged. There is compressive atelectasis in the lower lungs. Probable small volume fluid in the right major fissure. Hepatobiliary: Insert normal noncontrast liver gallbladder not well seen. No intrahepatic or extrahepatic biliary dilation. Pancreas: No focal mass lesion. No dilatation of the main duct. No intraparenchymal cyst. No peripancreatic edema. Spleen: No splenomegaly. No focal mass lesion. Adrenals/Urinary Tract: No adrenal nodule or mass. No stones seen in either kidney. No hydronephrosis. The urinary bladder appears normal for the degree of distention. Stomach/Bowel: Stomach not well seen. No small bowel dilatation. No colonic dilatation. Vascular/Lymphatic: There is abdominal aortic atherosclerosis without aneurysm. Limited assessment for intraperitoneal lymphadenopathy given congestion, edema, fluid, and lack of intravenous contrast material. Reproductive: Fibroid change noted in the uterus. No definite adnexal mass. Other: Moderate volume ascites. Musculoskeletal: Fluid collection is identified deep to the rectus sheath, starting just above the umbilicus and tracking caudally down towards the symphysis pubis. This is extraperitoneal. There is another fluid collection on the antro lateral right lower abdominal wall the contains debris and scattered gas bubbles (see image 34 series 6 and image 51 series 6). This does not appear to represent gas within bowel and could be gas with an and hematoma raising the question of superinfection. 6 x 9 cm fluid collection  identified in the left groin. 9 x 10 cm heterogeneous fluid collection identified in the right groin.Bone windows reveal no worrisome lytic or sclerotic osseous lesions. IMPRESSION: 1. Moderate volume ascites. 2. Interval development of an apparent extraperitoneal fluid collection tracking deep to the rectus sheath beginning just cranial to the umbilicus and tracking down towards the symphysis pubis. Given the history of surgery in this region, postoperative seroma/hematoma would be a consideration. Infection not excluded. 3. Fluid collection tracking along the anterolateral right lower abdominal wall appears to be extraperitoneal although this is difficult to evaluate given the lack of intravenous and oral contrast. Gas contained within this collection does not appear consistent with an intraluminal  location raising the question of gas within a hematoma or seroma. As such, superinfection of this collection is a concern. 4. Bilateral fluid collections identified in the groin regions. Patient is status post recent bilateral groin hernia repair and imaging features are compatible with recurrence. The collection in the right groin areas heterogeneous and may represent clot. No definite bowel within the groin collections. 5. Moderate ascites. 6. Marked cardiomegaly Electronically Signed   By: Misty Stanley M.D.   On: 10/30/2016 13:27   Dg Chest Port 1 View  Result Date: 10/21/2016 CLINICAL DATA:  Central line placement. EXAM: PORTABLE CHEST 1 VIEW COMPARISON:  10/24/2016. FINDINGS: Right IJ central line tip projects at the SVC RA junction. Single lead ICD device projects over the right ventricle. Heart is moderately enlarged. Lungs are somewhat low in volume with bibasilar atelectasis. No definite pleural fluid. No pneumothorax. IMPRESSION: 1. Right IJ central line tip projects at the SVC RA junction. No pneumothorax. 2. Bibasilar atelectasis. Electronically Signed   By: Lorin Picket M.D.   On: 10/23/2016 16:41    Dg Chest Portable 1 View  Result Date: 11/01/2016 CLINICAL DATA:  Hypotension EXAM: PORTABLE CHEST 1 VIEW COMPARISON:  09/30/2016 FINDINGS: Chronic marked cardiopericardial enlargement. Poorly visualized bases: There is atelectasis on contemporaneous abdominal CT. No evidence of edema, effusion, or pneumothorax. Single chamber ICD/ pacer into the right ventricle. IMPRESSION: 1. Bibasilar atelectasis. 2. Chronic marked cardiomegaly. Electronically Signed   By: Monte Fantasia M.D.   On: 10/17/2016 13:51    Medications / Allergies: per chart  Antibiotics: Anti-infectives    Start     Dose/Rate Route Frequency Ordered Stop   10/13/16 0000  vancomycin (VANCOCIN) IVPB 750 mg/150 ml premix     750 mg 150 mL/hr over 60 Minutes Intravenous Every 48 hours 10/30/2016 1512     10/16/2016 1515  clindamycin (CLEOCIN) IVPB 600 mg     600 mg 100 mL/hr over 30 Minutes Intravenous Every 8 hours 10/08/2016 1504     10/20/2016 1515  piperacillin-tazobactam (ZOSYN) IVPB 2.25 g     2.25 g 100 mL/hr over 30 Minutes Intravenous Every 8 hours 11/01/2016 1512     10/16/2016 1515  vancomycin (VANCOCIN) 1,250 mg in sodium chloride 0.9 % 250 mL IVPB     1,250 mg 166.7 mL/hr over 90 Minutes Intravenous  Once 10/08/2016 1512 10/07/2016 1836        Note: Portions of this report may have been transcribed using voice recognition software. Every effort was made to ensure accuracy; however, inadvertent computerized transcription errors may be present.   Any transcriptional errors that result from this process are unintentional.     Adin Hector, M.D., F.A.C.S. Gastrointestinal and Minimally Invasive [facility] [address] [place], [place] [address] [phone] Main / Paging   10/12/2016

## 2016-10-12 NOTE — Progress Notes (Signed)
Patient is tolerating bipap well at this time. RT will continue to monitor as needed.

## 2016-10-12 NOTE — Progress Notes (Signed)
  Echocardiogram 2D Echocardiogram has been performed.  Sheri Dunn, Sheri Dunn 10/12/2016, 12:51 PM

## 2016-10-13 ENCOUNTER — Inpatient Hospital Stay (HOSPITAL_COMMUNITY): Payer: Medicare HMO

## 2016-10-13 DIAGNOSIS — Z95810 Presence of automatic (implantable) cardiac defibrillator: Secondary | ICD-10-CM

## 2016-10-13 DIAGNOSIS — N179 Acute kidney failure, unspecified: Secondary | ICD-10-CM

## 2016-10-13 DIAGNOSIS — I484 Atypical atrial flutter: Secondary | ICD-10-CM

## 2016-10-13 DIAGNOSIS — I5042 Chronic combined systolic (congestive) and diastolic (congestive) heart failure: Secondary | ICD-10-CM

## 2016-10-13 DIAGNOSIS — I351 Nonrheumatic aortic (valve) insufficiency: Secondary | ICD-10-CM

## 2016-10-13 DIAGNOSIS — T792XXD Traumatic secondary and recurrent hemorrhage and seroma, subsequent encounter: Secondary | ICD-10-CM

## 2016-10-13 DIAGNOSIS — I2510 Atherosclerotic heart disease of native coronary artery without angina pectoris: Secondary | ICD-10-CM

## 2016-10-13 DIAGNOSIS — T888XXD Other specified complications of surgical and medical care, not elsewhere classified, subsequent encounter: Secondary | ICD-10-CM

## 2016-10-13 DIAGNOSIS — Z9861 Coronary angioplasty status: Secondary | ICD-10-CM

## 2016-10-13 LAB — CBC
HEMATOCRIT: 21.5 % — AB (ref 36.0–46.0)
HEMOGLOBIN: 7.2 g/dL — AB (ref 12.0–15.0)
MCH: 26.4 pg (ref 26.0–34.0)
MCHC: 33.5 g/dL (ref 30.0–36.0)
MCV: 78.8 fL (ref 78.0–100.0)
Platelets: 222 10*3/uL (ref 150–400)
RBC: 2.73 MIL/uL — AB (ref 3.87–5.11)
RDW: 17 % — ABNORMAL HIGH (ref 11.5–15.5)
WBC: 8 10*3/uL (ref 4.0–10.5)

## 2016-10-13 LAB — HEPATIC FUNCTION PANEL
ALBUMIN: 2.2 g/dL — AB (ref 3.5–5.0)
ALT: 14 U/L (ref 14–54)
AST: 25 U/L (ref 15–41)
Alkaline Phosphatase: 52 U/L (ref 38–126)
BILIRUBIN DIRECT: 1.3 mg/dL — AB (ref 0.1–0.5)
BILIRUBIN TOTAL: 2.2 mg/dL — AB (ref 0.3–1.2)
Indirect Bilirubin: 0.9 mg/dL (ref 0.3–0.9)
Total Protein: 5.5 g/dL — ABNORMAL LOW (ref 6.5–8.1)

## 2016-10-13 LAB — BASIC METABOLIC PANEL
ANION GAP: 15 (ref 5–15)
Anion gap: 14 (ref 5–15)
BUN: 57 mg/dL — AB (ref 6–20)
BUN: 57 mg/dL — AB (ref 6–20)
CHLORIDE: 90 mmol/L — AB (ref 101–111)
CHLORIDE: 92 mmol/L — AB (ref 101–111)
CO2: 21 mmol/L — ABNORMAL LOW (ref 22–32)
CO2: 18 mmol/L — ABNORMAL LOW (ref 22–32)
CREATININE: 2.17 mg/dL — AB (ref 0.44–1.00)
Calcium: 7.4 mg/dL — ABNORMAL LOW (ref 8.9–10.3)
Calcium: 7.6 mg/dL — ABNORMAL LOW (ref 8.9–10.3)
Creatinine, Ser: 1.94 mg/dL — ABNORMAL HIGH (ref 0.44–1.00)
GFR calc Af Amer: 26 mL/min — ABNORMAL LOW (ref >60)
GFR calc Af Amer: 30 mL/min — ABNORMAL LOW (ref >60)
GFR calc non Af Amer: 22 mL/min — ABNORMAL LOW (ref >60)
GFR, EST NON AFRICAN AMERICAN: 26 mL/min — AB (ref >60)
GLUCOSE: 140 mg/dL — AB (ref 65–99)
Glucose, Bld: 89 mg/dL (ref 65–99)
POTASSIUM: 3.5 mmol/L (ref 3.5–5.1)
POTASSIUM: 3.8 mmol/L (ref 3.5–5.1)
SODIUM: 125 mmol/L — AB (ref 135–145)
Sodium: 125 mmol/L — ABNORMAL LOW (ref 135–145)

## 2016-10-13 LAB — URINE CULTURE
Culture: NO GROWTH
Special Requests: NORMAL

## 2016-10-13 LAB — GLUCOSE, CAPILLARY
GLUCOSE-CAPILLARY: 121 mg/dL — AB (ref 65–99)
GLUCOSE-CAPILLARY: 87 mg/dL (ref 65–99)
GLUCOSE-CAPILLARY: 116 mg/dL — AB (ref 65–99)
GLUCOSE-CAPILLARY: 142 mg/dL — AB (ref 65–99)
Glucose-Capillary: 94 mg/dL (ref 65–99)
Glucose-Capillary: 136 mg/dL — ABNORMAL HIGH (ref 65–99)

## 2016-10-13 LAB — HEPARIN LEVEL (UNFRACTIONATED): Heparin Unfractionated: <0.1 IU/mL — ABNORMAL LOW (ref 0.30–0.70)

## 2016-10-13 LAB — PHOSPHORUS: Phosphorus: 5.3 mg/dL — ABNORMAL HIGH (ref 2.5–4.6)

## 2016-10-13 LAB — MAGNESIUM: MAGNESIUM: 1.9 mg/dL (ref 1.7–2.4)

## 2016-10-13 LAB — TROPONIN I
TROPONIN I: 0.27 ng/mL — AB (ref <0.03)
TROPONIN I: 0.23 ng/mL — AB (ref <0.03)
Troponin I: 0.2 ng/mL (ref <0.03)

## 2016-10-13 LAB — COOXEMETRY PANEL
Carboxyhemoglobin: 1.4 % (ref 0.5–1.5)
METHEMOGLOBIN: 0.8 % (ref 0.0–1.5)
O2 SAT: 78.1 %
Total hemoglobin: 7.3 g/dL — ABNORMAL LOW (ref 12.0–16.0)

## 2016-10-13 LAB — HEMOGLOBIN AND HEMATOCRIT, BLOOD
HCT: 23.9 % — ABNORMAL LOW (ref 36.0–46.0)
Hemoglobin: 8.2 g/dL — ABNORMAL LOW (ref 12.0–15.0)

## 2016-10-13 LAB — PREPARE RBC (CROSSMATCH)

## 2016-10-13 LAB — PROCALCITONIN: Procalcitonin: 27.38 ng/mL

## 2016-10-13 MED ORDER — FUROSEMIDE 10 MG/ML IJ SOLN
40.0000 mg | Freq: Two times a day (BID) | INTRAMUSCULAR | Status: AC
  Administered 2016-10-13 (×2): 40 mg via INTRAVENOUS
  Filled 2016-10-13 (×2): qty 4

## 2016-10-13 MED ORDER — ENOXAPARIN SODIUM 30 MG/0.3ML ~~LOC~~ SOLN
30.0000 mg | SUBCUTANEOUS | Status: DC
  Administered 2016-10-13: 30 mg via SUBCUTANEOUS
  Filled 2016-10-13: qty 0.3

## 2016-10-13 MED ORDER — ENOXAPARIN SODIUM 30 MG/0.3ML ~~LOC~~ SOLN: 30.0000 mg | Freq: Two times a day (BID) | SUBCUTANEOUS | Status: DC

## 2016-10-13 MED ORDER — HEPARIN (PORCINE) IN NACL 100-0.45 UNIT/ML-% IJ SOLN
900.0000 [IU]/h | INTRAMUSCULAR | Status: DC
  Administered 2016-10-13: 700 [IU]/h via INTRAVENOUS
  Administered 2016-10-14: 1100 [IU]/h via INTRAVENOUS
  Administered 2016-10-15: 1250 [IU]/h via INTRAVENOUS
  Administered 2016-10-16: 1150 [IU]/h via INTRAVENOUS
  Administered 2016-10-17: 900 [IU]/h via INTRAVENOUS
  Filled 2016-10-13 (×5): qty 250

## 2016-10-13 MED ORDER — POTASSIUM CHLORIDE 10 MEQ/50ML IV SOLN
10.0000 meq | INTRAVENOUS | Status: AC
  Administered 2016-10-13 (×4): 10 meq via INTRAVENOUS
  Filled 2016-10-13 (×4): qty 50

## 2016-10-13 MED ORDER — METOPROLOL TARTRATE 5 MG/5ML IV SOLN
5.0000 mg | Freq: Once | INTRAVENOUS | Status: AC
  Administered 2016-10-13: 5 mg via INTRAVENOUS
  Filled 2016-10-13: qty 5

## 2016-10-13 MED ORDER — CARVEDILOL 3.125 MG PO TABS
3.1250 mg | ORAL_TABLET | Freq: Two times a day (BID) | ORAL | Status: DC
  Filled 2016-10-13 (×2): qty 1

## 2016-10-13 MED ORDER — SODIUM CHLORIDE 0.9 % IV SOLN: Freq: Once | INTRAVENOUS | Status: DC

## 2016-10-13 NOTE — Progress Notes (Signed)
eLink Physician-Brief Progress Note Patient Name: Sheri Dunn DOB: 1951-04-06 MRN: [id number]   Date of Service  10/13/2016  HPI/Events of Note  Pt appears to have a rhythm change - I reviewed ECG and looks like A flutter at 120. I will defer anticoagulation at this time given concern for abdominal wall bleed. Check serial troponin  eICU Interventions       Intervention Category Major Interventions: Arrhythmia - evaluation and management  Shylynn Bruning S. 10/13/2016, 4:39 AM

## 2016-10-13 NOTE — Care Management Note (Signed)
Case Management Note Donn PieriniKristi Clemente Dewey RN, BSN Unit 2W-Case Manager-- 2H coverage [phone]  Patient Details  Name: Sheri Dunn MRN: [id number] Date of Birth: Nov 29, 1950  Subjective/Objective:   Pt admitted with STEMI s/p cath,  Sepsis,   Levophed + milrinone              Action/Plan: PTA pt lived at home, CM to follow for d/c needs  Expected Discharge Date:                  Expected Discharge Plan:  Home w Home Health Services  In-House Referral:     Discharge planning Services  CM Consult  Post Acute Care Choice:    Choice offered to:     DME Arranged:    DME Agency:     HH Arranged:    HH Agency:     Status of Service:  In process, will continue to follow  If discussed at Long Length of Stay Meetings, dates discussed:    Discharge Disposition:   Additional Comments:  Darrold SpanWebster, Louna Rothgeb Hall, RN 10/13/2016, 12:04 PM

## 2016-10-13 NOTE — Progress Notes (Signed)
[place]  [address] [place], [place] [address] Phone: [phone] FAX: [phone]   Sheri Dunn [id number] 1951-05-07    Problem List:   Principal Problem:   Cardiogenic shock [facility]) Active Problems:   Ischemic cardiomyopathy   Cirrhosis, nonalcoholic   Poorly controlled ascites   Elevated troponin   CAD S/P PCI April 2016   Aortic insufficiency   Chronic combined systolic and diastolic CHF (congestive heart failure) ([place])   ICD (implantable cardioverter-defibrillator) in place   Hypertensive heart disease   Bilateral inguinal hernias s/p lap repair with mesh 09/28/2016   Obturator hernias, bilateral, s/p lap repair with mesh 3/33/8329   Umbilical hernia s/p lap repair with mesh 09/28/2016   Constipation, chronic   Acute ST elevation myocardial infarction (STEMI) of inferior wall (HCC)   BLQ abdominal wall old hernia sac seromas - LARGE   Septic shock (HCC)   Pressure injury of skin   Abdominal pain   Acute pulmonary edema (HCC)   AKI (acute kidney injury) ([place])   2 Days Post-Op  09/28/2016  POST-OPERATIVE DIAGNOSIS:    Bilateral inguinal hernias Bilateral obturator hernias Umbilical hernia LLQ seromas  PROCEDURE:    LAPAROSCOPIC BILATERAL INGUINAL HERNIA REPAIRS WITH MESH LAPAROSCOPIC BILATERAL OBTURATOR HERNIA REPAIRS WITH MESH LAPAROSCOPIC UMBILICAL HERNIA REPAIR WITH MESH Needle aspiration of seroma INSERTION OF MESH  SURGEON:  Adin Hector, MD    Assessment  GUARDED with shock, ARF, resp failure but no evidence of abdominal infection  Plan:  -Worsening ARF - concerning for ARF with attempts at diuresis.  Nephrology consult.  Dr Mercy Moore reached.  Nephrology to see. ?CVV[facility].  Expect poor prognosis with worsening ARF & chronic CHF & cirrhosis  -See if cardiology can revisit the patient.  -OK to anticoagulate from surgery standpoint  -PO as tolerated   Appropriate postop pain s/p lap  VWH & giant BIH repairs with mesh  NO EVIDENCE OF PERITONITIS.  Same abd soreness (mild) as noted last week.  Mild & mostly controlled with tylenol & heat  Seromas/hematomas in preperitoneal space & former large hernia sacs expected.  Large seromas at [facility] & R inguinal region at site of former hernias expected in pt with massive ascites. Increased slightly with increase in abdominal disention most likely from worsening ascites.  Doubt infection.   Hernia sacs filled up w ascites fluid.  Not incarcerated.  Not infected.  Soft.  Stable.  Have not been redicible since POD#6.  DO NOT SAMPLE FLUID around mesh: Increases risk of mesh infection & ascites leak markedly.  OK to tap / paracentesis as needed for ascites relief.  Try to do in upper abdomen away from periumbilical & infraumbilical mesh.  I would not do IV ABx for surgical needs but understand starting for shock protocol & regrouping.  -bowel regimen for constipation.  Miralax   -Diuresis with severe CHF, cirrhosis & chronic ascites  -TED hose for BLE - defer to cardiology     Adin Hector, M.D., F.A.C.S. Gastrointestinal and Minimally Invasive Surgery [facility] [address] [place], [place] [address] [phone] Main / Paging   10/13/2016  CARE TEAM:  PCP: [name], MD  Outpatient Care Team: Patient Care Team: [name], MD as PCP - General (Internal Medicine) Croitoru, Dani Gobble, MD as Consulting Physician (Cardiology) Michael Boston, MD as Consulting Physician (General Surgery)  Inpatient Treatment Team: Treatment Team: Attending Provider: Rush Farmer, MD; Consulting Physician: Michael Boston, MD;  Rounding Team: Pccm, Md, MD; Rounding Team: Lbcardiology, Michae Kava, MD; Consulting Physician: Sanda Klein, MD; Registered Nurse: Blondell Reveal, RN; Registered Nurse: Reola Mosher, Janine Ores, RN; Technician: Tera Mater, [place]; Consulting Physician: Wellington Hampshire,  MD  Subjective:  Afib More resp distress - on CPAP Denies abd pain Abd & hernia sac more swollen  Objective:  Vital signs:  Vitals:   10/13/16 0300 10/13/16 0400 10/13/16 0500 10/13/16 0600  BP: (!) 120/100 124/68 107/70 121/72  Pulse: (!) 120 (!) 120 (!) 121 (!) 121  Resp: (!) 29 (!) 30 (!) 26 (!) 27  Temp:  98.1 F (36.7 C)    TempSrc:  Axillary    SpO2: 99% 99% 99% 98%  Weight:    59 kg (130 lb)  Height:        Last BM Date: 10/10/16  Intake/Output   Yesterday:  05/10 0701 - 05/11 0700 In: 2055.4 [P.O.:960; I.V.:945.4; IV Piggyback:150] Out: 7169 [Urine:1210] This shift:  Total I/O In: 345.4 [I.V.:345.4] Out: 775 [Urine:775]  Bowel function:  Flatus: YES  BM:  No  Drain: (No drain)   Physical Exam:  General: Pt awake/alert/oriented x4 in no acute distress.  Head on hand bored, then perks up when seeing myself & the RNs.  Chatty.   Eyes: PERRL, normal EOM.  Sclera clear.  No icterus Neuro: CN II-XII intact w/o focal sensory/motor deficits. Lymph: No head/neck/groin lymphadenopathy Psych:  No delerium/psychosis/paranoia.  Calm.  Not confused.  Chatty HENT: Normocephalic, Mucus membranes moist.  No thrush.  ETT in place Neck: Supple, No tracheal deviation Chest: Dec BS at bases.  No chest wall pain w good excursion CV:  Pulses intact.  Regular rhythm MS: Normal AROM mjr joints.  No obvious deformity  Abdomen: Somewhat firm.  Moderately distended. Chronic swelling consistent with chronic ascites  Nontender  Mod pannicular edema.  Incisions c/d/I.  DRY.    Old hernia sacs mildly increased.  R groin smooth & large.  Soft.  No fluctuance/warmth.  LLQ SQ seroma fluid soft. No pain/fluctuance.  No pain with cough.  No pain with bed shake.  No percussive tenderness.  No rebound tenderness. No guarding.  No evidence of peritonitis.    Ext:  Pitting edema to knees.  No cyanosis Skin: No petechiae / purpura  Results:   Labs: Results for orders placed or  performed during the hospital encounter of 10/10/2016 (from the past 48 hour(s))  I-stat troponin, ED     Status: Abnormal   Collection Time: 10/08/2016 12:04 PM  Result Value Ref Range   Troponin i, poc 0.88 (HH) 0.00 - 0.08 ng/mL   Comment NOTIFIED PHYSICIAN    Comment 3            Comment: Due to the release kinetics of cTnI, a negative result within the first hours of the onset of symptoms does not rule out myocardial infarction with certainty. If myocardial infarction is still suspected, repeat the test at appropriate intervals.   CBC with Differential/Platelet     Status: Abnormal   Collection Time: 10/07/2016 12:08 PM  Result Value Ref Range   WBC 3.9 (L) 4.0 - 10.5 K/uL    Comment: REPEATED TO VERIFY   RBC 3.55 (L) 3.87 - 5.11 MIL/uL   Hemoglobin 9.8 (L) 12.0 - 15.0 g/dL    Comment: REPEATED TO VERIFY   HCT 29.0 (L) 36.0 - 46.0 %   MCV 81.7 78.0 - 100.0 fL   MCH 27.6 26.0 - 34.0  pg   MCHC 33.8 30.0 - 36.0 g/dL   RDW 17.3 (H) 11.5 - 15.5 %   Platelets 311 150 - 400 K/uL   Neutrophils Relative % 23 %   Lymphocytes Relative 21 %   Monocytes Relative 9 %   Eosinophils Relative 0 %   Basophils Relative 0 %   Band Neutrophils 40 %   Metamyelocytes Relative 5 %   Myelocytes 0 %   Promyelocytes Absolute 0 %   Blasts 0 %   nRBC 1 (H) 0 /100 WBC   Other 2 %   Neutro Abs 2.7 1.7 - 7.7 K/uL   Lymphs Abs 0.8 0.7 - 4.0 K/uL   Monocytes Absolute 0.4 0.1 - 1.0 K/uL   Eosinophils Absolute 0.0 0.0 - 0.7 K/uL   Basophils Absolute 0.0 0.0 - 0.1 K/uL   RBC Morphology BURR CELLS     Comment: TARGET CELLS   WBC Morphology INCREASED BANDS (>20% BANDS)     Comment: ATYPICAL LYMPHOCYTES  Protime-INR     Status: Abnormal   Collection Time: 10/23/2016 12:37 PM  Result Value Ref Range   Prothrombin Time 21.2 (H) 11.4 - 15.2 seconds   INR 1.80   APTT     Status: Abnormal   Collection Time: 10/06/2016 12:37 PM  Result Value Ref Range   aPTT 91 (H) 24 - 36 seconds    Comment:        IF  BASELINE aPTT IS ELEVATED, SUGGEST PATIENT RISK ASSESSMENT BE USED TO DETERMINE APPROPRIATE ANTICOAGULANT THERAPY.   Comprehensive metabolic panel     Status: Abnormal   Collection Time: 10/06/2016 12:37 PM  Result Value Ref Range   Sodium 132 (L) 135 - 145 mmol/L   Potassium 4.6 3.5 - 5.1 mmol/L   Chloride 98 (L) 101 - 111 mmol/L   CO2 17 (L) 22 - 32 mmol/L   Glucose, Bld 43 (LL) 65 - 99 mg/dL    Comment: CRITICAL RESULT CALLED TO, READ BACK BY AND VERIFIED WITH: G.NIKOLICH,RN 4854 11/04/68 CLARK,S    BUN 53 (H) 6 - 20 mg/dL   Creatinine, Ser 2.63 (H) 0.44 - 1.00 mg/dL   Calcium 8.0 (L) 8.9 - 10.3 mg/dL   Total Protein 5.8 (L) 6.5 - 8.1 g/dL   Albumin 2.5 (L) 3.5 - 5.0 g/dL   AST 31 15 - 41 U/L   ALT 14 14 - 54 U/L   Alkaline Phosphatase 62 38 - 126 U/L   Total Bilirubin 2.4 (H) 0.3 - 1.2 mg/dL   GFR calc non Af Amer 18 (L) >60 mL/min   GFR calc Af Amer 21 (L) >60 mL/min    Comment: (NOTE) The eGFR has been calculated using the CKD EPI equation. This calculation has not been validated in all clinical situations. eGFR's persistently <60 mL/min signify possible Chronic Kidney Disease.    Anion gap 17 (H) 5 - 15  Lipid panel     Status: Abnormal   Collection Time: 10/20/2016 12:37 PM  Result Value Ref Range   Cholesterol 41 0 - 200 mg/dL   Triglycerides 83 <150 mg/dL   HDL 11 (L) >40 mg/dL   Total CHOL/HDL Ratio 3.7 RATIO   VLDL 17 0 - 40 mg/dL   LDL Cholesterol 13 0 - 99 mg/dL    Comment:        Total Cholesterol/HDL:CHD Risk Coronary Heart Disease Risk Table  Men   Women  1/2 Average Risk   3.4   3.3  Average Risk       5.0   4.4  2 X Average Risk   9.6   7.1  3 X Average Risk  23.4   11.0        Use the calculated Patient Ratio above and the CHD Risk Table to determine the patient's CHD Risk.        ATP III CLASSIFICATION (LDL):  <100     mg/dL   Optimal  100-129  mg/dL   Near or Above                    Optimal  130-159  mg/dL   Borderline   160-189  mg/dL   High  >190     mg/dL   Very High   I-Stat CG4 Lactic Acid, ED     Status: Abnormal   Collection Time: 10/09/2016  1:25 PM  Result Value Ref Range   Lactic Acid, Venous 5.27 (HH) 0.5 - 1.9 mmol/L   Comment NOTIFIED PHYSICIAN   Troponin I (q 6hr x 3)     Status: Abnormal   Collection Time: 10/14/2016  2:17 PM  Result Value Ref Range   Troponin I 0.43 (HH) <0.03 ng/mL    Comment: CRITICAL RESULT CALLED TO, READ BACK BY AND VERIFIED WITH: Kathlen Mody 1800 10/12/2016 WBOND   Procalcitonin - Baseline     Status: None   Collection Time: 10/10/2016  2:17 PM  Result Value Ref Range   Procalcitonin 37.31 ng/mL    Comment:        Interpretation: PCT >= 10 ng/mL: Important systemic inflammatory response, almost exclusively due to severe bacterial sepsis or septic shock. (NOTE)         ICU PCT Algorithm               Non ICU PCT Algorithm    ----------------------------     ------------------------------         PCT < 0.25 ng/mL                 PCT < 0.1 ng/mL     Stopping of antibiotics            Stopping of antibiotics       strongly encouraged.               strongly encouraged.    ----------------------------     ------------------------------       PCT level decrease by               PCT < 0.25 ng/mL       >= 80% from peak PCT       OR PCT 0.25 - 0.5 ng/mL          Stopping of antibiotics                                             encouraged.     Stopping of antibiotics           encouraged.    ----------------------------     ------------------------------       PCT level decrease by              PCT >= 0.25 ng/mL       < 80% from peak  PCT        AND PCT >= 0.5 ng/mL             Continuing antibiotics                                              encouraged.       Continuing antibiotics            encouraged.    ----------------------------     ------------------------------     PCT level increase compared          PCT > 0.5 ng/mL         with peak PCT AND           PCT >= 0.5 ng/mL             Escalation of antibiotics                                          strongly encouraged.      Escalation of antibiotics        strongly encouraged.   Ammonia     Status: None   Collection Time: 10/21/2016  2:20 PM  Result Value Ref Range   Ammonia 34 9 - 35 umol/L  CBG monitoring, ED     Status: Abnormal   Collection Time: 11/01/2016  2:37 PM  Result Value Ref Range   Glucose-Capillary 119 (H) 65 - 99 mg/dL  Culture, blood (routine x 2)     Status: Abnormal (Preliminary result)   Collection Time: 10/06/2016  3:06 PM  Result Value Ref Range   Specimen Description BLOOD BLOOD RIGHT HAND    Special Requests IN PEDIATRIC BOTTLE Blood Culture adequate volume    Culture  Setup Time      GRAM NEGATIVE RODS IN PEDIATRIC BOTTLE CRITICAL RESULT CALLED TO, READ BACK BY AND VERIFIED WITH: C. BALL PHARMD, AT 0707 10/12/16 BY D. VANHOOK    Culture (A)     ESCHERICHIA COLI CULTURE REINCUBATED FOR BETTER GROWTH    Report Status PENDING   Blood Culture ID Panel (Reflexed)     Status: Abnormal   Collection Time: 10/26/2016  3:06 PM  Result Value Ref Range   Enterococcus species NOT DETECTED NOT DETECTED   Listeria monocytogenes NOT DETECTED NOT DETECTED   Staphylococcus species NOT DETECTED NOT DETECTED   Staphylococcus aureus NOT DETECTED NOT DETECTED   Streptococcus species NOT DETECTED NOT DETECTED   Streptococcus agalactiae NOT DETECTED NOT DETECTED   Streptococcus pneumoniae NOT DETECTED NOT DETECTED   Streptococcus pyogenes NOT DETECTED NOT DETECTED   Acinetobacter baumannii NOT DETECTED NOT DETECTED   Enterobacteriaceae species DETECTED (A) NOT DETECTED    Comment: Enterobacteriaceae represent a large family of gram-negative bacteria, not a single organism. CRITICAL RESULT CALLED TO, READ BACK BY AND VERIFIED WITH: C. BALL PHARMD, AT 0707 10/12/16 BY D. VANHOOK    Enterobacter cloacae complex NOT DETECTED NOT DETECTED   Escherichia coli DETECTED (A) NOT DETECTED     Comment: CRITICAL RESULT CALLED TO, READ BACK BY AND VERIFIED WITH: C. BALL PHARMD, AT 3536 10/12/16 BY D. VANHOOK    Klebsiella oxytoca NOT DETECTED NOT DETECTED   Klebsiella pneumoniae NOT DETECTED NOT DETECTED   Proteus species NOT DETECTED NOT DETECTED  Serratia marcescens NOT DETECTED NOT DETECTED   Carbapenem resistance NOT DETECTED NOT DETECTED   Haemophilus influenzae NOT DETECTED NOT DETECTED   Neisseria meningitidis NOT DETECTED NOT DETECTED   Pseudomonas aeruginosa NOT DETECTED NOT DETECTED   Candida albicans NOT DETECTED NOT DETECTED   Candida glabrata NOT DETECTED NOT DETECTED   Candida krusei NOT DETECTED NOT DETECTED   Candida parapsilosis NOT DETECTED NOT DETECTED   Candida tropicalis NOT DETECTED NOT DETECTED  Lactic acid, plasma     Status: Abnormal   Collection Time: 10/04/2016  4:00 PM  Result Value Ref Range   Lactic Acid, Venous 3.0 (HH) 0.5 - 1.9 mmol/L    Comment: CRITICAL RESULT CALLED TO, READ BACK BY AND VERIFIED WITH: Kathlen Mody 1750 10/16/2016 WBOND   Culture, blood (routine x 2)     Status: None (Preliminary result)   Collection Time: 10/04/2016  4:42 PM  Result Value Ref Range   Specimen Description BLOOD BLOOD RIGHT HAND    Special Requests IN PEDIATRIC BOTTLE Blood Culture adequate volume    Culture NO GROWTH < 24 HOURS    Report Status PENDING   Lactic acid, plasma     Status: Abnormal   Collection Time: 10/15/2016  7:24 PM  Result Value Ref Range   Lactic Acid, Venous 2.7 (HH) 0.5 - 1.9 mmol/L    Comment: CRITICAL RESULT CALLED TO, READ BACK BY AND VERIFIED WITH: K DENNIS,RN 2015 10/09/2016 D BRADLEY   Cooxemetry Panel (carboxy, met, total hgb, O2 sat)     Status: Abnormal   Collection Time: 10/27/2016  7:44 PM  Result Value Ref Range   Total hemoglobin 9.2 (L) 12.0 - 16.0 g/dL   O2 Saturation 64.1 %   Carboxyhemoglobin 2.0 (H) 0.5 - 1.5 %   Methemoglobin 0.9 0.0 - 1.5 %  Glucose, capillary     Status: Abnormal   Collection Time: 10/04/2016  7:44  PM  Result Value Ref Range   Glucose-Capillary 132 (H) 65 - 99 mg/dL   Comment 1 Capillary Specimen    Comment 2 Notify RN    Comment 3 Document in Chart   Troponin I (q 6hr x 3)     Status: Abnormal   Collection Time: 10/13/2016  8:17 PM  Result Value Ref Range   Troponin I 0.46 (HH) <0.03 ng/mL    Comment: CRITICAL VALUE NOTED.  VALUE IS CONSISTENT WITH PREVIOUSLY REPORTED AND CALLED VALUE.  Glucose, capillary     Status: Abnormal   Collection Time: 10/31/2016 11:35 PM  Result Value Ref Range   Glucose-Capillary 179 (H) 65 - 99 mg/dL   Comment 1 Capillary Specimen    Comment 2 Notify RN    Comment 3 Document in Chart   Troponin I (q 6hr x 3)     Status: Abnormal   Collection Time: 10/12/16  3:04 AM  Result Value Ref Range   Troponin I 0.39 (HH) <0.03 ng/mL    Comment: CRITICAL VALUE NOTED.  VALUE IS CONSISTENT WITH PREVIOUSLY REPORTED AND CALLED VALUE.  Glucose, capillary     Status: Abnormal   Collection Time: 10/12/16  3:40 AM  Result Value Ref Range   Glucose-Capillary 179 (H) 65 - 99 mg/dL   Comment 1 Capillary Specimen    Comment 2 Notify RN    Comment 3 Document in Chart   Basic metabolic panel     Status: Abnormal   Collection Time: 10/12/16  4:54 AM  Result Value Ref Range   Sodium 126 (L)  135 - 145 mmol/L   Potassium 4.0 3.5 - 5.1 mmol/L   Chloride 93 (L) 101 - 111 mmol/L   CO2 22 22 - 32 mmol/L   Glucose, Bld 167 (H) 65 - 99 mg/dL   BUN 56 (H) 6 - 20 mg/dL   Creatinine, Ser 2.56 (H) 0.44 - 1.00 mg/dL   Calcium 7.2 (L) 8.9 - 10.3 mg/dL   GFR calc non Af Amer 18 (L) >60 mL/min   GFR calc Af Amer 21 (L) >60 mL/min    Comment: (NOTE) The eGFR has been calculated using the CKD EPI equation. This calculation has not been validated in all clinical situations. eGFR's persistently <60 mL/min signify possible Chronic Kidney Disease.    Anion gap 11 5 - 15  CBC     Status: Abnormal   Collection Time: 10/12/16  4:54 AM  Result Value Ref Range   WBC 2.9 (L) 4.0 - 10.5  K/uL   RBC 2.84 (L) 3.87 - 5.11 MIL/uL   Hemoglobin 7.6 (L) 12.0 - 15.0 g/dL    Comment: DELTA CHECK NOTED REPEATED TO VERIFY    HCT 22.8 (L) 36.0 - 46.0 %   MCV 80.3 78.0 - 100.0 fL   MCH 26.8 26.0 - 34.0 pg   MCHC 33.3 30.0 - 36.0 g/dL   RDW 17.2 (H) 11.5 - 15.5 %   Platelets 278 150 - 400 K/uL  Magnesium     Status: Abnormal   Collection Time: 10/12/16  4:54 AM  Result Value Ref Range   Magnesium 1.5 (L) 1.7 - 2.4 mg/dL  Phosphorus     Status: Abnormal   Collection Time: 10/12/16  4:54 AM  Result Value Ref Range   Phosphorus 4.8 (H) 2.5 - 4.6 mg/dL  Procalcitonin     Status: None   Collection Time: 10/12/16  4:54 AM  Result Value Ref Range   Procalcitonin 32.02 ng/mL    Comment:        Interpretation: PCT >= 10 ng/mL: Important systemic inflammatory response, almost exclusively due to severe bacterial sepsis or septic shock. (NOTE)         ICU PCT Algorithm               Non ICU PCT Algorithm    ----------------------------     ------------------------------         PCT < 0.25 ng/mL                 PCT < 0.1 ng/mL     Stopping of antibiotics            Stopping of antibiotics       strongly encouraged.               strongly encouraged.    ----------------------------     ------------------------------       PCT level decrease by               PCT < 0.25 ng/mL       >= 80% from peak PCT       OR PCT 0.25 - 0.5 ng/mL          Stopping of antibiotics                                             encouraged.     Stopping of antibiotics  encouraged.    ----------------------------     ------------------------------       PCT level decrease by              PCT >= 0.25 ng/mL       < 80% from peak PCT        AND PCT >= 0.5 ng/mL             Continuing antibiotics                                              encouraged.       Continuing antibiotics            encouraged.    ----------------------------     ------------------------------     PCT level increase  compared          PCT > 0.5 ng/mL         with peak PCT AND          PCT >= 0.5 ng/mL             Escalation of antibiotics                                          strongly encouraged.      Escalation of antibiotics        strongly encouraged.   Cooxemetry Panel (carboxy, met, total hgb, O2 sat)     Status: Abnormal   Collection Time: 10/12/16  5:22 AM  Result Value Ref Range   Total hemoglobin 7.7 (L) 12.0 - 16.0 g/dL   O2 Saturation 67.5 %   Carboxyhemoglobin 1.6 (H) 0.5 - 1.5 %   Methemoglobin 1.0 0.0 - 1.5 %  Glucose, capillary     Status: Abnormal   Collection Time: 10/12/16  7:50 AM  Result Value Ref Range   Glucose-Capillary 152 (H) 65 - 99 mg/dL   Comment 1 Capillary Specimen   Glucose, capillary     Status: None   Collection Time: 10/12/16 12:25 PM  Result Value Ref Range   Glucose-Capillary 95 65 - 99 mg/dL   Comment 1 Capillary Specimen   CBC     Status: Abnormal   Collection Time: 10/12/16 12:54 PM  Result Value Ref Range   WBC 3.0 (L) 4.0 - 10.5 K/uL   RBC 2.84 (L) 3.87 - 5.11 MIL/uL   Hemoglobin 7.7 (L) 12.0 - 15.0 g/dL   HCT 22.6 (L) 36.0 - 46.0 %   MCV 79.6 78.0 - 100.0 fL   MCH 27.1 26.0 - 34.0 pg   MCHC 34.1 30.0 - 36.0 g/dL   RDW 17.1 (H) 11.5 - 15.5 %   Platelets 238 150 - 400 K/uL  Glucose, capillary     Status: None   Collection Time: 10/12/16  4:08 PM  Result Value Ref Range   Glucose-Capillary 74 65 - 99 mg/dL   Comment 1 Capillary Specimen   Urinalysis, Routine w reflex microscopic     Status: Abnormal   Collection Time: 10/12/16  5:05 PM  Result Value Ref Range   Color, Urine YELLOW YELLOW   APPearance CLOUDY (A) CLEAR   Specific Gravity, Urine 1.011 1.005 - 1.030   pH 5.0 5.0 - 8.0   Glucose, UA NEGATIVE NEGATIVE  mg/dL   Hgb urine dipstick SMALL (A) NEGATIVE   Bilirubin Urine NEGATIVE NEGATIVE   Ketones, ur NEGATIVE NEGATIVE mg/dL   Protein, ur NEGATIVE NEGATIVE mg/dL   Nitrite NEGATIVE NEGATIVE   Leukocytes, UA NEGATIVE NEGATIVE    RBC / HPF 6-30 0 - 5 RBC/hpf   WBC, UA NONE SEEN 0 - 5 WBC/hpf   Bacteria, UA RARE (A) NONE SEEN   Squamous Epithelial / LPF 0-5 (A) NONE SEEN   Mucous PRESENT    Amorphous Crystal PRESENT   Basic metabolic panel     Status: Abnormal   Collection Time: 10/12/16  5:40 PM  Result Value Ref Range   Sodium 124 (L) 135 - 145 mmol/L   Potassium 3.9 3.5 - 5.1 mmol/L   Chloride 93 (L) 101 - 111 mmol/L   CO2 19 (L) 22 - 32 mmol/L   Glucose, Bld 92 65 - 99 mg/dL   BUN 58 (H) 6 - 20 mg/dL   Creatinine, Ser 2.31 (H) 0.44 - 1.00 mg/dL   Calcium 7.2 (L) 8.9 - 10.3 mg/dL   GFR calc non Af Amer 21 (L) >60 mL/min   GFR calc Af Amer 24 (L) >60 mL/min    Comment: (NOTE) The eGFR has been calculated using the CKD EPI equation. This calculation has not been validated in all clinical situations. eGFR's persistently <60 mL/min signify possible Chronic Kidney Disease.    Anion gap 12 5 - 15  Glucose, capillary     Status: None   Collection Time: 10/12/16  8:21 PM  Result Value Ref Range   Glucose-Capillary 90 65 - 99 mg/dL  Glucose, capillary     Status: None   Collection Time: 10/12/16 11:14 PM  Result Value Ref Range   Glucose-Capillary 90 65 - 99 mg/dL   Comment 1 Capillary Specimen   Procalcitonin     Status: None   Collection Time: 10/13/16  1:30 AM  Result Value Ref Range   Procalcitonin 27.38 ng/mL    Comment:        Interpretation: PCT >= 10 ng/mL: Important systemic inflammatory response, almost exclusively due to severe bacterial sepsis or septic shock. (NOTE)         ICU PCT Algorithm               Non ICU PCT Algorithm    ----------------------------     ------------------------------         PCT < 0.25 ng/mL                 PCT < 0.1 ng/mL     Stopping of antibiotics            Stopping of antibiotics       strongly encouraged.               strongly encouraged.    ----------------------------     ------------------------------       PCT level decrease by               PCT <  0.25 ng/mL       >= 80% from peak PCT       OR PCT 0.25 - 0.5 ng/mL          Stopping of antibiotics  encouraged.     Stopping of antibiotics           encouraged.    ----------------------------     ------------------------------       PCT level decrease by              PCT >= 0.25 ng/mL       < 80% from peak PCT        AND PCT >= 0.5 ng/mL             Continuing antibiotics                                              encouraged.       Continuing antibiotics            encouraged.    ----------------------------     ------------------------------     PCT level increase compared          PCT > 0.5 ng/mL         with peak PCT AND          PCT >= 0.5 ng/mL             Escalation of antibiotics                                          strongly encouraged.      Escalation of antibiotics        strongly encouraged.   CBC     Status: Abnormal   Collection Time: 10/13/16  1:30 AM  Result Value Ref Range   WBC 8.0 4.0 - 10.5 K/uL   RBC 2.73 (L) 3.87 - 5.11 MIL/uL   Hemoglobin 7.2 (L) 12.0 - 15.0 g/dL   HCT 21.5 (L) 36.0 - 46.0 %   MCV 78.8 78.0 - 100.0 fL   MCH 26.4 26.0 - 34.0 pg   MCHC 33.5 30.0 - 36.0 g/dL   RDW 17.0 (H) 11.5 - 15.5 %   Platelets 222 150 - 400 K/uL  Basic metabolic panel     Status: Abnormal   Collection Time: 10/13/16  1:30 AM  Result Value Ref Range   Sodium 125 (L) 135 - 145 mmol/L   Potassium 3.5 3.5 - 5.1 mmol/L   Chloride 90 (L) 101 - 111 mmol/L   CO2 21 (L) 22 - 32 mmol/L   Glucose, Bld 89 65 - 99 mg/dL   BUN 57 (H) 6 - 20 mg/dL   Creatinine, Ser 2.17 (H) 0.44 - 1.00 mg/dL   Calcium 7.4 (L) 8.9 - 10.3 mg/dL   GFR calc non Af Amer 22 (L) >60 mL/min   GFR calc Af Amer 26 (L) >60 mL/min    Comment: (NOTE) The eGFR has been calculated using the CKD EPI equation. This calculation has not been validated in all clinical situations. eGFR's persistently <60 mL/min signify possible Chronic Kidney Disease.     Anion gap 14 5 - 15  Magnesium     Status: None   Collection Time: 10/13/16  1:30 AM  Result Value Ref Range   Magnesium 1.9 1.7 - 2.4 mg/dL  Phosphorus     Status: Abnormal   Collection Time: 10/13/16  1:30 AM  Result Value Ref Range   Phosphorus 5.3 (  H) 2.5 - 4.6 mg/dL  Hepatic function panel     Status: Abnormal   Collection Time: 10/13/16  1:30 AM  Result Value Ref Range   Total Protein 5.5 (L) 6.5 - 8.1 g/dL   Albumin 2.2 (L) 3.5 - 5.0 g/dL   AST 25 15 - 41 U/L   ALT 14 14 - 54 U/L   Alkaline Phosphatase 52 38 - 126 U/L   Total Bilirubin 2.2 (H) 0.3 - 1.2 mg/dL   Bilirubin, Direct 1.3 (H) 0.1 - 0.5 mg/dL   Indirect Bilirubin 0.9 0.3 - 0.9 mg/dL  Glucose, capillary     Status: None   Collection Time: 10/13/16  3:31 AM  Result Value Ref Range   Glucose-Capillary 94 65 - 99 mg/dL  Troponin I     Status: Abnormal   Collection Time: 10/13/16  4:42 AM  Result Value Ref Range   Troponin I 0.27 (HH) <0.03 ng/mL    Comment: CRITICAL VALUE NOTED.  VALUE IS CONSISTENT WITH PREVIOUSLY REPORTED AND CALLED VALUE.  Cooxemetry Panel (carboxy, met, total hgb, O2 sat)     Status: Abnormal   Collection Time: 10/13/16  5:50 AM  Result Value Ref Range   Total hemoglobin 7.3 (L) 12.0 - 16.0 g/dL   O2 Saturation 78.1 %   Carboxyhemoglobin 1.4 0.5 - 1.5 %   Methemoglobin 0.8 0.0 - 1.5 %    Imaging / Studies: Ct Abdomen Pelvis Wo Contrast  Result Date: 10/17/2016 CLINICAL DATA:  Diffuse abdominal pain with distention. Status post laparoscopic bilateral inguinal, bilateral obturator, and umbilical hernia repair with mesh on 09/28/2016. EXAM: CT ABDOMEN AND PELVIS WITHOUT CONTRAST TECHNIQUE: Multidetector CT imaging of the abdomen and pelvis was performed following the standard protocol without IV contrast. COMPARISON:  08/27/2015 FINDINGS: Lower chest: Heart is markedly enlarged. There is compressive atelectasis in the lower lungs. Probable small volume fluid in the right major fissure.  Hepatobiliary: Insert normal noncontrast liver gallbladder not well seen. No intrahepatic or extrahepatic biliary dilation. Pancreas: No focal mass lesion. No dilatation of the main duct. No intraparenchymal cyst. No peripancreatic edema. Spleen: No splenomegaly. No focal mass lesion. Adrenals/Urinary Tract: No adrenal nodule or mass. No stones seen in either kidney. No hydronephrosis. The urinary bladder appears normal for the degree of distention. Stomach/Bowel: Stomach not well seen. No small bowel dilatation. No colonic dilatation. Vascular/Lymphatic: There is abdominal aortic atherosclerosis without aneurysm. Limited assessment for intraperitoneal lymphadenopathy given congestion, edema, fluid, and lack of intravenous contrast material. Reproductive: Fibroid change noted in the uterus. No definite adnexal mass. Other: Moderate volume ascites. Musculoskeletal: Fluid collection is identified deep to the rectus sheath, starting just above the umbilicus and tracking caudally down towards the symphysis pubis. This is extraperitoneal. There is another fluid collection on the antro lateral right lower abdominal wall the contains debris and scattered gas bubbles (see image 34 series 6 and image 51 series 6). This does not appear to represent gas within bowel and could be gas with an and hematoma raising the question of superinfection. 6 x 9 cm fluid collection identified in the left groin. 9 x 10 cm heterogeneous fluid collection identified in the right groin.Bone windows reveal no worrisome lytic or sclerotic osseous lesions. IMPRESSION: 1. Moderate volume ascites. 2. Interval development of an apparent extraperitoneal fluid collection tracking deep to the rectus sheath beginning just cranial to the umbilicus and tracking down towards the symphysis pubis. Given the history of surgery in this region, postoperative seroma/hematoma would be a consideration.  Infection not excluded. 3. Fluid collection tracking along the  anterolateral right lower abdominal wall appears to be extraperitoneal although this is difficult to evaluate given the lack of intravenous and oral contrast. Gas contained within this collection does not appear consistent with an intraluminal location raising the question of gas within a hematoma or seroma. As such, superinfection of this collection is a concern. 4. Bilateral fluid collections identified in the groin regions. Patient is status post recent bilateral groin hernia repair and imaging features are compatible with recurrence. The collection in the right groin areas heterogeneous and may represent clot. No definite bowel within the groin collections. 5. Moderate ascites. 6. Marked cardiomegaly Electronically Signed   By: Misty Stanley M.D.   On: 10/09/2016 13:27   Dg Chest 1 View  Result Date: 10/12/2016 CLINICAL DATA:  Dyspnea today, history hypertension, coronary artery disease, CHF, paroxysmal atrial flutter, prior NSTEMI, nonalcoholic cirrhosis EXAM: CHEST 1 VIEW COMPARISON:  Portable exam 1710 hours compared to 10/22/2016 FINDINGS: RIGHT jugular central venous catheter with tip projecting over upper RIGHT atrium or cavoatrial junction. LEFT subclavian transvenous AICD lead projects over RIGHT ventricle unchanged. Enlargement of cardiac silhouette with pulmonary vascular congestion. Atherosclerotic calcification aorta. Mild persistent atelectasis at RIGHT base. Persistent atelectasis versus consolidation in LEFT lower lobe, cannot exclude component of LEFT pleural effusion. Upper lungs clear. No pneumothorax. IMPRESSION: Enlargement of cardiac silhouette with pulmonary vascular congestion. Persistent atelectasis versus consolidation LEFT lower lobe with suspect small LEFT pleural effusion. Persistent mild RIGHT basilar atelectasis. Electronically Signed   By: Lavonia Dana M.D.   On: 10/12/2016 17:23   Dg Chest Port 1 View  Result Date: 10/25/2016 CLINICAL DATA:  Central line placement. EXAM:  PORTABLE CHEST 1 VIEW COMPARISON:  10/10/2016. FINDINGS: Right IJ central line tip projects at the SVC RA junction. Single lead ICD device projects over the right ventricle. Heart is moderately enlarged. Lungs are somewhat low in volume with bibasilar atelectasis. No definite pleural fluid. No pneumothorax. IMPRESSION: 1. Right IJ central line tip projects at the SVC RA junction. No pneumothorax. 2. Bibasilar atelectasis. Electronically Signed   By: Lorin Picket M.D.   On: 10/06/2016 16:41   Dg Chest Portable 1 View  Result Date: 10/07/2016 CLINICAL DATA:  Hypotension EXAM: PORTABLE CHEST 1 VIEW COMPARISON:  09/30/2016 FINDINGS: Chronic marked cardiopericardial enlargement. Poorly visualized bases: There is atelectasis on contemporaneous abdominal CT. No evidence of edema, effusion, or pneumothorax. Single chamber ICD/ pacer into the right ventricle. IMPRESSION: 1. Bibasilar atelectasis. 2. Chronic marked cardiomegaly. Electronically Signed   By: Monte Fantasia M.D.   On: 10/08/2016 13:51    Medications / Allergies: per chart  Antibiotics: Anti-infectives    Start     Dose/Rate Route Frequency Ordered Stop   10/13/16 0000  vancomycin (VANCOCIN) IVPB 750 mg/150 ml premix  Status:  Discontinued     750 mg 150 mL/hr over 60 Minutes Intravenous Every 48 hours 10/09/2016 1512 10/12/16 1229   10/12/16 1300  cefTRIAXone (ROCEPHIN) 2 g in dextrose 5 % 50 mL IVPB     2 g 100 mL/hr over 30 Minutes Intravenous Every 24 hours 10/12/16 1245     10/29/2016 1515  clindamycin (CLEOCIN) IVPB 600 mg  Status:  Discontinued     600 mg 100 mL/hr over 30 Minutes Intravenous Every 8 hours 10/06/2016 1504 10/12/16 1229   10/17/2016 1515  piperacillin-tazobactam (ZOSYN) IVPB 2.25 g  Status:  Discontinued     2.25 g 100 mL/hr over 30 Minutes Intravenous  Every 8 hours 10/27/2016 1512 10/12/16 1229   10/05/2016 1515  vancomycin (VANCOCIN) 1,250 mg in sodium chloride 0.9 % 250 mL IVPB     1,250 mg 166.7 mL/hr over 90 Minutes  Intravenous  Once 10/27/2016 1512 10/08/2016 1836        Note: Portions of this report may have been transcribed using voice recognition software. Every effort was made to ensure accuracy; however, inadvertent computerized transcription errors may be present.   Any transcriptional errors that result from this process are unintentional.     Adin Hector, M.D., F.A.C.S. Gastrointestinal and Minimally Invasive Surgery [facility] [address], [place], [place] [address] [phone] Main / Paging   10/13/2016

## 2016-10-13 NOTE — Progress Notes (Addendum)
notified e-link of hr of 120> sustaining at 2000 and 0000. Metoprolol ordered and admin with no effect on rate control. Continue POC.  5/11 0420- called e-link with new ECG results of acute MI findings, upon review from provider ecg is not a true MI, but will check enzymes. This RN to collect labs.cards ( fudim) made aware of event, no new orders from them.  [id number]- [facility] after failed bowl moment, upon inspection of abdomin the established fluid filled masses located on lower abdomin are double in size, note made in chart under GI 0540   *change from golf ball size to softball size.*  Soft to palpation, and no painful as before. E-link made aware provider to review labs, no new orders at this time.
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
## 2016-10-13 NOTE — Progress Notes (Signed)
After pt received 1200 lasix dose, output did not increase but decreased.  Waited on BMP results to look at creatinine before adm. next dose of lasix.  Creatinine did improve to 1.9.  Notified Dr. Vassie LollAlva of the decreased u.o. since last dose and newest lab results.  Clarified to go ahead and give 1800 dose.

## 2016-10-13 NOTE — Progress Notes (Signed)
Patient is resting comfortably at this time on room air. No respiratory distress noted. BIPAP is not needed at this time. RT will monitor as needed.

## 2016-10-13 NOTE — Progress Notes (Signed)
DAILY PROGRESS NOTE   Patient Name: Sheri Dunn Date of Encounter: 10/13/2016  Hospital Problem List   Principal Problem:   Cardiogenic shock [facility]) Active Problems:   Elevated troponin   CAD S/P PCI April 2016   Aortic insufficiency   Ischemic cardiomyopathy   Chronic combined systolic and diastolic CHF [name]   ICD (implantable cardioverter-defibrillator) in place   Hypertensive heart disease   Bilateral inguinal hernias s/p lap repair with mesh 09/28/2016   Obturator hernias, bilateral, s/p lap repair with mesh 3/66/4403   Umbilical hernia s/p lap repair with mesh 09/28/2016   Constipation, chronic   Cirrhosis, nonalcoholic   Poorly controlled ascites   Acute ST elevation myocardial infarction (STEMI) of inferior wall (Sheri Dunn)   BLQ abdominal wall old hernia sac seromas - LARGE   Septic shock (HCC)   Pressure injury of skin   Abdominal pain   Acute pulmonary edema (Sheri Dunn)   AKI [name]   ARF (acute renal failure) (Sheri Dunn)    Chief Complaint   Feels better today.   Subjective   Rhythm changed noted around 15:38 yesterday based on my personal review of telemetry - she was in a-fib with a narrow complex QRS. Now in a wide complex, RBBB morphology rhythm - ?atrial flutter with aberrancy - 2:1 conduction. She is on milrinone and levophed gtts. No medications for rate control d/t hypotension. Co-ox 78 today - excellent. Remains 4L positive.  Troponin elevated overnight at 0.27, however, this has been flatly elevated and c/w prior values.   Objective   Vitals:   10/13/16 0700 10/13/16 0800 10/13/16 0840 10/13/16 0900  BP: 137/73 110/61  104/63  Pulse: (!) 121 (!) 121  (!) 122  Resp: (!) 24 (!) 24  (!) 32  Temp:  98 F (36.7 C)    TempSrc:  Oral    SpO2: 100% 98% 97% 94%  Weight:      Height:        Intake/Output Summary (Last 24 hours) at 10/13/16 0910 Last data filed at 10/13/16 0900  Gross per 24 hour  Intake           2022.47 ml  Output             1650 ml  Net           372.47 ml   Filed Weights   10/20/2016 1600 10/12/16 0500 10/13/16 0600  Weight: 124 lb 9 oz (56.5 kg) 127 lb 10.3 oz (57.9 kg) 130 lb (59 kg)    Physical Exam   General appearance: alert, no distress and sitting up in the chair and eating Neck: JVD - 3 cm above sternal notch and no carotid bruit Lungs: diminished breath sounds bilaterally Heart: regular tachycardia Abdomen: soft, non-tender; bowel sounds normal; no masses,  no organomegaly Extremities: edema trace edema Pulses: 2+ and symmetric Skin: Skin color, texture, turgor normal. No rashes or lesions Neurologic: Grossly normal Psych: Flat affect  Inpatient Medications    Scheduled Meds: . enoxaparin (LOVENOX) injection  30 mg Subcutaneous Q24H  . hydrocortisone sod succinate (SOLU-CORTEF) inj  100 mg Intravenous Q8H  . polyethylene glycol  17 g Oral BID    Continuous Infusions: . sodium chloride    . cefTRIAXone (ROCEPHIN)  IV Stopped (10/12/16 1401)  . milrinone 0.25 mcg/kg/min (10/13/16 0900)  . norepinephrine (LEVOPHED) Adult infusion 8 mcg/min (10/13/16 0900)  . vasopressin (PITRESSIN) infusion - *FOR SHOCK* 0.03 Units/min (10/13/16 0900)  PRN Meds: sodium chloride, acetaminophen, acetaminophen, alum & mag hydroxide-simeth, bisacodyl, lip balm, magic mouthwash, menthol-cetylpyridinium, metoprolol, oxyCODONE, phenol, polyethylene glycol   Labs   Results for orders placed or performed during the hospital encounter of 10/17/2016 (from the past 48 hour(s))  I-stat troponin, ED     Status: Abnormal   Collection Time: 10/07/2016 12:04 PM  Result Value Ref Range   Troponin i, poc 0.88 (HH) 0.00 - 0.08 ng/mL   Comment NOTIFIED PHYSICIAN    Comment 3            Comment: Due to the release kinetics of cTnI, a negative result within the first hours of the onset of symptoms does not rule out myocardial infarction with certainty. If myocardial infarction is still  suspected, repeat the test at appropriate intervals.   CBC with Differential/Platelet     Status: Abnormal   Collection Time: 10/14/2016 12:08 PM  Result Value Ref Range   WBC 3.9 (L) 4.0 - 10.5 K/uL    Comment: REPEATED TO VERIFY   RBC 3.55 (L) 3.87 - 5.11 MIL/uL   Hemoglobin 9.8 (L) 12.0 - 15.0 g/dL    Comment: REPEATED TO VERIFY   HCT 29.0 (L) 36.0 - 46.0 %   MCV 81.7 78.0 - 100.0 fL   MCH 27.6 26.0 - 34.0 pg   MCHC 33.8 30.0 - 36.0 g/dL   RDW 17.3 (H) 11.5 - 15.5 %   Platelets 311 150 - 400 K/uL   Neutrophils Relative % 23 %   Lymphocytes Relative 21 %   Monocytes Relative 9 %   Eosinophils Relative 0 %   Basophils Relative 0 %   Band Neutrophils 40 %   Metamyelocytes Relative 5 %   Myelocytes 0 %   Promyelocytes Absolute 0 %   Blasts 0 %   nRBC 1 (H) 0 /100 WBC   Other 2 %   Neutro Abs 2.7 1.7 - 7.7 K/uL   Lymphs Abs 0.8 0.7 - 4.0 K/uL   Monocytes Absolute 0.4 0.1 - 1.0 K/uL   Eosinophils Absolute 0.0 0.0 - 0.7 K/uL   Basophils Absolute 0.0 0.0 - 0.1 K/uL   RBC Morphology BURR CELLS     Comment: TARGET CELLS   WBC Morphology INCREASED BANDS (>20% BANDS)     Comment: ATYPICAL LYMPHOCYTES  Protime-INR     Status: Abnormal   Collection Time: 10/17/2016 12:37 PM  Result Value Ref Range   Prothrombin Time 21.2 (H) 11.4 - 15.2 seconds   INR 1.80   APTT     Status: Abnormal   Collection Time: 10/22/2016 12:37 PM  Result Value Ref Range   aPTT 91 (H) 24 - 36 seconds    Comment:        IF BASELINE aPTT IS ELEVATED, SUGGEST PATIENT RISK ASSESSMENT BE USED TO DETERMINE APPROPRIATE ANTICOAGULANT THERAPY.   Comprehensive metabolic panel     Status: Abnormal   Collection Time: 10/30/2016 12:37 PM  Result Value Ref Range   Sodium 132 (L) 135 - 145 mmol/L   Potassium 4.6 3.5 - 5.1 mmol/L   Chloride 98 (L) 101 - 111 mmol/L   CO2 17 (L) 22 - 32 mmol/L   Glucose, Bld 43 (LL) 65 - 99 mg/dL    Comment: CRITICAL RESULT CALLED TO, READ BACK BY AND VERIFIED WITH: G.NIKOLICH,RN [id number]  07/14/77 CLARK,S    BUN 53 (H) 6 - 20 mg/dL   Creatinine, Ser 2.63 (H) 0.44 - 1.00 mg/dL   Calcium 8.0 (L)  8.9 - 10.3 mg/dL   Total Protein 5.8 (L) 6.5 - 8.1 g/dL   Albumin 2.5 (L) 3.5 - 5.0 g/dL   AST 31 15 - 41 U/L   ALT 14 14 - 54 U/L   Alkaline Phosphatase 62 38 - 126 U/L   Total Bilirubin 2.4 (H) 0.3 - 1.2 mg/dL   GFR calc non Af Amer 18 (L) >60 mL/min   GFR calc Af Amer 21 (L) >60 mL/min    Comment: (NOTE) The eGFR has been calculated using the CKD EPI equation. This calculation has not been validated in all clinical situations. eGFR's persistently <60 mL/min signify possible Chronic Kidney Disease.    Anion gap 17 (H) 5 - 15  Lipid panel     Status: Abnormal   Collection Time: 10/31/2016 12:37 PM  Result Value Ref Range   Cholesterol 41 0 - 200 mg/dL   Triglycerides 83 <150 mg/dL   HDL 11 (L) >40 mg/dL   Total CHOL/HDL Ratio 3.7 RATIO   VLDL 17 0 - 40 mg/dL   LDL Cholesterol 13 0 - 99 mg/dL    Comment:        Total Cholesterol/HDL:CHD Risk Coronary Heart Disease Risk Table                     Men   Women  1/2 Average Risk   3.4   3.3  Average Risk       5.0   4.4  2 X Average Risk   9.6   7.1  3 X Average Risk  23.4   11.0        Use the calculated Patient Ratio above and the CHD Risk Table to determine the patient's CHD Risk.        ATP III CLASSIFICATION (LDL):  <100     mg/dL   Optimal  100-129  mg/dL   Near or Above                    Optimal  130-159  mg/dL   Borderline  160-189  mg/dL   High  >190     mg/dL   Very High   I-Stat CG4 Lactic Acid, ED     Status: Abnormal   Collection Time: 10/24/2016  1:25 PM  Result Value Ref Range   Lactic Acid, Venous 5.27 (HH) 0.5 - 1.9 mmol/L   Comment NOTIFIED PHYSICIAN   Troponin I (q 6hr x 3)     Status: Abnormal   Collection Time: 10/17/2016  2:17 PM  Result Value Ref Range   Troponin I 0.43 (HH) <0.03 ng/mL    Comment: CRITICAL RESULT CALLED TO, READ BACK BY AND VERIFIED WITH: Kathlen Mody 1800 10/25/2016 WBOND    Procalcitonin - Baseline     Status: None   Collection Time: 10/19/2016  2:17 PM  Result Value Ref Range   Procalcitonin 37.31 ng/mL    Comment:        Interpretation: PCT >= 10 ng/mL: Important systemic inflammatory response, almost exclusively due to severe bacterial sepsis or septic shock. (NOTE)         ICU PCT Algorithm               Non ICU PCT Algorithm    ----------------------------     ------------------------------         PCT < 0.25 ng/mL                 PCT <  0.1 ng/mL     Stopping of antibiotics            Stopping of antibiotics       strongly encouraged.               strongly encouraged.    ----------------------------     ------------------------------       PCT level decrease by               PCT < 0.25 ng/mL       >= 80% from peak PCT       OR PCT 0.25 - 0.5 ng/mL          Stopping of antibiotics                                             encouraged.     Stopping of antibiotics           encouraged.    ----------------------------     ------------------------------       PCT level decrease by              PCT >= 0.25 ng/mL       < 80% from peak PCT        AND PCT >= 0.5 ng/mL             Continuing antibiotics                                              encouraged.       Continuing antibiotics            encouraged.    ----------------------------     ------------------------------     PCT level increase compared          PCT > 0.5 ng/mL         with peak PCT AND          PCT >= 0.5 ng/mL             Escalation of antibiotics                                          strongly encouraged.      Escalation of antibiotics        strongly encouraged.   Ammonia     Status: None   Collection Time: 10/31/2016  2:20 PM  Result Value Ref Range   Ammonia 34 9 - 35 umol/L  CBG monitoring, ED     Status: Abnormal   Collection Time: 10/16/2016  2:37 PM  Result Value Ref Range   Glucose-Capillary 119 (H) 65 - 99 mg/dL  Culture, blood (routine x 2)     Status: Abnormal  (Preliminary result)   Collection Time: 10/06/2016  3:06 PM  Result Value Ref Range   Specimen Description BLOOD BLOOD RIGHT HAND    Special Requests IN PEDIATRIC BOTTLE Blood Culture adequate volume    Culture  Setup Time      GRAM NEGATIVE RODS IN PEDIATRIC BOTTLE CRITICAL RESULT CALLED TO, READ BACK BY AND VERIFIED WITH: C. BALL PHARMD, AT 7989 10/12/16 BY D. Victoriano Lain  Culture ESCHERICHIA COLI SUSCEPTIBILITIES TO FOLLOW  (A)    Report Status PENDING   Blood Culture ID Panel (Reflexed)     Status: Abnormal   Collection Time: 11/02/2016  3:06 PM  Result Value Ref Range   Enterococcus species NOT DETECTED NOT DETECTED   Listeria monocytogenes NOT DETECTED NOT DETECTED   Staphylococcus species NOT DETECTED NOT DETECTED   Staphylococcus aureus NOT DETECTED NOT DETECTED   Streptococcus species NOT DETECTED NOT DETECTED   Streptococcus agalactiae NOT DETECTED NOT DETECTED   Streptococcus pneumoniae NOT DETECTED NOT DETECTED   Streptococcus pyogenes NOT DETECTED NOT DETECTED   Acinetobacter baumannii NOT DETECTED NOT DETECTED   Enterobacteriaceae species DETECTED (A) NOT DETECTED    Comment: Enterobacteriaceae represent a large family of gram-negative bacteria, not a single organism. CRITICAL RESULT CALLED TO, READ BACK BY AND VERIFIED WITH: C. BALL PHARMD, AT 0707 10/12/16 BY D. VANHOOK    Enterobacter cloacae complex NOT DETECTED NOT DETECTED   Escherichia coli DETECTED (A) NOT DETECTED    Comment: CRITICAL RESULT CALLED TO, READ BACK BY AND VERIFIED WITH: C. BALL PHARMD, AT 0707 10/12/16 BY D. VANHOOK    Klebsiella oxytoca NOT DETECTED NOT DETECTED   Klebsiella pneumoniae NOT DETECTED NOT DETECTED   Proteus species NOT DETECTED NOT DETECTED   Serratia marcescens NOT DETECTED NOT DETECTED   Carbapenem resistance NOT DETECTED NOT DETECTED   Haemophilus influenzae NOT DETECTED NOT DETECTED   Neisseria meningitidis NOT DETECTED NOT DETECTED   Pseudomonas aeruginosa NOT DETECTED NOT  DETECTED   Candida albicans NOT DETECTED NOT DETECTED   Candida glabrata NOT DETECTED NOT DETECTED   Candida krusei NOT DETECTED NOT DETECTED   Candida parapsilosis NOT DETECTED NOT DETECTED   Candida tropicalis NOT DETECTED NOT DETECTED  Lactic acid, plasma     Status: Abnormal   Collection Time: 10/26/2016  4:00 PM  Result Value Ref Range   Lactic Acid, Venous 3.0 (HH) 0.5 - 1.9 mmol/L    Comment: CRITICAL RESULT CALLED TO, READ BACK BY AND VERIFIED WITH: Kathlen Mody 1750 10/10/2016 WBOND   Glucose, capillary     Status: Abnormal   Collection Time: 10/28/2016  4:33 PM  Result Value Ref Range   Glucose-Capillary 121 (H) 65 - 99 mg/dL   Comment 1 Venous Specimen   Culture, blood (routine x 2)     Status: None (Preliminary result)   Collection Time: 10/14/2016  4:42 PM  Result Value Ref Range   Specimen Description BLOOD BLOOD RIGHT HAND    Special Requests IN PEDIATRIC BOTTLE Blood Culture adequate volume    Culture NO GROWTH < 24 HOURS    Report Status PENDING   Lactic acid, plasma     Status: Abnormal   Collection Time: 11/02/2016  7:24 PM  Result Value Ref Range   Lactic Acid, Venous 2.7 (HH) 0.5 - 1.9 mmol/L    Comment: CRITICAL RESULT CALLED TO, READ BACK BY AND VERIFIED WITH: K DENNIS,RN 2015 10/21/2016 D BRADLEY   Cooxemetry Panel (carboxy, met, total hgb, O2 sat)     Status: Abnormal   Collection Time: 11/02/2016  7:44 PM  Result Value Ref Range   Total hemoglobin 9.2 (L) 12.0 - 16.0 g/dL   O2 Saturation 64.1 %   Carboxyhemoglobin 2.0 (H) 0.5 - 1.5 %   Methemoglobin 0.9 0.0 - 1.5 %  Glucose, capillary     Status: Abnormal   Collection Time: 10/07/2016  7:44 PM  Result Value Ref Range   Glucose-Capillary 132 (H)  65 - 99 mg/dL   Comment 1 Capillary Specimen    Comment 2 Notify RN    Comment 3 Document in Chart   Troponin I (q 6hr x 3)     Status: Abnormal   Collection Time: 11/02/2016  8:17 PM  Result Value Ref Range   Troponin I 0.46 (HH) <0.03 ng/mL    Comment: CRITICAL VALUE  NOTED.  VALUE IS CONSISTENT WITH PREVIOUSLY REPORTED AND CALLED VALUE.  Glucose, capillary     Status: Abnormal   Collection Time: 10/26/2016 11:35 PM  Result Value Ref Range   Glucose-Capillary 179 (H) 65 - 99 mg/dL   Comment 1 Capillary Specimen    Comment 2 Notify RN    Comment 3 Document in Chart   Troponin I (q 6hr x 3)     Status: Abnormal   Collection Time: 10/12/16  3:04 AM  Result Value Ref Range   Troponin I 0.39 (HH) <0.03 ng/mL    Comment: CRITICAL VALUE NOTED.  VALUE IS CONSISTENT WITH PREVIOUSLY REPORTED AND CALLED VALUE.  Glucose, capillary     Status: Abnormal   Collection Time: 10/12/16  3:40 AM  Result Value Ref Range   Glucose-Capillary 179 (H) 65 - 99 mg/dL   Comment 1 Capillary Specimen    Comment 2 Notify RN    Comment 3 Document in Chart   Basic metabolic panel     Status: Abnormal   Collection Time: 10/12/16  4:54 AM  Result Value Ref Range   Sodium 126 (L) 135 - 145 mmol/L   Potassium 4.0 3.5 - 5.1 mmol/L   Chloride 93 (L) 101 - 111 mmol/L   CO2 22 22 - 32 mmol/L   Glucose, Bld 167 (H) 65 - 99 mg/dL   BUN 56 (H) 6 - 20 mg/dL   Creatinine, Ser 2.56 (H) 0.44 - 1.00 mg/dL   Calcium 7.2 (L) 8.9 - 10.3 mg/dL   GFR calc non Af Amer 18 (L) >60 mL/min   GFR calc Af Amer 21 (L) >60 mL/min    Comment: (NOTE) The eGFR has been calculated using the CKD EPI equation. This calculation has not been validated in all clinical situations. eGFR's persistently <60 mL/min signify possible Chronic Kidney Disease.    Anion gap 11 5 - 15  CBC     Status: Abnormal   Collection Time: 10/12/16  4:54 AM  Result Value Ref Range   WBC 2.9 (L) 4.0 - 10.5 K/uL   RBC 2.84 (L) 3.87 - 5.11 MIL/uL   Hemoglobin 7.6 (L) 12.0 - 15.0 g/dL    Comment: DELTA CHECK NOTED REPEATED TO VERIFY    HCT 22.8 (L) 36.0 - 46.0 %   MCV 80.3 78.0 - 100.0 fL   MCH 26.8 26.0 - 34.0 pg   MCHC 33.3 30.0 - 36.0 g/dL   RDW 17.2 (H) 11.5 - 15.5 %   Platelets 278 150 - 400 K/uL  Magnesium     Status:  Abnormal   Collection Time: 10/12/16  4:54 AM  Result Value Ref Range   Magnesium 1.5 (L) 1.7 - 2.4 mg/dL  Phosphorus     Status: Abnormal   Collection Time: 10/12/16  4:54 AM  Result Value Ref Range   Phosphorus 4.8 (H) 2.5 - 4.6 mg/dL  Procalcitonin     Status: None   Collection Time: 10/12/16  4:54 AM  Result Value Ref Range   Procalcitonin 32.02 ng/mL    Comment:  Interpretation: PCT >= 10 ng/mL: Important systemic inflammatory response, almost exclusively due to severe bacterial sepsis or septic shock. (NOTE)         ICU PCT Algorithm               Non ICU PCT Algorithm    ----------------------------     ------------------------------         PCT < 0.25 ng/mL                 PCT < 0.1 ng/mL     Stopping of antibiotics            Stopping of antibiotics       strongly encouraged.               strongly encouraged.    ----------------------------     ------------------------------       PCT level decrease by               PCT < 0.25 ng/mL       >= 80% from peak PCT       OR PCT 0.25 - 0.5 ng/mL          Stopping of antibiotics                                             encouraged.     Stopping of antibiotics           encouraged.    ----------------------------     ------------------------------       PCT level decrease by              PCT >= 0.25 ng/mL       < 80% from peak PCT        AND PCT >= 0.5 ng/mL             Continuing antibiotics                                              encouraged.       Continuing antibiotics            encouraged.    ----------------------------     ------------------------------     PCT level increase compared          PCT > 0.5 ng/mL         with peak PCT AND          PCT >= 0.5 ng/mL             Escalation of antibiotics                                          strongly encouraged.      Escalation of antibiotics        strongly encouraged.   Cooxemetry Panel (carboxy, met, total hgb, O2 sat)     Status: Abnormal    Collection Time: 10/12/16  5:22 AM  Result Value Ref Range   Total hemoglobin 7.7 (L) 12.0 - 16.0 g/dL   O2 Saturation 67.5 %   Carboxyhemoglobin 1.6 (H) 0.5 - 1.5 %   Methemoglobin 1.0 0.0 -  1.5 %  Glucose, capillary     Status: Abnormal   Collection Time: 10/12/16  7:50 AM  Result Value Ref Range   Glucose-Capillary 152 (H) 65 - 99 mg/dL   Comment 1 Capillary Specimen   Glucose, capillary     Status: None   Collection Time: 10/12/16 12:25 PM  Result Value Ref Range   Glucose-Capillary 95 65 - 99 mg/dL   Comment 1 Capillary Specimen   CBC     Status: Abnormal   Collection Time: 10/12/16 12:54 PM  Result Value Ref Range   WBC 3.0 (L) 4.0 - 10.5 K/uL   RBC 2.84 (L) 3.87 - 5.11 MIL/uL   Hemoglobin 7.7 (L) 12.0 - 15.0 g/dL   HCT 22.6 (L) 36.0 - 46.0 %   MCV 79.6 78.0 - 100.0 fL   MCH 27.1 26.0 - 34.0 pg   MCHC 34.1 30.0 - 36.0 g/dL   RDW 17.1 (H) 11.5 - 15.5 %   Platelets 238 150 - 400 K/uL  Glucose, capillary     Status: None   Collection Time: 10/12/16  4:08 PM  Result Value Ref Range   Glucose-Capillary 74 65 - 99 mg/dL   Comment 1 Capillary Specimen   Urinalysis, Routine w reflex microscopic     Status: Abnormal   Collection Time: 10/12/16  5:05 PM  Result Value Ref Range   Color, Urine YELLOW YELLOW   APPearance CLOUDY (A) CLEAR   Specific Gravity, Urine 1.011 1.005 - 1.030   pH 5.0 5.0 - 8.0   Glucose, UA NEGATIVE NEGATIVE mg/dL   Hgb urine dipstick SMALL (A) NEGATIVE   Bilirubin Urine NEGATIVE NEGATIVE   Ketones, ur NEGATIVE NEGATIVE mg/dL   Protein, ur NEGATIVE NEGATIVE mg/dL   Nitrite NEGATIVE NEGATIVE   Leukocytes, UA NEGATIVE NEGATIVE   RBC / HPF 6-30 0 - 5 RBC/hpf   WBC, UA NONE SEEN 0 - 5 WBC/hpf   Bacteria, UA RARE (A) NONE SEEN   Squamous Epithelial / LPF 0-5 (A) NONE SEEN   Mucous PRESENT    Amorphous Crystal PRESENT   Basic metabolic panel     Status: Abnormal   Collection Time: 10/12/16  5:40 PM  Result Value Ref Range   Sodium 124 (L) 135 - 145  mmol/L   Potassium 3.9 3.5 - 5.1 mmol/L   Chloride 93 (L) 101 - 111 mmol/L   CO2 19 (L) 22 - 32 mmol/L   Glucose, Bld 92 65 - 99 mg/dL   BUN 58 (H) 6 - 20 mg/dL   Creatinine, Ser 2.31 (H) 0.44 - 1.00 mg/dL   Calcium 7.2 (L) 8.9 - 10.3 mg/dL   GFR calc non Af Amer 21 (L) >60 mL/min   GFR calc Af Amer 24 (L) >60 mL/min    Comment: (NOTE) The eGFR has been calculated using the CKD EPI equation. This calculation has not been validated in all clinical situations. eGFR's persistently <60 mL/min signify possible Chronic Kidney Disease.    Anion gap 12 5 - 15  Glucose, capillary     Status: None   Collection Time: 10/12/16  8:21 PM  Result Value Ref Range   Glucose-Capillary 90 65 - 99 mg/dL  Glucose, capillary     Status: None   Collection Time: 10/12/16 11:14 PM  Result Value Ref Range   Glucose-Capillary 90 65 - 99 mg/dL   Comment 1 Capillary Specimen   Procalcitonin     Status: None   Collection Time: 10/13/16  1:30 AM  Result Value Ref Range   Procalcitonin 27.38 ng/mL    Comment:        Interpretation: PCT >= 10 ng/mL: Important systemic inflammatory response, almost exclusively due to severe bacterial sepsis or septic shock. (NOTE)         ICU PCT Algorithm               Non ICU PCT Algorithm    ----------------------------     ------------------------------         PCT < 0.25 ng/mL                 PCT < 0.1 ng/mL     Stopping of antibiotics            Stopping of antibiotics       strongly encouraged.               strongly encouraged.    ----------------------------     ------------------------------       PCT level decrease by               PCT < 0.25 ng/mL       >= 80% from peak PCT       OR PCT 0.25 - 0.5 ng/mL          Stopping of antibiotics                                             encouraged.     Stopping of antibiotics           encouraged.    ----------------------------     ------------------------------       PCT level decrease by              PCT >=  0.25 ng/mL       < 80% from peak PCT        AND PCT >= 0.5 ng/mL             Continuing antibiotics                                              encouraged.       Continuing antibiotics            encouraged.    ----------------------------     ------------------------------     PCT level increase compared          PCT > 0.5 ng/mL         with peak PCT AND          PCT >= 0.5 ng/mL             Escalation of antibiotics                                          strongly encouraged.      Escalation of antibiotics        strongly encouraged.   CBC     Status: Abnormal   Collection Time: 10/13/16  1:30 AM  Result Value Ref Range   WBC 8.0 4.0 - 10.5 K/uL   RBC 2.73 (L) 3.87 - 5.11 MIL/uL  Hemoglobin 7.2 (L) 12.0 - 15.0 g/dL   HCT 21.5 (L) 36.0 - 46.0 %   MCV 78.8 78.0 - 100.0 fL   MCH 26.4 26.0 - 34.0 pg   MCHC 33.5 30.0 - 36.0 g/dL   RDW 17.0 (H) 11.5 - 15.5 %   Platelets 222 150 - 400 K/uL  Basic metabolic panel     Status: Abnormal   Collection Time: 10/13/16  1:30 AM  Result Value Ref Range   Sodium 125 (L) 135 - 145 mmol/L   Potassium 3.5 3.5 - 5.1 mmol/L   Chloride 90 (L) 101 - 111 mmol/L   CO2 21 (L) 22 - 32 mmol/L   Glucose, Bld 89 65 - 99 mg/dL   BUN 57 (H) 6 - 20 mg/dL   Creatinine, Ser 2.17 (H) 0.44 - 1.00 mg/dL   Calcium 7.4 (L) 8.9 - 10.3 mg/dL   GFR calc non Af Amer 22 (L) >60 mL/min   GFR calc Af Amer 26 (L) >60 mL/min    Comment: (NOTE) The eGFR has been calculated using the CKD EPI equation. This calculation has not been validated in all clinical situations. eGFR's persistently <60 mL/min signify possible Chronic Kidney Disease.    Anion gap 14 5 - 15  Magnesium     Status: None   Collection Time: 10/13/16  1:30 AM  Result Value Ref Range   Magnesium 1.9 1.7 - 2.4 mg/dL  Phosphorus     Status: Abnormal   Collection Time: 10/13/16  1:30 AM  Result Value Ref Range   Phosphorus 5.3 (H) 2.5 - 4.6 mg/dL  Hepatic function panel     Status: Abnormal    Collection Time: 10/13/16  1:30 AM  Result Value Ref Range   Total Protein 5.5 (L) 6.5 - 8.1 g/dL   Albumin 2.2 (L) 3.5 - 5.0 g/dL   AST 25 15 - 41 U/L   ALT 14 14 - 54 U/L   Alkaline Phosphatase 52 38 - 126 U/L   Total Bilirubin 2.2 (H) 0.3 - 1.2 mg/dL   Bilirubin, Direct 1.3 (H) 0.1 - 0.5 mg/dL   Indirect Bilirubin 0.9 0.3 - 0.9 mg/dL  Glucose, capillary     Status: None   Collection Time: 10/13/16  3:31 AM  Result Value Ref Range   Glucose-Capillary 94 65 - 99 mg/dL  Troponin I     Status: Abnormal   Collection Time: 10/13/16  4:42 AM  Result Value Ref Range   Troponin I 0.27 (HH) <0.03 ng/mL    Comment: CRITICAL VALUE NOTED.  VALUE IS CONSISTENT WITH PREVIOUSLY REPORTED AND CALLED VALUE.  Cooxemetry Panel (carboxy, met, total hgb, O2 sat)     Status: Abnormal   Collection Time: 10/13/16  5:50 AM  Result Value Ref Range   Total hemoglobin 7.3 (L) 12.0 - 16.0 g/dL   O2 Saturation 78.1 %   Carboxyhemoglobin 1.4 0.5 - 1.5 %   Methemoglobin 0.8 0.0 - 1.5 %  Glucose, capillary     Status: None   Collection Time: 10/13/16  8:54 AM  Result Value Ref Range   Glucose-Capillary 87 65 - 99 mg/dL   Comment 1 Venous Specimen     ECG   Atrial flutter, RBBB, 2:1 conduction at 120 - Personally Reviewed  Telemetry   Atrial flutter in 120's - Personally Reviewed  Radiology    Ct Abdomen Pelvis Wo Contrast  Result Date: 10/22/2016 CLINICAL DATA:  Diffuse abdominal pain with distention. Status post laparoscopic bilateral inguinal,  bilateral obturator, and umbilical hernia repair with mesh on 09/28/2016. EXAM: CT ABDOMEN AND PELVIS WITHOUT CONTRAST TECHNIQUE: Multidetector CT imaging of the abdomen and pelvis was performed following the standard protocol without IV contrast. COMPARISON:  08/27/2015 FINDINGS: Lower chest: Heart is markedly enlarged. There is compressive atelectasis in the lower lungs. Probable small volume fluid in the right major fissure. Hepatobiliary: Insert normal  noncontrast liver gallbladder not well seen. No intrahepatic or extrahepatic biliary dilation. Pancreas: No focal mass lesion. No dilatation of the main duct. No intraparenchymal cyst. No peripancreatic edema. Spleen: No splenomegaly. No focal mass lesion. Adrenals/Urinary Tract: No adrenal nodule or mass. No stones seen in either kidney. No hydronephrosis. The urinary bladder appears normal for the degree of distention. Stomach/Bowel: Stomach not well seen. No small bowel dilatation. No colonic dilatation. Vascular/Lymphatic: There is abdominal aortic atherosclerosis without aneurysm. Limited assessment for intraperitoneal lymphadenopathy given congestion, edema, fluid, and lack of intravenous contrast material. Reproductive: Fibroid change noted in the uterus. No definite adnexal mass. Other: Moderate volume ascites. Musculoskeletal: Fluid collection is identified deep to the rectus sheath, starting just above the umbilicus and tracking caudally down towards the symphysis pubis. This is extraperitoneal. There is another fluid collection on the antro lateral right lower abdominal wall the contains debris and scattered gas bubbles (see image 34 series 6 and image 51 series 6). This does not appear to represent gas within bowel and could be gas with an and hematoma raising the question of superinfection. 6 x 9 cm fluid collection identified in the left groin. 9 x 10 cm heterogeneous fluid collection identified in the right groin.Bone windows reveal no worrisome lytic or sclerotic osseous lesions. IMPRESSION: 1. Moderate volume ascites. 2. Interval development of an apparent extraperitoneal fluid collection tracking deep to the rectus sheath beginning just cranial to the umbilicus and tracking down towards the symphysis pubis. Given the history of surgery in this region, postoperative seroma/hematoma would be a consideration. Infection not excluded. 3. Fluid collection tracking along the anterolateral right lower  abdominal wall appears to be extraperitoneal although this is difficult to evaluate given the lack of intravenous and oral contrast. Gas contained within this collection does not appear consistent with an intraluminal location raising the question of gas within a hematoma or seroma. As such, superinfection of this collection is a concern. 4. Bilateral fluid collections identified in the groin regions. Patient is status post recent bilateral groin hernia repair and imaging features are compatible with recurrence. The collection in the right groin areas heterogeneous and may represent clot. No definite bowel within the groin collections. 5. Moderate ascites. 6. Marked cardiomegaly Electronically Signed   By: Misty Stanley M.D.   On: 11/01/2016 13:27   Dg Chest 1 View  Result Date: 10/12/2016 CLINICAL DATA:  Dyspnea today, history hypertension, coronary artery disease, CHF, paroxysmal atrial flutter, prior NSTEMI, nonalcoholic cirrhosis EXAM: CHEST 1 VIEW COMPARISON:  Portable exam 1710 hours compared to 10/26/2016 FINDINGS: RIGHT jugular central venous catheter with tip projecting over upper RIGHT atrium or cavoatrial junction. LEFT subclavian transvenous AICD lead projects over RIGHT ventricle unchanged. Enlargement of cardiac silhouette with pulmonary vascular congestion. Atherosclerotic calcification aorta. Mild persistent atelectasis at RIGHT base. Persistent atelectasis versus consolidation in LEFT lower lobe, cannot exclude component of LEFT pleural effusion. Upper lungs clear. No pneumothorax. IMPRESSION: Enlargement of cardiac silhouette with pulmonary vascular congestion. Persistent atelectasis versus consolidation LEFT lower lobe with suspect small LEFT pleural effusion. Persistent mild RIGHT basilar atelectasis. Electronically Signed   By: Elta Guadeloupe  Thornton Papas M.D.   On: 10/12/2016 17:23   Dg Chest Port 1 View  Result Date: 10/13/2016 CLINICAL DATA:  Shortness of breath and respiratory failure. EXAM:  PORTABLE CHEST 1 VIEW COMPARISON:  10/12/2016 FINDINGS: Stable enlargement of the cardiac silhouette. Right jugular central line is near the superior cavoatrial junction. Stable position of the left cardiac ICD. Hazy densities at the right lung base may represent atelectasis. Limited evaluation of the left lung base due to elevation of left hemidiaphragm and overlying ICD battery. Right humeral head is elevated and suggestive for rotator cuff disease. No pulmonary edema. IMPRESSION: Subtle densities at the right lung base which may represent atelectasis. Stable cardiomegaly. Limited evaluation of the left lung base. Cannot exclude consolidation or pathology in this area. No significant change from the recent comparison. Electronically Signed   By: Markus Daft M.D.   On: 10/13/2016 08:08   Dg Chest Port 1 View  Result Date: 10/14/2016 CLINICAL DATA:  Central line placement. EXAM: PORTABLE CHEST 1 VIEW COMPARISON:  10/09/2016. FINDINGS: Right IJ central line tip projects at the SVC RA junction. Single lead ICD device projects over the right ventricle. Heart is moderately enlarged. Lungs are somewhat low in volume with bibasilar atelectasis. No definite pleural fluid. No pneumothorax. IMPRESSION: 1. Right IJ central line tip projects at the SVC RA junction. No pneumothorax. 2. Bibasilar atelectasis. Electronically Signed   By: Lorin Picket M.D.   On: 10/05/2016 16:41   Dg Chest Portable 1 View  Result Date: 10/10/2016 CLINICAL DATA:  Hypotension EXAM: PORTABLE CHEST 1 VIEW COMPARISON:  09/30/2016 FINDINGS: Chronic marked cardiopericardial enlargement. Poorly visualized bases: There is atelectasis on contemporaneous abdominal CT. No evidence of edema, effusion, or pneumothorax. Single chamber ICD/ pacer into the right ventricle. IMPRESSION: 1. Bibasilar atelectasis. 2. Chronic marked cardiomegaly. Electronically Signed   By: Monte Fantasia M.D.   On: 10/08/2016 13:51    Cardiac Studies   LV EF: 30% -    35%  ------------------------------------------------------------------- History:   PMH:  Elevated Troponin.  Coronary artery disease. Congestive heart failure.  Aortic valve disease.  PMH:   Myocardial infarction.  Risk factors:  Hypertension.  ------------------------------------------------------------------- Study Conclusions  - Left ventricle: The cavity size was normal. Systolic function was   moderately to severely reduced. The estimated ejection fraction   was in the range of 30% to 35%. Wall motion was normal; there   were no regional wall motion abnormalities. Doppler parameters   are consistent with abnormal left ventricular relaxation (grade 1   diastolic dysfunction). - Ventricular septum: Septal motion showed abnormal function and   dyssynergy. - Aortic valve: There was moderate regurgitation. - Mitral valve: There was mild regurgitation. - Left atrium: The atrium was mildly dilated. - Right ventricle: The cavity size was mildly dilated. Wall   thickness was normal. - Right atrium: The atrium was severely dilated. - Tricuspid valve: There was mild regurgitation. - Pulmonary arteries: Systolic pressure was mildly increased. PA   peak pressure: 42 mm Hg (S).  Impressions:  - EF is mildly improved when compared to prior. (20%)  Assessment   1. Principal Problem: 2.   Cardiogenic shock ([place]) 3. Active Problems: 4.   Elevated troponin 5.   CAD S/P PCI April 2016 6.   Aortic insufficiency 7.   Ischemic cardiomyopathy 8.   Chronic combined systolic and diastolic CHF (congestive heart failure) ([place]) 9.   ICD (implantable cardioverter-defibrillator) in place 10.   Hypertensive heart disease 11.   Bilateral inguinal  hernias s/p lap repair with mesh 09/28/2016 12.   Obturator hernias, bilateral, s/p lap repair with mesh 09/28/2016 13.   Umbilical hernia s/p lap repair with mesh 09/28/2016 14.   Constipation, chronic 15.   Cirrhosis, nonalcoholic 16.   Poorly  controlled ascites 17.   Acute ST elevation myocardial infarction (STEMI) of inferior wall (HCC) 18.   BLQ abdominal wall old hernia sac seromas - LARGE 19.   Septic shock ([place]) 20.   Pressure injury of skin 21.   Abdominal pain 22.   Acute pulmonary edema (HCC) 23.   AKI (acute kidney injury) ([place]) 24.   ARF (acute renal failure) ([place]) 25.   Plan   1. Has been persistently in atrial flutter since yesterday at 2:1 conduction with RBBB. Underlying cardiomyopathy with EF 30%, however, excellent CO-ox at 78 (on milrinone and levophed) - would plan to preferentially wean levophed. Better rate control is necessary. Restart home carvedilol - she is not anticoagulated and I would hesitate to use amiodarone unless absolutely necessary since it may increase the rate of conversion and therefore stroke risk. I would consider starting IV heparin, as soon as practical from a surgical standpoint.  Thanks for re-involving [place]. Cardiology will follow with you.  Time Spent Directly with Patient:  25 minutes  Length of Stay:  LOS: 2 days   Pixie Casino, MD, [place]  Attending Cardiologist  Direct Dial: [phone]  Fax: [phone]  Website:  [email or web address] 10/13/2016, 9:10 AM

## 2016-10-13 NOTE — Progress Notes (Signed)
eLink Physician-Brief Progress Note Patient Name: Romona CurlsCarolyn Echeverry DOB: 04-01-1951 MRN: [id number]   Date of Service  10/13/2016  HPI/Events of Note    eICU Interventions  KCL given     Intervention Category Intermediate Interventions: Electrolyte abnormality - evaluation and management  Quinntin Malter S. 10/13/2016, 3:23 AM

## 2016-10-13 NOTE — Progress Notes (Signed)
PULMONARY / CRITICAL CARE MEDICINE   Name: Sheri Dunn MRN: [id number] DOB: [birth year]    ADMISSION DATE:  2016/10/19 CONSULTATION DATE:  2016/10/19  REFERRING MD:  Dr. Rush Landmark, EDP  CHIEF COMPLAINT:  STEMI  HISTORY OF PRESENT ILLNESS:   66 year old female with PMH of Combined CHF (EF 20-25%) s/p Medtronic ICD (2016), CAD s/p NSTEMI and PCI, Dyspnea, HTN, A.Flutter, and recent admission 4/26 for extensive hernia repair at 4 different sites.   Presents 5/9 with mental status changes, weakness, and hypotension. This morning patient reported lightheadedness and loss of vision. Reports that ever since surgery has had problems with hypotension and hypoglycemia. EKG revealed inferior ST elevation with reciprocal changes. Code STEMI activated. Cardiology consulted. Patient taken to Cath-Lab and given 3,000u of unfractionated heparin, it was then noted that the bulging of hernia had increased in size, with concern for internal bleed cath was held and patient taken back to ED. CT A/P revealed extraperitoneal fluid collection tracking deep to the rectus sheath, questioning seroma/hematoma not excluding infection. Surgery consulted. In ED patient had progressive hypotension, was given 1L bolus NS without improvement, requiring levophed gtt. PCCM asked to admit.    SUBJECTIVE:  Denies chest pain.  Feels better. On lower levophed drip. Went into atrial flutter overnight. Cards consulted.  Diurescing with lasix Still on milrinone drip    VITAL SIGNS: BP 116/68   Pulse (!) 122   Temp 98 F (36.7 C) (Oral)   Resp (!) 27   Ht 5\' 6"  (1.676 m)   Wt 59 kg (130 lb)   SpO2 97%   BMI 20.98 kg/m   HEMODYNAMICS: CVP:  [8 mmHg-15 mmHg] 15 mmHg  VENTILATOR SETTINGS: FiO2 (%):  [40 %] 40 %  INTAKE / OUTPUT: I/O last 3 completed shifts: In: 4418.6 [P.O.:960; I.V.:3108.6; IV Piggyback:350] Out: 1785 [Urine:1785]  PHYSICAL EXAMINATION: General:  Adult female, NAD,  awake, oriented x 3 Neuro:   Alert and oriented, follows commands, pupils intact   HEENT:  Normocephalic   Cardiovascular: good s1/s2. (-) s3.m.r.g Lungs:  Diminished to bases, mild tachypnea. Crackles at bases.  Abdomen:  Enlarged hernia at pubic region and left lower region. Mild epugastric tenderness/fullness Musculoskeletal:  No acute  Skin:  Warm, dry, intact . Gr 1 edema  LABS:  BMET  Recent Labs Lab 10/12/16 0454 10/12/16 1740 10/13/16 0130  NA 126* 124* 125*  K 4.0 3.9 3.5  CL 93* 93* 90*  CO2 22 19* 21*  BUN 56* 58* 57*  CREATININE 2.56* 2.31* 2.17*  GLUCOSE 167* 92 89    Electrolytes  Recent Labs Lab 10/12/16 0454 10/12/16 1740 10/13/16 0130  CALCIUM 7.2* 7.2* 7.4*  MG 1.5*  --  1.9  PHOS 4.8*  --  5.3*    CBC  Recent Labs Lab 10/12/16 0454 10/12/16 1254 10/13/16 0130  WBC 2.9* 3.0* 8.0  HGB 7.6* 7.7* 7.2*  HCT 22.8* 22.6* 21.5*  PLT 278 238 222    Coag's  Recent Labs Lab 10-19-2016 1237  APTT 91*  INR 1.80    Sepsis Markers  Recent Labs Lab Oct 19, 2016 1325 Oct 19, 2016 1417 Oct 19, 2016 1600 Oct 19, 2016 1924 10/12/16 0454 10/13/16 0130  LATICACIDVEN 5.27*  --  3.0* 2.7*  --   --   PROCALCITON  --  37.31  --   --  32.02 27.38    ABG No results for input(s): PHART, PCO2ART, PO2ART in the last 168 hours.  Liver Enzymes  Recent Labs Lab 10-19-2016 1237 10/13/16 0130  AST  31 25  ALT 14 14  ALKPHOS 62 52  BILITOT 2.4* 2.2*  ALBUMIN 2.5* 2.2*    Cardiac Enzymes  Recent Labs Lab 10/12/16 0304 10/13/16 0442 10/13/16 1024  TROPONINI 0.39* 0.27* 0.23*    Glucose  Recent Labs Lab 10/12/16 1225 10/12/16 1608 10/12/16 2021 10/12/16 2314 10/13/16 0331 10/13/16 0854  GLUCAP 95 74 90 90 94 87    Imaging Dg Chest 1 View  Result Date: 10/12/2016 CLINICAL DATA:  Dyspnea today, history hypertension, coronary artery disease, CHF, paroxysmal atrial flutter, prior NSTEMI, nonalcoholic cirrhosis EXAM: CHEST 1 VIEW COMPARISON:  Portable exam 1710 hours compared  to 10/09/2016 FINDINGS: RIGHT jugular central venous catheter with tip projecting over upper RIGHT atrium or cavoatrial junction. LEFT subclavian transvenous AICD lead projects over RIGHT ventricle unchanged. Enlargement of cardiac silhouette with pulmonary vascular congestion. Atherosclerotic calcification aorta. Mild persistent atelectasis at RIGHT base. Persistent atelectasis versus consolidation in LEFT lower lobe, cannot exclude component of LEFT pleural effusion. Upper lungs clear. No pneumothorax. IMPRESSION: Enlargement of cardiac silhouette with pulmonary vascular congestion. Persistent atelectasis versus consolidation LEFT lower lobe with suspect small LEFT pleural effusion. Persistent mild RIGHT basilar atelectasis. Electronically Signed   By: Ulyses Southward M.D.   On: 10/12/2016 17:23   Dg Chest Port 1 View  Result Date: 10/13/2016 CLINICAL DATA:  Shortness of breath and respiratory failure. EXAM: PORTABLE CHEST 1 VIEW COMPARISON:  10/12/2016 FINDINGS: Stable enlargement of the cardiac silhouette. Right jugular central line is near the superior cavoatrial junction. Stable position of the left cardiac ICD. Hazy densities at the right lung base may represent atelectasis. Limited evaluation of the left lung base due to elevation of left hemidiaphragm and overlying ICD battery. Right humeral head is elevated and suggestive for rotator cuff disease. No pulmonary edema. IMPRESSION: Subtle densities at the right lung base which may represent atelectasis. Stable cardiomegaly. Limited evaluation of the left lung base. Cannot exclude consolidation or pathology in this area. No significant change from the recent comparison. Electronically Signed   By: Richarda Overlie M.D.   On: 10/13/2016 08:08     STUDIES:  CT A/P 5/9 > Moderate Volume Ascites , interval development of an apparent extraperitoneal fluid collection tracking deep to the rectus sheath beginning just cranial to the umbilicus and tracking down towards  the symphysis pubis. Seroma/Hematoma consideration, infection not excluded, fluid collection tracking along the anterolateral right lower abdominal wall appears to be extraperitoneal although, bilateral fluid collections identified in the groin regions, moderate ascites, cardiomegaly  CXR 5/9 > bibasilar atelectasis, chronic marked cardiomegaly   CULTURES: Blood 5/9 >> Ecoli   ANTIBIOTICS: Vancomycin 5/9 >> 5/10 Zosyn 5/9 >> 5/10 Clindamycin 5/9 >> 5/10 Rocephin 5/10 >   SIGNIFICANT EVENTS: 5/9 > Presents to ED > Code STEMI   LINES/TUBES: R IJ 5/9 >   DISCUSSION: 66 year old female with extensive cardiac history presents to ED on 5/9 as Code STEMI. While in Cath-lab after being administered heparin she was noted to have increase in size of hernias, with concern for internal bleeding cath was held and patient was taken back to ED. In ED patient had significant hypotension requiring levophed gtt. Has Ecoli bacteremia  ASSESSMENT / PLAN:  PULMONARY A: Mild Pulmonary edema P:   SOB overnight but improved with diuresis Maintain saturation >90 Pulmonary Hygiene    CARDIOVASCULAR A:  STEMI Hypotension related to STEMI + sepsis (Ecoli in blood) H/O CHF (EF 20-25), CAD S/P ICD, A.Flutter  Mild Pulm Edema P:  On levophed + milrinone.  Weaning off levophed.  Continue with diuresis Cardiac Monitoring  Hold home Metoprolol , Lipitor, aldactone, ASA  For now.  Restart when taking PO Appreciate cardiology recommendations   RENAL A:   Acute Kidney Injury 2/2 shock ( STEMI + sepsis with Ecoli bacteremia) Lactic Acidosis  P:   Cont lasix.  Will increase dose today to 40 q12 and assess in am. Creatinine better.  Trend BMP Replace electrolytes as needed    GASTROINTESTINAL A:   Cirrhosis  Malnutrition, moderate P:   Cont PO diet PPI   HEMATOLOGIC A:   Leukopenia  Concern for abd hematoma Anemia P:  Will transfuse 1 u pRBC. Check Hb and Hct post trasnfusion Trend  CBC  Hold anticoagulation in setting of possible internal abd bleeding.   INFECTIOUS A:   Septic Shock 2/2 Ecoli bacteremia, likely from abdominal source P:   Cont Rocephin Follow Culture data  Cont stress dose steroids for septic shock Cont levophed >> levophed being tapered down Surgery following   ENDOCRINE A:   Hypoglycemia . resolved  P:   Trend Glucose  Cont diet as tolerated.    NEUROLOGIC A:   Acute Encephalopathy. Improved.  P:   RASS goal: 0 Monitor  Hold sedating medications   FAMILY  - Updates: Family at bedside, updated on plan on 5/10  - Inter-disciplinary family meet or Palliative Care meeting due by: 5/16    CC Time: 30  minutes    J. Alexis FrockAngelo A de Dios, MD 10/13/2016, 11:18 AM [facility] Pager [phone] After 3 pm or if no answer, call [phone]

## 2016-10-13 NOTE — Progress Notes (Signed)
eLink Physician-Brief Progress Note Patient Name: Sheri CurlsCarolyn Borgeson DOB: May 15, 1951 MRN: [id number]   Date of Service  10/13/2016  HPI/Events of Note  Sinus tachy to 120's. Would like to have better rate control in aftermath MI, but may not be possible given the adrenergic drive of her WOB, BiPAP, etc.   eICU Interventions  Try another dose metoprolol now. Adjust pressors if needed. Agree w diuresis. CXR in am     Intervention Category Intermediate Interventions: Arrhythmia - evaluation and management;Other:  Deleah Tison S. 10/13/2016, 12:29 AM
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
## 2016-10-13 NOTE — Progress Notes (Signed)
Initial Nutrition Assessment  INTERVENTION:   Ensure Enlive po BID, each supplement provides 350 kcal and 20 grams of protein  NUTRITION DIAGNOSIS:   Inadequate oral intake related to  (decreased appetite) as evidenced by per patient/family report.  GOAL:   Patient will meet greater than or equal to 90% of their needs  MONITOR:   PO intake, Supplement acceptance, Weight trends  REASON FOR ASSESSMENT:   Malnutrition Screening Tool    ASSESSMENT:   Pt with hx of CHF, s/p Medtronic ICH, CAD s/p NSTEMI and PCI, HTN, recent admission 4/26 for extensive hernia repair at 4 sites. 5/9 mental status changes admitted with STEMI.    Medications reviewed and include: miralax, solu-cortef Labs reviewed: Na 125, PO4 5.3  Spoke with family (sister) who reports weight loss but unsure of amount. Appetite decreased. Pt does like ensure.  Unable to complete Nutrition-Focused physical exam at this time.    Diet Order:  DIET - DYS 1 Room service appropriate? Yes; Fluid consistency: Thin  Skin:  Wound (see comment) (stage I sacrum)  Last BM:  5/8  Height:   Ht Readings from Last 1 Encounters:  2016-12-05 5\' 6"  (1.676 m)    Weight:   Wt Readings from Last 1 Encounters:  10/13/16 130 lb (59 kg)    Ideal Body Weight:  59 kg  BMI:  Body mass index is 20.98 kg/m.  Estimated Nutritional Needs:   Kcal:  1500-1700  Protein:  75-85 grams  Fluid:  > 1.5 L/day  EDUCATION NEEDS:   No education needs identified at this time  Kendell BaneHeather Tristain Daily RD, LDN, CNSC [phone] Pager [phone] After Hours Pager

## 2016-10-14 DIAGNOSIS — J9601 Acute respiratory failure with hypoxia: Secondary | ICD-10-CM

## 2016-10-14 LAB — GLUCOSE, CAPILLARY
GLUCOSE-CAPILLARY: 110 mg/dL — AB (ref 65–99)
GLUCOSE-CAPILLARY: 116 mg/dL — AB (ref 65–99)
GLUCOSE-CAPILLARY: 162 mg/dL — AB (ref 65–99)
Glucose-Capillary: 153 mg/dL — ABNORMAL HIGH (ref 65–99)
Glucose-Capillary: 159 mg/dL — ABNORMAL HIGH (ref 65–99)
Glucose-Capillary: 97 mg/dL (ref 65–99)

## 2016-10-14 LAB — CBC
HCT: 25.4 % — ABNORMAL LOW (ref 36.0–46.0)
HEMOGLOBIN: 8.7 g/dL — AB (ref 12.0–15.0)
MCH: 26.9 pg (ref 26.0–34.0)
MCHC: 34.3 g/dL (ref 30.0–36.0)
MCV: 78.6 fL (ref 78.0–100.0)
PLATELETS: 208 10*3/uL (ref 150–400)
RBC: 3.23 MIL/uL — AB (ref 3.87–5.11)
RDW: 16.7 % — ABNORMAL HIGH (ref 11.5–15.5)
WBC: 15 10*3/uL — AB (ref 4.0–10.5)

## 2016-10-14 LAB — COOXEMETRY PANEL
Carboxyhemoglobin: 1.9 % — ABNORMAL HIGH (ref 0.5–1.5)
Methemoglobin: 1 % (ref 0.0–1.5)
O2 Saturation: 60.6 %
Total hemoglobin: 8.8 g/dL — ABNORMAL LOW (ref 12.0–16.0)

## 2016-10-14 LAB — PHOSPHORUS: Phosphorus: 5 mg/dL — ABNORMAL HIGH (ref 2.5–4.6)

## 2016-10-14 LAB — TYPE AND SCREEN
ABO/RH(D): AB POS
ANTIBODY SCREEN: NEGATIVE
UNIT DIVISION: 0

## 2016-10-14 LAB — CULTURE, BLOOD (ROUTINE X 2): Special Requests: ADEQUATE

## 2016-10-14 LAB — BASIC METABOLIC PANEL
ANION GAP: 12 (ref 5–15)
BUN: 64 mg/dL — ABNORMAL HIGH (ref 6–20)
CHLORIDE: 91 mmol/L — AB (ref 101–111)
CO2: 22 mmol/L (ref 22–32)
Calcium: 8 mg/dL — ABNORMAL LOW (ref 8.9–10.3)
Creatinine, Ser: 2.27 mg/dL — ABNORMAL HIGH (ref 0.44–1.00)
GFR calc Af Amer: 25 mL/min — ABNORMAL LOW (ref >60)
GFR, EST NON AFRICAN AMERICAN: 21 mL/min — AB (ref >60)
GLUCOSE: 111 mg/dL — AB (ref 65–99)
POTASSIUM: 3.7 mmol/L (ref 3.5–5.1)
Sodium: 125 mmol/L — ABNORMAL LOW (ref 135–145)

## 2016-10-14 LAB — MAGNESIUM: MAGNESIUM: 1.8 mg/dL (ref 1.7–2.4)

## 2016-10-14 LAB — HEPARIN LEVEL (UNFRACTIONATED)
HEPARIN UNFRACTIONATED: 0.17 [IU]/mL — AB (ref 0.30–0.70)
Heparin Unfractionated: 0.22 IU/mL — ABNORMAL LOW (ref 0.30–0.70)

## 2016-10-14 LAB — BPAM RBC
BLOOD PRODUCT EXPIRATION DATE: 201805222359
ISSUE DATE / TIME: 201805111333
UNIT TYPE AND RH: 8400

## 2016-10-14 MED ORDER — MILRINONE LACTATE IN DEXTROSE 20-5 MG/100ML-% IV SOLN
0.2500 ug/kg/min | INTRAVENOUS | Status: DC
  Administered 2016-10-15 – 2016-10-16 (×3): 0.25 ug/kg/min via INTRAVENOUS
  Filled 2016-10-14 (×4): qty 100

## 2016-10-14 MED ORDER — MAGNESIUM HYDROXIDE 400 MG/5ML PO SUSP
30.0000 mL | Freq: Every day | ORAL | Status: DC | PRN
  Administered 2016-10-14: 30 mL via ORAL
  Filled 2016-10-14: qty 30

## 2016-10-14 MED ORDER — DEXTROSE 5 % IV SOLN
120.0000 mg | Freq: Once | INTRAVENOUS | Status: AC
  Administered 2016-10-14: 120 mg via INTRAVENOUS
  Filled 2016-10-14: qty 12

## 2016-10-14 MED ORDER — AMIODARONE HCL IN DEXTROSE 360-4.14 MG/200ML-% IV SOLN
30.0000 mg/h | INTRAVENOUS | Status: DC
  Administered 2016-10-14 – 2016-10-15 (×3): 30 mg/h via INTRAVENOUS
  Filled 2016-10-14 (×3): qty 200

## 2016-10-14 MED ORDER — FLEET ENEMA 7-19 GM/118ML RE ENEM
1.0000 | ENEMA | Freq: Once | RECTAL | Status: AC
  Administered 2016-10-14: 1 via RECTAL
  Filled 2016-10-14: qty 1

## 2016-10-14 NOTE — Progress Notes (Signed)
PT Cancellation Note  Patient Details Name: Romona CurlsCarolyn Mundis MRN: [id number] DOB: 1950/11/02   Cancelled Treatment:    Reason Eval/Treat Not Completed: Other (comment).  Pt nauseated right now, does not fee up to getting up.  Per RN she has been up walking earlier today.  PT to check back tomorrow.   Thanks,    Rollene Rotundaebecca B. Lexys Milliner, PT, DPT [phone]   10/14/2016, 3:01 PM

## 2016-10-14 NOTE — Progress Notes (Addendum)
Pt noted to have a decreased urinary output of 80ml from 2000 to 00. RN irrigated foley cath and verified placement of balloon, repositioned foley cath. No new urinary output noted. Dr. Audelia Hiveseterding, ELink MD and updated on patients status. No new orders received at this time, MD stated that "we will just watch her tonight". RN will continue to monitor.

## 2016-10-14 NOTE — Progress Notes (Signed)
ANTICOAGULATION CONSULT NOTE - Follow Up Consult  Pharmacy Consult for Heparin Indication: atrial fibrillation  Allergies  Allergen Reactions  . No Known Allergies     Patient Measurements: Height: 5\' 6"  (167.6 cm) Weight: 136 lb 3.9 oz (61.8 kg) IBW/kg (Calculated) : 59.3 Heparin Dosing Weight: 61.8 kg  Vital Signs: Temp: 97.6 F (36.4 C) (05/12 1559) Temp Source: Axillary (05/12 1550) BP: 92/49 (05/12 1430) Pulse Rate: 122 (05/12 1430)  Labs:  Recent Labs  10/12/16 1254  10/13/16 0130 10/13/16 0442 10/13/16 1024 10/13/16 1629 10/13/16 1848 10/14/16 0420 10/14/16 0500 10/14/16 1552  HGB 7.7*  --  7.2*  --   --  8.2*  --  8.7*  --   --   HCT 22.6*  --  21.5*  --   --  23.9*  --  25.4*  --   --   PLT 238  --  222  --   --   --   --  208  --   --   HEPARINUNFRC  --   --   --   --   --   --  <0.10*  --  0.17* 0.22*  CREATININE  --   < > 2.17*  --   --  1.94*  --  2.27*  --   --   TROPONINI  --   --   --  0.27* 0.23* 0.20*  --   --   --   --   < > = values in this interval not displayed.  Estimated Creatinine Clearance: 22.5 mL/min (A) (by C-G formula based on SCr of 2.27 mg/dL (H)).  Assessment:  [age] YOM admitted 5/9 as a code STEMI. Heparin bolus was given in ED and she was taken to cath lab, where cath could not be done due to thigh hematomas. She had recently had extensive hernia repairs on 4/26. Surgery evaluated the patient on 5/11 and okayed starting heparin for atrial fibrillation.  Heparin level remains subtherapuetic (0.22 units/mL) on 1100 units/h this afternoon. Confirmed with RN no stops in infusion or issues with line, running at correct rate. Hgb trended up to 8.7.  No known bleeding.  Goal of Therapy:  Heparin level 0.3-0.7 units/ml Monitor platelets by anticoagulation protocol: Yes   Plan:   Increase heparin drip to 1250 units/hr.  No heparin boluses due to recent thigh hematomas.  Daily heparin level and CBC.  Zakhai Meisinger D. Shekia Kuper, PharmD,  BCPS Clinical Pharmacist Pager: [phone] 10/14/2016 4:32 PM

## 2016-10-14 NOTE — Progress Notes (Signed)
Patient is resting comfortably on 2L [place] with no respiratory distress noted. BIPAP is not needed at this time. RT will monitor as needed. 

## 2016-10-14 NOTE — Progress Notes (Signed)
Progress Note  Patient Name: Sheri CurlsCarolyn Dunn Date of Encounter: 10/14/2016  Primary Cardiologist: Dr. Royann Shiversroitoru  Subjective   The patient says that she has some mild abdominal pain but overall is breathing OK.    Inpatient Medications    Scheduled Meds: . carvedilol  3.125 mg Oral BID WC  . hydrocortisone sod succinate (SOLU-CORTEF) inj  100 mg Intravenous Q8H  . polyethylene glycol  17 g Oral BID   Continuous Infusions: . sodium chloride    . sodium chloride    . cefTRIAXone (ROCEPHIN)  IV Stopped (10/13/16 1236)  . furosemide    . heparin 1,100 Units/hr (10/14/16 [id number])  . milrinone 0.25 mcg/kg/min (10/14/16 0818)  . norepinephrine (LEVOPHED) Adult infusion 12 mcg/min (10/14/16 0755)   PRN Meds: sodium chloride, acetaminophen, acetaminophen, alum & mag hydroxide-simeth, bisacodyl, lip balm, magic mouthwash, menthol-cetylpyridinium, metoprolol, oxyCODONE, phenol, polyethylene glycol   Vital Signs    Vitals:   10/14/16 0500 10/14/16 0600 10/14/16 0700 10/14/16 0823  BP: (!) 104/52 (!) 98/51 (!) 89/55   Pulse: (!) 120 74 (!) 125   Resp: 20 (!) 25 17   Temp:    97.8 F (36.6 C)  TempSrc:    Oral  SpO2: 98% 97% 97%   Weight:      Height:        Intake/Output Summary (Last 24 hours) at 10/14/16 0912 Last data filed at 10/14/16 0700  Gross per 24 hour  Intake          1320.12 ml  Output              485 ml  Net           835.12 ml   Filed Weights   10/12/16 0500 10/13/16 0600 10/14/16 0418  Weight: 127 lb 10.3 oz (57.9 kg) 130 lb (59 kg) 136 lb 3.9 oz (61.8 kg)    Telemetry    Atrial flutter with 2:1 conduction and ectopy.  - Personally Reviewed  ECG    NA - Personally Reviewed  Physical Exam   GEN: No acute distress.   She is actually sitting up and looks OK.  Neck: No  JVD Cardiac: RRR, no murmurs, rubs, or gallops.  Respiratory: Clear  to auscultation bilaterally. GI: Soft, nontender, distended  MS: No  edema; No deformity. Neuro:  Nonfocal    Psych: Normal affect   Labs    Chemistry Recent Labs Lab 2016/06/24 1237  10/13/16 0130 10/13/16 1629 10/14/16 0420  NA 132*  < > 125* 125* 125*  K 4.6  < > 3.5 3.8 3.7  CL 98*  < > 90* 92* 91*  CO2 17*  < > 21* 18* 22  GLUCOSE 43*  < > 89 140* 111*  BUN 53*  < > 57* 57* 64*  CREATININE 2.63*  < > 2.17* 1.94* 2.27*  CALCIUM 8.0*  < > 7.4* 7.6* 8.0*  PROT 5.8*  --  5.5*  --   --   ALBUMIN 2.5*  --  2.2*  --   --   AST 31  --  25  --   --   ALT 14  --  14  --   --   ALKPHOS 62  --  52  --   --   BILITOT 2.4*  --  2.2*  --   --   GFRNONAA 18*  < > 22* 26* 21*  GFRAA 21*  < > 26* 30* 25*  ANIONGAP 17*  < >  14 15 12   < > = values in this interval not displayed.   Hematology Recent Labs Lab 10/12/16 1254 10/13/16 0130 10/13/16 1629 10/14/16 0420  WBC 3.0* 8.0  --  15.0*  RBC 2.84* 2.73*  --  3.23*  HGB 7.7* 7.2* 8.2* 8.7*  HCT 22.6* 21.5* 23.9* 25.4*  MCV 79.6 78.8  --  78.6  MCH 27.1 26.4  --  26.9  MCHC 34.1 33.5  --  34.3  RDW 17.1* 17.0*  --  16.7*  PLT 238 222  --  208    Cardiac Enzymes Recent Labs Lab 10/12/16 0304 10/13/16 0442 10/13/16 1024 10/13/16 1629  TROPONINI 0.39* 0.27* 0.23* 0.20*    Recent Labs Lab 10/13/2016 1204  TROPIPOC 0.88*     BNPNo results for input(s): BNP, PROBNP in the last 168 hours.   DDimer No results for input(s): DDIMER in the last 168 hours.   Radiology    Dg Chest 1 View  Result Date: 10/12/2016 CLINICAL DATA:  Dyspnea today, history hypertension, coronary artery disease, CHF, paroxysmal atrial flutter, prior NSTEMI, nonalcoholic cirrhosis EXAM: CHEST 1 VIEW COMPARISON:  Portable exam 1710 hours compared to 10/12/2016 FINDINGS: RIGHT jugular central venous catheter with tip projecting over upper RIGHT atrium or cavoatrial junction. LEFT subclavian transvenous AICD lead projects over RIGHT ventricle unchanged. Enlargement of cardiac silhouette with pulmonary vascular congestion. Atherosclerotic calcification aorta. Mild  persistent atelectasis at RIGHT base. Persistent atelectasis versus consolidation in LEFT lower lobe, cannot exclude component of LEFT pleural effusion. Upper lungs clear. No pneumothorax. IMPRESSION: Enlargement of cardiac silhouette with pulmonary vascular congestion. Persistent atelectasis versus consolidation LEFT lower lobe with suspect small LEFT pleural effusion. Persistent mild RIGHT basilar atelectasis. Electronically Signed   By: Ulyses Southward M.D.   On: 10/12/2016 17:23   Dg Chest Port 1 View  Result Date: 10/13/2016 CLINICAL DATA:  Shortness of breath and respiratory failure. EXAM: PORTABLE CHEST 1 VIEW COMPARISON:  10/12/2016 FINDINGS: Stable enlargement of the cardiac silhouette. Right jugular central line is near the superior cavoatrial junction. Stable position of the left cardiac ICD. Hazy densities at the right lung base may represent atelectasis. Limited evaluation of the left lung base due to elevation of left hemidiaphragm and overlying ICD battery. Right humeral head is elevated and suggestive for rotator cuff disease. No pulmonary edema. IMPRESSION: Subtle densities at the right lung base which may represent atelectasis. Stable cardiomegaly. Limited evaluation of the left lung base. Cannot exclude consolidation or pathology in this area. No significant change from the recent comparison. Electronically Signed   By: Richarda Overlie M.D.   On: 10/13/2016 08:08    Cardiac Studies   Echo:  Study Conclusions  - Left ventricle: The cavity size was normal. Systolic function was moderately to severely reduced. The estimated ejection fraction was in the range of 30% to 35%. Wall motion was normal; there were no regional wall motion abnormalities. Doppler parameters are consistent with abnormal left ventricular relaxation (grade 1 diastolic dysfunction). - Ventricular septum: Septal motion showed abnormal function and dyssynergy. - Aortic valve: There was moderate  regurgitation. - Mitral valve: There was mild regurgitation. - Left atrium: The atrium was mildly dilated. - Right ventricle: The cavity size was mildly dilated. Wall thickness was normal. - Right atrium: The atrium was severely dilated. - Tricuspid valve: There was mild regurgitation. - Pulmonary arteries: Systolic pressure was mildly increased. PA peak pressure: 42 mm Hg (S).  Impressions:  - EF is mildly improved when compared to prior. (  20%)  Patient Profile     66 y.o. female with a history of severe ischemic cardiomyopathy, chronic systolic and diastolic biventricular HF, ascites with seroma, CAD s/p NSTEMI and DES to RCA and LCX 09/2014, s/p ICD November 2016 (Medtronic Visia AF), PAFlutter, AI, HLD, who presented to [facility] on 09/[facility]/16 for elective hernia repair. Cardiology consulted for perioperative hypotension and transient ECG changes. She was found to be volume overloaded and required IV diuresis.   Assessment & Plan    ATRIAL FLUTTER:  She has had paroxysmal atrial flutter but it seems to be maintaining this.    She has had rapid rate and has had to receive metoprolol despite her hypotension.   I am going to start a low dose of amiodarone IV as I think this is contributing to her worsening hemodynamics.    ARF:  Nephrology following.  See below  STEMI:  Medical management given her significant comorbid issues.  ACUTE ON CHRONIC SYSTOLIC HF:  (EF 25%).   Remains hypotensive on milrinone and Levophed drip.  CVP 11.  CoOx is 60.6.    ACUTE ON CHRONIC RENAL INSUFFICIENCY:  Creat is back up and UO output is down.  Nephrology has increased her Lasix.    Signed, Rollene Rotunda, MD  10/14/2016, 9:12 AM

## 2016-10-14 NOTE — Progress Notes (Signed)
ANTICOAGULATION CONSULT NOTE - Follow Up Consult  Pharmacy Consult for Heparin Indication: atrial fibrillation  Allergies  Allergen Reactions  . No Known Allergies     Patient Measurements: Height: 5\' 6"  (167.6 cm) Weight: 136 lb 3.9 oz (61.8 kg) IBW/kg (Calculated) : 59.3 Heparin Dosing Weight: 61.8 kg  Vital Signs: Temp: 98 F (36.7 C) (05/12 0400) Temp Source: Oral (05/12 0400) BP: 104/52 (05/12 0500) Pulse Rate: 120 (05/12 0500)  Labs:  Recent Labs  10/14/2016 1237  10/12/16 1254  10/13/16 0130 10/13/16 0442 10/13/16 1024 10/13/16 1629 10/13/16 1848 10/14/16 0420 10/14/16 0500  HGB  --   < > 7.7*  --  7.2*  --   --  8.2*  --  8.7*  --   HCT  --   < > 22.6*  --  21.5*  --   --  23.9*  --  25.4*  --   PLT  --   < > 238  --  222  --   --   --   --  208  --   APTT 91*  --   --   --   --   --   --   --   --   --   --   LABPROT 21.2*  --   --   --   --   --   --   --   --   --   --   INR 1.80  --   --   --   --   --   --   --   --   --   --   HEPARINUNFRC  --   --   --   --   --   --   --   --  <0.10*  --  0.17*  CREATININE 2.63*  < >  --   < > 2.17*  --   --  1.94*  --  2.27*  --   TROPONINI  --   < >  --   --   --  0.27* 0.23* 0.20*  --   --   --   < > = values in this interval not displayed.  Estimated Creatinine Clearance: 22.5 mL/min (A) (by C-G formula based on SCr of 2.27 mg/dL (H)).  Assessment:  [age] YOM admitted 5/9 as a code STEMI. Heparin bolus was given in ED and she was taken to cath lab, where cath could not be done due to thigh hematomas. She had recently had extensive hernia repairs on 4/26. Surgery evaluated the patient on 5/11 and okayed starting heparin for atrial fibrillation.    Heparin level remains subtherapuetic (0.17) on 900 units/hr. Hgb trended up to 8.7.  No known bleeding.  Goal of Therapy:  Heparin level 0.3-0.7 units/ml Monitor platelets by anticoagulation protocol: Yes   Plan:   Increase heparin drip to 1100 units/hr.  Heparin  level ~8 hrs after increase.  No heparin boluses due to recent thigh hematomas.  Daily heparin level and CBC.  Dennie Fettersgan, Tamyah Cutbirth Donovan, [place] Pager: [phone] 10/14/2016,6:24 AM

## 2016-10-14 NOTE — Progress Notes (Signed)
Pt noted to have non-sustained a.flutter to 160, HR currently 120's sustained.  Complaining of general discomfort and needing to have a bowel movement. Spoke with Dr. Orson AloeHenderson and updated him on pt's current status. No new orders received at this time, but told RN to call back if HR sustained at 160. RN will continue to monitor.

## 2016-10-14 NOTE — Progress Notes (Signed)
S:Hungry, says she feels better but BP dropped when off levophed and it had to be restarted and is on higher dose O:BP (!) 89/55   Pulse (!) 125   Temp 98 F (36.7 C) (Oral)   Resp 17   Ht 5\' 6"  (1.676 m)   Wt 61.8 kg (136 lb 3.9 oz)   SpO2 97%   BMI 21.99 kg/m   Intake/Output Summary (Last 24 hours) at 10/14/16 0758 Last data filed at 10/14/16 0700  Gross per 24 hour  Intake           1734.4 ml  Output              670 ml  Net           1064.4 ml   Weight change: 2.832 kg (6 lb 3.9 oz) WUJ:WJXBJ and alert YNW:GNFAO, reg Resp:clear Abd:+BS, NT minimal tenderness Ext: No edema NEURO:CNI Ox3 no asterixis Rt IJ triple lumen   . carvedilol  3.125 mg Oral BID WC  . hydrocortisone sod succinate (SOLU-CORTEF) inj  100 mg Intravenous Q8H  . polyethylene glycol  17 g Oral BID   Dg Chest 1 View  Result Date: 10/12/2016 CLINICAL DATA:  Dyspnea today, history hypertension, coronary artery disease, CHF, paroxysmal atrial flutter, prior NSTEMI, nonalcoholic cirrhosis EXAM: CHEST 1 VIEW COMPARISON:  Portable exam 1710 hours compared to 10/10/2016 FINDINGS: RIGHT jugular central venous catheter with tip projecting over upper RIGHT atrium or cavoatrial junction. LEFT subclavian transvenous AICD lead projects over RIGHT ventricle unchanged. Enlargement of cardiac silhouette with pulmonary vascular congestion. Atherosclerotic calcification aorta. Mild persistent atelectasis at RIGHT base. Persistent atelectasis versus consolidation in LEFT lower lobe, cannot exclude component of LEFT pleural effusion. Upper lungs clear. No pneumothorax. IMPRESSION: Enlargement of cardiac silhouette with pulmonary vascular congestion. Persistent atelectasis versus consolidation LEFT lower lobe with suspect small LEFT pleural effusion. Persistent mild RIGHT basilar atelectasis. Electronically Signed   By: Ulyses Southward M.D.   On: 10/12/2016 17:23   Dg Chest Port 1 View  Result Date: 10/13/2016 CLINICAL DATA:   Shortness of breath and respiratory failure. EXAM: PORTABLE CHEST 1 VIEW COMPARISON:  10/12/2016 FINDINGS: Stable enlargement of the cardiac silhouette. Right jugular central line is near the superior cavoatrial junction. Stable position of the left cardiac ICD. Hazy densities at the right lung base may represent atelectasis. Limited evaluation of the left lung base due to elevation of left hemidiaphragm and overlying ICD battery. Right humeral head is elevated and suggestive for rotator cuff disease. No pulmonary edema. IMPRESSION: Subtle densities at the right lung base which may represent atelectasis. Stable cardiomegaly. Limited evaluation of the left lung base. Cannot exclude consolidation or pathology in this area. No significant change from the recent comparison. Electronically Signed   By: Richarda Overlie M.D.   On: 10/13/2016 08:08   BMET    Component Value Date/Time   NA 125 (L) 10/14/2016 0420   K 3.7 10/14/2016 0420   CL 91 (L) 10/14/2016 0420   CO2 22 10/14/2016 0420   GLUCOSE 111 (H) 10/14/2016 0420   BUN 64 (H) 10/14/2016 0420   CREATININE 2.27 (H) 10/14/2016 0420   CREATININE 0.68 04/21/2016 1524   CALCIUM 8.0 (L) 10/14/2016 0420   GFRNONAA 21 (L) 10/14/2016 0420   GFRAA 25 (L) 10/14/2016 0420   CBC    Component Value Date/Time   WBC 15.0 (H) 10/14/2016 0420   RBC 3.23 (L) 10/14/2016 0420   HGB 8.7 (L) 10/14/2016 0420   HCT  25.4 (L) 10/14/2016 0420   PLT 208 10/14/2016 0420   MCV 78.6 10/14/2016 0420   MCH 26.9 10/14/2016 0420   MCHC 34.3 10/14/2016 0420   RDW 16.7 (H) 10/14/2016 0420   LYMPHSABS 0.8 2016/07/17 1208   MONOABS 0.4 2016/07/17 1208   EOSABS 0.0 2016/07/17 1208   BASOSABS 0.0 2016/07/17 1208     Assessment:  1. ARF secondary to hypotension in setting of E Coli sepsis 2. E Coli Sepsis on ceftriaxone 3. STEMI 4. SP recent hernia (umbilical and obturator) repairs with seroma/hematoma  Plan: 1.  Since UO dropping off (suspect due to hypotension and  higher dose of levo plus tachycardia in face of cardiomyopathy) will give larger dose of lasix and see how responds 2. Daily Scr 3. DC MOM   Franchon Ketterman T

## 2016-10-14 NOTE — Progress Notes (Signed)
PULMONARY / CRITICAL CARE MEDICINE   Name: Sheri Dunn MRN: [id number] DOB: [birth year]    ADMISSION DATE:  2016/11/08 CONSULTATION DATE:  11-08-2016  REFERRING MD:  Dr. Rush Landmark, EDP  CHIEF COMPLAINT:  STEMI  HISTORY OF PRESENT ILLNESS:   66 year old female with PMH of Combined CHF (EF 20-25%) s/p Medtronic ICD (2016), CAD s/p NSTEMI and PCI, Dyspnea, HTN, A.Flutter, and recent admission 4/26 for extensive hernia repair at 4 different sites.   Presents 5/9 with mental status changes, weakness, and hypotension. This morning patient reported lightheadedness and loss of vision. Reports that ever since surgery has had problems with hypotension and hypoglycemia. EKG revealed inferior ST elevation with reciprocal changes. Code STEMI activated. Cardiology consulted. Patient taken to Cath-Lab and given 3,000u of unfractionated heparin, it was then noted that the bulging of hernia had increased in size, with concern for internal bleed cath was held and patient taken back to ED. CT A/P revealed extraperitoneal fluid collection tracking deep to the rectus sheath, questioning seroma/hematoma not excluding infection. Surgery consulted. In ED patient had progressive hypotension, was given 1L bolus NS without improvement, requiring levophed gtt. PCCM asked to admit.    SUBJECTIVE:    VITAL SIGNS: BP (!) 89/55   Pulse (!) 125   Temp 97.8 F (36.6 C) (Oral)   Resp 17   Ht 5\' 6"  (1.676 m)   Wt 136 lb 3.9 oz (61.8 kg)   SpO2 97%   BMI 21.99 kg/m   HEMODYNAMICS: CVP:  [11 mmHg-19 mmHg] 11 mmHg  VENTILATOR SETTINGS: FiO2 (%):  [40 %] 40 %  INTAKE / OUTPUT: I/O last 3 completed shifts: In: 2111.2 [P.O.:840; I.V.:891.2; Blood:330; IV Piggyback:50] Out: 1820 [Urine:1820]  PHYSICAL EXAMINATION: General:  Thin adult female in NAD, sitting at bedside  HEENT: MM pink/moist, no jvd Neuro: AAOx4, speech clear, MAE  CV: s1s2 irr irr, no m/r/g PULM: even/non-labored, lungs bilaterally clear  WU:JWJX,  non-tender, bsx4 active  Extremities: warm/dry, no edema  Skin: no rashes or lesions   LABS:  BMET  Recent Labs Lab 10/13/16 0130 10/13/16 1629 10/14/16 0420  NA 125* 125* 125*  K 3.5 3.8 3.7  CL 90* 92* 91*  CO2 21* 18* 22  BUN 57* 57* 64*  CREATININE 2.17* 1.94* 2.27*  GLUCOSE 89 140* 111*    Electrolytes  Recent Labs Lab 10/12/16 0454  10/13/16 0130 10/13/16 1629 10/14/16 0420  CALCIUM 7.2*  < > 7.4* 7.6* 8.0*  MG 1.5*  --  1.9  --  1.8  PHOS 4.8*  --  5.3*  --  5.0*  < > = values in this interval not displayed.  CBC  Recent Labs Lab 10/12/16 1254 10/13/16 0130 10/13/16 1629 10/14/16 0420  WBC 3.0* 8.0  --  15.0*  HGB 7.7* 7.2* 8.2* 8.7*  HCT 22.6* 21.5* 23.9* 25.4*  PLT 238 222  --  208    Coag's  Recent Labs Lab 2016/11/08 1237  APTT 91*  INR 1.80    Sepsis Markers  Recent Labs Lab 08-Nov-2016 1325 2016-11-08 1417 08-Nov-2016 1600 11/08/16 1924 10/12/16 0454 10/13/16 0130  LATICACIDVEN 5.27*  --  3.0* 2.7*  --   --   PROCALCITON  --  37.31  --   --  32.02 27.38    ABG No results for input(s): PHART, PCO2ART, PO2ART in the last 168 hours.  Liver Enzymes  Recent Labs Lab 11/08/16 1237 10/13/16 0130  AST 31 25  ALT 14 14  ALKPHOS 62 52  BILITOT 2.4* 2.2*  ALBUMIN 2.5* 2.2*    Cardiac Enzymes  Recent Labs Lab 10/13/16 0442 10/13/16 1024 10/13/16 1629  TROPONINI 0.27* 0.23* 0.20*    Glucose  Recent Labs Lab 10/13/16 1324 10/13/16 1632 10/13/16 2030 10/14/16 0007 10/14/16 0322 10/14/16 0821  GLUCAP 116* 142* 136* 97 110* 116*    Imaging No results found.   STUDIES:  CT A/P 5/9 >> Moderate Volume Ascites, interval development of an apparent extraperitoneal fluid collection tracking deep to the rectus sheath beginning just cranial to the umbilicus and tracking down towards the symphysis pubis. Seroma/Hematoma consideration, infection not excluded, fluid collection tracking along the anterolateral right lower  abdominal wall appears to be extraperitoneal although, bilateral fluid collections identified in the groin regions, moderate ascites, cardiomegaly  CXR 5/9 >>  bibasilar atelectasis, chronic marked cardiomegaly   CULTURES: Blood 5/9 >> Ecoli   ANTIBIOTICS: Vancomycin 5/9 >> 5/10 Zosyn 5/9 >> 5/10 Clindamycin 5/9 >> 5/10 Rocephin 5/10 >   SIGNIFICANT EVENTS: 5/09  Admitted as Code STEMI  5/11  Remains on levo gtt, atrial flutter overnight, cardiology consulted, diuresing / on milrinone  LINES/TUBES: R IJ 5/9 >>  DISCUSSION: 66 year old female with extensive cardiac history admitted on 5/9 as Code STEMI. While in Cath-lab after being administered heparin she was noted to have increase in size of hernias, with concern for internal bleeding cath was held and patient was taken back to ED. In ED patient had significant hypotension requiring levophed gtt. Work up found her to have Ecoli bacteremia & abdominal hematoma on CT A/P.   ASSESSMENT / PLAN:  PULMONARY A: Pulmonary Edema - resolved  P:   Intermittent chest x-ray O2 to support sats > 90% Pulmonary hygiene   CARDIOVASCULAR A:  STEMI Hypotension related to STEMI + sepsis (Ecoli in blood) H/O CHF (EF 20-25), CAD S/P ICD, A.Flutter  Mild Pulm Edema P:  Wean levophed / milrinone > defer to Cardiology  Continue diuresis as tolerated  ICU monitoring of hemodynamics  Hold home lopressor, lipitor, aldactone, ASA for now  Appreciate Cardiology recommendations Follow CoOx Stress steroids, 100 mg IV Q8 Amiodarone gtt initiated 5/12 per Cardiology  Remains on Coreg > defer to Cardiology / levophed gtt  RENAL A:   Acute Kidney Injury 2/2 shock ( STEMI + sepsis with Ecoli bacteremia) Lactic Acidosis  P:   Lasix 40 mg BID > defer diuresis to Nephrology  Nephrology following, appreciate input  Trend BMP / urinary output Replace electrolytes as indicated Avoid nephrotoxic agents, ensure adequate renal  perfusion   GASTROINTESTINAL A:   Cirrhosis  Malnutrition, moderate S/P Recent Hernia (Umbilical + Obturator) Repair with Seroma / Hematoma  P:   Diet per SLP   HEMATOLOGIC A:   Leukopenia  Suspected Abdominal Hematoma - increase in size of hernias during cath  Anemia P:  Trend CBC  Transfuse for Hgb <7% Heparin gtt per pharmacy  Monitor for bleeding   INFECTIOUS A:   Septic Shock 2/2 Ecoli bacteremia, likely from abdominal source P:   Continue ceftriaxone, D3 Follow cultures  Stress steroids as above for septic shock    ENDOCRINE A:   Hypoglycemia . resolved  P:   Trend glucose on BMP     NEUROLOGIC A:   Acute Encephalopathy. Improved.  P:   RASS goal: 0 Monitor  Hold sedating medications   FAMILY  - Updates: Patient updated on plan of care 5/12  - Inter-disciplinary family meet or Palliative Care meeting due by: 5/16  CC Time: 30 minutes    Canary Brim, NP-C [facility] Pgr: [phone] or if no answer [phone] 10/14/2016, 9:50 AM

## 2016-10-15 ENCOUNTER — Inpatient Hospital Stay (HOSPITAL_COMMUNITY): Payer: Medicare HMO

## 2016-10-15 LAB — CBC
HCT: 28.7 % — ABNORMAL LOW (ref 36.0–46.0)
HEMOGLOBIN: 10.4 g/dL — AB (ref 12.0–15.0)
MCH: 28 pg (ref 26.0–34.0)
MCHC: 36.2 g/dL — ABNORMAL HIGH (ref 30.0–36.0)
MCV: 77.2 fL — ABNORMAL LOW (ref 78.0–100.0)
PLATELETS: 195 10*3/uL (ref 150–400)
RBC: 3.72 MIL/uL — AB (ref 3.87–5.11)
RDW: 17.1 % — ABNORMAL HIGH (ref 11.5–15.5)
WBC: 34.5 10*3/uL — AB (ref 4.0–10.5)

## 2016-10-15 LAB — HEPARIN LEVEL (UNFRACTIONATED)
HEPARIN UNFRACTIONATED: 0.33 [IU]/mL (ref 0.30–0.70)
Heparin Unfractionated: 0.46 IU/mL (ref 0.30–0.70)

## 2016-10-15 LAB — GLUCOSE, CAPILLARY
GLUCOSE-CAPILLARY: 204 mg/dL — AB (ref 65–99)
Glucose-Capillary: 165 mg/dL — ABNORMAL HIGH (ref 65–99)
Glucose-Capillary: 218 mg/dL — ABNORMAL HIGH (ref 65–99)

## 2016-10-15 LAB — RENAL FUNCTION PANEL
ALBUMIN: 1.9 g/dL — AB (ref 3.5–5.0)
Anion gap: 13 (ref 5–15)
BUN: 85 mg/dL — AB (ref 6–20)
CALCIUM: 7.9 mg/dL — AB (ref 8.9–10.3)
CO2: 22 mmol/L (ref 22–32)
CREATININE: 2.57 mg/dL — AB (ref 0.44–1.00)
Chloride: 89 mmol/L — ABNORMAL LOW (ref 101–111)
GFR calc Af Amer: 21 mL/min — ABNORMAL LOW (ref >60)
GFR, EST NON AFRICAN AMERICAN: 18 mL/min — AB (ref >60)
GLUCOSE: 163 mg/dL — AB (ref 65–99)
PHOSPHORUS: 5.1 mg/dL — AB (ref 2.5–4.6)
POTASSIUM: 4 mmol/L (ref 3.5–5.1)
SODIUM: 124 mmol/L — AB (ref 135–145)

## 2016-10-15 LAB — COOXEMETRY PANEL
CARBOXYHEMOGLOBIN: 1.5 % (ref 0.5–1.5)
Methemoglobin: 1.1 % (ref 0.0–1.5)
O2 SAT: 63.9 %
Total hemoglobin: 10.4 g/dL — ABNORMAL LOW (ref 12.0–16.0)

## 2016-10-15 LAB — MAGNESIUM: MAGNESIUM: 1.8 mg/dL (ref 1.7–2.4)

## 2016-10-15 MED ORDER — FUROSEMIDE 10 MG/ML IJ SOLN
160.0000 mg | Freq: Three times a day (TID) | INTRAVENOUS | Status: DC
  Administered 2016-10-15: 160 mg via INTRAVENOUS
  Filled 2016-10-15 (×2): qty 16

## 2016-10-15 MED ORDER — AMIODARONE HCL IN DEXTROSE 360-4.14 MG/200ML-% IV SOLN
30.0000 mg/h | INTRAVENOUS | Status: DC
  Administered 2016-10-15 – 2016-10-18 (×6): 30 mg/h via INTRAVENOUS
  Filled 2016-10-15 (×6): qty 200

## 2016-10-15 MED ORDER — HYDROCORTISONE NA SUCCINATE PF 100 MG IJ SOLR
50.0000 mg | Freq: Four times a day (QID) | INTRAMUSCULAR | Status: DC
  Administered 2016-10-15 – 2016-10-18 (×11): 50 mg via INTRAVENOUS
  Filled 2016-10-15 (×11): qty 2

## 2016-10-15 MED ORDER — SENNOSIDES-DOCUSATE SODIUM 8.6-50 MG PO TABS
1.0000 | ORAL_TABLET | Freq: Two times a day (BID) | ORAL | Status: DC
  Filled 2016-10-15: qty 1

## 2016-10-15 MED ORDER — AMIODARONE HCL IN DEXTROSE 360-4.14 MG/200ML-% IV SOLN
60.0000 mg/h | INTRAVENOUS | Status: AC
  Administered 2016-10-15 (×2): 60 mg/h via INTRAVENOUS
  Filled 2016-10-15: qty 200

## 2016-10-15 NOTE — Progress Notes (Signed)
S: Says she is better but that is optimistic.  Very weak O:BP (!) 101/57 (BP Location: Right Arm)   Pulse (!) 123   Temp 98 F (36.7 C) (Oral)   Resp (!) 25   Ht 5\' 6"  (1.676 m)   Wt 64.1 kg (141 lb 5 oz)   SpO2 96%   BMI 22.81 kg/m   Intake/Output Summary (Last 24 hours) at 10/15/16 0849 Last data filed at 10/15/16 0800  Gross per 24 hour  Intake          1360.29 ml  Output              425 ml  Net           935.29 ml   Weight change: 2.3 kg (5 lb 1.1 oz) ZOX:WRUEAGen:Awake and alert VWU:JWJXBCVS:Tachy, reg Resp:clear Abd:+BS, NT minimal tenderness Ext: No edema NEURO:CNI Ox3 no asterixis.  Though she is oriented, not sure she is tracking well Rt IJ triple lumen   . carvedilol  3.125 mg Oral BID WC  . polyethylene glycol  17 g Oral BID   Dg Chest Port 1 View  Result Date: 10/15/2016 CLINICAL DATA:  Acute respiratory failure.  Hypoxia. EXAM: PORTABLE CHEST 1 VIEW COMPARISON:  Oct 13, 2016 FINDINGS: No pneumothorax. New opacity and probable associated effusion in the right base. Stable cardiomegaly. Focal opacity in the left retrocardiac region is similar to minimally improved. There may be minimal overlying pulmonary venous hypertension. The right IJ is stable. The AICD device is stable. No other interval change. IMPRESSION: 1. New infiltrate and possible associated effusion in the right base. Stable infiltrate in the left retrocardiac region. Pulmonary venous hypertension. No other change. Electronically Signed   By: Gerome Samavid  Williams III M.D   On: 10/15/2016 07:18   BMET    Component Value Date/Time   NA 124 (L) 10/15/2016 0435   K 4.0 10/15/2016 0435   CL 89 (L) 10/15/2016 0435   CO2 22 10/15/2016 0435   GLUCOSE 163 (H) 10/15/2016 0435   BUN 85 (H) 10/15/2016 0435   CREATININE 2.57 (H) 10/15/2016 0435   CREATININE 0.68 04/21/2016 1524   CALCIUM 7.9 (L) 10/15/2016 0435   GFRNONAA 18 (L) 10/15/2016 0435   GFRAA 21 (L) 10/15/2016 0435   CBC    Component Value Date/Time   WBC 34.5  (H) 10/15/2016 0435   RBC 3.72 (L) 10/15/2016 0435   HGB 10.4 (L) 10/15/2016 0435   HCT 28.7 (L) 10/15/2016 0435   PLT 195 10/15/2016 0435   MCV 77.2 (L) 10/15/2016 0435   MCH 28.0 10/15/2016 0435   MCHC 36.2 (H) 10/15/2016 0435   RDW 17.1 (H) 10/15/2016 0435   LYMPHSABS 0.8 10-Dec-2016 1208   MONOABS 0.4 10-Dec-2016 1208   EOSABS 0.0 10-Dec-2016 1208   BASOSABS 0.0 10-Dec-2016 1208     Assessment:  1. ARF secondary to hypotension in setting of E Coli sepsis 2. E Coli Sepsis on ceftriaxone 3. STEMI 4. SP recent hernia (umbilical and obturator) repairs with seroma/hematoma  Plan: 1.  Cont to try stimulate UO with lasix 2. Please avoid fleets enemas 3. Daily labs 4.  If renal fx cont to deteriorate then will need HD/CVVHD if the plan is for aggressive therapy.  Not sure she is able to comprehend what dialysis is at this time   Virgin Zellers T

## 2016-10-15 NOTE — Progress Notes (Signed)
PT Cancellation Note  Patient Details Name: Sheri Dunn MRN: [id number] DOB: Mar 05, 1951   Cancelled Treatment:    Reason Eval/Treat Not Completed: Medical issues which prohibited therapy   Discussed Sheri Dunn's status with Millie, RN; Currently very hypotensive and has not tolerated minimal bed level activity this am;   Will hold PT for today and check back for appropriateness of PT eval tomorrow;   Dr. Molli KnockYacoub, is it worth considering a Palliative Medicine Team consult at this time?  Sheri Dunn, [place]  Acute Rehabilitation Services Pager [phone] Office [phone]    Sheri Dunn 10/15/2016, 9:36 AM

## 2016-10-15 NOTE — Progress Notes (Signed)
PULMONARY / CRITICAL CARE MEDICINE   Name: Sheri Dunn MRN: [id number] DOB: December 29, 1950    ADMISSION DATE:  10/12/2016 CONSULTATION DATE:  11/01/2016  REFERRING MD:  Dr. 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   Presents 5/9 with mental status changes, weakness, and hypotension. This morning patient reported lightheadedness and loss of vision. Reports that ever since surgery has had problems with hypotension and hypoglycemia. EKG revealed inferior ST elevation with reciprocal changes. Code STEMI activated. Cardiology consulted. Patient taken to Cath-Lab and given 3,000u of unfractionated heparin, it was then noted that the bulging of hernia had increased in size, with concern for internal bleed cath was held and patient taken back to ED. CT A/P revealed extraperitoneal fluid collection tracking deep to the rectus sheath, questioning seroma/hematoma not excluding infection. Surgery consulted. In ED patient had progressive hypotension, was given 1L bolus NS without improvement, requiring levophed gtt. PCCM asked to admit.    SUBJECTIVE:  No acute events overnight.  PT deferred with hypotension / pressors. Worsening AKI (sr cr up to 2.57).  WBC up to 34 (from 15).  Pt states she feels well (but you can see she does not)   VITAL SIGNS: BP (!) 101/57 (BP Location: Right Arm)   Pulse (!) 123   Temp 98 F (36.7 C) (Oral)   Resp (!) 25   Ht 5\' 6"  (1.676 m)   Wt 141 lb 5 oz (64.1 kg)   SpO2 96%   BMI 22.81 kg/m   HEMODYNAMICS: CVP:  [14 mmHg-16 mmHg] 14 mmHg  VENTILATOR SETTINGS:    INTAKE / OUTPUT: I/O last 3 completed shifts: In: 1829.3 [P.O.:240; I.V.:1477.3; IV Piggyback:112] Out: 475 [Urine:475]  PHYSICAL EXAMINATION: General: Frail adult female sitting in  bed eating breakfast (very slowly). Appears fatigued.  HEENT: MM pink/moist, poor dentition Neuro: AAO 4, speech clear CV: s1s2 rrr, no m/r/g PULM: even/non-labored, basilar crackles on right, clear on left WU:JWJX, non-tender, bsx4 active  Extremities: warm/dry, 1+ bilateral lower extremity edema  Skin: no rashes or lesions    LABS:  BMET  Recent Labs Lab 10/13/16 1629 10/14/16 0420 10/15/16 0435  NA 125* 125* 124*  K 3.8 3.7 4.0  CL 92* 91* 89*  CO2 18* 22 22  BUN 57* 64* 85*  CREATININE 1.94* 2.27* 2.57*  GLUCOSE 140* 111* 163*    Electrolytes  Recent Labs Lab 10/13/16 0130 10/13/16 1629 10/14/16 0420 10/15/16 0435  CALCIUM 7.4* 7.6* 8.0* 7.9*  MG 1.9  --  1.8 1.8  PHOS 5.3*  --  5.0* 5.1*    CBC  Recent Labs Lab 10/13/16 0130 10/13/16 1629 10/14/16 0420 10/15/16 0435  WBC 8.0  --  15.0* 34.5*  HGB 7.2* 8.2* 8.7* 10.4*  HCT 21.5* 23.9* 25.4* 28.7*  PLT 222  --  208 195    Coag's  Recent Labs Lab 10/29/2016 1237  APTT 91*  INR 1.80    Sepsis Markers  Recent Labs Lab 10/27/2016 1325 10/30/2016 1417 10/28/2016 1600 10/05/2016 1924 10/12/16 0454 10/13/16 0130  LATICACIDVEN 5.27*  --  3.0* 2.7*  --   --   PROCALCITON  --  37.31  --   --  32.02 27.38    ABG No results for input(s): PHART, PCO2ART, PO2ART in the last  168 hours.  Liver Enzymes  Recent Labs Lab 10/27/2016 1237 10/13/16 0130 10/15/16 0435  AST 31 25  --   ALT 14 14  --   ALKPHOS 62 52  --   BILITOT 2.4* 2.2*  --   ALBUMIN 2.5* 2.2* 1.9*    Cardiac Enzymes  Recent Labs Lab 10/13/16 0442 10/13/16 1024 10/13/16 1629  TROPONINI 0.27* 0.23* 0.20*    Glucose  Recent Labs Lab 10/14/16 0322 10/14/16 0821 10/14/16 1219 10/14/16 1548 10/14/16 1934 10/15/16 0807  GLUCAP 110* 116* 153* 159* 162* 165*    Imaging Dg Chest Port 1 View  Result Date: 10/15/2016 CLINICAL DATA:  Acute respiratory failure.  Hypoxia. EXAM: PORTABLE CHEST 1 VIEW COMPARISON:  Oct 13, 2016 FINDINGS: No pneumothorax. New opacity and probable associated effusion in the right base. Stable cardiomegaly. Focal opacity in the left retrocardiac region is similar to minimally improved. There may be minimal overlying pulmonary venous hypertension. The right IJ is stable. The AICD device is stable. No other interval change. IMPRESSION: 1. New infiltrate and possible associated effusion in the right base. Stable infiltrate in the left retrocardiac region. Pulmonary venous hypertension. No other change. Electronically Signed   By: Gerome Sam III M.D   On: 10/15/2016 07:18     STUDIES:  CT A/P 5/9 >> Moderate Volume Ascites, interval development of an apparent extraperitoneal fluid collection tracking deep to the rectus sheath beginning just cranial to the umbilicus and tracking down towards the symphysis pubis. Seroma/Hematoma consideration, infection not excluded, fluid collection tracking along the anterolateral right lower abdominal wall appears to be extraperitoneal although, bilateral fluid collections identified in the groin regions, moderate ascites, cardiomegaly  CXR 5/9 >>  bibasilar atelectasis, chronic marked cardiomegaly   CULTURES: Blood 5/9 >> Ecoli >> pan sensitive  ANTIBIOTICS: Vancomycin 5/9 >> 5/10 Zosyn 5/9 >> 5/10 Clindamycin 5/9 >> 5/10 Rocephin 5/10 >>   SIGNIFICANT EVENTS: 5/09  Admitted as Code STEMI  5/11  Remains on levo gtt, atrial flutter overnight, cardiology consulted, diuresing / on milrinone 5/13  Remains on pressors, hypotensive, new RLL infiltrate (suspected aspiration), worsening AKI  LINES/TUBES: R IJ 5/9 >>  DISCUSSION: 66 year old female with extensive cardiac history admitted on 5/9 as Code STEMI. While in Cath-lab after being administered heparin she was noted to have increase in size of hernias, with concern for internal bleeding cath was held and patient was taken back to ED. In ED patient had significant hypotension requiring levophed  gtt. Work up found her to have Ecoli bacteremia & abdominal hematoma on CT A/P.   ASSESSMENT / PLAN:  PULMONARY A: Pulmonary Edema - resolved  RLL airspace disease - new infiltrate vs atelectasis, suspect aspiration is infiltrate given weakness P:   Intermittent chest x-ray Monitor right lower lobe airspace disease closely Aspiration precautions O2 to support saturations >90% Pulmonary hygiene  CARDIOVASCULAR A:  STEMI Hypotension related to STEMI + sepsis (Ecoli in blood) H/O CHF (EF 20-25), CAD S/P ICD, A.Flutter  Mild Pulm Edema P:  Defer levophed / milrinone to Cardiology Appreciate cardiology assistance  Hold home Lopressor, Lipitor, Aldactone, ASA ICU monitoring of hemodynamics Follow CoOx Stress steroids 100 mg IV every 8 Amiodarone drip Coreg not being given per RN > soft pressures on levo LCB   RENAL A:   Acute Kidney Injury 2/2 shock ( STEMI + sepsis with Ecoli bacteremia) Lactic Acidosis  P:   Nephrology following, appreciate assistance  Hold lasix for 5/13 given rise in sr cr  and little UOP > reassess in am 5/14  Trend BMP / urinary output Replace electrolytes as indicated Avoid nephrotoxic agents, ensure adequate renal perfusion Concern that she would not be a dialysis candidate as an outpatient   GASTROINTESTINAL A:   Cirrhosis  Malnutrition, moderate S/P Recent Hernia (Umbilical + Obturator) Repair with Seroma / Hematoma  P:  Diet per SLP  Aspiration precautions   HEMATOLOGIC A:   Leukopenia  Suspected Abdominal Hematoma - increase in size of hernias during cath  Anemia P:  Trend CBC Transfuse per ICU guidelines Heparin gtt per pharmacy Monitor for bleeding    INFECTIOUS A:   Septic Shock 2/2 Ecoli bacteremia, likely from abdominal source P:   Continue ceftriaxone, D4/x Follow cultures as above Stress steroids for septic shock   ENDOCRINE A:   Hypoglycemia - resolved  P:   Trend glucose on BMP      NEUROLOGIC A:    Acute Encephalopathy - resolved   P:   Monitor  / supportive care  Minimize sedating medications as able    FAMILY  - Updates: Patient and family updated on plan of care 5/13 am on NP rounding.  We discussed CODE STATUS with family given decline in last 24 hours. They are all on "the same page" but the daughter-in-law wants to confirm with her husband. Agree with DO NOT RESUSCITATE/DO NOT INTUBATE and continuing aggressive medical care. If declines despite aggressive care may need to transition toward comfort.   - Inter-disciplinary family meet or Palliative Care meeting due by: 5/16    CC Time: 30 minutes    Canary BrimBrandi Dennie Moltz, NP-C [facility] Pgr: [phone] or if no answer [phone] 10/15/2016, 9:41 AM

## 2016-10-15 NOTE — Progress Notes (Signed)
Progress Note  Patient Name: Sheri Dunn Date of Encounter: 10/15/2016  Primary Cardiologist: Dr. Royann Shivers  Subjective   The patient says that she has some mild abdominal pain but overall is breathing OK.    Inpatient Medications    Scheduled Meds: . carvedilol  3.125 mg Oral BID WC  . polyethylene glycol  17 g Oral BID   Continuous Infusions: . sodium chloride    . sodium chloride    . amiodarone 30 mg/hr (10/15/16 0820)  . cefTRIAXone (ROCEPHIN)  IV Stopped (10/14/16 1318)  . furosemide    . heparin 1,250 Units/hr (10/15/16 0800)  . milrinone 0.25 mcg/kg/min (10/15/16 0821)  . norepinephrine (LEVOPHED) Adult infusion 19 mcg/min (10/15/16 0800)   PRN Meds: sodium chloride, acetaminophen, acetaminophen, alum & mag hydroxide-simeth, bisacodyl, lip balm, magic mouthwash, menthol-cetylpyridinium, metoprolol, oxyCODONE, phenol, polyethylene glycol   Vital Signs    Vitals:   10/15/16 0500 10/15/16 0600 10/15/16 0700 10/15/16 0800  BP: (!) 79/54 (!) 98/55 (!) 99/50 (!) 101/57  Pulse: (!) 122 (!) 122 (!) 123 (!) 123  Resp: (!) 30 (!) 28 (!) 25 (!) 25  Temp:      TempSrc:      SpO2: 94% 95% 95% 96%  Weight:      Height:        Intake/Output Summary (Last 24 hours) at 10/15/16 0935 Last data filed at 10/15/16 0800  Gross per 24 hour  Intake          1333.79 ml  Output              425 ml  Net           908.79 ml   Filed Weights   10/13/16 0600 10/14/16 0418 10/15/16 0440  Weight: 130 lb (59 kg) 136 lb 3.9 oz (61.8 kg) 141 lb 5 oz (64.1 kg)    Telemetry    Atrial flutter with 2:1 conduction and ectopy.  - Personally Reviewed  ECG    NA - Personally Reviewed  Physical Exam   GEN:    She is alert and responsive but she is weak and critically ill Neck: Positive JVD Cardiac:  RRR (flutter), no murmurs, rubs, or gallops.  Respiratory: Decreased  to auscultation bilaterally. GI:   Nontender, distended, decreased breath sounds MS:  Mild edema; No  deformity. Neuro:   Nonfocal  Psych: Oriented and appropriate    Labs    Chemistry Recent Labs Lab 10/03/2016 1237  10/13/16 0130 10/13/16 1629 10/14/16 0420 10/15/16 0435  NA 132*  < > 125* 125* 125* 124*  K 4.6  < > 3.5 3.8 3.7 4.0  CL 98*  < > 90* 92* 91* 89*  CO2 17*  < > 21* 18* 22 22  GLUCOSE 43*  < > 89 140* 111* 163*  BUN 53*  < > 57* 57* 64* 85*  CREATININE 2.63*  < > 2.17* 1.94* 2.27* 2.57*  CALCIUM 8.0*  < > 7.4* 7.6* 8.0* 7.9*  PROT 5.8*  --  5.5*  --   --   --   ALBUMIN 2.5*  --  2.2*  --   --  1.9*  AST 31  --  25  --   --   --   ALT 14  --  14  --   --   --   ALKPHOS 62  --  52  --   --   --   BILITOT 2.4*  --  2.2*  --   --   --  GFRNONAA 18*  < > 22* 26* 21* 18*  GFRAA 21*  < > 26* 30* 25* 21*  ANIONGAP 17*  < > 14 15 12 13   < > = values in this interval not displayed.   Hematology  Recent Labs Lab 10/13/16 0130 10/13/16 1629 10/14/16 0420 10/15/16 0435  WBC 8.0  --  15.0* 34.5*  RBC 2.73*  --  3.23* 3.72*  HGB 7.2* 8.2* 8.7* 10.4*  HCT 21.5* 23.9* 25.4* 28.7*  MCV 78.8  --  78.6 77.2*  MCH 26.4  --  26.9 28.0  MCHC 33.5  --  34.3 36.2*  RDW 17.0*  --  16.7* 17.1*  PLT 222  --  208 195    Cardiac Enzymes  Recent Labs Lab 10/12/16 0304 10/13/16 0442 10/13/16 1024 10/13/16 1629  TROPONINI 0.39* 0.27* 0.23* 0.20*     Recent Labs Lab 10/31/2016 1204  TROPIPOC 0.88*     BNPNo results for input(s): BNP, PROBNP in the last 168 hours.   DDimer No results for input(s): DDIMER in the last 168 hours.   Radiology    Dg Chest Port 1 View  Result Date: 10/15/2016 CLINICAL DATA:  Acute respiratory failure.  Hypoxia. EXAM: PORTABLE CHEST 1 VIEW COMPARISON:  Oct 13, 2016 FINDINGS: No pneumothorax. New opacity and probable associated effusion in the right base. Stable cardiomegaly. Focal opacity in the left retrocardiac region is similar to minimally improved. There may be minimal overlying pulmonary venous hypertension. The right IJ is  stable. The AICD device is stable. No other interval change. IMPRESSION: 1. New infiltrate and possible associated effusion in the right base. Stable infiltrate in the left retrocardiac region. Pulmonary venous hypertension. No other change. Electronically Signed   By: Gerome Samavid  Williams III M.D   On: 10/15/2016 07:18    Cardiac Studies   Echo:  Study Conclusions  - Left ventricle: The cavity size was normal. Systolic function was moderately to severely reduced. The estimated ejection fraction was in the range of 30% to 35%. Wall motion was normal; there were no regional wall motion abnormalities. Doppler parameters are consistent with abnormal left ventricular relaxation (grade 1 diastolic dysfunction). - Ventricular septum: Septal motion showed abnormal function and dyssynergy. - Aortic valve: There was moderate regurgitation. - Mitral valve: There was mild regurgitation. - Left atrium: The atrium was mildly dilated. - Right ventricle: The cavity size was mildly dilated. Wall thickness was normal. - Right atrium: The atrium was severely dilated. - Tricuspid valve: There was mild regurgitation. - Pulmonary arteries: Systolic pressure was mildly increased. PA peak pressure: 42 mm Hg (S).  Impressions:  - EF is mildly improved when compared to prior. (20%)  Patient Profile     66 y.o. female with a history of severe ischemic cardiomyopathy, chronic systolic and diastolic biventricular HF, ascites with seroma, CAD s/p NSTEMI and DES to RCA and LCX 09/2014, s/p ICD November 2016 (Medtronic Visia AF), PAFlutter, AI, HLD, who [facility] for [facility] her[facility]. Cardiology consulted for perioperative hypotension and transient ECG changes. She was found to be volume overloaded and required IV diuresis.   Assessment & Plan    ATRIAL FLUTTER:  She has had paroxysmal atrial flutter.  For past couple of days this has been persistent with 2:1 conduction.  I  started amiodarone yesterday.   Rate is slower than it was yesterda. I will give an increased dose of amiodarone today.  However, I do not think that her atrial flutter is  the main contributor to her hemodynamics.  She has had blood loss, sepsis and known cardiomyopathy.  Overall she is not making sig  ARF:  Nephrology following.  See below.    STEMI:  Medical management given her significant comorbid issues.  No change in therapy.  Of note EKG yesterday did not demonstrate any acute ST elevation.  She is not complaining of chest pain.   ACUTE ON CHRONIC SYSTOLIC HF:  (EF 25%).   Remains hypotensive on milrinone and Levophed drip.  CVP 11.  CoOx is stable at 63.9.    Continue current IV therapy.  Shock is multifactorial.    CXR early this AM with new infiltrate on the right base.  She is on broad spectrum antibiotics and has increased Lasix.   No fevers.    ACUTE ON CHRONIC RENAL INSUFFICIENCY:  Creat is increased again today and UO remains low..  Nephrology has increased her Lasix this AM.    HYPOTENSION:   Shock multifactorial.  She has no acute ST elevation or evidence of ongoing acute coronary event.  She does have a severe ischemic cardiomyopathy with echo unchanged.  She is being treated for sepsis and has increased WBC but was on steroids.  Hgb is stable.  Continuing supportive care but prognosis looks poor. Her granddaughter is in the room and is waiting for an update from CCM.   She did have Enterobacter species noted on blood culture.    Signed, Rollene Rotunda, MD  10/15/2016, 9:35 AM

## 2016-10-15 NOTE — Progress Notes (Signed)
ANTICOAGULATION CONSULT NOTE - Follow Up Consult  Pharmacy Consult for Heparin  Indication: atrial fibrillation  Allergies  Allergen Reactions  . No Known Allergies     Patient Measurements: Height: 5\' 6"  (167.6 cm) Weight: 141 lb 5 oz (64.1 kg) IBW/kg (Calculated) : 59.3  Vital Signs: Temp: 98 F (36.7 C) (05/13 0000) Temp Source: Oral (05/13 0000) BP: 98/59 (05/13 0400) Pulse Rate: 122 (05/13 0400)  Labs:  Recent Labs  10/13/16 0130 10/13/16 0442 10/13/16 1024 10/13/16 1629  10/14/16 0420 10/14/16 0500 10/14/16 1552 10/15/16 0435  HGB 7.2*  --   --  8.2*  --  8.7*  --   --  10.4*  HCT 21.5*  --   --  23.9*  --  25.4*  --   --  28.7*  PLT 222  --   --   --   --  208  --   --  195  HEPARINUNFRC  --   --   --   --   < >  --  0.17* 0.22* 0.33  CREATININE 2.17*  --   --  1.94*  --  2.27*  --   --  2.57*  TROPONINI  --  0.27* 0.23* 0.20*  --   --   --   --   --   < > = values in this interval not displayed.  Estimated Creatinine Clearance: 19.9 mL/min (A) (by C-G formula based on SCr of 2.57 mg/dL (H)).    Assessment: 66 y/o F on heparin for afib, heparin level therapeutic after rate increase  Goal of Therapy:  Heparin level 0.3-0.7 units/ml Monitor platelets by anticoagulation protocol: Yes   Plan:  -Cont heparin 1250 units/hr -1200 HL  Takela Varden 10/15/2016,5:49 AM

## 2016-10-15 NOTE — Progress Notes (Signed)
ANTICOAGULATION CONSULT NOTE - Follow Up Consult  Pharmacy Consult for Heparin  Indication: atrial fibrillation  Allergies  Allergen Reactions  . No Known Allergies     Patient Measurements: Height: 5\' 6"  (167.6 cm) Weight: 141 lb 5 oz (64.1 kg) IBW/kg (Calculated) : 59.3  Vital Signs: Temp: 97.6 F (36.4 C) (05/13 1200) Temp Source: Oral (05/13 1200) BP: 82/53 (05/13 1230) Pulse Rate: 119 (05/13 1230)  Labs:  Recent Labs  10/13/16 0130 10/13/16 0442 10/13/16 1024 10/13/16 1629  10/14/16 0420  10/14/16 1552 10/15/16 0435 10/15/16 1358  HGB 7.2*  --   --  8.2*  --  8.7*  --   --  10.4*  --   HCT 21.5*  --   --  23.9*  --  25.4*  --   --  28.7*  --   PLT 222  --   --   --   --  208  --   --  195  --   HEPARINUNFRC  --   --   --   --   < >  --   < > 0.22* 0.33 0.46  CREATININE 2.17*  --   --  1.94*  --  2.27*  --   --  2.57*  --   TROPONINI  --  0.27* 0.23* 0.20*  --   --   --   --   --   --   < > = values in this interval not displayed.  Estimated Creatinine Clearance: 19.9 mL/min (A) (by C-G formula based on SCr of 2.57 mg/dL (H)).    Assessment: 66 y/o F on heparin for afib, heparin level therapeutic after rate increase overnight. Confirmatory level at goal. No issues noted.  Goal of Therapy:  Heparin level 0.3-0.7 units/ml Monitor platelets by anticoagulation protocol: Yes   Plan:  -Cont heparin 1250 units/hr  Sheppard CoilFrank Cydnee Fuquay PharmD., BCPS Clinical Pharmacist Pager [phone] 10/15/2016 2:41 PM

## 2016-10-15 NOTE — Plan of Care (Signed)
  Interdisciplinary Goals of Care Family Meeting   Date carried out:: 10/15/2016  Location of the meeting: Bedside  Member's involved: Physician, Nurse Practitioner and Family Member or next of kin.  Multiple siblings present for meeting, nieces & nephews.    Durable Power of Insurance risk surveyorAttorney or acting medical decision maker: Pt's brother, sisters     Discussion: We discussed goals of care for [employer] Energy CorporationCarolyn Griffee .  Reviewed patients clinical history and current status with the patient & family.  Discussed bacteremia secondary to [facility]Coli, STEMI, Acute on Chronic systolic CHF, AKI on Chronic renal insufficiency, hypotension and atrial flutter.  Family informed of her clinical decline in the last 24 hours, new infiltrate in RLL and concern for rising pressor needs.  They indicate understanding.  Hopeful to turn her around but she has not responded to therapy thus far.  Family encouraged to visit with patient.    Code status: Cental IV access and IV vasoactive drugs.  DNR/DNI, only pressors if needed.   Disposition: Continue current acute care  Time spent for the meeting: 20 minutes    Discussion led by Dr. Jamison NeighborNestor.    Canary BrimBrandi Miesha Bachmann, NP-C [facility] Pgr: [phone] or if no answer [phone] 10/15/2016, 2:05 PM

## 2016-10-16 ENCOUNTER — Inpatient Hospital Stay (HOSPITAL_COMMUNITY): Payer: Medicare HMO

## 2016-10-16 DIAGNOSIS — I5043 Acute on chronic combined systolic (congestive) and diastolic (congestive) heart failure: Secondary | ICD-10-CM

## 2016-10-16 LAB — RENAL FUNCTION PANEL
ALBUMIN: 1.9 g/dL — AB (ref 3.5–5.0)
ALBUMIN: 1.7 g/dL — AB (ref 3.5–5.0)
ANION GAP: 15 (ref 5–15)
ANION GAP: 15 (ref 5–15)
BUN: 115 mg/dL — ABNORMAL HIGH (ref 6–20)
BUN: 123 mg/dL — ABNORMAL HIGH (ref 6–20)
CALCIUM: 8.1 mg/dL — AB (ref 8.9–10.3)
CALCIUM: 7.7 mg/dL — AB (ref 8.9–10.3)
CO2: 20 mmol/L — AB (ref 22–32)
CO2: 15 mmol/L — AB (ref 22–32)
Chloride: 87 mmol/L — ABNORMAL LOW (ref 101–111)
Chloride: 89 mmol/L — ABNORMAL LOW (ref 101–111)
Creatinine, Ser: 3.03 mg/dL — ABNORMAL HIGH (ref 0.44–1.00)
Creatinine, Ser: 3.14 mg/dL — ABNORMAL HIGH (ref 0.44–1.00)
GFR calc Af Amer: 17 mL/min — ABNORMAL LOW (ref >60)
GFR calc non Af Amer: 15 mL/min — ABNORMAL LOW (ref >60)
GFR calc non Af Amer: 14 mL/min — ABNORMAL LOW (ref >60)
GFR, EST AFRICAN AMERICAN: 17 mL/min — AB (ref >60)
GLUCOSE: 281 mg/dL — AB (ref 65–99)
Glucose, Bld: 211 mg/dL — ABNORMAL HIGH (ref 65–99)
PHOSPHORUS: 5.4 mg/dL — AB (ref 2.5–4.6)
POTASSIUM: 4 mmol/L (ref 3.5–5.1)
Phosphorus: 5.3 mg/dL — ABNORMAL HIGH (ref 2.5–4.6)
Potassium: 4.2 mmol/L (ref 3.5–5.1)
SODIUM: 122 mmol/L — AB (ref 135–145)
SODIUM: 119 mmol/L — AB (ref 135–145)

## 2016-10-16 LAB — GLUCOSE, CAPILLARY
GLUCOSE-CAPILLARY: 191 mg/dL — AB (ref 65–99)
GLUCOSE-CAPILLARY: 205 mg/dL — AB (ref 65–99)
Glucose-Capillary: 181 mg/dL — ABNORMAL HIGH (ref 65–99)
Glucose-Capillary: 183 mg/dL — ABNORMAL HIGH (ref 65–99)
Glucose-Capillary: 185 mg/dL — ABNORMAL HIGH (ref 65–99)
Glucose-Capillary: 194 mg/dL — ABNORMAL HIGH (ref 65–99)
Glucose-Capillary: 216 mg/dL — ABNORMAL HIGH (ref 65–99)
Glucose-Capillary: 217 mg/dL — ABNORMAL HIGH (ref 65–99)
Glucose-Capillary: 277 mg/dL — ABNORMAL HIGH (ref 65–99)

## 2016-10-16 LAB — HEPARIN LEVEL (UNFRACTIONATED)
Heparin Unfractionated: 0.63 IU/mL (ref 0.30–0.70)
Heparin Unfractionated: 0.8 IU/mL — ABNORMAL HIGH (ref 0.30–0.70)

## 2016-10-16 LAB — COOXEMETRY PANEL
Carboxyhemoglobin: 1.3 % (ref 0.5–1.5)
Methemoglobin: 1 % (ref 0.0–1.5)
O2 Saturation: 63.4 %
TOTAL HEMOGLOBIN: 10.6 g/dL — AB (ref 12.0–16.0)

## 2016-10-16 LAB — CBC
HEMATOCRIT: 28.5 % — AB (ref 36.0–46.0)
Hemoglobin: 10.5 g/dL — ABNORMAL LOW (ref 12.0–15.0)
MCH: 27.7 pg (ref 26.0–34.0)
MCHC: 36.8 g/dL — ABNORMAL HIGH (ref 30.0–36.0)
MCV: 75.2 fL — AB (ref 78.0–100.0)
Platelets: 171 10*3/uL (ref 150–400)
RBC: 3.79 MIL/uL — ABNORMAL LOW (ref 3.87–5.11)
RDW: 17.3 % — AB (ref 11.5–15.5)
WBC: 55 10*3/uL — AB (ref 4.0–10.5)

## 2016-10-16 LAB — TROPONIN I: TROPONIN I: 0.58 ng/mL — AB (ref <0.03)

## 2016-10-16 LAB — CULTURE, BLOOD (ROUTINE X 2)
Culture: NO GROWTH
Special Requests: ADEQUATE

## 2016-10-16 LAB — LACTIC ACID, PLASMA
LACTIC ACID, VENOUS: 2 mmol/L — AB (ref 0.5–1.9)
LACTIC ACID, VENOUS: 1.9 mmol/L (ref 0.5–1.9)

## 2016-10-16 LAB — MAGNESIUM: Magnesium: 1.9 mg/dL (ref 1.7–2.4)

## 2016-10-16 MED ORDER — PIPERACILLIN-TAZOBACTAM IN DEX 2-0.25 GM/50ML IV SOLN
2.2500 g | Freq: Three times a day (TID) | INTRAVENOUS | Status: DC
  Administered 2016-10-16: 2.25 g via INTRAVENOUS
  Filled 2016-10-16 (×2): qty 50

## 2016-10-16 MED ORDER — VASOPRESSIN 20 UNIT/ML IV SOLN
0.0300 [IU]/min | INTRAVENOUS | Status: DC
  Filled 2016-10-16: qty 2

## 2016-10-16 MED ORDER — INSULIN ASPART 100 UNIT/ML ~~LOC~~ SOLN
0.0000 [IU] | SUBCUTANEOUS | Status: DC
  Administered 2016-10-16 (×2): 2 [IU] via SUBCUTANEOUS
  Administered 2016-10-16 (×2): 3 [IU] via SUBCUTANEOUS
  Administered 2016-10-16 (×2): 2 [IU] via SUBCUTANEOUS
  Administered 2016-10-17 (×2): 1 [IU] via SUBCUTANEOUS
  Administered 2016-10-17 – 2016-10-18 (×5): 2 [IU] via SUBCUTANEOUS

## 2016-10-16 MED ORDER — PRISMASOL BGK 4/2.5 32-4-2.5 MEQ/L IV SOLN
INTRAVENOUS | Status: DC
  Administered 2016-10-16 – 2016-10-18 (×3): via INTRAVENOUS_CENTRAL
  Filled 2016-10-16 (×6): qty 5000

## 2016-10-16 MED ORDER — SODIUM CHLORIDE 0.9 % IV SOLN
Freq: Once | INTRAVENOUS | Status: AC
  Administered 2016-10-16: 15:00:00 via INTRAVENOUS

## 2016-10-16 MED ORDER — DEXTROSE 50 % IV SOLN
INTRAVENOUS | Status: AC
  Administered 2016-10-16: 50 mL
  Filled 2016-10-16: qty 50

## 2016-10-16 MED ORDER — VANCOMYCIN HCL IN DEXTROSE 1-5 GM/200ML-% IV SOLN: 1000.0000 mg | INTRAVENOUS | Status: DC

## 2016-10-16 MED ORDER — HEPARIN SODIUM (PORCINE) 1000 UNIT/ML DIALYSIS
1000.0000 [IU] | INTRAMUSCULAR | Status: DC | PRN
  Administered 2016-10-16: 3000 [IU] via INTRAVENOUS_CENTRAL
  Administered 2016-10-18: 4000 [IU] via INTRAVENOUS_CENTRAL
  Filled 2016-10-16 (×3): qty 6
  Filled 2016-10-16: qty 3
  Filled 2016-10-16: qty 4

## 2016-10-16 MED ORDER — PRISMASOL BGK 4/2.5 32-4-2.5 MEQ/L IV SOLN
INTRAVENOUS | Status: DC
  Administered 2016-10-16 – 2016-10-18 (×12): via INTRAVENOUS_CENTRAL
  Filled 2016-10-16 (×22): qty 5000

## 2016-10-16 MED ORDER — SODIUM CHLORIDE 0.9% FLUSH
10.0000 mL | Freq: Two times a day (BID) | INTRAVENOUS | Status: DC
  Administered 2016-10-17 – 2016-10-18 (×2): 10 mL

## 2016-10-16 MED ORDER — PANTOPRAZOLE SODIUM 40 MG IV SOLR
40.0000 mg | INTRAVENOUS | Status: DC
  Administered 2016-10-16 – 2016-10-18 (×3): 40 mg via INTRAVENOUS
  Filled 2016-10-16 (×3): qty 40

## 2016-10-16 MED ORDER — SODIUM CHLORIDE 0.9 % IV SOLN
0.0000 ug/min | INTRAVENOUS | Status: DC
  Administered 2016-10-16: 70 ug/min via INTRAVENOUS
  Administered 2016-10-16 (×2): 20 ug/min via INTRAVENOUS
  Filled 2016-10-16 (×4): qty 1

## 2016-10-16 MED ORDER — CHLORHEXIDINE GLUCONATE CLOTH 2 % EX PADS
6.0000 | MEDICATED_PAD | Freq: Every day | CUTANEOUS | Status: DC
  Administered 2016-10-17 – 2016-10-18 (×2): 6 via TOPICAL

## 2016-10-16 MED ORDER — ORAL CARE MOUTH RINSE
15.0000 mL | Freq: Two times a day (BID) | OROMUCOSAL | Status: DC
  Administered 2016-10-17 – 2016-10-18 (×2): 15 mL via OROMUCOSAL

## 2016-10-16 MED ORDER — SODIUM CHLORIDE 0.9% FLUSH: 10.0000 mL | INTRAVENOUS | Status: DC | PRN

## 2016-10-16 MED ORDER — SODIUM CHLORIDE 0.9 % IV SOLN
1250.0000 mg | Freq: Once | INTRAVENOUS | Status: AC
  Administered 2016-10-16: 1250 mg via INTRAVENOUS
  Filled 2016-10-16: qty 1250

## 2016-10-16 MED ORDER — CHLORHEXIDINE GLUCONATE CLOTH 2 % EX PADS
6.0000 | MEDICATED_PAD | Freq: Every day | CUTANEOUS | Status: DC
  Administered 2016-10-16: 6 via TOPICAL

## 2016-10-16 MED ORDER — PRISMASOL BGK 4/2.5 32-4-2.5 MEQ/L IV SOLN
INTRAVENOUS | Status: DC
  Administered 2016-10-16 – 2016-10-17 (×3): via INTRAVENOUS_CENTRAL
  Filled 2016-10-16 (×6): qty 5000

## 2016-10-16 MED ORDER — PIPERACILLIN-TAZOBACTAM 3.375 G IVPB
3.3750 g | Freq: Four times a day (QID) | INTRAVENOUS | Status: DC
  Administered 2016-10-16 – 2016-10-18 (×8): 3.375 g via INTRAVENOUS
  Filled 2016-10-16 (×12): qty 50

## 2016-10-16 MED ORDER — CHLORHEXIDINE GLUCONATE 0.12 % MT SOLN
15.0000 mL | Freq: Two times a day (BID) | OROMUCOSAL | Status: DC
  Administered 2016-10-16 – 2016-10-17 (×3): 15 mL via OROMUCOSAL

## 2016-10-16 MED ORDER — SODIUM CHLORIDE 0.9 % IV SOLN
0.0000 ug/min | INTRAVENOUS | Status: DC
  Administered 2016-10-16: 120 ug/min via INTRAVENOUS
  Administered 2016-10-17: 260 ug/min via INTRAVENOUS
  Administered 2016-10-17: 240 ug/min via INTRAVENOUS
  Administered 2016-10-17: 170 ug/min via INTRAVENOUS
  Administered 2016-10-17: 200 ug/min via INTRAVENOUS
  Administered 2016-10-17: 150 ug/min via INTRAVENOUS
  Administered 2016-10-17: 240 ug/min via INTRAVENOUS
  Administered 2016-10-18: 100 ug/min via INTRAVENOUS
  Administered 2016-10-18: 90 ug/min via INTRAVENOUS
  Filled 2016-10-16 (×12): qty 4

## 2016-10-16 MED ORDER — SODIUM CHLORIDE 0.9 % FOR CRRT
INTRAVENOUS_CENTRAL | Status: DC | PRN
  Filled 2016-10-16: qty 1000

## 2016-10-16 MED ORDER — VANCOMYCIN HCL 500 MG IV SOLR
500.0000 mg | INTRAVENOUS | Status: DC
  Administered 2016-10-17 – 2016-10-18 (×2): 500 mg via INTRAVENOUS
  Filled 2016-10-16 (×2): qty 500

## 2016-10-16 NOTE — Progress Notes (Addendum)
Pharmacy Antibiotic Note  Sheri CurlsCarolyn Dunn is a 66 y.o. female admitted on 10/09/2016 with sepsis found to have E. Coli bacteremia.  She has been on CTX, but WBC have greatly increased 15>34.5>55. Patient is on steroids, which could contribute to the WBC, but this is a very rapid and large increase. Patient is afebrile. PCT 37.31 > 32.02 > 27.38, but this may not be all that reliable given AKI. -SCr 2.2>2.5>3.0 (baseline 0.6). Critical care team would like to broaden to vancomycin and zosyn. Patient is also to start CRRT today.  Plan: -Stop CTX -Vanc 1250mg  x1, then 500mg  (10mg /kg) q24h  -Zosyn 3.375g Q6H -F/u cultures, clinical progress, renal function, VT at steady state -F/u any stoppages in CRRT and readjust meds as necessary  Height: 5\' 6"  (167.6 cm) Weight: 137 lb 12.6 oz (62.5 kg) IBW/kg (Calculated) : 59.3  Temp (24hrs), Avg:97.4 F (36.3 C), Min:96.3 F (35.7 C), Max:97.8 F (36.6 C)   Recent Labs Lab 07/02/2016 1325 07/02/2016 1600 07/02/2016 1924  10/12/16 1254  10/13/16 0130 10/13/16 1629 10/14/16 0420 10/15/16 0435 10/16/16 0452  WBC  --   --   --   < > 3.0*  --  8.0  --  15.0* 34.5* 55.0*  CREATININE  --   --   --   < >  --   < > 2.17* 1.94* 2.27* 2.57* 3.03*  LATICACIDVEN 5.27* 3.0* 2.7*  --   --   --   --   --   --   --   --   < > = values in this interval not displayed.  Estimated Creatinine Clearance: 16.9 mL/min (A) (by C-G formula based on SCr of 3.03 mg/dL (H)).    Allergies  Allergen Reactions  . No Known Allergies     Antimicrobials this admission: -Vanc 5/9>5/10, 5/14> -Zosyn 5/9>5/10, 5/14> -Clinda 5/9>5/10 -CTX 5/10>5/14  Dose adjustments this admission: none  Microbiology results: -5/9 BCx: 1/2 E. Coli pan sensitive -5/10 UCx: ng -5/13 BCx: sent  Gwyndolyn KaufmanKai Sheyna Pettibone Bernette Redbird(Kenny), PharmD  PGY1 Pharmacy Resident Pager: [phone] 10/16/2016 10:27 AM

## 2016-10-16 NOTE — Progress Notes (Signed)
PT Cancellation Note  Patient Details Name: Sheri Dunn MRN: [id number] DOB: [birth year]   Cancelled Treatment:    Reason Eval/Treat Not Completed: Medical issues which prohibited therapy; patient undergoing prep for CRRT and fatigued at this moment.  Continues on multiple pressors.  Will check back tomorrow.   [name] 10/16/2016, 3:06 PM [name], PT [phone] 10/16/2016

## 2016-10-16 NOTE — Progress Notes (Signed)
PULMONARY / CRITICAL CARE MEDICINE   Name: Sheri Dunn MRN: [id number] DOB: 1950-09-07    ADMISSION DATE:  10-22-2016 CONSULTATION DATE:  10/22/2016  REFERRING MD:  Dr. 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   Presents 5/9 with mental status changes, weakness, and hypotension. This morning patient reported lightheadedness and loss of vision. Reports that ever since surgery has had problems with hypotension and hypoglycemia. EKG revealed inferior ST elevation with reciprocal changes. Code STEMI activated. Cardiology consulted. Patient taken to Cath-Lab and given 3,000u of unfractionated heparin, it was then noted that the bulging of hernia had increased in size, with concern for internal bleed cath was held and patient taken back to ED. CT A/P revealed extraperitoneal fluid collection tracking deep to the rectus sheath, questioning seroma/hematoma not excluding infection. Surgery consulted. In ED patient had progressive hypotension, was given 1L bolus NS without improvement, requiring levophed gtt. PCCM asked to admit.    SUBJECTIVE:  Worsening hypotension now on levophed and Neo at 20. Worsening renal function sCr 3.03, decreased UOP Increased WBC now 55, afebrile Increasing oxgyen requirements- now on 6L O2 ~ 90%  VITAL SIGNS: BP 110/75 (BP Location: Right Arm)   Pulse 78   Temp 97.6 F (36.4 C) (Oral)   Resp (!) 31   Ht 5\' 6"  (1.676 m)   Wt 137 lb 12.6 oz (62.5 kg)   SpO2 90%   BMI 22.24 kg/m   HEMODYNAMICS: CVP:  [2 mmHg-3 mmHg] 3 mmHg  VENTILATOR SETTINGS:    INTAKE / OUTPUT: I/O last 3 completed shifts: In: 2640.4 [P.O.:120; I.V.:2470.4; IV Piggyback:50] Out: 215 [Urine:215]  PHYSICAL EXAMINATION: General:  Frail adult female lying in  bed HEENT: MM pink/dry, oral thrush, poor dentition Neuro: Awake, oriented to person and place, follows commands  CV: s1s2 rrr,115, no m/r/g  PULM: even/non-labored, mildly tachypneic, lungs bilaterally clear, diminished in bases GI: mod distention, no tenderness, areas of firmness, incisions c/d/i, bsx4 active  Extremities: cool/dry, +1 BLE edema  Skin: no rashes or lesions  LABS:  BMET  Recent Labs Lab 10/14/16 0420 10/15/16 0435 10/16/16 0452  NA 125* 124* 122*  K 3.7 4.0 4.2  CL 91* 89* 87*  CO2 22 22 20*  BUN 64* 85* 115*  CREATININE 2.27* 2.57* 3.03*  GLUCOSE 111* 163* 211*    Electrolytes  Recent Labs Lab 10/14/16 0420 10/15/16 0435 10/16/16 0452  CALCIUM 8.0* 7.9* 8.1*  MG 1.8 1.8 1.9  PHOS 5.0* 5.1* 5.3*    CBC  Recent Labs Lab 10/14/16 0420 10/15/16 0435 10/16/16 0452  WBC 15.0* 34.5* 55.0*  HGB 8.7* 10.4* 10.5*  HCT 25.4* 28.7* 28.5*  PLT 208 195 171    Coag's  Recent Labs Lab 2016/10/22 1237  APTT 91*  INR 1.80    Sepsis Markers  Recent Labs Lab 2016/10/22 1325 Oct 22, 2016 1417 10-22-16 1600 10-22-2016 1924 10/12/16 0454 10/13/16 0130  LATICACIDVEN 5.27*  --  3.0* 2.7*  --   --   PROCALCITON  --  37.31  --   --  32.02 27.38    ABG No results for input(s): PHART, PCO2ART, PO2ART in the last 168 hours.  Liver Enzymes  Recent Labs Lab 10-22-2016 1237 10/13/16 0130 10/15/16 0435 10/16/16 0452  AST 31 25  --   --  ALT 14 14  --   --   ALKPHOS 62 52  --   --   BILITOT 2.4* 2.2*  --   --   ALBUMIN 2.5* 2.2* 1.9* 1.9*    Cardiac Enzymes  Recent Labs Lab 10/13/16 0442 10/13/16 1024 10/13/16 1629  TROPONINI 0.27* 0.23* 0.20*    Glucose  Recent Labs Lab 10/15/16 0807 10/15/16 1147 10/15/16 1623 10/16/16 0008 10/16/16 0401 10/16/16 0839  GLUCAP 165* 204* 218* 194* 205* 185*    Imaging Dg Chest Port 1 View  Result Date: 10/16/2016 CLINICAL DATA:  66 year old female respiratory failure. Hypertension. Subsequent  encounter. EXAM: PORTABLE CHEST 1 VIEW COMPARISON:  10/15/2016. FINDINGS: AICD enters from the left with tip unchanged in position. Cardiomegaly. Right central line tip distal superior vena cava/ cavoatrial junction level unchanged. No pneumothorax. Bibasilar consolidation may represent pleural fluid layering posteriorly with atelectasis. Cannot exclude basilar infiltrate or mass. When compared to prior examination, interval shifting of right-sided pleural effusion. Central pulmonary vascular prominence. IMPRESSION: Shifting of right-sided pleural effusion. Consolidation lung bases may represent pleural fluid with associated basilar atelectasis although difficult to exclude basilar infiltrate or mass. Cardiomegaly with AICD in place. Central pulmonary vascular prominence. Electronically Signed   By: Lacy Duverney M.D.   On: 10/16/2016 07:51     STUDIES:  CT A/P 5/9 >> Moderate Volume Ascites, interval development of an apparent extraperitoneal fluid collection tracking deep to the rectus sheath beginning just cranial to the umbilicus and tracking down towards the symphysis pubis. Seroma/Hematoma consideration, infection not excluded, fluid collection tracking along the anterolateral right lower abdominal wall appears to be extraperitoneal although, bilateral fluid collections identified in the groin regions, moderate ascites, cardiomegaly  CXR 5/9 >>  bibasilar atelectasis, chronic marked cardiomegaly   CULTURES: UC 5/10 >> neg Blood 5/9 >> 1/2 Ecoli >> pan sensitive BC 5/13 >>  ANTIBIOTICS: Vancomycin 5/9 >> 5/10; 5/14 >> Zosyn 5/9 >> 5/10; 5/14 >> Clindamycin 5/9 >> 5/10 Rocephin 5/10 >> 5/14  SIGNIFICANT EVENTS: 5/09  Admitted as Code STEMI  5/11  Remains on levo gtt, atrial flutter overnight, cardiology consulted, diuresing / on milrinone 5/13  Remains on pressors, hypotensive, new RLL infiltrate (suspected aspiration), worsening AKI  LINES/TUBES: R IJ 5/9 >>  DISCUSSION: 66 year old  female with extensive cardiac history admitted on 5/9 as Code STEMI. While in Cath-lab after being administered heparin she was noted to have increase in size of hernias, with concern for internal bleeding cath was held and patient was taken back to ED. In ED patient had significant hypotension requiring levophed gtt. Work up found her to have Ecoli bacteremia & abdominal hematoma on CT A/P.   ASSESSMENT / PLAN:  PULMONARY A: Pulmonary Edema -  RLL airspace disease - new infiltrate vs atelectasis, suspect aspiration is infiltrate given weakness Hypoxia P:   O2 to support saturations > 90% Pulmonary hygiene with IS Aspiration precautions Intermittent chest xray  BiPAP prn   CARDIOVASCULAR A:  STEMI Hypotension related to STEMI + E. Coli bacteremia sepsis  H/O CHF (EF 20-25), CAD S/P ICD, A.Flutter  Mild Pulm Edema PAFib/flutter P:  Appreciate Cardiology assistance Defer amiodarone, heparin drip, and milrinone to Cards Continue with levophed and neo for map >65 ICU monitoring Trend co-ox Continue  Continue stress steroids solucortef 50mg  q 6 Defer levophed / milrinone to Cardiology Holding home lopressor, lipitor, aldactone, asa  Hold coreg with hypotension Will need ongoing discussions with family for goals of care, currently a LCB  RENAL A:   Acute Kidney Injury 2/2 shock ( STEMI + sepsis with Ecoli bacteremia) Lactic Acidosis  P:   Nephrology following, appreciate assistance Family is deciding about CRRT and ongoing goals of care Holding lasix given worsening sCr Trend BMP / urinary output Replace electrolytes as indicated Avoid nephrotoxic agents, ensure adequate renal perfusion   GASTROINTESTINAL A:   Cirrhosis  Malnutrition, moderate S/P Recent Hernia (Umbilical + Obturator) Repair with Seroma / Hematoma  P:  Diet per SLP Aspiration precautions Protonix for SUP  HEMATOLOGIC A:   Leukopenia  Leukocytosis Suspected Abdominal Hematoma - increase in  size of hernias during cath  Anemia P:  Trend CBC/ fever curve  Continue Heparin per pharmacy/cards Monitor for bleeding Transfuse per ICU guidelines   INFECTIOUS A:   Septic Shock 2/2 Ecoli bacteremia.  Significant increase in WBC from 15-> 34.5-> 55, ? Source new RLL inflitrate/ GI source P:   D/c ceftriaxone Broaden abx to vanc and zosyn Follow cultures Continue stress steroids for septic shock  Continue ceftriaxone, D4/x Follow cultures as above Stress steroids for septic shock   ENDOCRINE A:   Hypoglycemia - resolved  P:   Trend glucose on BMP  NEUROLOGIC A:   Acute Encephalopathy - resolved   P:   Ongoing monitoring/ supportive care Minimize sedating meds as able  FAMILY  - Updates: 5/13- family meeting held, updated on declining condition.  Plan to continue aggressive medical care but agrees to DNR/ DNI.  5/14 - Patient's son and sister at the bedside and updated.  Her condition continues to decline despite aggressive care.  They spoke earlier this am with Dr. Kathrene BongoGoldsborough concerning possible CRRT.  They are still discussing plan of care, and did not seem receptive to comfort care at this time.  Will broaden antibiotics and continue aggressive medical care w/DNR/ DNI. Plan for ongoing discussions to see how patient responds to therapies.    - Inter-disciplinary family meet or Palliative Care meeting due by: 5/16    CC Time: 50 minutes   Posey BoyerBrooke Simpson, AGACNP-BC  Pulmonary & Critical Care Pgr: [phone] or if no answer [phone] 10/16/2016, 8:57 AM

## 2016-10-16 NOTE — Plan of Care (Addendum)
  Interdisciplinary Goals of Care Family Meeting   Date carried out:: 10/16/2016  Location of the meeting: Bedside  Member's involved: Physician, Bedside Registered Nurse and Family Member or next of kin  Durable Power of Attorney or acting medical decision maker: Son    Discussion: We discussed goals of care for [employer] CorporationCarolyn Gearhart .   With the worsening renal function she is likely headed for dialysis. Multiple discussions were held with the nephrology, cardiology team and the family to discuss goals of care and management. The family does not want to give up just yet. They want her to start CRRT. They're aware that her respiratory status may continue to decline and are okay with intubation. I had a frank discussion and told them that the chance of recovery are very limited and prognosis is very poor.   We'll continue daily discussions with the family. If she does not turn around in the next few days then they may be more amenable to limiting care if they are reassured that we have done everything medically possible. She remains DO NOT RESUSCITATE in case of cardiac arrest.  Code status: Limited Code or DNR with short term intubation  Disposition: Continue current acute care  Time spent for the meeting: 15 mins  Maydelin Deming 10/16/2016, 11:49 AM

## 2016-10-16 NOTE — Progress Notes (Signed)
Pt. Blood glucose read less than 10 off of A-line.  50 ml of 50% dextrose given.  Re-checked approx.  15 min later for a blood glucose reading of  277.   Na+ came back 119. Reported to nephology.  Troponin came back at .58.  Reported to CCM.

## 2016-10-16 NOTE — Progress Notes (Signed)
Progress Note  Patient Name: Sheri Dunn Date of Encounter: 10/16/2016  Primary Cardiologist: Nakenya Theall  Subjective   She is sleepy and just slightly confused. She answers most questions coherently. She repeatedly states that she wants to get out of bed.. She appears acutely ill. She is on vasopressor infusions with milrinone, norepinephrine, phenylephrine.  Rhythm remains atrial flutter with mostly 2:1 AV block and ventricular rate of 118 bpm. Severely oliguric - total urine output only 125 mL last 24 hours, only 15 mL last shift Generalized edema, most prominently ascites. Blood cultures positive for pansensitive Escherichia coli (from May 9)   Inpatient Medications    Scheduled Meds: . carvedilol  3.125 mg Oral BID WC  . Chlorhexidine Gluconate Cloth  6 each Topical Q0600  . hydrocortisone sod succinate (SOLU-CORTEF) inj  50 mg Intravenous Q6H  . insulin aspart  0-9 Units Subcutaneous Q4H  . pantoprazole (PROTONIX) IV  40 mg Intravenous Q24H  . polyethylene glycol  17 g Oral BID  . senna-docusate  1 tablet Oral BID   Continuous Infusions: . sodium chloride    . sodium chloride    . amiodarone 30 mg/hr (10/16/16 0800)  . heparin 1,150 Units/hr (10/16/16 0800)  . milrinone 0.25 mcg/kg/min (10/16/16 0800)  . norepinephrine (LEVOPHED) Adult infusion 40 mcg/min (10/16/16 0800)  . phenylephrine (NEO-SYNEPHRINE) Adult infusion 20 mcg/min (10/16/16 0800)  . vasopressin (PITRESSIN) infusion - *FOR SHOCK* Stopped (10/16/16 0456)   PRN Meds: sodium chloride, acetaminophen, acetaminophen, alum & mag hydroxide-simeth, bisacodyl, lip balm, magic mouthwash, menthol-cetylpyridinium, metoprolol, oxyCODONE, phenol, polyethylene glycol   Vital Signs    Vitals:   10/16/16 0645 10/16/16 0700 10/16/16 0800 10/16/16 0844  BP: (!) 94/55 (!) 99/46 110/75   Pulse: (!) 117 (!) 117 78   Resp: (!) 26 (!) 28 (!) 31   Temp:    97.6 F (36.4 C)  TempSrc:    Oral  SpO2: 93% 93% 90%     Weight:      Height:        Intake/Output Summary (Last 24 hours) at 10/16/16 1019 Last data filed at 10/16/16 0800  Gross per 24 hour  Intake          1756.16 ml  Output               35 ml  Net          1721.16 ml   Filed Weights   10/14/16 0418 10/15/16 0440 10/16/16 0445  Weight: 136 lb 3.9 oz (61.8 kg) 141 lb 5 oz (64.1 kg) 137 lb 12.6 oz (62.5 kg)    Telemetry    Atrial flutter with consistent 2:1 AV block, rarely 3:1 AV block - Personally Reviewed  ECG    5/12 Atrial flutter with variable AV block and intermittent aberrancy. - Personally Reviewed  Physical Exam  Groggy, appears uncomfortable but not agitated  GEN: sleepy, but oriented when awoken   Neck: JVD to angle of jaw bilaterally  Cardiac: RRR, no murmurs, rubs,summation gallop  Respiratory: Clear to auscultation bilaterally. Especially on right side, diminished breath sounds in bases  GI: distended due to ascites, minimally tender  MS: mild  edema; No deformity. Neuro:  Nonfocal  Psych: Normal affect   Labs    Chemistry Recent Labs Lab 10/10/2016 1237  10/13/16 0130  10/14/16 0420 10/15/16 0435 10/16/16 0452  NA 132*  < > 125*  < > 125* 124* 122*  K 4.6  < > 3.5  < > 3.7  4.0 4.2  CL 98*  < > 90*  < > 91* 89* 87*  CO2 17*  < > 21*  < > 22 22 20*  GLUCOSE 43*  < > 89  < > 111* 163* 211*  BUN 53*  < > 57*  < > 64* 85* 115*  CREATININE 2.63*  < > 2.17*  < > 2.27* 2.57* 3.03*  CALCIUM 8.0*  < > 7.4*  < > 8.0* 7.9* 8.1*  PROT 5.8*  --  5.5*  --   --   --   --   ALBUMIN 2.5*  --  2.2*  --   --  1.9* 1.9*  AST 31  --  25  --   --   --   --   ALT 14  --  14  --   --   --   --   ALKPHOS 62  --  52  --   --   --   --   BILITOT 2.4*  --  2.2*  --   --   --   --   GFRNONAA 18*  < > 22*  < > 21* 18* 15*  GFRAA 21*  < > 26*  < > 25* 21* 17*  ANIONGAP 17*  < > 14  < > 12 13 15   < > = values in this interval not displayed.   Hematology Recent Labs Lab 10/14/16 0420 10/15/16 0435 10/16/16 0452  WBC  15.0* 34.5* 55.0*  RBC 3.23* 3.72* 3.79*  HGB 8.7* 10.4* 10.5*  HCT 25.4* 28.7* 28.5*  MCV 78.6 77.2* 75.2*  MCH 26.9 28.0 27.7  MCHC 34.3 36.2* 36.8*  RDW 16.7* 17.1* 17.3*  PLT 208 195 171    Cardiac Enzymes Recent Labs Lab 10/12/16 0304 10/13/16 0442 10/13/16 1024 10/13/16 1629  TROPONINI 0.39* 0.27* 0.23* 0.20*    Recent Labs Lab 10/10/2016 1204  TROPIPOC 0.88*      Radiology    Dg Chest Port 1 View  Result Date: 10/16/2016 CLINICAL DATA:  66 year old female respiratory failure. Hypertension. Subsequent encounter. EXAM: PORTABLE CHEST 1 VIEW COMPARISON:  10/15/2016. FINDINGS: AICD enters from the left with tip unchanged in position. Cardiomegaly. Right central line tip distal superior vena cava/ cavoatrial junction level unchanged. No pneumothorax. Bibasilar consolidation may represent pleural fluid layering posteriorly with atelectasis. Cannot exclude basilar infiltrate or mass. When compared to prior examination, interval shifting of right-sided pleural effusion. Central pulmonary vascular prominence. IMPRESSION: Shifting of right-sided pleural effusion. Consolidation lung bases may represent pleural fluid with associated basilar atelectasis although difficult to exclude basilar infiltrate or mass. Cardiomegaly with AICD in place. Central pulmonary vascular prominence. Electronically Signed   By: Lacy Duverney M.D.   On: 10/16/2016 07:51   Dg Chest Port 1 View  Result Date: 10/15/2016 CLINICAL DATA:  Acute respiratory failure.  Hypoxia. EXAM: PORTABLE CHEST 1 VIEW COMPARISON:  Oct 13, 2016 FINDINGS: No pneumothorax. New opacity and probable associated effusion in the right base. Stable cardiomegaly. Focal opacity in the left retrocardiac region is similar to minimally improved. There may be minimal overlying pulmonary venous hypertension. The right IJ is stable. The AICD device is stable. No other interval change. IMPRESSION: 1. New infiltrate and possible associated effusion  in the right base. Stable infiltrate in the left retrocardiac region. Pulmonary venous hypertension. No other change. Electronically Signed   By: Gerome Sam III M.D   On: 10/15/2016 07:18    Cardiac Studies   ECHO 10/12/2016 -  Left ventricle: The cavity size was normal. Systolic function was   moderately to severely reduced. The estimated ejection fraction   was in the range of 30% to 35%. Wall motion was normal; there   were no regional wall motion abnormalities. Doppler parameters   are consistent with abnormal left ventricular relaxation (grade 1   diastolic dysfunction). - Ventricular septum: Septal motion showed abnormal function and   dyssynergy. - Aortic valve: There was moderate regurgitation. - Mitral valve: There was mild regurgitation. - Left atrium: The atrium was mildly dilated. - Right ventricle: The cavity size was mildly dilated. Wall   thickness was normal. - Right atrium: The atrium was severely dilated. - Tricuspid valve: There was mild regurgitation. - Pulmonary arteries: Systolic pressure was mildly increased. PA   peak pressure: 42 mm Hg (S).  Impressions:  - EF is mildly improved when compared to prior. (20%)  Patient Profile     66 y.o. 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  Underwent bilateral inguinal and obturator hernia repairs on April 26, kept in hospital for decompensated heart failure. Discharge May 5 and readmitted with hypotension May 9. Initial concern for new acute myocardial infarction was not confirmed by cardiac enzymes and repeat echo. May 9 blood cultures growing Escherichia coli.  Assessment & Plan    1. Shock: There is definitely a component of cardiogenic shock and she has always had prominent findings of right heart failure. There appears  to also be a component of septic shock. Currently on 3 different pressors. Note that she has relatively good mixed venous O2 saturation. She is not behaving as low cardiac output, suggesting that sepsis is an important component of her shock (however similar pattern of high output can be seen in patients with cirrhosis). 2. Anuric ARF: She is developing marked volume overload. Also starting to have some mental status changes, possibly related to hyponatremia, which is steadily worsening. 3. CHF:  Thankfully this has been mostly right heart failure findings, but now is starting to have some drop in oxygen saturation. Only option for volume removal will be CRRT. She is too sick for conventional hemodialysis. 4. CAD: Initial impression of acute MI was not confirmed based on trivial elevation in cardiac enzymes and absence of new wall motion abnormalities or decrease in EF by echo. Her EKG changes were likely related to severe hypotension. No indication for cardiac catheterization at this time  5. Sepsis: Antibiotics tailored for identified GNR organism. 6. Cirrhosis: Suggested by imaging studies and may be related to chronic right heart failure. Complicates the situation due to development of prominent ascites, undermining the surgical results of her hernia repair and putting her at risk for peritonitis. Might also explain why her cardiac output is relatively high.  Her overall prognosis is very poor. She will not survive without temporary dialysis, but is a very poor candidate for chronic hemodialysis. I think there is a high likelihood she will not make it out of this acute illness alive, despite our best efforts. I discussed this with the patient's family and suggested that they might consider palliative care. They made it clear that they want no holds barred full effort including intubation-mechanical ventilation and CRRT dialysis if necessary.  Critically ill, multiple organ failure, complicated decision  making.   Signed, Thurmon FairMihai Clayden Withem, MD  10/16/2016, 10:19 AM   Time: 60  minutes

## 2016-10-16 NOTE — Progress Notes (Signed)
Spoke to CCM- the plan will be to go ahead and initiate CRRT at this time but understanding that patient is not likely to improve significantly- plan to re approach family daily to continue discussions.  Will write orders for CRRT once vascath placed - no heparin- all 4 K dialysate.  Attempt 50-100 fluid removal per hour   Jules Baty A

## 2016-10-16 NOTE — Progress Notes (Signed)
CRITICAL VALUE ALERT  Critical value received:  Lactic Acid 2.0  Date of notification:  10/16/16  Time of notification:  1155  Critical value read back:yes  Nurse who received alert:  Michail JewelsJanice Raziel Koenigs  MD notified (1st page):  Posey BoyerBrooke Simpson, NP  Time of first page:  1156  MD notified (2nd page):  Time of second page:  Responding MD:  Posey BoyerBrooke Simpson, NP  Time MD responded:  [phone]

## 2016-10-16 NOTE — Progress Notes (Addendum)
ANTICOAGULATION CONSULT NOTE - Follow Up Consult  Pharmacy Consult for Heparin  Indication: atrial fibrillation  Allergies  Allergen Reactions  . No Known Allergies     Patient Measurements: Height: 5\' 6"  (167.6 cm) Weight: 137 lb 12.6 oz (62.5 kg) IBW/kg (Calculated) : 59.3  Vital Signs: Temp: 97.6 F (36.4 C) (05/14 0844) Temp Source: Oral (05/14 0844) BP: 90/48 (05/14 1500) Pulse Rate: 114 (05/14 1500)  Labs:  Recent Labs  10/13/16 1629  10/14/16 0420  10/15/16 0435 10/15/16 1358 10/16/16 0452 10/16/16 1455  HGB 8.2*  --  8.7*  --  10.4*  --  10.5*  --   HCT 23.9*  --  25.4*  --  28.7*  --  28.5*  --   PLT  --   --  208  --  195  --  171  --   HEPARINUNFRC  --   < >  --   < > 0.33 0.46 0.63 0.80*  CREATININE 1.94*  --  2.27*  --  2.57*  --  3.03*  --   TROPONINI 0.20*  --   --   --   --   --   --   --   < > = values in this interval not displayed.  Estimated Creatinine Clearance: 16.9 mL/min (A) (by C-G formula based on SCr of 3.03 mg/dL (H)).    Assessment: 66 y/o F on heparin for afib, heparin level supratherapeutic at 0.80 at 1150 units/hr. This is up from 0.63 just 8 hours ago despite a lower dose. Patient's renal function has been declining and CRRT was started this afternoon. Given bleeding issues earlier this admission due to hernia surgery on 09/28/16, there is definitely concern over supratherapeutic levels.   Goal of Therapy:  Heparin level 0.3-0.7 units/ml Monitor platelets by anticoagulation protocol: Yes   Plan:  -Decrease heparin to 1000 units/hr -8 hr HL -Daily CBC/HL -Monitor S/Sx bleeding  Gwyndolyn KaufmanKai Naftoli Penny Bernette Redbird(Kenny), PharmD  PGY1 Pharmacy Resident Pager: [phone] 10/16/2016 3:43 PM

## 2016-10-16 NOTE — Procedures (Signed)
Central Venous dialysis Catheter Insertion Procedure Note Romona CurlsCarolyn Szczerba [id number] 12/25/1950  Procedure: Insertion of Central Venous Catheter Indications: hemodialysis/CRRT  Procedure Details Consent: Risks of procedure as well as the alternatives and risks of each were explained to the (patient/caregiver).  Consent for procedure obtained. Time Out: Verified patient identification, verified procedure, site/side was marked, verified correct patient position, special equipment/implants available, medications/allergies/relevent history reviewed, required imaging and test results available.  Performed Real time US was used to ID and cannulate the vessel  Maximum sterile technique was used including antiseptics, cap, gloves, gown, hand hygiene, mask and sheet. Skin prep: Chlorhexidine; local anesthetic administered A antimicrobial bonded/coated triple lumen catheter was placed in the left internal jugular vein using the Seldinger technique.  Evaluation Blood flow good Complications: No apparent complications Patient did tolerate procedure well. Chest X-ray ordered to verify placement.  CXR: pending.  Shelby MattocksPete E Garnie Borchardt & NP Drake LeachSimpson   Ladene Allocca E Annastacia Duba ACNP-BC [facility] Pulmonary/Critical Care Pager # [phone] OR # [phone] if no answer  10/16/2016, 2:31 PM

## 2016-10-16 NOTE — Procedures (Signed)
Arterial Catheter Insertion Procedure Note Sheri CurlsCarolyn Dunn [id number] June 06, 1950  Procedure: Insertion of Arterial Catheter  Indications: Blood pressure monitoring  Procedure Details Consent: Risks of procedure as well as the alternatives and risks of each were explained to the (patient/caregiver).  Consent for procedure obtained. Time Out: Verified patient identification, verified procedure, site/side was marked, verified correct patient position, special equipment/implants available, medications/allergies/relevent history reviewed, required imaging and test results available.  Performed  Maximum sterile technique was used including antiseptics, cap, gloves, gown, hand hygiene, mask and sheet. Skin prep: Chlorhexidine; local anesthetic administered 20 gauge catheter was inserted into right radial artery using the Seldinger technique.  Evaluation Blood flow good; BP tracing good. Complications: No apparent complications.   Sheri Dunn, Sheri Dunn 10/16/2016

## 2016-10-16 NOTE — Progress Notes (Signed)
Pt. Family consented to have CRRT started on Ms. Sheri Dunn this am 10/16/16.  HD cath put in place and as CRRT was being set up about 1630 Ms. Sheri Dunn continually stated that she did not want dialysis. When sister walked in the room and heard her saying this she wanted to pause me hooking her up so they could meet with the rest of the family and Dr. Isaiah SergeMannam again.  I paged Dr. Isaiah SergeMannam and before he got here family and patient both said they did want CRRT.   Dr. Isaiah SergeMannam arrived and discussed further with family and everyone is now in agreeance to begin CRRT.  I will start CRRT and monitor patient closely.

## 2016-10-16 NOTE — Plan of Care (Signed)
Problem: Cardiac: Goal: Ability to achieve and maintain adequate cardiopulmonary perfusion will improve Outcome: Not Progressing Pt requiring increasing levels of pressor support at this time.

## 2016-10-16 NOTE — Progress Notes (Signed)
Subjective:  Overnight had hypoglycemia and continued hypotension- required addition of phenylephrine- not much UOP- kidney numbers worsening- I did speak to the family  Objective Vital signs in last 24 hours: Vitals:   10/16/16 0615 10/16/16 0630 10/16/16 0645 10/16/16 0700  BP: (!) 99/55 (!) 93/56 (!) 94/55 (!) 99/46  Pulse: (!) 107 (!) 116 (!) 117 (!) 117  Resp: (!) 27 (!) 28 (!) 26 (!) 28  Temp:      TempSrc:      SpO2: 93% 92% 93% 93%  Weight:      Height:       Weight change: -1.6 kg (-3 lb 8.4 oz)  Intake/Output Summary (Last 24 hours) at 10/16/16 0742 Last data filed at 10/16/16 0700  Gross per 24 hour  Intake          1929.71 ml  Output              125 ml  Net          1804.71 ml    Assessment/ Plan: [name] with previous EF 20-25% who was admitted on 10/14/2016 with STEMI, then development of imtraabdominal hematoma and E. Coli bacteremia causing shock/hypotension and now AKI Assessment/Plan: 1. Renal- AKI secondary to hemodynamics in the setting of shock- possibly both cardiogenic and septic- essentially no UOP.  Would need CRRT if plan is for aggressive care- however I see is limited code and usually patients with this poor of cardiac function do not do well with RRT.  Has had clinical decline- requirement for more pressor support.  We will take CCM lead on this 2. Cardiac- prior EF 20-25 % - recent STEMI and no ability to evaluate coronary status.  Is requiring milrinone. Amiodarone, norepi and phenylephrine at this time- marginal BP  3. Anemia- hgb over 10 at this time s/p transfusion 4. ID- WBC climbing- on rocephin- indicates infection is not being eradicated- thought to be intraabdominal source but patient not a candidate for surgery  5. HTN/volume- volume overloaded with hyponatremia- will not respond to diuretics- only avenue to remove volume would be CRRT  6. Dispo- I spoke to family - said that there were things that dialysis would not fix, ie her  infection with WBC 55 and her heart but want to discuss things with CCM and take their lead as to if we will do CRRT- which would likely be needed in the next 24 hours   Sulma Ruffino A    Labs: Basic Metabolic Panel:  Recent Labs Lab 10/14/16 0420 10/15/16 0435 10/16/16 0452  NA 125* 124* 122*  K 3.7 4.0 4.2  CL 91* 89* 87*  CO2 22 22 20*  GLUCOSE 111* 163* 211*  BUN 64* 85* 115*  CREATININE 2.27* 2.57* 3.03*  CALCIUM 8.0* 7.9* 8.1*  PHOS 5.0* 5.1* 5.3*   Liver Function Tests:  Recent Labs Lab 10/06/2016 1237 10/13/16 0130 10/15/16 0435 10/16/16 0452  AST 31 25  --   --   ALT 14 14  --   --   ALKPHOS 62 52  --   --   BILITOT 2.4* 2.2*  --   --   PROT 5.8* 5.5*  --   --   ALBUMIN 2.5* 2.2* 1.9* 1.9*   No results for input(s): LIPASE, AMYLASE in the last 168 hours.  Recent Labs Lab 10/16/2016 1420  AMMONIA 34   CBC:  Recent Labs Lab 10/28/2016 1208  10/12/16 1254 10/13/16 0130  10/14/16 0420  10/15/16 0435 10/16/16 0452  WBC 3.9*  < > 3.0* 8.0  --  15.0* 34.5* 55.0*  NEUTROABS 2.7  --   --   --   --   --   --   --   HGB 9.8*  < > 7.7* 7.2*  < > 8.7* 10.4* 10.5*  HCT 29.0*  < > 22.6* 21.5*  < > 25.4* 28.7* 28.5*  MCV 81.7  < > 79.6 78.8  --  78.6 77.2* 75.2*  PLT 311  < > 238 222  --  208 195 171  < > = values in this interval not displayed. Cardiac Enzymes:  Recent Labs Lab 10/24/2016 2017 10/12/16 0304 10/13/16 0442 10/13/16 1024 10/13/16 1629  TROPONINI 0.46* 0.39* 0.27* 0.23* 0.20*   CBG:  Recent Labs Lab 10/15/16 0807 10/15/16 1147 10/15/16 1623 10/16/16 0008 10/16/16 0401  GLUCAP 165* 204* 218* 194* 205*    Iron Studies: No results for input(s): IRON, TIBC, TRANSFERRIN, FERRITIN in the last 72 hours. Studies/Results: Dg Chest Port 1 View  Result Date: 10/15/2016 CLINICAL DATA:  Acute respiratory failure.  Hypoxia. EXAM: PORTABLE CHEST 1 VIEW COMPARISON:  Oct 13, 2016 FINDINGS: No pneumothorax. New opacity and probable  associated effusion in the right base. Stable cardiomegaly. Focal opacity in the left retrocardiac region is similar to minimally improved. There may be minimal overlying pulmonary venous hypertension. The right IJ is stable. The AICD device is stable. No other interval change. IMPRESSION: 1. New infiltrate and possible associated effusion in the right base. Stable infiltrate in the left retrocardiac region. Pulmonary venous hypertension. No other change. Electronically Signed   By: Gerome Sam III M.D   On: 10/15/2016 07:18   Medications: Infusions: . sodium chloride    . sodium chloride    . amiodarone 30 mg/hr (10/16/16 0700)  . cefTRIAXone (ROCEPHIN)  IV Stopped (10/15/16 1237)  . heparin 1,150 Units/hr (10/16/16 0700)  . milrinone 0.25 mcg/kg/min (10/16/16 0700)  . norepinephrine (LEVOPHED) Adult infusion 40 mcg/min (10/16/16 0700)  . phenylephrine (NEO-SYNEPHRINE) Adult infusion 20 mcg/min (10/16/16 0700)  . vasopressin (PITRESSIN) infusion - *FOR SHOCK* Stopped (10/16/16 0456)    Scheduled Medications: . carvedilol  3.125 mg Oral BID WC  . Chlorhexidine Gluconate Cloth  6 each Topical Q0600  . hydrocortisone sod succinate (SOLU-CORTEF) inj  50 mg Intravenous Q6H  . insulin aspart  0-9 Units Subcutaneous Q4H  . polyethylene glycol  17 g Oral BID  . senna-docusate  1 tablet Oral BID    have reviewed scheduled and prn medications.  Physical Exam: General: laying on side- says she feels more comfortable with that Heart: tachy Lungs: CBS bilat- claims no SOB- on 3 litersbut with dropping sat Abdomen: firm area- she says is not tender Extremities: not much peripheral edema surprisingly     10/16/2016,7:42 AM  LOS: 5 days

## 2016-10-16 NOTE — Progress Notes (Signed)
eLink Physician-Brief Progress Note Patient Name: Romona CurlsCarolyn Sanderford DOB: March 02, 1951 MRN: [id number]   Date of Service  10/16/2016  HPI/Events of Note  Low CBG  BP 90/60, MAP 60 +  Septic shock 2/2 ecoli bacteremia +  STEMI  eICU Interventions  Limited code status Only pressors. Tolerating blood pressure 90/60, MAP 60. If blood pressure continues to drop, we will add Neo-Synephrine.   We'll place CBG coverage.      Intervention Category Evaluation Type: Other  Hanna Ra Angelo A De Dios 10/16/2016, 1:10 AM

## 2016-10-16 NOTE — Progress Notes (Signed)
eLink Physician-Brief Progress Note Patient Name: Sheri CurlsCarolyn Dunn DOB: Aug 13, 1950 MRN: [id number]   Date of Service  10/16/2016  HPI/Events of Note  BP 70 systolic.  On high levophed dose.  May use pressors.   eICU Interventions  Will add neosynpehrine + vasopressin.         Cimone Fahey 342 Goldfield StreetAngelo A De Dios 10/16/2016, 4:09 AM

## 2016-10-16 NOTE — Progress Notes (Signed)
ANTICOAGULATION CONSULT NOTE - Follow Up Consult  Pharmacy Consult for Heparin  Indication: atrial fibrillation  Allergies  Allergen Reactions  . No Known Allergies     Patient Measurements: Height: 5\' 6"  (167.6 cm) Weight: 137 lb 12.6 oz (62.5 kg) IBW/kg (Calculated) : 59.3  Vital Signs: Temp: 97.8 F (36.6 C) (05/14 0000) Temp Source: Axillary (05/14 0000) BP: 95/50 (05/14 0515) Pulse Rate: 117 (05/14 0515)  Labs:  Recent Labs  10/13/16 1024  10/13/16 1629  10/14/16 0420  10/15/16 0435 10/15/16 1358 10/16/16 0452  HGB  --   < > 8.2*  --  8.7*  --  10.4*  --  10.5*  HCT  --   < > 23.9*  --  25.4*  --  28.7*  --  28.5*  PLT  --   --   --   --  208  --  195  --  171  HEPARINUNFRC  --   --   --   < >  --   < > 0.33 0.46 0.63  CREATININE  --   < > 1.94*  --  2.27*  --  2.57*  --  3.03*  TROPONINI 0.23*  --  0.20*  --   --   --   --   --   --   < > = values in this interval not displayed.  Estimated Creatinine Clearance: 16.9 mL/min (A) (by C-G formula based on SCr of 3.03 mg/dL (H)).    Assessment: 66 y/o F on heparin for afib, heparin level therapeutic at 0.63 at 1250 units/hr. However, this is up from 0.46 just 12 hours ago. There is concern for accumulation given patient's declining renal function. Would also want HL at lower end of goal given bleeding issues when patient was first admitted.   Goal of Therapy:  Heparin level 0.3-0.7 units/ml Monitor platelets by anticoagulation protocol: Yes   Plan:  -Decrease heparin to 1150 units/hr -8 hr HL -Daily CBC/HL -Monitor S/Sx bleeding  Gwyndolyn KaufmanKai Paulino Cork Bernette Redbird(Kenny), PharmD  PGY1 Pharmacy Resident Pager: [phone] 10/16/2016 6:28 AM

## 2016-10-17 ENCOUNTER — Inpatient Hospital Stay (HOSPITAL_COMMUNITY): Payer: Medicare HMO

## 2016-10-17 DIAGNOSIS — I480 Paroxysmal atrial fibrillation: Secondary | ICD-10-CM

## 2016-10-17 LAB — RENAL FUNCTION PANEL
ANION GAP: 10 (ref 5–15)
Albumin: 1.9 g/dL — ABNORMAL LOW (ref 3.5–5.0)
Albumin: 1.9 g/dL — ABNORMAL LOW (ref 3.5–5.0)
Albumin: 1.9 g/dL — ABNORMAL LOW (ref 3.5–5.0)
Anion gap: 12 (ref 5–15)
Anion gap: 12 (ref 5–15)
BUN: 81 mg/dL — ABNORMAL HIGH (ref 6–20)
BUN: 74 mg/dL — ABNORMAL HIGH (ref 6–20)
BUN: 53 mg/dL — ABNORMAL HIGH (ref 6–20)
CHLORIDE: 97 mmol/L — AB (ref 101–111)
CHLORIDE: 100 mmol/L — AB (ref 101–111)
CO2: 21 mmol/L — ABNORMAL LOW (ref 22–32)
CO2: 19 mmol/L — ABNORMAL LOW (ref 22–32)
CO2: 22 mmol/L (ref 22–32)
Calcium: 7.9 mg/dL — ABNORMAL LOW (ref 8.9–10.3)
Calcium: 7.7 mg/dL — ABNORMAL LOW (ref 8.9–10.3)
Calcium: 7.8 mg/dL — ABNORMAL LOW (ref 8.9–10.3)
Chloride: 96 mmol/L — ABNORMAL LOW (ref 101–111)
Creatinine, Ser: 2.26 mg/dL — ABNORMAL HIGH (ref 0.44–1.00)
Creatinine, Ser: 2.05 mg/dL — ABNORMAL HIGH (ref 0.44–1.00)
Creatinine, Ser: 1.62 mg/dL — ABNORMAL HIGH (ref 0.44–1.00)
GFR calc Af Amer: 25 mL/min — ABNORMAL LOW (ref >60)
GFR calc Af Amer: 37 mL/min — ABNORMAL LOW (ref >60)
GFR calc non Af Amer: 21 mL/min — ABNORMAL LOW (ref >60)
GFR, EST AFRICAN AMERICAN: 28 mL/min — AB (ref >60)
GFR, EST NON AFRICAN AMERICAN: 24 mL/min — AB (ref >60)
GFR, EST NON AFRICAN AMERICAN: 32 mL/min — AB (ref >60)
Glucose, Bld: 171 mg/dL — ABNORMAL HIGH (ref 65–99)
Glucose, Bld: 156 mg/dL — ABNORMAL HIGH (ref 65–99)
Glucose, Bld: 131 mg/dL — ABNORMAL HIGH (ref 65–99)
POTASSIUM: 4.1 mmol/L (ref 3.5–5.1)
POTASSIUM: 3.9 mmol/L (ref 3.5–5.1)
POTASSIUM: 4.1 mmol/L (ref 3.5–5.1)
Phosphorus: 4 mg/dL (ref 2.5–4.6)
Phosphorus: 3.7 mg/dL (ref 2.5–4.6)
Phosphorus: 3.1 mg/dL (ref 2.5–4.6)
SODIUM: 129 mmol/L — AB (ref 135–145)
Sodium: 128 mmol/L — ABNORMAL LOW (ref 135–145)
Sodium: 132 mmol/L — ABNORMAL LOW (ref 135–145)

## 2016-10-17 LAB — COOXEMETRY PANEL
CARBOXYHEMOGLOBIN: 1.4 % (ref 0.5–1.5)
METHEMOGLOBIN: 0.9 % (ref 0.0–1.5)
O2 Saturation: 46.1 %
TOTAL HEMOGLOBIN: 9.7 g/dL — AB (ref 12.0–16.0)

## 2016-10-17 LAB — GLUCOSE, CAPILLARY
GLUCOSE-CAPILLARY: 172 mg/dL — AB (ref 65–99)
GLUCOSE-CAPILLARY: 165 mg/dL — AB (ref 65–99)
GLUCOSE-CAPILLARY: 142 mg/dL — AB (ref 65–99)
GLUCOSE-CAPILLARY: 153 mg/dL — AB (ref 65–99)
Glucose-Capillary: 130 mg/dL — ABNORMAL HIGH (ref 65–99)

## 2016-10-17 LAB — HEPARIN LEVEL (UNFRACTIONATED)
HEPARIN UNFRACTIONATED: 0.63 [IU]/mL (ref 0.30–0.70)
Heparin Unfractionated: 0.69 IU/mL (ref 0.30–0.70)
Heparin Unfractionated: 0.62 IU/mL (ref 0.30–0.70)

## 2016-10-17 LAB — PROCALCITONIN: Procalcitonin: 11.06 ng/mL

## 2016-10-17 LAB — CBC
HEMATOCRIT: 26 % — AB (ref 36.0–46.0)
Hemoglobin: 9.4 g/dL — ABNORMAL LOW (ref 12.0–15.0)
MCH: 26.9 pg (ref 26.0–34.0)
MCHC: 36.2 g/dL — AB (ref 30.0–36.0)
MCV: 74.3 fL — AB (ref 78.0–100.0)
PLATELETS: 150 10*3/uL (ref 150–400)
RBC: 3.5 MIL/uL — ABNORMAL LOW (ref 3.87–5.11)
RDW: 17 % — ABNORMAL HIGH (ref 11.5–15.5)
WBC: 58.9 10*3/uL (ref 4.0–10.5)

## 2016-10-17 LAB — AMMONIA: AMMONIA: 43 umol/L — AB (ref 9–35)

## 2016-10-17 LAB — MAGNESIUM: MAGNESIUM: 2 mg/dL (ref 1.7–2.4)

## 2016-10-17 MED ORDER — EPINEPHRINE PF 1 MG/ML IJ SOLN
0.5000 ug/min | INTRAVENOUS | Status: DC
  Filled 2016-10-17: qty 4

## 2016-10-17 MED ORDER — ACETAMINOPHEN 10 MG/ML IV SOLN
1000.0000 mg | Freq: Four times a day (QID) | INTRAVENOUS | Status: DC | PRN
  Filled 2016-10-17: qty 100

## 2016-10-17 NOTE — Progress Notes (Addendum)
ANTICOAGULATION CONSULT NOTE - Follow Up Consult  Pharmacy Consult for Heparin  Indication: atrial fibrillation  Allergies  Allergen Reactions  . No Known Allergies     Patient Measurements: Height: 5\' 6"  (167.6 cm) Weight: 156 lb 15.5 oz (71.2 kg) IBW/kg (Calculated) : 59.3  Vital Signs: Temp: 97.6 F (36.4 C) (05/15 0400) Temp Source: Axillary (05/15 0400) BP: 105/56 (05/15 0600) Pulse Rate: 108 (05/15 0600)  Labs:  Recent Labs  10/15/16 0435  10/16/16 0452 10/16/16 1455 10/16/16 1711 10/16/16 2332 10/17/16 0403 10/17/16 0404 10/17/16 0405  HGB 10.4*  --  10.5*  --   --   --   --   --  9.4*  HCT 28.7*  --  28.5*  --   --   --   --   --  26.0*  PLT 195  --  171  --   --   --   --   --  150  HEPARINUNFRC 0.33  < > 0.63 0.80*  --  0.63  --  0.69  --   CREATININE 2.57*  --  3.03*  --  3.14*  --  2.26*  --   --   TROPONINI  --   --   --   --  0.58*  --   --   --   --   < > = values in this interval not displayed.  Estimated Creatinine Clearance: 24.4 mL/min (A) (by C-G formula based on SCr of 2.26 mg/dL (H)).    Assessment: 66 y/o F on heparin for afib, heparin level therapeutic at 0.69 at 1000 units/hr, up from 0.63 4 hours earlier at 0000. Given bleeding issues earlier this admission due to hernia surgery on 09/28/16, there is definitely concern over potential supratherapeutic levels. 0.69 is the higher end of therapeutic.  Goal of Therapy:  Heparin level 0.3-0.7 units/ml Monitor platelets by anticoagulation protocol: Yes   Plan:  -Decrease heparin to 900 units/hr -8-hr HL -Daily CBC/HL -Monitor S/Sx bleeding  Gwyndolyn KaufmanKai Reyan Helle Bernette Redbird(Kenny), PharmD  PGY1 Pharmacy Resident Pager: [phone] 10/17/2016 6:30 AM

## 2016-10-17 NOTE — Progress Notes (Signed)
ANTICOAGULATION CONSULT NOTE - Follow Up Consult  Pharmacy Consult for Heparin  Indication: atrial fibrillation  Allergies  Allergen Reactions  . No Known Allergies     Patient Measurements: Height: 5\' 6"  (167.6 cm) Weight: 156 lb 15.5 oz (71.2 kg) IBW/kg (Calculated) : 59.3  Vital Signs: Temp: 97.7 F (36.5 C) (05/15 1200) Temp Source: Axillary (05/15 1200) BP: 127/56 (05/15 1500) Pulse Rate: 98 (05/15 1200)  Labs:  Recent Labs  10/15/16 0435  10/16/16 0452  10/16/16 1711 10/16/16 2332 10/17/16 0403 10/17/16 0404 10/17/16 0405 10/17/16 0701 10/17/16 1614  HGB 10.4*  --  10.5*  --   --   --   --   --  9.4*  --   --   HCT 28.7*  --  28.5*  --   --   --   --   --  26.0*  --   --   PLT 195  --  171  --   --   --   --   --  150  --   --   HEPARINUNFRC 0.33  < > 0.63  < >  --  0.63  --  0.69  --   --  0.62  CREATININE 2.57*  --  3.03*  --  3.14*  --  2.26*  --   --  2.05*  --   TROPONINI  --   --   --   --  0.58*  --   --   --   --   --   --   < > = values in this interval not displayed.  Estimated Creatinine Clearance: 26.9 mL/min (A) (by C-G formula based on SCr of 2.05 mg/dL (H)).    Assessment: 66 y/o F continues on heparin for afib, heparin level therapeutic this PM  Goal of Therapy:  Heparin level 0.3-0.7 units/ml Monitor platelets by anticoagulation protocol: Yes   Plan:  Continue heparin at 900 units / hr Follow up AM labs  Thank you Okey RegalLisa Candace Begue, PharmD [phone] 10/17/2016 5:01 PM

## 2016-10-17 NOTE — Progress Notes (Deleted)
ANTICOAGULATION CONSULT NOTE - Follow Up Consult  Pharmacy Consult for Heparin  Indication: atrial fibrillation  Allergies  Allergen Reactions  . No Known Allergies     Patient Measurements: Height: 5\' 6"  (167.6 cm) Weight: 156 lb 15.5 oz (71.2 kg) IBW/kg (Calculated) : 59.3  Vital Signs: Temp: 97.6 F (36.4 C) (05/15 0400) Temp Source: Axillary (05/15 0400) BP: 105/56 (05/15 0600) Pulse Rate: 108 (05/15 0600)  Labs:  Recent Labs  10/15/16 0435  10/16/16 0452 10/16/16 1455 10/16/16 1711 10/16/16 2332 10/17/16 0403 10/17/16 0404 10/17/16 0405  HGB 10.4*  --  10.5*  --   --   --   --   --  9.4*  HCT 28.7*  --  28.5*  --   --   --   --   --  26.0*  PLT 195  --  171  --   --   --   --   --  150  HEPARINUNFRC 0.33  < > 0.63 0.80*  --  0.63  --  0.69  --   CREATININE 2.57*  --  3.03*  --  3.14*  --  2.26*  --   --   TROPONINI  --   --   --   --  0.58*  --   --   --   --   < > = values in this interval not displayed.  Estimated Creatinine Clearance: 24.4 mL/min (A) (by C-G formula based on SCr of 2.26 mg/dL (H)).    Assessment: 66 y/o F on heparin for afib, heparin level therapeutic at 0.69 at 1000 units/hr, up from 0.63 4 hours earlier at 0000. Given bleeding issues earlier this admission due to hernia surgery on 09/28/16, there is definitely concern over potential supratherapeutic levels. 0.69 is the higher end of therapeutic.  Goal of Therapy:  Heparin level 0.3-0.7 units/ml Monitor platelets by anticoagulation protocol: Yes   Plan:  -Decrease heparin to 900 units/hr -8-hr HL -Daily CBC/HL -Monitor S/Sx bleeding  Gwyndolyn KaufmanKai Makhi Muzquiz Bernette Redbird(Kenny), PharmD  PGY1 Pharmacy Resident Pager: [phone] 10/17/2016 6:44 AM

## 2016-10-17 NOTE — Progress Notes (Signed)
ANTICOAGULATION CONSULT NOTE  Pharmacy Consult for Heparin  Indication: atrial fibrillation  Allergies  Allergen Reactions  . No Known Allergies     Patient Measurements: Height: 5\' 6"  (167.6 cm) Weight: 137 lb 12.6 oz (62.5 kg) IBW/kg (Calculated) : 59.3  Vital Signs: Temp: 94.3 F (34.6 C) (05/15 0000) Temp Source: Rectal (05/15 0000) BP: 109/55 (05/15 0000) Pulse Rate: 115 (05/15 0000)  Labs:  Recent Labs  10/14/16 0420  10/15/16 0435  10/16/16 0452 10/16/16 1455 10/16/16 1711 10/16/16 2332  HGB 8.7*  --  10.4*  --  10.5*  --   --   --   HCT 25.4*  --  28.7*  --  28.5*  --   --   --   PLT 208  --  195  --  171  --   --   --   HEPARINUNFRC  --   < > 0.33  < > 0.63 0.80*  --  0.63  CREATININE 2.27*  --  2.57*  --  3.03*  --  3.14*  --   TROPONINI  --   --   --   --   --   --  0.58*  --   < > = values in this interval not displayed.  Estimated Creatinine Clearance: 16.3 mL/min (A) (by C-G formula based on SCr of 3.14 mg/dL (H)).  Assessment: 66 y.o. female with Afib for heparin   Goal of Therapy:  Heparin level 0.3-0.7 units/ml Monitor platelets by anticoagulation protocol: Yes   Plan:  Continue Heparin at current rate Follow-up am labs.  Geannie RisenGreg Cailan General, PharmD, BCPS  10/17/2016 12:37 AM

## 2016-10-17 NOTE — Progress Notes (Signed)
PT Cancellation/Discharge Note  Patient Details Name: Sheri Dunn MRN: [id number] DOB: 02/28/1951   Cancelled Treatment:    Reason Eval/Treat Not Completed: Patient not medically ready; RN reports pt not yet stable, on CRRT at this time.  Will sign off and await new PT orders when pt able to participate.    [name] 10/17/2016, 1:52 PM  [name], [place]PT [phone] 10/17/2016

## 2016-10-17 NOTE — Progress Notes (Addendum)
Sheri  [address] [place], [place] [address] Phone: [phone] FAX: [phone]   Sheri Dunn [id number] Jul 12, 1950    Problem List:   Principal Problem:   Cardiogenic shock [facility]) Active Problems:   Ischemic cardiomyopathy   Cirrhosis, nonalcoholic   Poorly controlled ascites   Elevated troponin   CAD S/P PCI April 2016   Aortic insufficiency   Chronic combined systolic and diastolic CHF (congestive heart failure) ([place])   ICD (implantable cardioverter-defibrillator) in place   Hypertensive heart disease   Bilateral inguinal hernias s/p lap repair with mesh 09/28/2016   Obturator hernias, bilateral, s/p lap repair with mesh 4/40/3474   Umbilical hernia s/p lap repair with mesh 09/28/2016   Constipation, chronic   Acute ST elevation myocardial infarction (STEMI) of inferior wall (HCC)   BLQ abdominal wall old hernia sac seromas - LARGE   Septic shock (HCC)   Pressure injury of skin   Abdominal pain   Acute pulmonary edema (HCC)   AKI (acute kidney injury) ([place])   ARF (acute renal failure) (HCC)   Acute respiratory failure with hypoxia ([place])   6 Days Post-Op  09/28/2016  POST-OPERATIVE DIAGNOSIS:    Bilateral inguinal hernias Bilateral obturator hernias Umbilical hernia LLQ seromas  PROCEDURE:    LAPAROSCOPIC BILATERAL INGUINAL HERNIA REPAIRS WITH MESH LAPAROSCOPIC BILATERAL OBTURATOR HERNIA REPAIRS WITH MESH LAPAROSCOPIC UMBILICAL HERNIA REPAIR WITH MESH Needle aspiration of seroma INSERTION OF MESH  SURGEON:  Adin Hector, MD    Assessment  Worsening with MSOF: acute on chronic CHF, liver failure w cirrhosis, ARF,  Prognosis GRIM  Plan:  -Worsening ARF - concerning for ARF with attempts at diuresis.  Nephrology consulted.  CVVHD.  Expect poor prognosis with worsening ARF & chronic CHF & cirrhosis  Worsening MS w rise in LFTs - encephalopathy probable.  Check ammonia - see if can be Tx'd  - defer Tx to CCM.  Enemas?  ?GI consult for cirrhosis?  -can consider paracentesis to pull ascites off but concern for volume shifts.  r/o infection.  She was pain free with no peritonitis all last week.   OK to tap / paracentesis as needed for ascites relief.  Try to do in upper abdomen away from periumbilical & infraumbilical mesh.  Can check culture.  Can consider drain.  Not a surgical candidate at this time  -OK to anticoagulate from surgery standpoint  -inc WBC with high dose steroids & +BC Ecoli - agree w broad spectrum Abx.  -Seromas/hematomas in preperitoneal space & former large hernia sacs expected.  Large seromas at [facility] & R inguinal region at site of former hernias expected in pt with massive ascites. Increased slightly with increase in abdominal disention most likely from worsening ascites.  Hernia sacs filled up w ascites fluid.  Not incarcerated.  Not infected.  Soft.  Stable.  Have not been redicible since POD#6.  DO NOT SAMPLE FLUID around mesh: Increases risk of mesh infection & ascites leak markedly.  -bowel regimen for constipation.  Miralax   -Diuresis with severe CHF, cirrhosis & chronic ascites - CVVHD  -Agree with DNR/DNI.  With severe cardiac & liver disease & nonoliguric renal failure, prognosis is unfortunately grim.       Adin Hector, M.D., F.A.C.S. Gastrointestinal and Minimally Invasive Surgery [facility] [address], [place], [place] [address] [phone] Main / Paging   10/17/2016  CARE TEAM:  PCP: [name], MD  Outpatient Care  Team: Patient Care Team: [name], MD as PCP - General (Internal Medicine) Croitoru, Dani Gobble, MD as Consulting Physician (Cardiology) Michael Boston, MD as Consulting Physician (General Surgery)  Inpatient Treatment Team: Treatment Team: Attending Provider: Rush Farmer, MD; Consulting Physician: Michael Boston, MD; Rounding Team: Pccm, Md, MD; Rounding Team:  Lbcardiology, Michae Kava, MD; Consulting Physician: Sanda Klein, MD; Consulting Physician: Wellington Hampshire, MD; Consulting Physician: Fleet Contras, MD; Technician: Tera Mater, [place]; Technician: Raynelle Highland, [place]; Registered Nurse: Wilfrid Lund, RN; Physical Therapist: Max Sane, PT  Subjective:  Less alert - sleepy CVVHD - cannot pull fluid off with shock  Objective:  Vital signs:  Vitals:   10/17/16 0600 10/17/16 0700 10/17/16 0730 10/17/16 0800  BP: (!) 105/56 114/76 136/70 (!) 109/53  Pulse: (!) 108 (!) 117 (!) 115 (!) 113  Resp: (!) 25 (!) 27 (!) 23 19  Temp:   97.5 F (36.4 C)   TempSrc:   Axillary   SpO2: 94% 96% 98% 97%  Weight:      Height:        Last BM Date: 10/17/16  Intake/Output   Yesterday:  05/14 0701 - 05/15 0700 In: 4042.5 [I.V.:3592.5; IV Piggyback:450] Out: 2390 [Urine:10] This shift:  Total I/O In: 165.3 [I.V.:165.3] Out: 0   Bowel function:  Flatus: YES  BM:  YES  Drain: (No drain)   Physical Exam:  General: Pt awake/alert/oriented x4 in moderate acute distress.  Head on hand bored, then perks up when seeing myself & the RNs.  Chatty.   Eyes: PERRL, normal EOM.  Sclera clear.  No icterus Neuro: CN II-XII intact w/o focal sensory/motor deficits. Lymph: No head/neck/groin lymphadenopathy Psych:  No delerium/psychosis/paranoia.  Calm.  Not confused.  Chatty HENT: Normocephalic, Mucus membranes moist.  No thrush.  ETT in place Neck: Supple, No tracheal deviation Chest: Dec BS at bases.  No chest wall pain w good excursion CV:  Pulses intact.  Regular rhythm MS: Normal AROM mjr joints.  No obvious deformity  Abdomen: Soft.  Moderately distended. Chronic swelling consistent with chronic ascites  Mild discomfort to deep palpation  Mod pannicular edema.  Incisions c/d/I.  DRY.    Old inguinal & LLQ hernia sacs B groins stable.  R groin smooth & large.  Soft.  No fluctuance/warmth.  LLQ SQ seroma fluid soft.   Ext:   Pitting edema to knees.  No cyanosis Skin: No petechiae / purpura  Results:   Labs: Results for orders placed or performed during the hospital encounter of 10/20/2016 (from the past 48 hour(s))  Glucose, capillary     Status: Abnormal   Collection Time: 10/15/16 11:47 AM  Result Value Ref Range   Glucose-Capillary 204 (H) 65 - 99 mg/dL   Comment 1 Capillary Specimen   Heparin level (unfractionated)     Status: None   Collection Time: 10/15/16  1:58 PM  Result Value Ref Range   Heparin Unfractionated 0.46 0.30 - 0.70 IU/mL    Comment:        IF HEPARIN RESULTS ARE BELOW EXPECTED VALUES, AND PATIENT DOSAGE HAS BEEN CONFIRMED, SUGGEST FOLLOW UP TESTING OF ANTITHROMBIN III LEVELS.   Culture, blood (routine x 2)     Status: None (Preliminary result)   Collection Time: 10/15/16  4:15 PM  Result Value Ref Range   Specimen Description BLOOD LEFT ANTECUBITAL    Special Requests      BOTTLES DRAWN AEROBIC ONLY Blood Culture adequate volume   Culture NO  GROWTH < 24 HOURS    Report Status PENDING   Glucose, capillary     Status: Abnormal   Collection Time: 10/15/16  4:23 PM  Result Value Ref Range   Glucose-Capillary 218 (H) 65 - 99 mg/dL  Culture, blood (routine x 2)     Status: None (Preliminary result)   Collection Time: 10/15/16  4:40 PM  Result Value Ref Range   Specimen Description BLOOD LEFT ANTECUBITAL    Special Requests      BOTTLES DRAWN AEROBIC ONLY Blood Culture adequate volume   Culture NO GROWTH < 24 HOURS    Report Status PENDING   Glucose, capillary     Status: Abnormal   Collection Time: 10/15/16  8:00 PM  Result Value Ref Range   Glucose-Capillary 191 (H) 65 - 99 mg/dL   Comment 1 Capillary Specimen   Glucose, capillary     Status: Abnormal   Collection Time: 10/16/16 12:08 AM  Result Value Ref Range   Glucose-Capillary 194 (H) 65 - 99 mg/dL  Glucose, capillary     Status: Abnormal   Collection Time: 10/16/16  4:01 AM  Result Value Ref Range    Glucose-Capillary 205 (H) 65 - 99 mg/dL  CBC     Status: Abnormal   Collection Time: 10/16/16  4:52 AM  Result Value Ref Range   WBC 55.0 (HH) 4.0 - 10.5 K/uL    Comment: WHITE COUNT CONFIRMED ON SMEAR REPEATED TO VERIFY CRITICAL RESULT CALLED TO, READ BACK BY AND VERIFIED WITH: Jethro Bolus [id number] WILDERK    RBC 3.79 (L) 3.87 - 5.11 MIL/uL   Hemoglobin 10.5 (L) 12.0 - 15.0 g/dL   HCT 28.5 (L) 36.0 - 46.0 %   MCV 75.2 (L) 78.0 - 100.0 fL   MCH 27.7 26.0 - 34.0 pg   MCHC 36.8 (H) 30.0 - 36.0 g/dL   RDW 17.3 (H) 11.5 - 15.5 %   Platelets 171 150 - 400 K/uL    Comment: REPEATED TO VERIFY PLATELET COUNT CONFIRMED BY SMEAR   Magnesium     Status: None   Collection Time: 10/16/16  4:52 AM  Result Value Ref Range   Magnesium 1.9 1.7 - 2.4 mg/dL  Heparin level (unfractionated)     Status: None   Collection Time: 10/16/16  4:52 AM  Result Value Ref Range   Heparin Unfractionated 0.63 0.30 - 0.70 IU/mL    Comment:        IF HEPARIN RESULTS ARE BELOW EXPECTED VALUES, AND PATIENT DOSAGE HAS BEEN CONFIRMED, SUGGEST FOLLOW UP TESTING OF ANTITHROMBIN III LEVELS.   Renal function panel     Status: Abnormal   Collection Time: 10/16/16  4:52 AM  Result Value Ref Range   Sodium 122 (L) 135 - 145 mmol/L   Potassium 4.2 3.5 - 5.1 mmol/L   Chloride 87 (L) 101 - 111 mmol/L   CO2 20 (L) 22 - 32 mmol/L   Glucose, Bld 211 (H) 65 - 99 mg/dL   BUN 115 (H) 6 - 20 mg/dL   Creatinine, Ser 3.03 (H) 0.44 - 1.00 mg/dL   Calcium 8.1 (L) 8.9 - 10.3 mg/dL   Phosphorus 5.3 (H) 2.5 - 4.6 mg/dL   Albumin 1.9 (L) 3.5 - 5.0 g/dL   GFR calc non Af Amer 15 (L) >60 mL/min   GFR calc Af Amer 17 (L) >60 mL/min    Comment: (NOTE) The eGFR has been calculated using the CKD EPI equation. This calculation has not been validated  in all clinical situations. eGFR's persistently <60 mL/min signify possible Chronic Kidney Disease.    Anion gap 15 5 - 15  Cooxemetry Panel (carboxy, met, total hgb, O2 sat)      Status: Abnormal   Collection Time: 10/16/16  5:05 AM  Result Value Ref Range   Total hemoglobin 10.6 (L) 12.0 - 16.0 g/dL   O2 Saturation 63.4 %   Carboxyhemoglobin 1.3 0.5 - 1.5 %   Methemoglobin 1.0 0.0 - 1.5 %  Glucose, capillary     Status: Abnormal   Collection Time: 10/16/16  8:39 AM  Result Value Ref Range   Glucose-Capillary 185 (H) 65 - 99 mg/dL   Comment 1 Notify RN   Lactic acid, plasma     Status: Abnormal   Collection Time: 10/16/16 11:12 AM  Result Value Ref Range   Lactic Acid, Venous 2.0 (HH) 0.5 - 1.9 mmol/L    Comment: CRITICAL RESULT CALLED TO, READ BACK BY AND VERIFIED WITH: J.COBLE,RN 10/16/16 1154 BY BSLADE   Glucose, capillary     Status: Abnormal   Collection Time: 10/16/16 11:57 AM  Result Value Ref Range   Glucose-Capillary 183 (H) 65 - 99 mg/dL   Comment 1 Notify RN   Heparin level (unfractionated)     Status: Abnormal   Collection Time: 10/16/16  2:55 PM  Result Value Ref Range   Heparin Unfractionated 0.80 (H) 0.30 - 0.70 IU/mL    Comment:        IF HEPARIN RESULTS ARE BELOW EXPECTED VALUES, AND PATIENT DOSAGE HAS BEEN CONFIRMED, SUGGEST FOLLOW UP TESTING OF ANTITHROMBIN III LEVELS.   Lactic acid, plasma     Status: None   Collection Time: 10/16/16  2:55 PM  Result Value Ref Range   Lactic Acid, Venous 1.9 0.5 - 1.9 mmol/L  Glucose, capillary     Status: Abnormal   Collection Time: 10/16/16  4:48 PM  Result Value Ref Range   Glucose-Capillary <10 (LL) 65 - 99 mg/dL   Comment 1 Notify RN   Troponin I     Status: Abnormal   Collection Time: 10/16/16  5:11 PM  Result Value Ref Range   Troponin I 0.58 (HH) <0.03 ng/mL    Comment: CRITICAL RESULT CALLED TO, READ BACK BY AND VERIFIED WITH: A SNOW,RN 1821 10/16/2016 WBOND   Renal function panel (daily at 1600)     Status: Abnormal   Collection Time: 10/16/16  5:11 PM  Result Value Ref Range   Sodium 119 (LL) 135 - 145 mmol/L    Comment: CRITICAL RESULT CALLED TO, READ BACK BY AND VERIFIED  WITH: N SNOW,RN 1803 10/16/2016 WBOND    Potassium 4.0 3.5 - 5.1 mmol/L   Chloride 89 (L) 101 - 111 mmol/L   CO2 15 (L) 22 - 32 mmol/L   Glucose, Bld 281 (H) 65 - 99 mg/dL   BUN 123 (H) 6 - 20 mg/dL   Creatinine, Ser 3.14 (H) 0.44 - 1.00 mg/dL   Calcium 7.7 (L) 8.9 - 10.3 mg/dL   Phosphorus 5.4 (H) 2.5 - 4.6 mg/dL   Albumin 1.7 (L) 3.5 - 5.0 g/dL   GFR calc non Af Amer 14 (L) >60 mL/min   GFR calc Af Amer 17 (L) >60 mL/min    Comment: (NOTE) The eGFR has been calculated using the CKD EPI equation. This calculation has not been validated in all clinical situations. eGFR's persistently <60 mL/min signify possible Chronic Kidney Disease.    Anion gap 15 5 - 15  Glucose, capillary     Status: Abnormal   Collection Time: 10/16/16  5:15 PM  Result Value Ref Range   Glucose-Capillary 277 (H) 65 - 99 mg/dL   Comment 1 Arterial Specimen   Glucose, capillary     Status: Abnormal   Collection Time: 10/16/16  6:59 PM  Result Value Ref Range   Glucose-Capillary 217 (H) 65 - 99 mg/dL   Comment 1 Arterial Specimen   Glucose, capillary     Status: Abnormal   Collection Time: 10/16/16  8:25 PM  Result Value Ref Range   Glucose-Capillary 216 (H) 65 - 99 mg/dL   Comment 1 Arterial Specimen   Heparin level (unfractionated)     Status: None   Collection Time: 10/16/16 11:32 PM  Result Value Ref Range   Heparin Unfractionated 0.63 0.30 - 0.70 IU/mL    Comment:        IF HEPARIN RESULTS ARE BELOW EXPECTED VALUES, AND PATIENT DOSAGE HAS BEEN CONFIRMED, SUGGEST FOLLOW UP TESTING OF ANTITHROMBIN III LEVELS.   Glucose, capillary     Status: Abnormal   Collection Time: 10/16/16 11:36 PM  Result Value Ref Range   Glucose-Capillary 181 (H) 65 - 99 mg/dL   Comment 1 Arterial Specimen   Glucose, capillary     Status: Abnormal   Collection Time: 10/17/16  3:52 AM  Result Value Ref Range   Glucose-Capillary 172 (H) 65 - 99 mg/dL   Comment 1 Venous Specimen   Cooxemetry Panel (carboxy, met,  total hgb, O2 sat)     Status: Abnormal   Collection Time: 10/17/16  4:01 AM  Result Value Ref Range   Total hemoglobin 9.7 (L) 12.0 - 16.0 g/dL   O2 Saturation 46.1 %   Carboxyhemoglobin 1.4 0.5 - 1.5 %   Methemoglobin 0.9 0.0 - 1.5 %  Renal function panel     Status: Abnormal   Collection Time: 10/17/16  4:03 AM  Result Value Ref Range   Sodium 129 (L) 135 - 145 mmol/L    Comment: DELTA CHECK NOTED   Potassium 4.1 3.5 - 5.1 mmol/L   Chloride 96 (L) 101 - 111 mmol/L   CO2 21 (L) 22 - 32 mmol/L   Glucose, Bld 171 (H) 65 - 99 mg/dL   BUN 81 (H) 6 - 20 mg/dL   Creatinine, Ser 2.26 (H) 0.44 - 1.00 mg/dL   Calcium 7.9 (L) 8.9 - 10.3 mg/dL   Phosphorus 4.0 2.5 - 4.6 mg/dL   Albumin 1.9 (L) 3.5 - 5.0 g/dL   GFR calc non Af Amer 21 (L) >60 mL/min   GFR calc Af Amer 25 (L) >60 mL/min    Comment: (NOTE) The eGFR has been calculated using the CKD EPI equation. This calculation has not been validated in all clinical situations. eGFR's persistently <60 mL/min signify possible Chronic Kidney Disease.    Anion gap 12 5 - 15  Magnesium     Status: None   Collection Time: 10/17/16  4:03 AM  Result Value Ref Range   Magnesium 2.0 1.7 - 2.4 mg/dL  Heparin level (unfractionated)     Status: None   Collection Time: 10/17/16  4:04 AM  Result Value Ref Range   Heparin Unfractionated 0.69 0.30 - 0.70 IU/mL    Comment:        IF HEPARIN RESULTS ARE BELOW EXPECTED VALUES, AND PATIENT DOSAGE HAS BEEN CONFIRMED, SUGGEST FOLLOW UP TESTING OF ANTITHROMBIN III LEVELS.   CBC     Status: Abnormal  Collection Time: 10/17/16  4:05 AM  Result Value Ref Range   WBC 58.9 (HH) 4.0 - 10.5 K/uL    Comment: REPEATED TO VERIFY WHITE COUNT CONFIRMED ON SMEAR CRITICAL RESULT CALLED TO, READ BACK BY AND VERIFIED WITH: C.MOSELEY,RN 6950 10/17/16 M.CAMPBELL    RBC 3.50 (L) 3.87 - 5.11 MIL/uL   Hemoglobin 9.4 (L) 12.0 - 15.0 g/dL   HCT 26.0 (L) 36.0 - 46.0 %   MCV 74.3 (L) 78.0 - 100.0 fL   MCH 26.9 26.0  - 34.0 pg   MCHC 36.2 (H) 30.0 - 36.0 g/dL   RDW 17.0 (H) 11.5 - 15.5 %   Platelets 150 150 - 400 K/uL    Comment: PLATELET COUNT CONFIRMED BY SMEAR  Renal function panel (daily at 0500)     Status: Abnormal   Collection Time: 10/17/16  7:01 AM  Result Value Ref Range   Sodium 128 (L) 135 - 145 mmol/L   Potassium 3.9 3.5 - 5.1 mmol/L   Chloride 97 (L) 101 - 111 mmol/L   CO2 19 (L) 22 - 32 mmol/L   Glucose, Bld 156 (H) 65 - 99 mg/dL   BUN 74 (H) 6 - 20 mg/dL   Creatinine, Ser 2.05 (H) 0.44 - 1.00 mg/dL   Calcium 7.7 (L) 8.9 - 10.3 mg/dL   Phosphorus 3.7 2.5 - 4.6 mg/dL   Albumin 1.9 (L) 3.5 - 5.0 g/dL   GFR calc non Af Amer 24 (L) >60 mL/min   GFR calc Af Amer 28 (L) >60 mL/min    Comment: (NOTE) The eGFR has been calculated using the CKD EPI equation. This calculation has not been validated in all clinical situations. eGFR's persistently <60 mL/min signify possible Chronic Kidney Disease.    Anion gap 12 5 - 15    Imaging / Studies: Dg Chest Port 1 View  Result Date: 10/17/2016 CLINICAL DATA:  Acute respiratory failure. EXAM: PORTABLE CHEST 1 VIEW COMPARISON:  10/16/2016 FINDINGS: The patient is significantly rotated to the left which limits assessment. The cardiomediastinal silhouette is grossly unchanged within this limitation. A single lead ICD remains in place. Bilateral jugular catheters remain in place. Bibasilar lung opacities do not appear significantly changed and may reflect a combination of pleural effusion and atelectasis or consolidation. No pneumothorax is identified. IMPRESSION: Unchanged appearance of the chest allowing for patient rotation. Persistent bilateral pleural effusions and bibasilar atelectasis or consolidation. Electronically Signed   By: Logan Bores M.D.   On: 10/17/2016 07:29   Dg Chest Port 1 View  Result Date: 10/16/2016 CLINICAL DATA:  LEFT central line placement EXAM: PORTABLE CHEST 1 VIEW COMPARISON:  10/16/2016 FINDINGS: Stable enlarged cardiac  silhouette. Interval placement of a LEFT central venous line with tip overlying the distal SVC. No change in RIGHT central line. No pneumothorax. Stable bilateral pleural effusions. IMPRESSION: No pneumothorax following central line placement. Tip projects over the distal SVC. Electronically Signed   By: Suzy Bouchard M.D.   On: 10/16/2016 15:05   Dg Chest Port 1 View  Result Date: 10/16/2016 CLINICAL DATA:  67 year old female respiratory failure. Hypertension. Subsequent encounter. EXAM: PORTABLE CHEST 1 VIEW COMPARISON:  10/15/2016. FINDINGS: AICD enters from the left with tip unchanged in position. Cardiomegaly. Right central line tip distal superior vena cava/ cavoatrial junction level unchanged. No pneumothorax. Bibasilar consolidation may represent pleural fluid layering posteriorly with atelectasis. Cannot exclude basilar infiltrate or mass. When compared to prior examination, interval shifting of right-sided pleural effusion. Central pulmonary vascular prominence. IMPRESSION: Shifting  of right-sided pleural effusion. Consolidation lung bases may represent pleural fluid with associated basilar atelectasis although difficult to exclude basilar infiltrate or mass. Cardiomegaly with AICD in place. Central pulmonary vascular prominence. Electronically Signed   By: Genia Del M.D.   On: 10/16/2016 07:51    Medications / Allergies: per chart  Antibiotics: Anti-infectives    Start     Dose/Rate Route Frequency Ordered Stop   Nov 02, 2016 1100  vancomycin (VANCOCIN) IVPB 1000 mg/200 mL premix  Status:  Discontinued     1,000 mg 200 mL/hr over 60 Minutes Intravenous Every 48 hours 10/16/16 1030 10/16/16 1435   10/17/16 1100  vancomycin (VANCOCIN) 500 mg in sodium chloride 0.9 % 100 mL IVPB     500 mg 100 mL/hr over 60 Minutes Intravenous Every 24 hours 10/16/16 1435     10/16/16 1800  piperacillin-tazobactam (ZOSYN) IVPB 3.375 g     3.375 g 12.5 mL/hr over 240 Minutes Intravenous Every 6 hours  10/16/16 1435     10/16/16 1100  vancomycin (VANCOCIN) 1,250 mg in sodium chloride 0.9 % 250 mL IVPB     1,250 mg 166.7 mL/hr over 90 Minutes Intravenous  Once 10/16/16 1025 10/16/16 1239   10/16/16 1100  piperacillin-tazobactam (ZOSYN) IVPB 2.25 g  Status:  Discontinued     2.25 g 100 mL/hr over 30 Minutes Intravenous Every 8 hours 10/16/16 1025 10/16/16 1435   10/13/16 0000  vancomycin (VANCOCIN) IVPB 750 mg/150 ml premix  Status:  Discontinued     750 mg 150 mL/hr over 60 Minutes Intravenous Every 48 hours 10/03/2016 1512 10/12/16 1229   10/12/16 1300  cefTRIAXone (ROCEPHIN) 2 g in dextrose 5 % 50 mL IVPB  Status:  Discontinued     2 g 100 mL/hr over 30 Minutes Intravenous Every 24 hours 10/12/16 1245 10/16/16 1006   10/22/2016 1515  clindamycin (CLEOCIN) IVPB 600 mg  Status:  Discontinued     600 mg 100 mL/hr over 30 Minutes Intravenous Every 8 hours 10/31/2016 1504 10/12/16 1229   10/24/2016 1515  piperacillin-tazobactam (ZOSYN) IVPB 2.25 g  Status:  Discontinued     2.25 g 100 mL/hr over 30 Minutes Intravenous Every 8 hours 10/19/2016 1512 10/12/16 1229   10/26/2016 1515  vancomycin (VANCOCIN) 1,250 mg in sodium chloride 0.9 % 250 mL IVPB     1,250 mg 166.7 mL/hr over 90 Minutes Intravenous  Once 10/17/2016 1512 10/14/2016 1836        Note: Portions of this report may have been transcribed using voice recognition software. Every effort was made to ensure accuracy; however, inadvertent computerized transcription errors may be present.   Any transcriptional errors that result from this process are unintentional.     Adin Hector, M.D., F.A.C.S. Gastrointestinal and Minimally Invasive Surgery [facility] [address], [place], [place] [address] [phone] Main / Paging   10/17/2016

## 2016-10-17 NOTE — Progress Notes (Signed)
PULMONARY / CRITICAL CARE MEDICINE   Name: Sheri Dunn MRN: [id number] DOB: Apr 04, 1951    ADMISSION DATE:  Nov 01, 2016 CONSULTATION DATE:  01-Nov-2016  REFERRING MD:  Dr. 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   Presents 5/9 with mental status changes, weakness, and hypotension. This morning patient reported lightheadedness and loss of vision. Reports that ever since surgery has had problems with hypotension and hypoglycemia. EKG revealed inferior ST elevation with reciprocal changes. Code STEMI activated. Cardiology consulted. Patient taken to Cath-Lab and given 3,000u of unfractionated heparin, it was then noted that the bulging of hernia had increased in size, with concern for internal bleed cath was held and patient taken back to ED. CT A/P revealed extraperitoneal fluid collection tracking deep to the rectus sheath, questioning seroma/hematoma not excluding infection. Surgery consulted. In ED patient had progressive hypotension, was given 1L bolus NS without improvement, requiring levophed gtt. PCCM asked to admit.   SUBJECTIVE:  Started on CRRT after multiple discussions with family yesterday. Critically ill on multiple pressors and milrinone Complaints of feeling cold.   VITAL SIGNS: BP (!) 105/56   Pulse (!) 108   Temp 97.6 F (36.4 C) (Axillary)   Resp (!) 25   Ht 5\' 6"  (1.676 m)   Wt 156 lb 15.5 oz (71.2 kg)   SpO2 94%   BMI 25.34 kg/m   HEMODYNAMICS: CVP:  [3 mmHg-5 mmHg] 5 mmHg  VENTILATOR SETTINGS:    INTAKE / OUTPUT: I/O last 3 completed shifts: In: 4013.4 [P.O.:120; I.V.:3493.4; IV Piggyback:400] Out: 236 [Urine:135; Other:101]  PHYSICAL EXAMINATION: Gen:      Weak, frail. Mild distress HEENT:  EOMI, sclera anicteric Neck:     No masses;  no thyromegaly Lungs:    Clear to auscultation bilaterally; mild tachypnea CV:         Regular rate and rhythm; no murmurs Abd:      + bowel sounds; soft, non-tender; no palpable masses, no distension Ext:    No edema; adequate peripheral perfusion Skin:      Warm and dry; no rash Neuro: No focal deficits  LABS:  BMET  Recent Labs Lab 10/16/16 0452 10/16/16 1711 10/17/16 0403  NA 122* 119* 129*  K 4.2 4.0 4.1  CL 87* 89* 96*  CO2 20* 15* 21*  BUN 115* 123* 81*  CREATININE 3.03* 3.14* 2.26*  GLUCOSE 211* 281* 171*    Electrolytes  Recent Labs Lab 10/15/16 0435 10/16/16 0452 10/16/16 1711 10/17/16 0403  CALCIUM 7.9* 8.1* 7.7* 7.9*  MG 1.8 1.9  --  2.0  PHOS 5.1* 5.3* 5.4* 4.0    CBC  Recent Labs Lab 10/15/16 0435 10/16/16 0452 10/17/16 0405  WBC 34.5* 55.0* 58.9*  HGB 10.4* 10.5* 9.4*  HCT 28.7* 28.5* 26.0*  PLT 195 171 150    Coag's  Recent Labs Lab Nov 01, 2016 1237  APTT 91*  INR 1.80    Sepsis Markers  Recent Labs Lab 01-Nov-2016 1417  11/01/16 1924 10/12/16 0454 10/13/16 0130 10/16/16 1112 10/16/16 1455  LATICACIDVEN  --   < > 2.7*  --   --  2.0* 1.9  PROCALCITON 37.31  --   --  32.02 27.38  --   --   < > = values in this interval not displayed.  ABG No results for input(s): PHART, PCO2ART, PO2ART in the last 168 hours.  Liver Enzymes  Recent Labs Lab 06/13/16 1237 10/13/16 0130  10/16/16 0452 10/16/16 1711 10/17/16 0403  AST 31 25  --   --   --   --   ALT 14 14  --   --   --   --   ALKPHOS 62 52  --   --   --   --   BILITOT 2.4* 2.2*  --   --   --   --   ALBUMIN 2.5* 2.2*  < > 1.9* 1.7* 1.9*  < > = values in this interval not displayed.  Cardiac Enzymes  Recent Labs Lab 10/13/16 1024 10/13/16 1629 10/16/16 1711  TROPONINI 0.23* 0.20* 0.58*    Glucose  Recent Labs Lab 10/16/16 1648 10/16/16 1715 10/16/16 1859 10/16/16 2025 10/16/16 2336 10/17/16 0352  GLUCAP <10* 277* 217* 216* 181* 172*    Imaging Dg  Chest Port 1 View  Result Date: 10/16/2016 CLINICAL DATA:  LEFT central line placement EXAM: PORTABLE CHEST 1 VIEW COMPARISON:  10/16/2016 FINDINGS: Stable enlarged cardiac silhouette. Interval placement of a LEFT central venous line with tip overlying the distal SVC. No change in RIGHT central line. No pneumothorax. Stable bilateral pleural effusions. IMPRESSION: No pneumothorax following central line placement. Tip projects over the distal SVC. Electronically Signed   By: Genevive BiStewart  Edmunds M.D.   On: 10/16/2016 15:05   Dg Chest Port 1 View  Result Date: 10/16/2016 CLINICAL DATA:  66 year old female respiratory failure. Hypertension. Subsequent encounter. EXAM: PORTABLE CHEST 1 VIEW COMPARISON:  10/15/2016. FINDINGS: AICD enters from the left with tip unchanged in position. Cardiomegaly. Right central line tip distal superior vena cava/ cavoatrial junction level unchanged. No pneumothorax. Bibasilar consolidation may represent pleural fluid layering posteriorly with atelectasis. Cannot exclude basilar infiltrate or mass. When compared to prior examination, interval shifting of right-sided pleural effusion. Central pulmonary vascular prominence. IMPRESSION: Shifting of right-sided pleural effusion. Consolidation lung bases may represent pleural fluid with associated basilar atelectasis although difficult to exclude basilar infiltrate or mass. Cardiomegaly with AICD in place. Central pulmonary vascular prominence. Electronically Signed   By: Lacy DuverneySteven  Olson M.D.   On: 10/16/2016 07:51     STUDIES:  CT A/P 5/9 >> Moderate Volume Ascites, interval development of an apparent extraperitoneal fluid collection tracking deep to the rectus sheath beginning just cranial to the umbilicus and tracking down towards the symphysis pubis. Seroma/Hematoma consideration, infection not excluded, fluid collection tracking along the anterolateral right lower abdominal wall appears to be extraperitoneal although, bilateral fluid  collections identified in the groin regions, moderate ascites, cardiomegaly  CXR 5/9 >>  bibasilar atelectasis, chronic marked cardiomegaly   CULTURES: UC 5/10 >> neg Blood 5/9 >> 1/2 Ecoli >> pan sensitive BC 5/13 >>  ANTIBIOTICS: Vancomycin 5/9 >> 5/10; 5/14 >> Zosyn 5/9 >> 5/10; 5/14 >> Clindamycin 5/9 >> 5/10 Rocephin 5/10 >> 5/14  SIGNIFICANT EVENTS: 5/09  Admitted as Code STEMI  5/11  Remains on levo gtt, atrial flutter overnight, cardiology consulted, diuresing / on milrinone 5/13  Remains on pressors, hypotensive, new RLL infiltrate (suspected aspiration), worsening AKI  LINES/TUBES: R IJ 5/9 >>  DISCUSSION: 66 year old female with extensive cardiac history admitted on 5/9 as Code STEMI. While in Cath-lab after being administered heparin she was noted to have increase in size of hernias, with concern for internal bleeding cath was held and patient was taken back to ED. In ED patient had  significant hypotension requiring levophed gtt. Work up found her to have Ecoli bacteremia & abdominal hematoma on CT A/P.   ASSESSMENT / PLAN:  PULMONARY A: Pulmonary Edema -  RLL airspace disease - new infiltrate vs atelectasis, suspect aspiration is infiltrate given weakness Hypoxia P:   Supplemental O2 to maintain O2 sats Aspiration precautions Bipap not indicated given the hemodynamic instability  CARDIOVASCULAR A:  STEMI Hypotension related to STEMI + E. Coli bacteremia sepsis  H/O CHF (EF 20-25), CAD S/P ICD, A.Flutter  Mild Pulm Edema PAFib/flutter P:  Continue heparin, amio and milrinone CVP is between 0-4.  Will discuss with nephrology about maintaining even Continue levophed and neo Can start vaso if needed. Continue stress dose steroids Holding home HTN meds  RENAL A:   Acute Kidney Injury 2/2 shock ( STEMI + sepsis with Ecoli bacteremia) Lactic Acidosis  P:   Nephrology following, appreciate assistance Continue CRRT. Consider changing to even  goals Replete electrolytes as needed  GASTROINTESTINAL A:   Cirrhosis  Malnutrition, moderate S/P Recent Hernia (Umbilical + Obturator) Repair with Seroma / Hematoma  P:  Keep NPO Aspiration precautions Protonix for SUP  HEMATOLOGIC A:   Leukopenia  Leukocytosis Suspected Abdominal Hematoma - increase in size of hernias during cath  Anemia P:  Follow CBC, fever curve Continue heparin per pharmacy  INFECTIOUS A:   Septic Shock 2/2 Ecoli bacteremia.  Significant increase in WBC from 15-> 34.5-> 55, ? Source new RLL inflitrate/ GI source P:   Broadened abx to vanco, zosyn yesterday Follow cultures, pct  ENDOCRINE A:   Hypoglycemia - resolved  P:   Follow glucose  NEUROLOGIC A:   Acute Encephalopathy - resolved   P:   Ongoing monitoring/ supportive care Minimize sedating meds  FAMILY  - Updates:  Updated family multiple times yesterday and today. I stressed that we are giving her max medical therapy and there is nothing more to offer at this point. Son says that they are praying for a recovery but understand that she would likely continue to decline. I told him today on 5/15 that we should not do anything more aggressive at this point and maintain the current therapies. He agrees that she should be DNR/DNI.   - Inter-disciplinary family meet or Palliative Care meeting done  The patient is critically ill with multiple organ system failure and requires high complexity decision making for assessment and support, frequent evaluation and titration of therapies, advanced monitoring, review of radiographic studies and interpretation of complex data.   Critical Care Time devoted to patient care services, exclusive of separately billable procedures, described in this note is 45  minutes.   Chilton Greathouse MD [facility] Pager [phone] If no answer or after 3pm call: [phone] 10/17/2016, 6:28 AM

## 2016-10-17 NOTE — Progress Notes (Signed)
CRITICAL VALUE ALERT  Critical value received:  WBC 58.9  Date of notification:  10/17/16  Time of notification:  0452  Critical value read back:Yes.    Nurse who received alert:  Dianna Rossettiharles Dalanie Kisner, RN  MD notified (1st page):  Dr. Christene Slatesde Dios, MD (eLink)  Time of first page:  [phone]  MD notified (2nd page):  Time of second page:  Responding MD:  Dr. Christene Slatesde Dios, MD [place](eLink)  Time MD responded:  [id number]  Herma ArdMOSELEY, Suzane Vanderweide F, RN
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
## 2016-10-17 NOTE — Progress Notes (Signed)
Subjective:  Started CRRT late yesterday-  Overnight had hypoglycemia and continued hypotension- required increase of phenylephrine- CVP has been less than 5 but sodium was 119 upon initiation- is 129 this AM Objective Vital signs in last 24 hours: Vitals:   10/17/16 0400 10/17/16 0500 10/17/16 0600 10/17/16 0700  BP: 119/65 (!) 102/58 (!) 105/56 114/76  Pulse: (!) 106 (!) 113 (!) 108 (!) 117  Resp: (!) 21 (!) 30 (!) 25 (!) 27  Temp: 97.6 F (36.4 C)     TempSrc: Axillary     SpO2: 96% 93% 94% 96%  Weight: 71.2 kg (156 lb 15.5 oz)     Height: 5\' 6"  (1.676 m)      Weight change: 8.7 kg (19 lb 2.9 oz)  Intake/Output Summary (Last 24 hours) at 10/17/16 0716 Last data filed at 10/17/16 0700  Gross per 24 hour  Intake           4042.5 ml  Output             2390 ml  Net           1652.5 ml    Assessment/ Plan: [name] with previous EF 20-25% who was admitted on 10/12/2016 with STEMI, then development of imtraabdominal hematoma/abscess and E. Coli bacteremia causing shock/hypotension and now AKI Assessment/Plan: 1. Renal- AKI secondary to hemodynamics in the setting of shock- possibly both cardiogenic and septic- essentially no UOP.  Have started CRRT as plan is still for aggressive care- however unfortunately is still not improving overall.  We will take CCM lead on this and continue CRRT for now- elytes stable sodium improved  2. Cardiac- prior EF 20-25 % - recent STEMI and no ability to evaluate coronary status.  Is requiring milrinone. Amiodarone, norepi and phenylephrine at this time- marginal BP  3. Anemia- hgb 9.4 at this time s/p transfusion 4. ID- WBC climbing- previously on rocephin- indicates infection is not being eradicated- thought to be intraabdominal source but patient not a candidate for surgery - changed to zosyn and vanc- WBC close to 59 K today  5. HTN/volume- volume overloaded- 10 liters positive with hyponatremia- not responsive to diuretics- only  avenue to remove volume is CRRT - not sure I believe low CVP with hyponatremia (would normally be indicator of volume overload)  Removed some with CRRT yest- but still ended up positive due to all drips- will back off some on UF.  Sodium correcting with CRRT  6. Dispo- I spoke to family - said that there were things that dialysis would not fix, ie her infection with WBC 59 and her heart but continuing aggressive care for now- poor prognosis   Eshaal Duby A    Labs: Basic Metabolic Panel:  Recent Labs Lab 10/16/16 0452 10/16/16 1711 10/17/16 0403  NA 122* 119* 129*  K 4.2 4.0 4.1  CL 87* 89* 96*  CO2 20* 15* 21*  GLUCOSE 211* 281* 171*  BUN 115* 123* 81*  CREATININE 3.03* 3.14* 2.26*  CALCIUM 8.1* 7.7* 7.9*  PHOS 5.3* 5.4* 4.0   Liver Function Tests:  Recent Labs Lab 10/26/2016 1237 10/13/16 0130  10/16/16 0452 10/16/16 1711 10/17/16 0403  AST 31 25  --   --   --   --   ALT 14 14  --   --   --   --   ALKPHOS 62 52  --   --   --   --   BILITOT 2.4* 2.2*  --   --   --   --  PROT 5.8* 5.5*  --   --   --   --   ALBUMIN 2.5* 2.2*  < > 1.9* 1.7* 1.9*  < > = values in this interval not displayed. No results for input(s): LIPASE, AMYLASE in the last 168 hours.  Recent Labs Lab 10/16/2016 1420  AMMONIA 34   CBC:  Recent Labs Lab 10/13/2016 1208  10/13/16 0130  10/14/16 0420 10/15/16 0435 10/16/16 0452 10/17/16 0405  WBC 3.9*  < > 8.0  --  15.0* 34.5* 55.0* 58.9*  NEUTROABS 2.7  --   --   --   --   --   --   --   HGB 9.8*  < > 7.2*  < > 8.7* 10.4* 10.5* 9.4*  HCT 29.0*  < > 21.5*  < > 25.4* 28.7* 28.5* 26.0*  MCV 81.7  < > 78.8  --  78.6 77.2* 75.2* 74.3*  PLT 311  < > 222  --  208 195 171 150  < > = values in this interval not displayed. Cardiac Enzymes:  Recent Labs Lab 10/12/16 0304 10/13/16 0442 10/13/16 1024 10/13/16 1629 10/16/16 1711  TROPONINI 0.39* 0.27* 0.23* 0.20* 0.58*   CBG:  Recent Labs Lab 10/16/16 1715 10/16/16 1859  10/16/16 2025 10/16/16 2336 10/17/16 0352  GLUCAP 277* 217* 216* 181* 172*    Iron Studies: No results for input(s): IRON, TIBC, TRANSFERRIN, FERRITIN in the last 72 hours. Studies/Results: Dg Chest Port 1 View  Result Date: 10/16/2016 CLINICAL DATA:  LEFT central line placement EXAM: PORTABLE CHEST 1 VIEW COMPARISON:  10/16/2016 FINDINGS: Stable enlarged cardiac silhouette. Interval placement of a LEFT central venous line with tip overlying the distal SVC. No change in RIGHT central line. No pneumothorax. Stable bilateral pleural effusions. IMPRESSION: No pneumothorax following central line placement. Tip projects over the distal SVC. Electronically Signed   By: Genevive Bi M.D.   On: 10/16/2016 15:05   Dg Chest Port 1 View  Result Date: 10/16/2016 CLINICAL DATA:  66 year old female respiratory failure. Hypertension. Subsequent encounter. EXAM: PORTABLE CHEST 1 VIEW COMPARISON:  10/15/2016. FINDINGS: AICD enters from the left with tip unchanged in position. Cardiomegaly. Right central line tip distal superior vena cava/ cavoatrial junction level unchanged. No pneumothorax. Bibasilar consolidation may represent pleural fluid layering posteriorly with atelectasis. Cannot exclude basilar infiltrate or mass. When compared to prior examination, interval shifting of right-sided pleural effusion. Central pulmonary vascular prominence. IMPRESSION: Shifting of right-sided pleural effusion. Consolidation lung bases may represent pleural fluid with associated basilar atelectasis although difficult to exclude basilar infiltrate or mass. Cardiomegaly with AICD in place. Central pulmonary vascular prominence. Electronically Signed   By: Lacy Duverney M.D.   On: 10/16/2016 07:51   Medications: Infusions: . sodium chloride    . sodium chloride    . amiodarone 30 mg/hr (10/17/16 0700)  . heparin 900 Units/hr (10/17/16 0700)  . milrinone 0.25 mcg/kg/min (10/17/16 0700)  . norepinephrine (LEVOPHED) Adult  infusion 40 mcg/min (10/17/16 0700)  . phenylephrine (NEO-SYNEPHRINE) Adult infusion 260 mcg/min (10/17/16 0700)  . piperacillin-tazobactam (ZOSYN)  IV 3.375 g (10/17/16 0600)  . dialysis replacement fluid (prismasate) 350 mL/hr at 10/16/16 1607  . dialysis replacement fluid (prismasate) 300 mL/hr at 10/16/16 1609  . dialysate (PRISMASATE) 1,500 mL/hr at 10/17/16 0433  . sodium chloride    . vancomycin    . vasopressin (PITRESSIN) infusion - *FOR SHOCK* Stopped (10/16/16 0456)    Scheduled Medications: . chlorhexidine  15 mL Mouth Rinse BID  . Chlorhexidine Gluconate  Cloth  6 each Topical Daily  . hydrocortisone sod succinate (SOLU-CORTEF) inj  50 mg Intravenous Q6H  . insulin aspart  0-9 Units Subcutaneous Q4H  . mouth rinse  15 mL Mouth Rinse q12n4p  . pantoprazole (PROTONIX) IV  40 mg Intravenous Q24H  . polyethylene glycol  17 g Oral BID  . senna-docusate  1 tablet Oral BID  . sodium chloride flush  10-40 mL Intracatheter Q12H    have reviewed scheduled and prn medications.  Physical Exam: General: not really arousable this AM Heart: tachy Lungs: CBS bilat- claims no SOB- on 6 liters now  Sat 99 Abdomen: firm area- she says is not tender Extremities: some peripheral edema    10/17/2016,7:16 AM  LOS: 6 days

## 2016-10-18 ENCOUNTER — Inpatient Hospital Stay (HOSPITAL_COMMUNITY): Payer: Medicare HMO

## 2016-10-18 DIAGNOSIS — N170 Acute kidney failure with tubular necrosis: Secondary | ICD-10-CM

## 2016-10-18 LAB — RENAL FUNCTION PANEL
ALBUMIN: 1.9 g/dL — AB (ref 3.5–5.0)
Anion gap: 12 (ref 5–15)
BUN: 37 mg/dL — ABNORMAL HIGH (ref 6–20)
CALCIUM: 7.7 mg/dL — AB (ref 8.9–10.3)
CO2: 21 mmol/L — ABNORMAL LOW (ref 22–32)
CREATININE: 1.36 mg/dL — AB (ref 0.44–1.00)
Chloride: 100 mmol/L — ABNORMAL LOW (ref 101–111)
GFR calc Af Amer: 46 mL/min — ABNORMAL LOW (ref >60)
GFR calc non Af Amer: 39 mL/min — ABNORMAL LOW (ref >60)
Glucose, Bld: 133 mg/dL — ABNORMAL HIGH (ref 65–99)
PHOSPHORUS: 3 mg/dL (ref 2.5–4.6)
Potassium: 4.1 mmol/L (ref 3.5–5.1)
SODIUM: 133 mmol/L — AB (ref 135–145)

## 2016-10-18 LAB — GLUCOSE, CAPILLARY
GLUCOSE-CAPILLARY: 172 mg/dL — AB (ref 65–99)
Glucose-Capillary: 162 mg/dL — ABNORMAL HIGH (ref 65–99)
Glucose-Capillary: 114 mg/dL — ABNORMAL HIGH (ref 65–99)

## 2016-10-18 LAB — COOXEMETRY PANEL
CARBOXYHEMOGLOBIN: 1 % (ref 0.5–1.5)
Methemoglobin: 1.3 % (ref 0.0–1.5)
O2 SAT: 63 %
Total hemoglobin: 9.2 g/dL — ABNORMAL LOW (ref 12.0–16.0)

## 2016-10-18 LAB — CBC
HCT: 25.4 % — ABNORMAL LOW (ref 36.0–46.0)
Hemoglobin: 9.1 g/dL — ABNORMAL LOW (ref 12.0–15.0)
MCH: 26.5 pg (ref 26.0–34.0)
MCHC: 35.8 g/dL (ref 30.0–36.0)
MCV: 74.1 fL — ABNORMAL LOW (ref 78.0–100.0)
PLATELETS: 138 10*3/uL — AB (ref 150–400)
RBC: 3.43 MIL/uL — ABNORMAL LOW (ref 3.87–5.11)
RDW: 17.5 % — AB (ref 11.5–15.5)
WBC: 73.1 10*3/uL (ref 4.0–10.5)

## 2016-10-18 LAB — HEPARIN LEVEL (UNFRACTIONATED): HEPARIN UNFRACTIONATED: 0.53 [IU]/mL (ref 0.30–0.70)

## 2016-10-18 LAB — PROCALCITONIN: PROCALCITONIN: 7.39 ng/mL

## 2016-10-18 LAB — MAGNESIUM: MAGNESIUM: 2.2 mg/dL (ref 1.7–2.4)

## 2016-10-18 MED ORDER — MORPHINE BOLUS VIA INFUSION
5.0000 mg | INTRAVENOUS | Status: DC | PRN
  Filled 2016-10-18: qty 20

## 2016-10-18 MED ORDER — SODIUM CHLORIDE 0.9 % IV SOLN
10.0000 mg/h | INTRAVENOUS | Status: DC
  Administered 2016-10-18: 10 mg/h via INTRAVENOUS
  Filled 2016-10-18: qty 10

## 2016-10-19 NOTE — Progress Notes (Signed)
230 ml morphine wasted in sink with Allena EaringAbby Stophel, RN

## 2016-10-20 ENCOUNTER — Telehealth: Payer: Self-pay

## 2016-10-20 LAB — CULTURE, BLOOD (ROUTINE X 2)
Culture: NO GROWTH
Culture: NO GROWTH
SPECIAL REQUESTS: ADEQUATE
SPECIAL REQUESTS: ADEQUATE

## 2016-10-20 NOTE — Telephone Encounter (Signed)
On 10/20/16 I received a death certificate [facility] ([facility] The death certificate is for burial. The patient is a patient of Doctor Maneem. The death certificate will be taken to Pulmonary Office @ [facility] this pm for signature.  On 10/23/16 I received the death certificate back from Doctor Maneem. I got the death certificate ready and called the funeral home to let them know the death certificate is ready for pickup.

## 2016-11-02 NOTE — Discharge Summary (Signed)
Physician Discharge Summary  Patient ID: Sheri CurlsCarolyn Dunn MRN: [id number] DOB/AGE: Jul 17, 1950 66 y.o.  Admit date: 10/29/2016 Discharge date: 11/02/2016  Admission Diagnoses:  Discharge Diagnoses:  Principal Problem:   Cardiogenic shock [facility](HCC) Active Problems:   Elevated troponin   CAD S/P PCI April 2016   Aortic insufficiency   Ischemic cardiomyopathy   Chronic combined systolic and diastolic CHF (congestive heart failure) (HCC)   ICD (implantable cardioverter-defibrillator) in place   Hypertensive heart disease   Bilateral inguinal hernias s/p lap repair with mesh 09/28/2016   Obturator hernias, bilateral, s/p lap repair with mesh 09/28/2016   Umbilical hernia s/p lap repair with mesh 09/28/2016   Constipation, chronic   Cirrhosis, nonalcoholic   Poorly controlled ascites   Acute ST elevation myocardial infarction (STEMI) of inferior wall (HCC)   BLQ abdominal wall old hernia sac seromas - LARGE   Septic shock (HCC)   Pressure injury of skin   Abdominal pain   Acute pulmonary edema (HCC)   AKI (acute kidney injury) (HCC)   ARF (acute renal failure) (HCC)   Acute respiratory failure with hypoxia (HCC)   PAF (paroxysmal atrial fibrillation) (HCC)   Discharged Condition: Deceased  Hospital Course: 66 year old female with PMH of Combined CHF (EF 20-25%) s/p [employer] ICD (2016), CAD s/p NSTEMI and PCI, Dyspnea, HTN, A.Flutter, and recent admission 4/26 for extensive hernia repair at 4 different sites.   Presents 5/9 with mental status changes, weakness, and hypotension. This morning patient reported lightheadedness and loss of vision. Reports that ever since surgery has had problems with hypotension and hypoglycemia. EKG revealed inferior ST elevation with reciprocal changes. Code STEMI activated. Cardiology consulted. Patient taken to Cath-Lab and given 3,000u of unfractionated heparin, it was then noted that the bulging of hernia had increased in size, with concern for internal  bleed cath was held and patient taken back to ED. CT A/P revealed extraperitoneal fluid collection tracking deep to the rectus sheath, questioning seroma/hematoma not excluding infection. Surgery consulted. In ED patient had progressive hypotension, was given 1L bolus NS without improvement, requiring levophed gtt. PCCM asked to admit.   Hospital course was complicated by septic shock from E coli bacteremia, progressive renal failure requiring CVHH. She continued to deteriorate requiring multiple pressors and WBc count increased to 73. After multiple discussions with the family the decision was made to transition to comfort care.   Consults: cardiology and general surgery  Disposition: 20-Expired   Allergies as of 10/19/2016      Reactions   No Known Allergies       Medication List    ASK your doctor about these medications   aspirin 81 MG tablet Take 81 mg by mouth daily.   atorvastatin 20 MG tablet Commonly known as:  LIPITOR Take 1 tablet (20 mg total) by mouth daily.   carvedilol 6.25 MG tablet Commonly known as:  COREG Take 1 tablet (6.25 mg total) by mouth 2 (two) times daily with a meal.   furosemide 40 MG tablet Commonly known as:  LASIX take 2 tablets (80 MG) by mouth once daily WHEN YOUR WEIGHT REACHES 117 POUNDS REDUCE BACK TO 1 TABLET (40MG ) EVERY DAY   metolazone 2.5 MG tablet Commonly known as:  ZAROXOLYN Take 1 tablet (2.5 mg total) by mouth 3 (three) times a week. 30 minutes before taking Lasix on Mondays, Wednesdays, and Fridays.   oxyCODONE 5 MG immediate release tablet Commonly known as:  Oxy IR/ROXICODONE Take 1-2 tablets (5-10 mg total) by  mouth every 6 (six) hours as needed for moderate pain, severe pain or breakthrough pain.   potassium chloride 10 MEQ tablet Commonly known as:  K-DUR Take 1 tablet (10 mEq total) by mouth daily.   spironolactone 25 MG tablet Commonly known as:  ALDACTONE Take 1 tablet (25 mg total) by mouth daily.   valsartan 160  MG tablet Commonly known as:  DIOVAN take 1 tablet by mouth once daily   Vitamin D3 2000 units Tabs Take 2,000 Units by mouth daily.        Signed: Luverne Farone 11/02/2016, 4:36 AM

## 2016-11-03 NOTE — Progress Notes (Signed)
Family asked me to come back later, they will ask nurse to page when needed, as their own pastor is there now.    05/30/17 1100  Clinical Encounter Type  Visited With Patient and family together;Health care provider  Visit Type Initial;Psychological support;Spiritual support;Social support;Critical Care;Other (Comment) (end of life)  Referral From Nurse (pharmacist)  Spiritual Encounters  Spiritual Needs Emotional;Grief support  Stress Factors  Patient Stress Factors Other (Comment) (transitioning to comfo)  Family Stress Factors Health changes;Loss;Major life changes   Ephraim Hamburgerynthia A Calli Bashor, [address]

## 2016-11-03 NOTE — Progress Notes (Addendum)
ANTICOAGULATION CONSULT NOTE - Follow Up Consult  Pharmacy Consult for Heparin  Indication: atrial fibrillation, ACS  Allergies  Allergen Reactions  . No Known Allergies     Patient Measurements: Height: 5\' 6"  (167.6 cm) Weight: 139 lb 5.3 oz (63.2 kg) IBW/kg (Calculated) : 59.3  Vital Signs: Temp: 97.4 F (36.3 C) (05/15 2330) Temp Source: Oral (05/15 2330) BP: 121/55 (05/16 0500) Pulse Rate: 100 (05/16 0515)  Labs:  Recent Labs  10/16/16 0452  10/16/16 1711  10/17/16 0404 10/17/16 0405 10/17/16 0701 10/17/16 1614 10/05/2016 0432 10/26/2016 0433  HGB 10.5*  --   --   --   --  9.4*  --   --  9.1*  --   HCT 28.5*  --   --   --   --  26.0*  --   --  25.4*  --   PLT 171  --   --   --   --  150  --   --  138*  --   HEPARINUNFRC 0.63  < >  --   < > 0.69  --   --  0.62  --  0.53  CREATININE 3.03*  --  3.14*  < >  --   --  2.05* 1.62* 1.36*  --   TROPONINI  --   --  0.58*  --   --   --   --   --   --   --   < > = values in this interval not displayed.  Estimated Creatinine Clearance: 37.6 mL/min (A) (by C-G formula based on SCr of 1.36 mg/dL (H)).    Assessment: 66 y/o F on heparin for afib and ACS, heparin level therapeutic at 0.53 at 900 units/hr. -Hgb 9.4>9.1, Plts down 171>150>138  Goal of Therapy:  Heparin level 0.3-0.7 units/ml Monitor platelets by anticoagulation protocol: Yes   Plan:  -Continue heparin at 900 units/hr -Daily CBC/HL -Monitor S/Sx bleeding  Gwyndolyn KaufmanKai Ashia Dehner Bernette Redbird(Kenny), PharmD  PGY1 Pharmacy Resident Pager: [phone] 10/17/2016 6:39 AM

## 2016-11-03 NOTE — Progress Notes (Signed)
Nutrition Brief Note  Chart reviewed. Family plans to transition to comfort care.  Pt is currently NPO no further nutrition interventions warranted at this time.  Please re-consult as needed.   Kendell BaneHeather Sian Rockers RD, LDN, CNSC [phone] Pager [phone] After Hours Pager

## 2016-11-03 NOTE — Progress Notes (Signed)
ICD tachy detection and therapies turned off. Backup pacing reduced to minimum. Thurmon FairMihai Betsie Peckman, MD, [facility] [phone] office [phone] pager

## 2016-11-03 NOTE — Progress Notes (Signed)
   10/10/2016 1900  Clinical Encounter Type  Visited With Patient and family together  Visit Type Initial;Follow-up;Psychological support;Spiritual support;Social support  Referral From Family;Nurse  Consult/Referral To Faith community  Spiritual Encounters  Spiritual Needs Prayer;Emotional;Grief support  Stress Factors  Patient Stress Factors None identified  Family Stress Factors Loss   CH paged by nurse for end-of-life support. Family was visited by pastor earlier but asked if [facility] for a [facility]. Pt was non-responsive when CH arrived, several family members at bedside. Pt had a relaxed facial expression. Family was actively grieving. CH provided spiritual support via prayer.  Family expressed appreciation for the visit.

## 2016-11-03 NOTE — Progress Notes (Signed)
Stopped back by to check w/ several family members still in rm. Their pastor had prayed with them, and had left, and they wish a chaplain to pray w/ them after last family member arrives sometime after 1500. They'll ask nurse to page when that occurs.    01/27/2017 1400  Clinical Encounter Type  Visited With Patient and family together  Visit Type Follow-up  Referral From Chaplain  Spiritual Encounters  Spiritual Needs Emotional;Grief support  Stress Factors  Patient Stress Factors Other (Comment) (end of life)  Family Stress Factors Family relationships;Health changes;Loss;Loss of control   Sheri Dunn, [address]

## 2016-11-03 NOTE — Progress Notes (Signed)
Advanced Heart Failure Rounding Note   Subjective:     Combination cardiogenic/septic shock requiring pressors. On vancomycin and zosyn. WBC trending up to 73 K.  Platelets trending down. Continues on CRRT not able to pull.  Oxygen requirements increased with desaturations in the 70s.   Today she remains on neo 90 , norepi 38 mcg, and amio 30 mg.  CVP 5.   Awake and can talk but voice very soft. O2 requirements are increasing.    Objective:   Weight Range:  Vital Signs:   Temp:  [97.2 F (36.2 C)-97.7 F (36.5 C)] 97.4 F (36.3 C) (05/15 2330) Pulse Rate:  [86-120] 108 (05/16 0700) Resp:  [17-31] 17 (05/16 0700) BP: (75-153)/(48-70) 108/56 (05/16 0700) SpO2:  [79 %-100 %] 100 % (05/16 0700) Arterial Line BP: (89-142)/(44-59) 115/52 (05/16 0700) Weight:  [139 lb 5.3 oz (63.2 kg)] 139 lb 5.3 oz (63.2 kg) (05/16 0600) Last BM Date: 10/17/16  Weight change: Filed Weights   10/16/16 0445 10/17/16 0400 10/11/2016 0600  Weight: 137 lb 12.6 oz (62.5 kg) 156 lb 15.5 oz (71.2 kg) 139 lb 5.3 oz (63.2 kg)    Intake/Output:   Intake/Output Summary (Last 24 hours) at 10/12/2016 0729 Last data filed at 10/06/2016 0700  Gross per 24 hour  Intake          3134.63 ml  Output             3405 ml  Net          -270.37 ml     Physical Exam: CVP 5 General:  Appears acutely ill. Bear Hugger in place HEENT: normal except for dentition Neck: supple. JVP flat . Carotids 2+ bilat; no bruits. No lymphadenopathy or thryomegaly appreciated. RIJ and LIJ  Cor: PMI nondisplaced. Irregular rate & rhythm. No rubs, gallops or murmurs. Lungs: Decreased in the based.  On high flow 9 lliters Abdomen: soft, nontender, nondistended. No hepatosplenomegaly. No bruits or masses. Good bowel sounds. Extremities: no cyanosis, clubbing, rash, R and LLE 2+ edema Neuro: Drowsy. Follows commands. Moves all 4 extremities w/o difficulty. Flat pleasant  Telemetry: A fib 100s.   Labs: Basic Metabolic  Panel:  Recent Labs Lab 10/14/16 0420 10/15/16 0435 10/16/16 0452 10/16/16 1711 10/17/16 0403 10/17/16 0701 10/17/16 1614 10/10/2016 0432  NA 125* 124* 122* 119* 129* 128* 132* 133*  K 3.7 4.0 4.2 4.0 4.1 3.9 4.1 4.1  CL 91* 89* 87* 89* 96* 97* 100* 100*  CO2 22 22 20* 15* 21* 19* 22 21*  GLUCOSE 111* 163* 211* 281* 171* 156* 131* 133*  BUN 64* 85* 115* 123* 81* 74* 53* 37*  CREATININE 2.27* 2.57* 3.03* 3.14* 2.26* 2.05* 1.62* 1.36*  CALCIUM 8.0* 7.9* 8.1* 7.7* 7.9* 7.7* 7.8* 7.7*  MG 1.8 1.8 1.9  --  2.0  --   --  2.2  PHOS 5.0* 5.1* 5.3* 5.4* 4.0 3.7 3.1 3.0    Liver Function Tests:  Recent Labs Lab 11-07-2016 1237 10/13/16 0130  10/16/16 1711 10/17/16 0403 10/17/16 0701 10/17/16 1614 10/05/2016 0432  AST 31 25  --   --   --   --   --   --   ALT 14 14  --   --   --   --   --   --   ALKPHOS 62 52  --   --   --   --   --   --   BILITOT 2.4* 2.2*  --   --   --   --   --   --  PROT 5.8* 5.5*  --   --   --   --   --   --   ALBUMIN 2.5* 2.2*  < > 1.7* 1.9* 1.9* 1.9* 1.9*  < > = values in this interval not displayed. No results for input(s): LIPASE, AMYLASE in the last 168 hours.  Recent Labs Lab 10/15/2016 1420 10/17/16 0830  AMMONIA 34 43*    CBC:  Recent Labs Lab 10/29/2016 1208  10/14/16 0420 10/15/16 0435 10/16/16 0452 10/17/16 0405 27-Dec-2016 0432  WBC 3.9*  < > 15.0* 34.5* 55.0* 58.9* 73.1*  NEUTROABS 2.7  --   --   --   --   --   --   HGB 9.8*  < > 8.7* 10.4* 10.5* 9.4* 9.1*  HCT 29.0*  < > 25.4* 28.7* 28.5* 26.0* 25.4*  MCV 81.7  < > 78.6 77.2* 75.2* 74.3* 74.1*  PLT 311  < > 208 195 171 150 138*  < > = values in this interval not displayed.  Cardiac Enzymes:  Recent Labs Lab 10/12/16 0304 10/13/16 0442 10/13/16 1024 10/13/16 1629 10/16/16 1711  TROPONINI 0.39* 0.27* 0.23* 0.20* 0.58*    BNP: BNP (last 3 results) No results for input(s): BNP in the last 8760 hours.  ProBNP (last 3 results) No results for input(s): PROBNP in the last 8760  hours.    Other results:  Imaging: Dg Chest Port 1 View  Result Date: 10/17/2016 CLINICAL DATA:  Acute respiratory failure. EXAM: PORTABLE CHEST 1 VIEW COMPARISON:  10/16/2016 FINDINGS: The patient is significantly rotated to the left which limits assessment. The cardiomediastinal silhouette is grossly unchanged within this limitation. A single lead ICD remains in place. Bilateral jugular catheters remain in place. Bibasilar lung opacities do not appear significantly changed and may reflect a combination of pleural effusion and atelectasis or consolidation. No pneumothorax is identified. IMPRESSION: Unchanged appearance of the chest allowing for patient rotation. Persistent bilateral pleural effusions and bibasilar atelectasis or consolidation. Electronically Signed   By: Sebastian AcheAllen  Grady M.D.   On: 10/17/2016 07:29   Dg Chest Port 1 View  Result Date: 10/16/2016 CLINICAL DATA:  LEFT central line placement EXAM: PORTABLE CHEST 1 VIEW COMPARISON:  10/16/2016 FINDINGS: Stable enlarged cardiac silhouette. Interval placement of a LEFT central venous line with tip overlying the distal SVC. No change in RIGHT central line. No pneumothorax. Stable bilateral pleural effusions. IMPRESSION: No pneumothorax following central line placement. Tip projects over the distal SVC. Electronically Signed   By: Genevive BiStewart  Edmunds M.D.   On: 10/16/2016 15:05      Medications:     Scheduled Medications: . chlorhexidine  15 mL Mouth Rinse BID  . Chlorhexidine Gluconate Cloth  6 each Topical Daily  . hydrocortisone sod succinate (SOLU-CORTEF) inj  50 mg Intravenous Q6H  . insulin aspart  0-9 Units Subcutaneous Q4H  . mouth rinse  15 mL Mouth Rinse q12n4p  . pantoprazole (PROTONIX) IV  40 mg Intravenous Q24H  . polyethylene glycol  17 g Oral BID  . senna-docusate  1 tablet Oral BID  . sodium chloride flush  10-40 mL Intracatheter Q12H     Infusions: . sodium chloride    . sodium chloride    . acetaminophen     . amiodarone 30 mg/hr (27-Dec-2016 0600)  . epinephrine Stopped (10/17/16 1218)  . heparin 900 Units/hr (27-Dec-2016 0600)  . norepinephrine (LEVOPHED) Adult infusion 35 mcg/min (27-Dec-2016 0600)  . phenylephrine (NEO-SYNEPHRINE) Adult infusion 90 mcg/min (27-Dec-2016 0600)  . piperacillin-tazobactam (ZOSYN)  IV 3.375 g (11-12-16 0502)  . dialysis replacement fluid (prismasate) 350 mL/hr at 10/17/16 2344  . dialysis replacement fluid (prismasate) 300 mL/hr at 11/12/2016 0458  . dialysate (PRISMASATE) 1,500 mL/hr at 2016/11/12 0454  . sodium chloride    . vancomycin Stopped (10/17/16 1153)  . vasopressin (PITRESSIN) infusion - *FOR SHOCK* Stopped (10/16/16 0456)     PRN Medications:  sodium chloride, acetaminophen, acetaminophen, acetaminophen, alum & mag hydroxide-simeth, bisacodyl, heparin, lip balm, magic mouthwash, menthol-cetylpyridinium, metoprolol tartrate, oxyCODONE, phenol, polyethylene glycol, sodium chloride, sodium chloride flush   Assessment/Plan/Discussion    1. Suspect Combination Septic/Cardiogenic Shock with multisystem organ failure Remains on neo 90 mcg and norepi 38 mcg. Todays CO-OX 63% Blood CX with EColi. On Vanc and Zosyn. WBC trending up 73K  2. ARF- CRRT unable to pull with hypotension.  3. Acute Respiratory failure- O2 sats down to the 70s on 6 liters . Placed on high flow 9 liters.  4. A fib - on amio 30 mg per hour. On Heparin. Platelets trending down.  5. CAD- DES RCA LCX 2016 6 Cirrhosis 7. S/P Hernia Repair 09/28/2016. Dr Michaell Cowing following.   Length of Stay: 7   Amy Clegg NP-C  11/12/2016, 7:29 AM  Advanced Heart Failure Team Pager [phone] (M-F; 7a - 4p)  Please contact [facility] for night-coverage after hours (4p -7a ) and weekends on [email or web address]  Agree. She remains critically ill.   Remains on high-dose pressors with multi-system organ failure. O2 requirements increasing. CXR reviewed personally (worsening edema) despite low CVP. Suggest capillary leak  syndrome.   Will continue pressors. Can escalate as needed to help try to pull some fluid with CVVHD as tolerated.   WBC continues to increase. Unclear if this is related to steroids. Continues on broad spectrum abx. Consider broadening to meropenem or adding fungal coverage.  On exam Very weak. On high flow O2 Cor IRR  Lungs + crackles Ab soft minimally tender + hernia Ext: warm with 2+ edema  Overall remains critically ill with multi-system organ failure. Given worsening respiratory status would try to pul some fluid gently with CVVHD even if it requires increasing pressors. Consider broadening abx/fungal coverage.  Ongoing discussions with family regarding GOC.   CRITICAL CARE Performed by: Arvilla Meres  Total critical care time: 35 minutes  Critical care time was exclusive of separately billable procedures and treating other patients.  Critical care was necessary to treat or prevent imminent or life-threatening deterioration.  Critical care was time spent personally by me (independent of midlevel providers or residents) on the following activities: development of treatment plan with patient and/or surrogate as well as nursing, discussions with consultants, evaluation of patient's response to treatment, examination of patient, obtaining history from patient or surrogate, ordering and performing treatments and interventions, ordering and review of laboratory studies, ordering and review of radiographic studies, pulse oximetry and re-evaluation of patient's condition.  Arvilla Meres, MD  10:19 AM

## 2016-11-03 NOTE — Progress Notes (Signed)
PULMONARY / CRITICAL CARE MEDICINE   Name: Sheri Dunn MRN: [id number] DOB: Aug 25, 1950    ADMISSION DATE:  10/26/2016 CONSULTATION DATE:  10/30/2016  REFERRING MD:  Dr. 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   Presents 5/9 with mental status changes, weakness, and hypotension. This morning patient reported lightheadedness and loss of vision. Reports that ever since surgery has had problems with hypotension and hypoglycemia. EKG revealed inferior ST elevation with reciprocal changes. Code STEMI activated. Cardiology consulted. Patient taken to Cath-Lab and given 3,000u of unfractionated heparin, it was then noted that the bulging of hernia had increased in size, with concern for internal bleed cath was held and patient taken back to ED. CT A/P revealed extraperitoneal fluid collection tracking deep to the rectus sheath, questioning seroma/hematoma not excluding infection. Surgery consulted. In ED patient had progressive hypotension, was given 1L bolus NS without improvement, requiring levophed gtt. PCCM asked to admit.   SUBJECTIVE:  Continues on CRRT Critically ill on multiple pressors More somnolent today WBC count is upto 73  VITAL SIGNS: BP (!) 112/59   Pulse (!) 112   Temp 97.5 F (36.4 C) (Axillary)   Resp 18   Ht 5\' 6"  (1.676 m)   Wt 139 lb 5.3 oz (63.2 kg)   SpO2 100%   BMI 22.49 kg/m   HEMODYNAMICS: CVP:  [2 mmHg] 2 mmHg  VENTILATOR SETTINGS:    INTAKE / OUTPUT: I/O last 3 completed shifts: In: 5093.5 [I.V.:4843.5; IV Piggyback:250] Out: 5684 [Other:5684]  PHYSICAL EXAMINATION: Gen:      No acute distress HEENT:  EOMI, sclera anicteric Neck:     No masses; no thyromegaly Lungs:    Clear to auscultation bilaterally; normal respiratory  effort CV:         Regular rate and rhythm; no murmurs Abd:      + bowel sounds; soft, non-tender; no palpable masses, no distension Ext:    No edema; adequate peripheral perfusion Skin:      Warm and dry; no rash Neuro: somnolent, arousable  LABS:  BMET  Recent Labs Lab 10/17/16 0701 10/17/16 1614 2016-09-01 0432  NA 128* 132* 133*  K 3.9 4.1 4.1  CL 97* 100* 100*  CO2 19* 22 21*  BUN 74* 53* 37*  CREATININE 2.05* 1.62* 1.36*  GLUCOSE 156* 131* 133*    Electrolytes  Recent Labs Lab 10/16/16 0452  10/17/16 0403 10/17/16 0701 10/17/16 1614 2016-09-01 0432  CALCIUM 8.1*  < > 7.9* 7.7* 7.8* 7.7*  MG 1.9  --  2.0  --   --  2.2  PHOS 5.3*  < > 4.0 3.7 3.1 3.0  < > = values in this interval not displayed.  CBC  Recent Labs Lab 10/16/16 0452 10/17/16 0405 2016-09-01 0432  WBC 55.0* 58.9* 73.1*  HGB 10.5* 9.4* 9.1*  HCT 28.5* 26.0* 25.4*  PLT 171 150 138*    Coag's  Recent Labs Lab 10/19/2016 1237  APTT 91*  INR 1.80    Sepsis Markers  Recent Labs Lab 10/04/2016 1924  10/13/16 0130 10/16/16 1112 10/16/16 1455 10/17/16 0701 2016-09-01 0432  LATICACIDVEN 2.7*  --   --  2.0* 1.9  --   --   PROCALCITON  --   < > 27.38  --   --  11.06 7.39  < > = values in this interval not displayed.  ABG No results for input(s): PHART, PCO2ART, PO2ART in the last 168 hours.  Liver Enzymes  Recent Labs Lab 10/07/2016 1237 10/13/16 0130  10/17/16 0701 10/17/16 1614 Nov 17, 2016 0432  AST 31 25  --   --   --   --   ALT 14 14  --   --   --   --   ALKPHOS 62 52  --   --   --   --   BILITOT 2.4* 2.2*  --   --   --   --   ALBUMIN 2.5* 2.2*  < > 1.9* 1.9* 1.9*  < > = values in this interval not displayed.  Cardiac Enzymes  Recent Labs Lab 10/13/16 1024 10/13/16 1629 10/16/16 1711  TROPONINI 0.23* 0.20* 0.58*    Glucose  Recent Labs Lab 10/17/16 1206 10/17/16 1623 10/17/16 1953 11/17/16 0028 2016/11/17 0356 11/17/2016 0757  GLUCAP 130* 142* 153* 114* 172* 162*     Imaging Dg Chest Port 1 View  Result Date: 11-17-16 CLINICAL DATA:  Acute respiratory failure. EXAM: PORTABLE CHEST 1 VIEW COMPARISON:  10/17/2016. FINDINGS: Left IJ dual-lumen catheter and right IJ central line in stable position. Cardiac pacer stable position. Stable cardiomegaly. Diffuse bilateral pulmonary infiltrates/edema noted. Bilateral pleural effusions noted. Low lung volumes. No change from prior exam. No pneumothorax. IMPRESSION: 1.  Bilateral IJ lines in stable position. 2. Persistent unchanged cardiomegaly with bilateral pulmonary infiltrates/ edema and bilateral pleural effusions. Findings suggest CHF. 3. Low lung volumes. Electronically Signed   By: Maisie Fus  Register   On: 2016-11-17 07:29     STUDIES:  CT A/P 5/9 >> Moderate Volume Ascites, interval development of an apparent extraperitoneal fluid collection tracking deep to the rectus sheath beginning just cranial to the umbilicus and tracking down towards the symphysis pubis. Seroma/Hematoma consideration, infection not excluded, fluid collection tracking along the anterolateral right lower abdominal wall appears to be extraperitoneal although, bilateral fluid collections identified in the groin regions, moderate ascites, cardiomegaly  CXR 5/9 >>  bibasilar atelectasis, chronic marked cardiomegaly   CULTURES: UC 5/10 >> neg Blood 5/9 >> 1/2 Ecoli >> pan sensitive BC 5/13 >>  ANTIBIOTICS: Vancomycin 5/9 >> 5/10; 5/14 >> Zosyn 5/9 >> 5/10; 5/14 >> Clindamycin 5/9 >> 5/10 Rocephin 5/10 >> 5/14  SIGNIFICANT EVENTS: 5/09  Admitted as Code STEMI  5/11  Remains on levo gtt, atrial flutter overnight, cardiology consulted, diuresing / on milrinone 5/13  Remains on pressors, hypotensive, new RLL infiltrate (suspected aspiration), worsening AKI 5/14 Started on CRRT  LINES/TUBES: R IJ 5/9 >> Lt HD cath 5/14 >>  DISCUSSION: 66 year old female with extensive cardiac history admitted on 5/9 as Code STEMI. While in Cath-lab  after being administered heparin she was noted to have increase in size of hernias, with concern for internal bleeding cath was held and patient was taken back to ED. In ED patient had significant hypotension requiring levophed gtt. Work up found her to have Ecoli bacteremia & abdominal hematoma on CT A/P.   ASSESSMENT / PLAN:  PULMONARY A: Pulmonary Edema -  RLL airspace disease - new infiltrate vs atelectasis, suspect aspiration is infiltrate given weakness Hypoxia P:   Supplemental O2 Aspiration precautions Bipap not indicated given the hemodynamic instability.  CARDIOVASCULAR A:  STEMI Hypotension related to STEMI + E. Coli bacteremia sepsis  H/O CHF (EF 20-25), CAD S/P ICD, A.Flutter  Mild Pulm Edema PAFib/flutter P:  Continue  heparin, amio Wean pressors as tolerated Stress dose steroids Holding home HTN meds  RENAL A:   Acute Kidney Injury 2/2 shock ( STEMI + sepsis with Ecoli bacteremia) Lactic Acidosis  P:   Nephrology following, appreciate assistance Continue CRRT, keep even  GASTROINTESTINAL A:   Cirrhosis  Malnutrition, moderate S/P Recent Hernia (Umbilical + Obturator) Repair with Seroma / Hematoma  P:  Keep NPO Aspiration precautions. Protonix for SUP  HEMATOLOGIC A:   Leukopenia  Leukocytosis Suspected Abdominal Hematoma - increase in size of hernias during cath  Anemia P:  Follow CBC, fever curve Heparin per pharmacy  INFECTIOUS A:   Septic Shock 2/2 Ecoli bacteremia.  Significant increase in WBC from 15-> 34.5-> 55, ? Source new RLL inflitrate/ GI source P:   Continue vanco, zosyn Follow cultures, Pct  ENDOCRINE A:   Hypoglycemia - resolved  P:   Follow glucose  NEUROLOGIC A:   Acute Encephalopathy - resolved   P:   Ongoing monitoring/ supportive care Minimize sedating meds  FAMILY  - Updates:  Updated family today. I told them that she is worsening despite aggressive care. I recommended that we stop and make her comfort.  They are moving towards this decision but want to discuss with the rest of the family  - Inter-disciplinary family meet or Palliative Care meeting done  The patient is critically ill with multiple organ system failure and requires high complexity decision making for assessment and support, frequent evaluation and titration of therapies, advanced monitoring, review of radiographic studies and interpretation of complex data.   Critical Care Time devoted to patient care services, exclusive of separately billable procedures, described in this note is 35  minutes.   Chilton Greathouse MD [facility] Pager [phone] If no answer or after 3pm call: [phone] 10/20/2016, 9:54 AM

## 2016-11-03 NOTE — Progress Notes (Signed)
Progress Note  Patient Name: Sheri Dunn Date of Encounter: 10/20/2016  Primary Cardiologist: Daviona Herbert  Subjective   Continues to deteriorate. Requiring high doses of pressors, unable to remove fluid with CRRT due to hypotension. Increasing O2 requirements. Anuric. Obtunded, barely responsive, despite only mildly abnormal metabolic parameters. Hypotehrmic earlier, now OK with warming blanket. WBC 73K. Procalcitonin lower, but still markedly abnormal.  Inpatient Medications    Scheduled Meds: . chlorhexidine  15 mL Mouth Rinse BID  . Chlorhexidine Gluconate Cloth  6 each Topical Daily  . hydrocortisone sod succinate (SOLU-CORTEF) inj  50 mg Intravenous Q6H  . insulin aspart  0-9 Units Subcutaneous Q4H  . mouth rinse  15 mL Mouth Rinse q12n4p  . pantoprazole (PROTONIX) IV  40 mg Intravenous Q24H  . polyethylene glycol  17 g Oral BID  . senna-docusate  1 tablet Oral BID  . sodium chloride flush  10-40 mL Intracatheter Q12H   Continuous Infusions: . sodium chloride    . sodium chloride    . acetaminophen    . amiodarone 30 mg/hr (10/25/2016 0800)  . epinephrine Stopped (10/17/16 1218)  . heparin 900 Units/hr (10/13/2016 0800)  . norepinephrine (LEVOPHED) Adult infusion 40 mcg/min (10/08/2016 1000)  . phenylephrine (NEO-SYNEPHRINE) Adult infusion 100 mcg/min (10/23/2016 1000)  . piperacillin-tazobactam (ZOSYN)  IV Stopped (10/14/2016 0908)  . dialysis replacement fluid (prismasate) 350 mL/hr at 10/17/16 2344  . dialysis replacement fluid (prismasate) 300 mL/hr at 10/26/2016 0458  . dialysate (PRISMASATE) 1,500 mL/hr at 10/21/2016 0833  . sodium chloride    . vancomycin Stopped (10/17/16 1153)  . vasopressin (PITRESSIN) infusion - *FOR SHOCK* Stopped (10/16/16 0456)   PRN Meds: sodium chloride, acetaminophen, acetaminophen, acetaminophen, alum & mag hydroxide-simeth, bisacodyl, heparin, lip balm, magic mouthwash, menthol-cetylpyridinium, metoprolol tartrate, oxyCODONE, phenol,  polyethylene glycol, sodium chloride, sodium chloride flush   Vital Signs    Vitals:   10/11/2016 0700 10/31/2016 0800 10/05/2016 0900 10/11/2016 1000  BP: (!) 108/56 (!) 96/58 (!) 112/59 (!) 98/59  Pulse: (!) 108 (!) 105 (!) 112 (!) 104  Resp: 17 20 18  (!) 21  Temp:  97.5 F (36.4 C)    TempSrc:  Axillary    SpO2: 100% 99% 100% 100%  Weight:      Height:        Intake/Output Summary (Last 24 hours) at 10/17/2016 1027 Last data filed at 10/08/2016 1000  Gross per 24 hour  Intake             3174 ml  Output             3144 ml  Net               30 ml   Filed Weights   10/16/16 0445 10/17/16 0400 10/14/2016 0600  Weight: 137 lb 12.6 oz (62.5 kg) 156 lb 15.5 oz (71.2 kg) 139 lb 5.3 oz (63.2 kg)    Telemetry    Irregular rhythm appears to be sinus tachycardia with runs of SVT/atrial flutter - Personally Reviewed  ECG    No new tracing - Personally Reviewed  Physical Exam  Obtunded, minor response to stimulation, eyes closed GEN: No acute distress.   Neck: >10 cm JVD Cardiac: irregular, no murmurs, rubs, or gallops.  Respiratory: Clear to auscultation bilaterally. GI: Soft, nontender, mildly distended  MS: 2+ dependent edema; No deformity. Neuro:  Nonfocal limited exam  Psych: Normal affect   Labs    Chemistry Recent Labs Lab 11-01-16 1237  10/13/16 0130  10/17/16 0701 10/17/16 1614 10/14/2016 0432  NA 132*  < > 125*  < > 128* 132* 133*  K 4.6  < > 3.5  < > 3.9 4.1 4.1  CL 98*  < > 90*  < > 97* 100* 100*  CO2 17*  < > 21*  < > 19* 22 21*  GLUCOSE 43*  < > 89  < > 156* 131* 133*  BUN 53*  < > 57*  < > 74* 53* 37*  CREATININE 2.63*  < > 2.17*  < > 2.05* 1.62* 1.36*  CALCIUM 8.0*  < > 7.4*  < > 7.7* 7.8* 7.7*  PROT 5.8*  --  5.5*  --   --   --   --   ALBUMIN 2.5*  --  2.2*  < > 1.9* 1.9* 1.9*  AST 31  --  25  --   --   --   --   ALT 14  --  14  --   --   --   --   ALKPHOS 62  --  52  --   --   --   --   BILITOT 2.4*  --  2.2*  --   --   --   --   GFRNONAA 18*  < > 22*   < > 24* 32* 39*  GFRAA 21*  < > 26*  < > 28* 37* 46*  ANIONGAP 17*  < > 14  < > 12 10 12   < > = values in this interval not displayed.   Hematology Recent Labs Lab 10/16/16 0452 10/17/16 0405 10/16/2016 0432  WBC 55.0* 58.9* 73.1*  RBC 3.79* 3.50* 3.43*  HGB 10.5* 9.4* 9.1*  HCT 28.5* 26.0* 25.4*  MCV 75.2* 74.3* 74.1*  MCH 27.7 26.9 26.5  MCHC 36.8* 36.2* 35.8  RDW 17.3* 17.0* 17.5*  PLT 171 150 138*    Cardiac Enzymes Recent Labs Lab 10/13/16 0442 10/13/16 1024 10/13/16 1629 10/16/16 1711  TROPONINI 0.27* 0.23* 0.20* 0.58*    Recent Labs Lab Jul 19, 2016 1204  TROPIPOC 0.88*     BNPNo results for input(s): BNP, PROBNP in the last 168 hours.   DDimer No results for input(s): DDIMER in the last 168 hours.   Radiology    Dg Chest Port 1 View  Result Date: 10/05/2016 CLINICAL DATA:  Acute respiratory failure. EXAM: PORTABLE CHEST 1 VIEW COMPARISON:  10/17/2016. FINDINGS: Left IJ dual-lumen catheter and right IJ central line in stable position. Cardiac pacer stable position. Stable cardiomegaly. Diffuse bilateral pulmonary infiltrates/edema noted. Bilateral pleural effusions noted. Low lung volumes. No change from prior exam. No pneumothorax. IMPRESSION: 1.  Bilateral IJ lines in stable position. 2. Persistent unchanged cardiomegaly with bilateral pulmonary infiltrates/ edema and bilateral pleural effusions. Findings suggest CHF. 3. Low lung volumes. Electronically Signed   By: Maisie Fushomas  Register   On: 10/19/2016 07:29   Dg Chest Port 1 View  Result Date: 10/17/2016 CLINICAL DATA:  Acute respiratory failure. EXAM: PORTABLE CHEST 1 VIEW COMPARISON:  10/16/2016 FINDINGS: The patient is significantly rotated to the left which limits assessment. The cardiomediastinal silhouette is grossly unchanged within this limitation. A single lead ICD remains in place. Bilateral jugular catheters remain in place. Bibasilar lung opacities do not appear significantly changed and may reflect a  combination of pleural effusion and atelectasis or consolidation. No pneumothorax is identified. IMPRESSION: Unchanged appearance of the chest allowing for patient rotation. Persistent bilateral pleural effusions and bibasilar atelectasis or consolidation. Electronically Signed  By: Sebastian Ache M.D.   On: 10/17/2016 07:29   Dg Chest Port 1 View  Result Date: 10/16/2016 CLINICAL DATA:  LEFT central line placement EXAM: PORTABLE CHEST 1 VIEW COMPARISON:  10/16/2016 FINDINGS: Stable enlarged cardiac silhouette. Interval placement of a LEFT central venous line with tip overlying the distal SVC. No change in RIGHT central line. No pneumothorax. Stable bilateral pleural effusions. IMPRESSION: No pneumothorax following central line placement. Tip projects over the distal SVC. Electronically Signed   By: Genevive Bi M.D.   On: 10/16/2016 15:05     Patient Profile     66 y.o. 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  Underwent bilateral inguinal and obturator hernia repairs on April 26, kept in hospital for decompensated heart failure. Discharge May 5 and readmitted with hypotension May 9. Initial concern for new acute myocardial infarction was not confirmed by cardiac enzymes and repeat echo. May 9 blood cultures growing Escherichia coli. Combined septic and cardiogenic shock.  Assessment & Plan    1. Shock: mixed cardiogenic and septic shock. Deteriorating hemodynamics despite pressors. 2. Anuric ARF:  marked volume overload with worsening respiratory function. Unable to remove volume with CRRT. 3. CHF: developing worsening pulmonary edema. 4. CAD: no acute coronary sd, but likely has widespread ischemia due to increased demand/pressors/hypotension 5. Sepsis: Antibiotics tailored for identified GNR  organism, but marked elevation in WBC and persistently high WBC.Marland Kitchen 6. Cirrhosis: Suggested by imaging studies and may be related to chronic right heart failure.  Had a lengthy discussion with family about the goals of care. They are sensing the futility of our aggressive efforts and do not want to see their loved one suffer. They do not want intubation or CPR.  We discussed discontinuation of ICD therapies and CRRT and they agreed with these changes immediately. We also discussed discontinuation of pressors. They would like to delay that until the rest of her family can be present. We can provide comfort measures.  Signed, Thurmon Fair, MD  10/24/2016, 10:27 AM

## 2016-11-03 NOTE — Progress Notes (Signed)
Subjective:  CRRT running fine-  Overnight had continued hypotension- required increase of pressors- CVP has been less than 5 but sodium was 119 upon initiation- is 133 this AM Objective Vital signs in last 24 hours: Vitals:   10/28/2016 0630 10/08/2016 0645 10/10/2016 0700 10/06/2016 0800  BP: (!) 101/55  (!) 108/56 (!) 96/58  Pulse: (!) 108 (!) 105 (!) 108 (!) 105  Resp: (!) 25 (!) 21 17 20   Temp:    97.5 F (36.4 C)  TempSrc:    Axillary  SpO2: (!) 79% 100% 100% 99%  Weight:      Height:       Weight change: -8 kg (-17 lb 10.2 oz)  Intake/Output Summary (Last 24 hours) at 10/03/2016 4098 Last data filed at 10/10/2016 0800  Gross per 24 hour  Intake          3153.93 ml  Output             3323 ml  Net          -169.07 ml    Assessment/ Plan: [name] with previous EF 20-25% who was admitted on 10/30/16 with STEMI, then development of imtraabdominal hematoma/abscess and E. Coli bacteremia causing shock/hypotension and now AKI Assessment/Plan: 1. Renal- AKI secondary to hemodynamics in the setting of shock- possibly both cardiogenic and septic- essentially no UOP.  Have started CRRT as plan is still for aggressive care- however unfortunately is not improving overall.  We will take CCM lead on this and continue CRRT for now- elytes stable sodium improved  2. Cardiac- prior EF 20-25 % - recent STEMI and no ability to evaluate coronary status.  Is requiring milrinone. Amiodarone, norepi and phenylephrine at this time- marginal BP  3. Anemia- hgb 9.1 at this time s/p transfusion 4. ID- WBC climbing- previously on rocephin- indicates infection is not being eradicated- thought to be intraabdominal source but patient not a candidate for surgery - changed to zosyn and vanc- WBC 73 K today  5. HTN/volume- volume overloaded- 10 liters positive with hyponatremia- not responsive to diuretics- only avenue to remove volume is CRRT - not sure I believe low CVP with hyponatremia (would normally  be indicator of volume overload)  Removed some with CRRT yest- negative 270-   Sodium correcting with CRRT.  I guess could bolus outside CRRT to see if BP improved but risk worsening pulm status ?? 6. Dispo- I spoke to family - said that there were things that dialysis would not fix, ie her infection with WBC 73 and her heart but continuing aggressive care for now- poor prognosis   Sheri Dunn A    Labs: Basic Metabolic Panel:  Recent Labs Lab 10/17/16 0701 10/17/16 1614 10/17/2016 0432  NA 128* 132* 133*  K 3.9 4.1 4.1  CL 97* 100* 100*  CO2 19* 22 21*  GLUCOSE 156* 131* 133*  BUN 74* 53* 37*  CREATININE 2.05* 1.62* 1.36*  CALCIUM 7.7* 7.8* 7.7*  PHOS 3.7 3.1 3.0   Liver Function Tests:  Recent Labs Lab Oct 30, 2016 1237 10/13/16 0130  10/17/16 0701 10/17/16 1614 10/19/2016 0432  AST 31 25  --   --   --   --   ALT 14 14  --   --   --   --   ALKPHOS 62 52  --   --   --   --   BILITOT 2.4* 2.2*  --   --   --   --  PROT 5.8* 5.5*  --   --   --   --   ALBUMIN 2.5* 2.2*  < > 1.9* 1.9* 1.9*  < > = values in this interval not displayed. No results for input(s): LIPASE, AMYLASE in the last 168 hours.  Recent Labs Lab 10/04/2016 1420 10/17/16 0830  AMMONIA 34 43*   CBC:  Recent Labs Lab 10/27/2016 1208  10/14/16 0420 10/15/16 0435 10/16/16 0452 10/17/16 0405 11-Nov-2016 0432  WBC 3.9*  < > 15.0* 34.5* 55.0* 58.9* 73.1*  NEUTROABS 2.7  --   --   --   --   --   --   HGB 9.8*  < > 8.7* 10.4* 10.5* 9.4* 9.1*  HCT 29.0*  < > 25.4* 28.7* 28.5* 26.0* 25.4*  MCV 81.7  < > 78.6 77.2* 75.2* 74.3* 74.1*  PLT 311  < > 208 195 171 150 138*  < > = values in this interval not displayed. Cardiac Enzymes:  Recent Labs Lab 10/12/16 0304 10/13/16 0442 10/13/16 1024 10/13/16 1629 10/16/16 1711  TROPONINI 0.39* 0.27* 0.23* 0.20* 0.58*   CBG:  Recent Labs Lab 10/17/16 1206 10/17/16 1623 10/17/16 1953 2016/11/11 0356 November 11, 2016 0757  GLUCAP 130* 142* 153* 172* 162*     Iron Studies: No results for input(s): IRON, TIBC, TRANSFERRIN, FERRITIN in the last 72 hours. Studies/Results: Dg Chest Port 1 View  Result Date: 11/11/2016 CLINICAL DATA:  Acute respiratory failure. EXAM: PORTABLE CHEST 1 VIEW COMPARISON:  10/17/2016. FINDINGS: Left IJ dual-lumen catheter and right IJ central line in stable position. Cardiac pacer stable position. Stable cardiomegaly. Diffuse bilateral pulmonary infiltrates/edema noted. Bilateral pleural effusions noted. Low lung volumes. No change from prior exam. No pneumothorax. IMPRESSION: 1.  Bilateral IJ lines in stable position. 2. Persistent unchanged cardiomegaly with bilateral pulmonary infiltrates/ edema and bilateral pleural effusions. Findings suggest CHF. 3. Low lung volumes. Electronically Signed   By: Maisie Fus  Register   On: November 11, 2016 07:29   Dg Chest Port 1 View  Result Date: 10/17/2016 CLINICAL DATA:  Acute respiratory failure. EXAM: PORTABLE CHEST 1 VIEW COMPARISON:  10/16/2016 FINDINGS: The patient is significantly rotated to the left which limits assessment. The cardiomediastinal silhouette is grossly unchanged within this limitation. A single lead ICD remains in place. Bilateral jugular catheters remain in place. Bibasilar lung opacities do not appear significantly changed and may reflect a combination of pleural effusion and atelectasis or consolidation. No pneumothorax is identified. IMPRESSION: Unchanged appearance of the chest allowing for patient rotation. Persistent bilateral pleural effusions and bibasilar atelectasis or consolidation. Electronically Signed   By: Sebastian Ache M.D.   On: 10/17/2016 07:29   Dg Chest Port 1 View  Result Date: 10/16/2016 CLINICAL DATA:  LEFT central line placement EXAM: PORTABLE CHEST 1 VIEW COMPARISON:  10/16/2016 FINDINGS: Stable enlarged cardiac silhouette. Interval placement of a LEFT central venous line with tip overlying the distal SVC. No change in RIGHT central line. No  pneumothorax. Stable bilateral pleural effusions. IMPRESSION: No pneumothorax following central line placement. Tip projects over the distal SVC. Electronically Signed   By: Genevive Bi M.D.   On: 10/16/2016 15:05   Medications: Infusions: . sodium chloride    . sodium chloride    . acetaminophen    . amiodarone 30 mg/hr (11-11-16 0800)  . epinephrine Stopped (10/17/16 1218)  . heparin 900 Units/hr (2016/11/11 0800)  . norepinephrine (LEVOPHED) Adult infusion 38 mcg/min (November 11, 2016 0800)  . phenylephrine (NEO-SYNEPHRINE) Adult infusion 90 mcg/min (11/11/2016 0800)  . piperacillin-tazobactam (ZOSYN)  IV 3.375 g (07-09-2016 0502)  . dialysis replacement fluid (prismasate) 350 mL/hr at 10/17/16 2344  . dialysis replacement fluid (prismasate) 300 mL/hr at 07-09-2016 0458  . dialysate (PRISMASATE) 1,500 mL/hr at 07-09-2016 0454  . sodium chloride    . vancomycin Stopped (10/17/16 1153)  . vasopressin (PITRESSIN) infusion - *FOR SHOCK* Stopped (10/16/16 0456)    Scheduled Medications: . chlorhexidine  15 mL Mouth Rinse BID  . Chlorhexidine Gluconate Cloth  6 each Topical Daily  . hydrocortisone sod succinate (SOLU-CORTEF) inj  50 mg Intravenous Q6H  . insulin aspart  0-9 Units Subcutaneous Q4H  . mouth rinse  15 mL Mouth Rinse q12n4p  . pantoprazole (PROTONIX) IV  40 mg Intravenous Q24H  . polyethylene glycol  17 g Oral BID  . senna-docusate  1 tablet Oral BID  . sodium chloride flush  10-40 mL Intracatheter Q12H    have reviewed scheduled and prn medications.  Physical Exam: General: not really arousable for me  this AM Heart: tachy Lungs: CBS bilat- claims no SOB- on HFNC- 9 liters now  Sat 99 Abdomen: firm area- she says is not tender Extremities: some peripheral edema    08-08-2016,8:26 AM  LOS: 7 days

## 2016-11-03 DEATH — deceased

## 2017-09-29 IMAGING — CR DG CHEST 1V
1 series · 1 of 1 positions shown · non-contrast
Comparison: Portable exam 4141 hours compared to 10/11/2016

CLINICAL DATA: Dyspnea today, history hypertension, coronary artery
disease, CHF, paroxysmal atrial flutter, prior NSTEMI, nonalcoholic
cirrhosis

EXAM:
CHEST 1 VIEW

[AP]
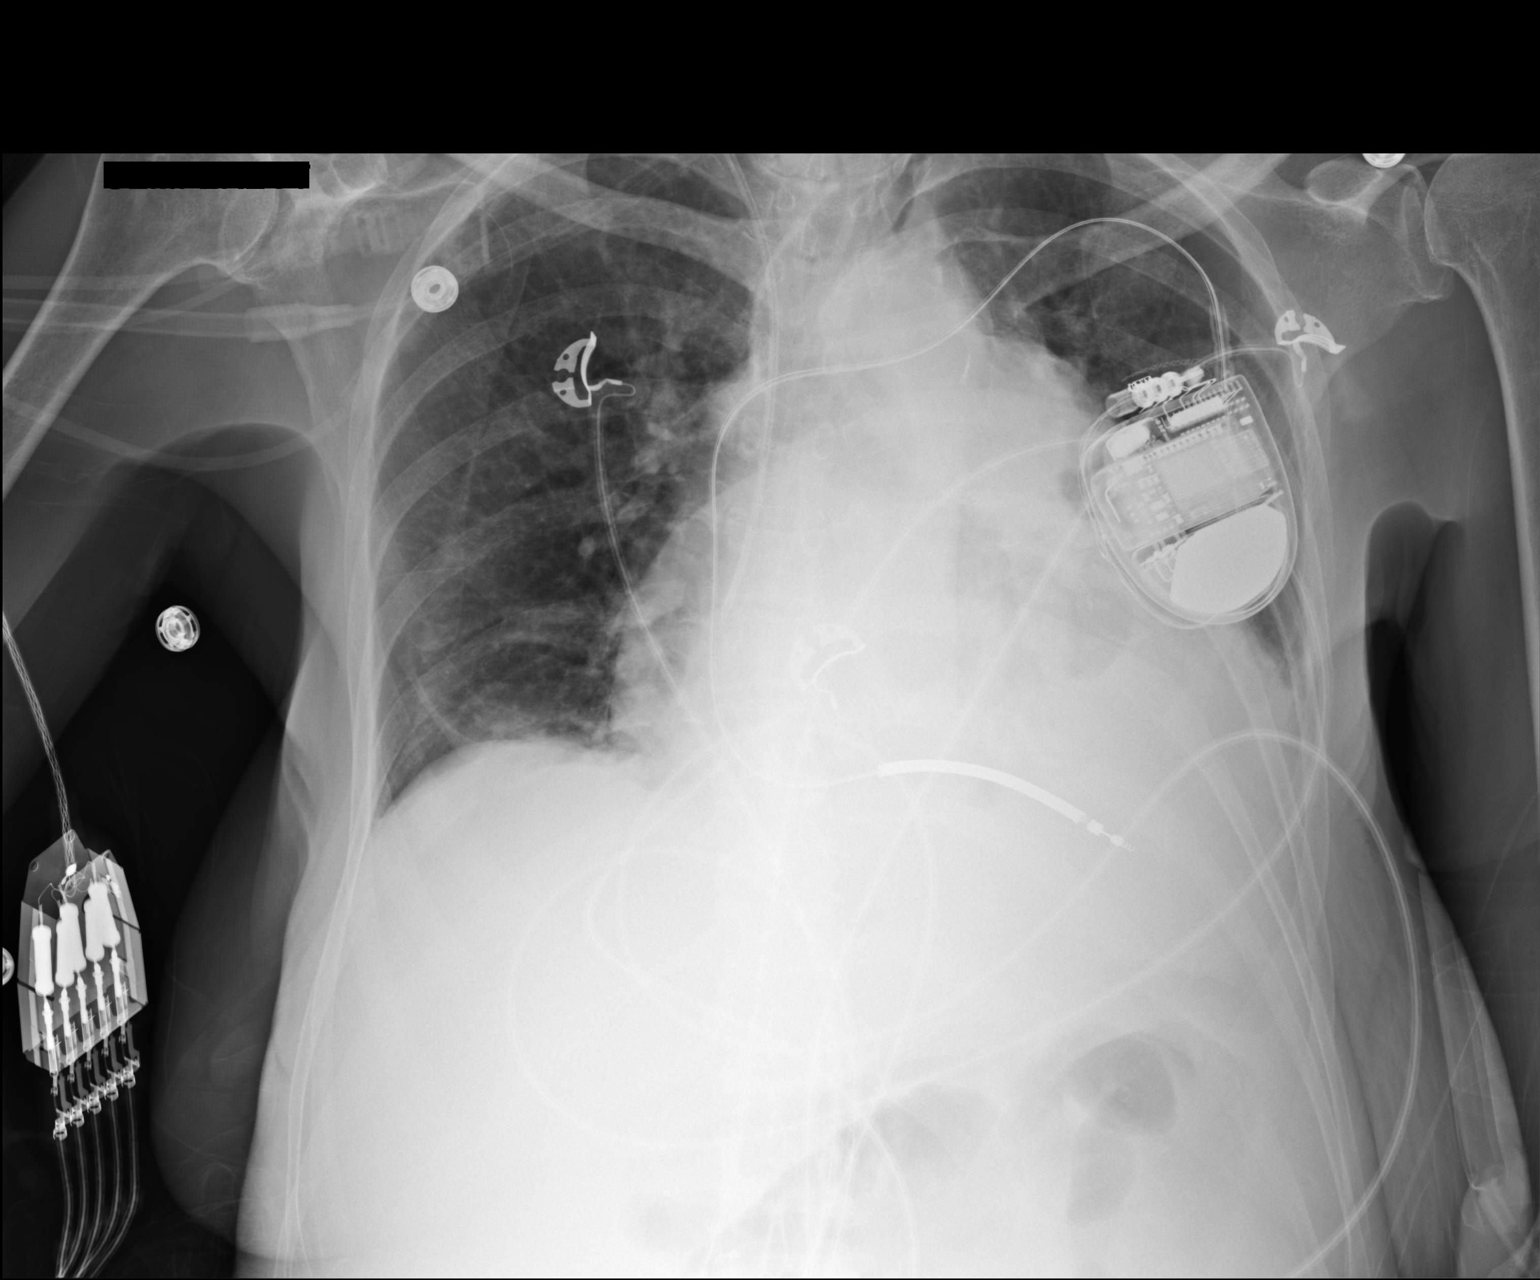

[1 of 1 positions shown; findings below may reference images not displayed]

FINDINGS: RIGHT jugular central venous catheter with tip projecting over upper
RIGHT atrium or cavoatrial junction.

LEFT subclavian transvenous AICD lead projects over RIGHT ventricle
unchanged.

Enlargement of cardiac silhouette with pulmonary vascular
congestion.

Atherosclerotic calcification aorta.

Mild persistent atelectasis at RIGHT base.

Persistent atelectasis versus consolidation in LEFT lower lobe,
cannot exclude component of LEFT pleural effusion.

Upper lungs clear.

No pneumothorax.
IMPRESSION: Enlargement of cardiac silhouette with pulmonary vascular
congestion.

Persistent atelectasis versus consolidation LEFT lower lobe with
suspect small LEFT pleural effusion.

Persistent mild RIGHT basilar atelectasis.

## 2017-10-03 IMAGING — DX DG CHEST 1V PORT
1 series · 1 of 1 positions shown · non-contrast
Comparison: 10/16/2016

CLINICAL DATA: LEFT central line placement

EXAM:
PORTABLE CHEST 1 VIEW

[chest ap]
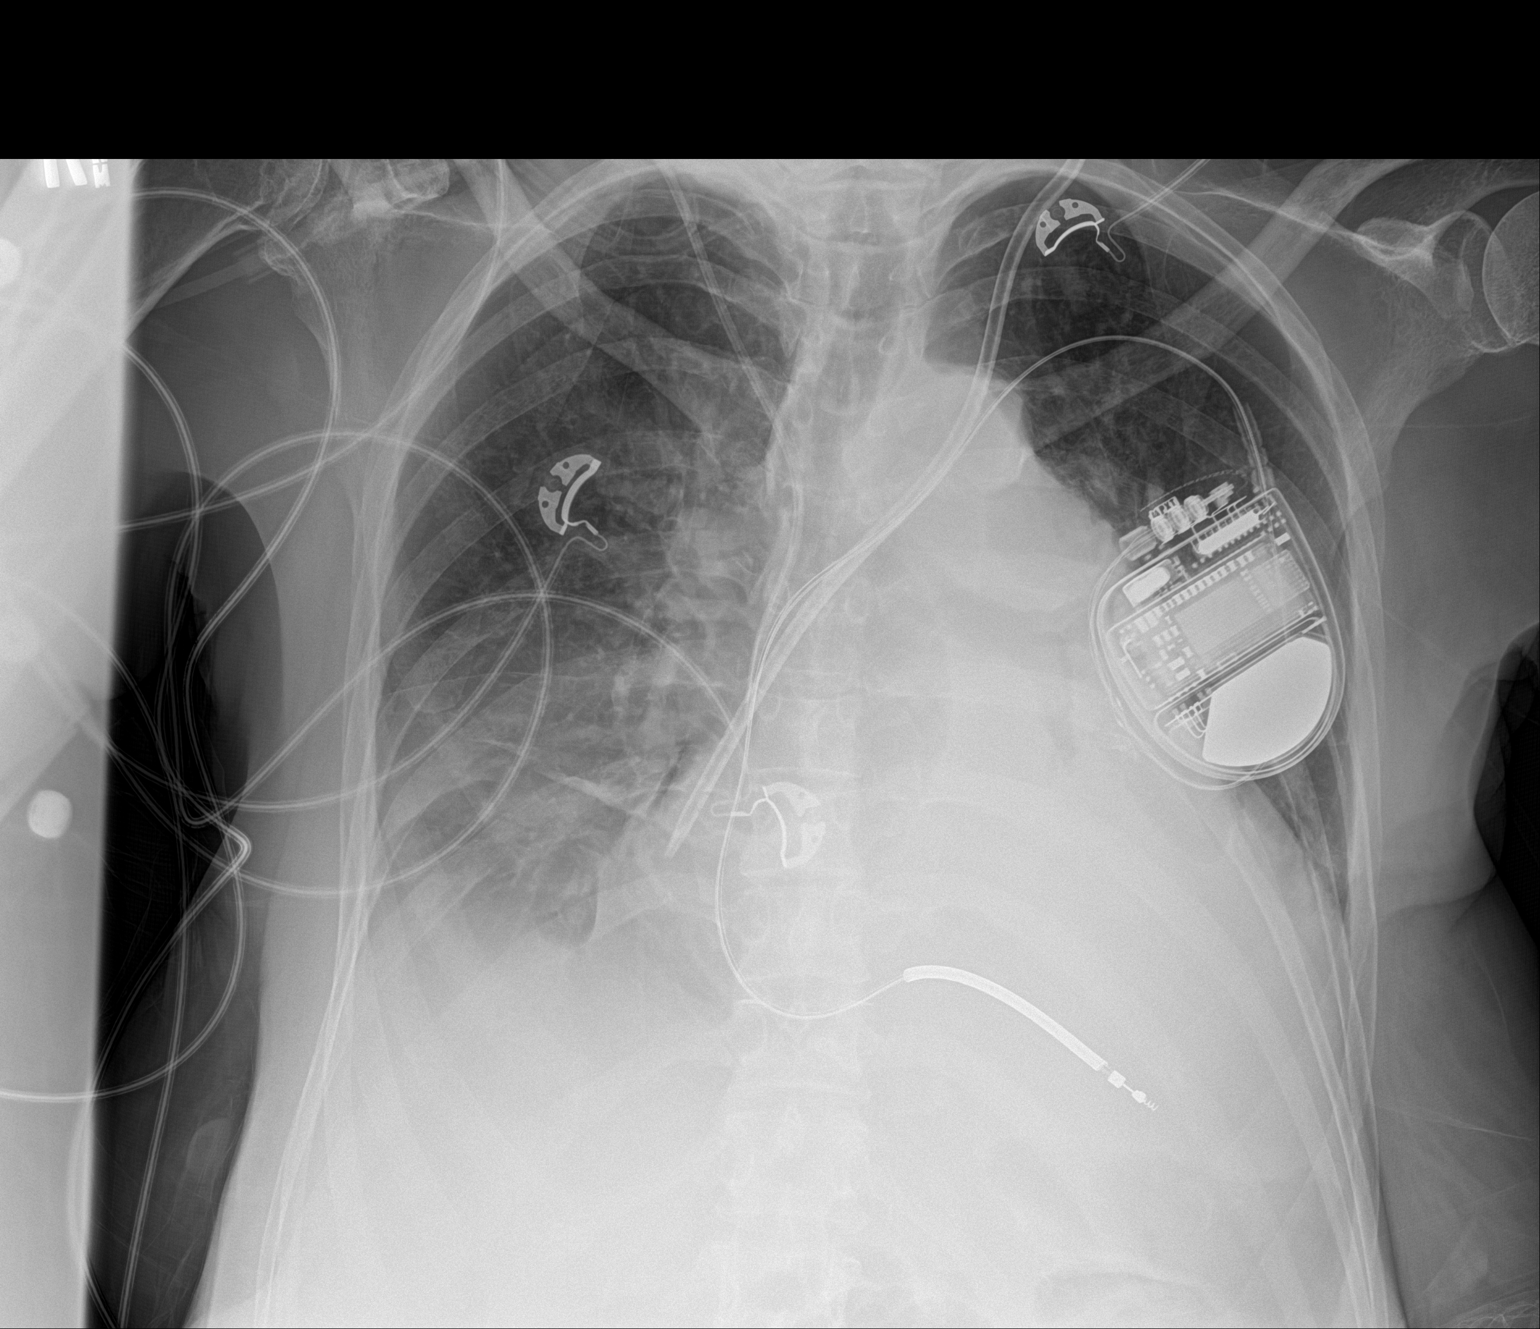

[1 of 1 positions shown; findings below may reference images not displayed]

FINDINGS: Stable enlarged cardiac silhouette. Interval placement of a LEFT
central venous line with tip overlying the distal SVC. No change in
RIGHT central line. No pneumothorax. Stable bilateral pleural
effusions.
IMPRESSION: No pneumothorax following central line placement.

Tip projects over the distal SVC.

## 2017-10-03 IMAGING — CR DG CHEST 1V PORT
1 series · 1 of 1 positions shown · non-contrast
Comparison: 10/15/2016.

CLINICAL DATA: 65-year-old female respiratory failure.
Hypertension. Subsequent encounter.

EXAM:
PORTABLE CHEST 1 VIEW

[AP]
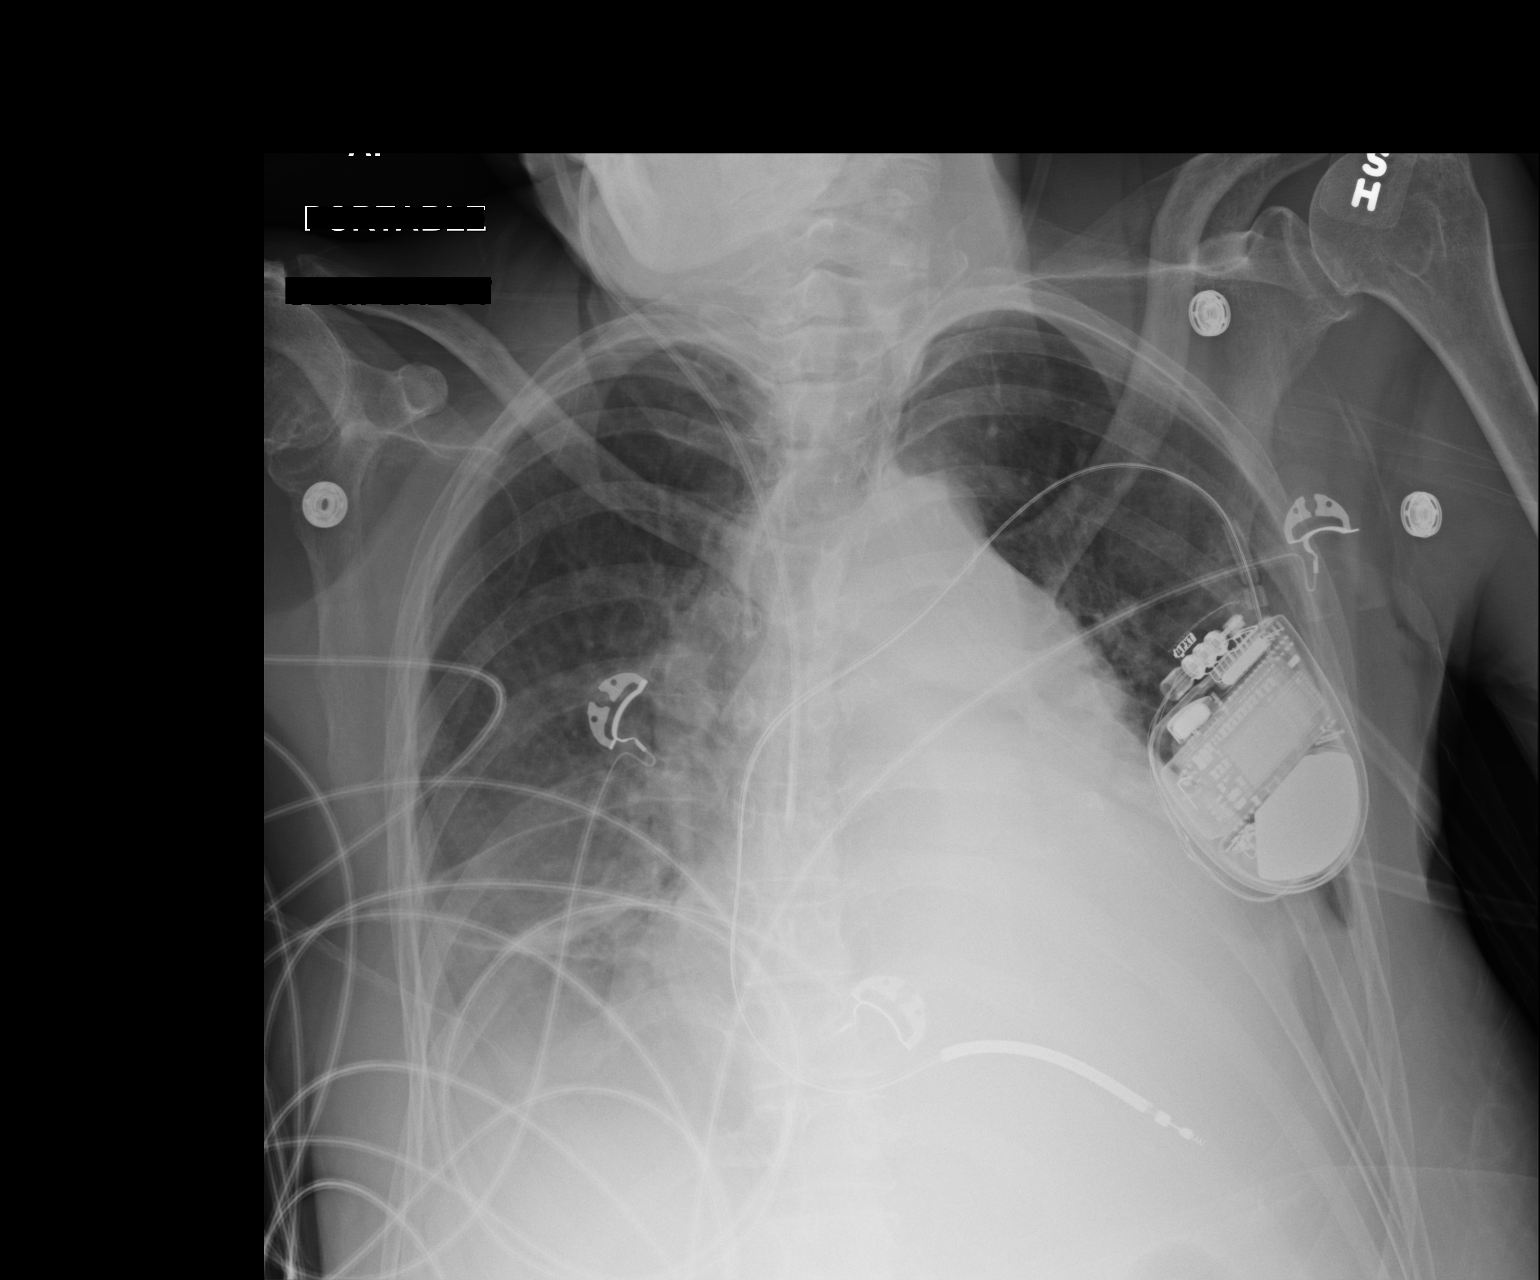

[1 of 1 positions shown; findings below may reference images not displayed]

FINDINGS: AICD enters from the left with tip unchanged in position.
Cardiomegaly.

Right central line tip distal superior vena cava/ cavoatrial
junction level unchanged. No pneumothorax.

Bibasilar consolidation may represent pleural fluid layering
posteriorly with atelectasis. Cannot exclude basilar infiltrate or
mass. When compared to prior examination, interval shifting of
right-sided pleural effusion.

Central pulmonary vascular prominence.
IMPRESSION: Shifting of right-sided pleural effusion. Consolidation lung bases
may represent pleural fluid with associated basilar atelectasis
although difficult to exclude basilar infiltrate or mass.

Cardiomegaly with AICD in place.

Central pulmonary vascular prominence.

## 2017-10-04 IMAGING — CR DG CHEST 1V PORT
1 series · 1 of 1 positions shown · non-contrast
Comparison: 10/16/2016

CLINICAL DATA: Acute respiratory failure.

EXAM:
PORTABLE CHEST 1 VIEW

[AP]
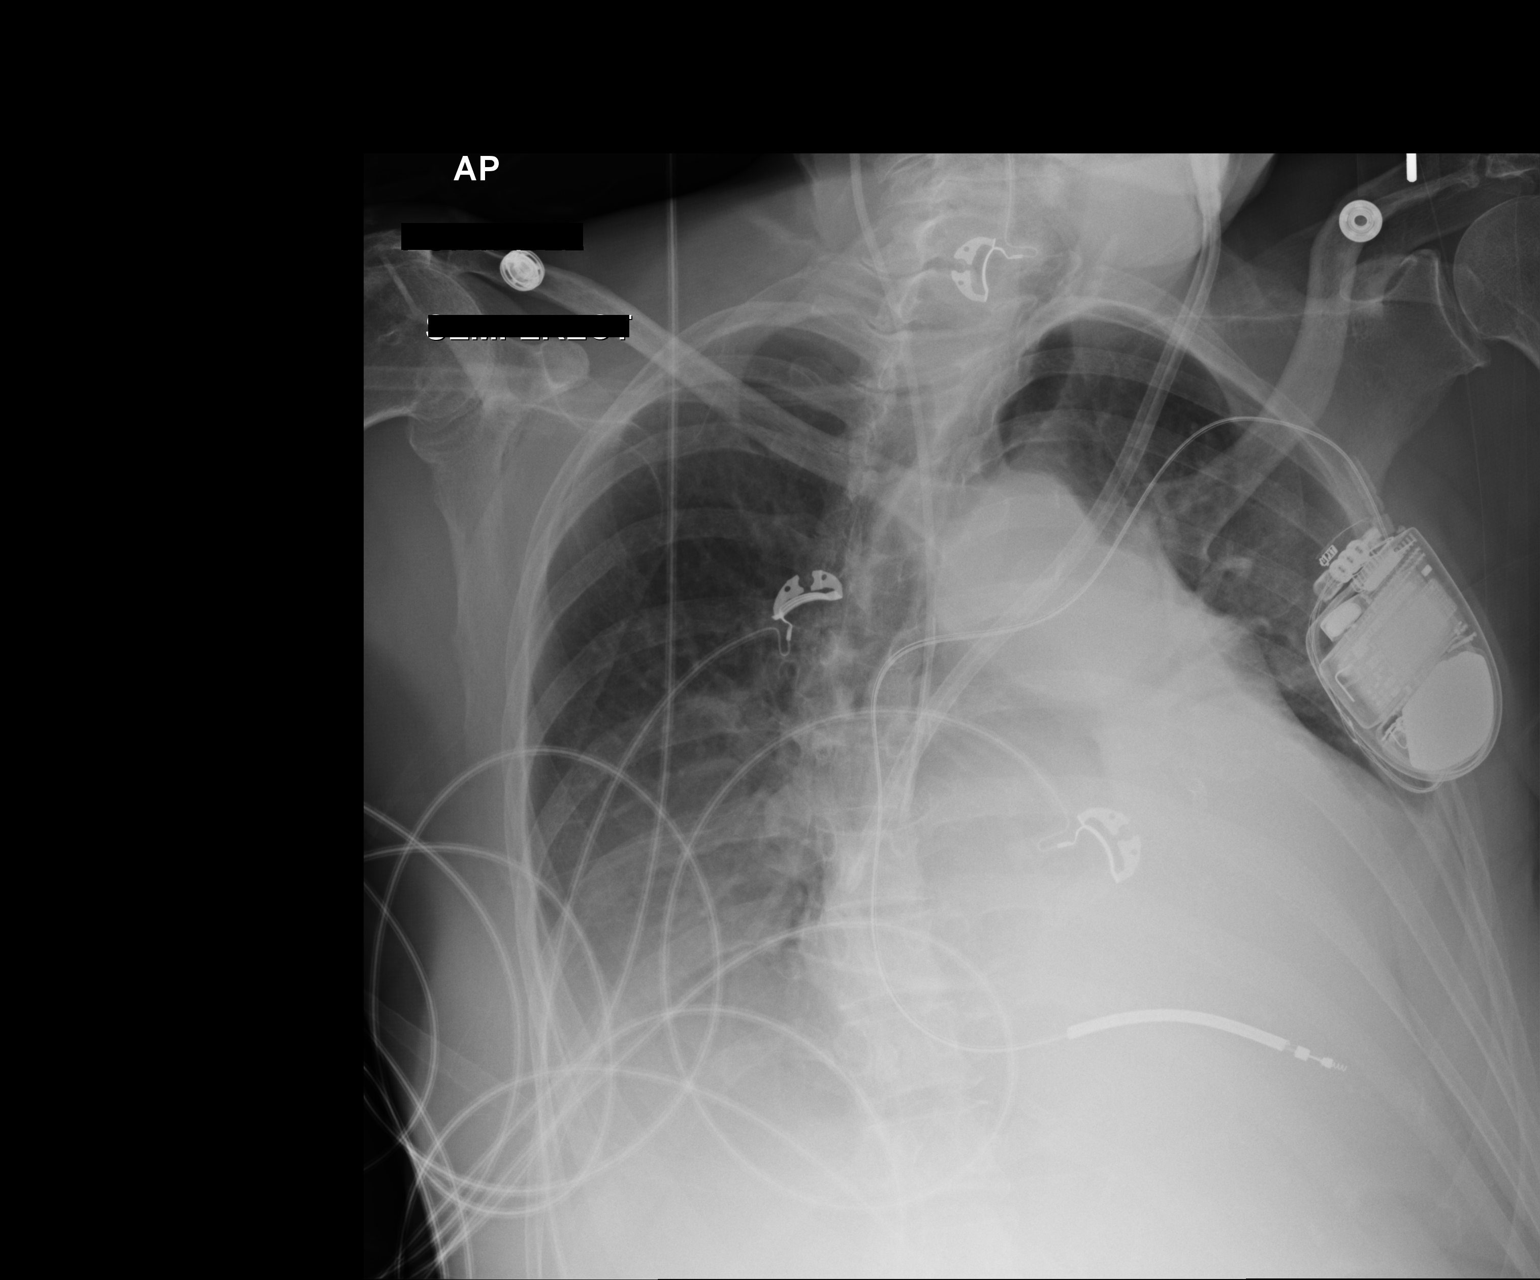

[1 of 1 positions shown; findings below may reference images not displayed]

FINDINGS: The patient is significantly rotated to the left which limits
assessment. The cardiomediastinal silhouette is grossly unchanged
within this limitation. A single lead ICD remains in place.
Bilateral jugular catheters remain in place. Bibasilar lung
opacities do not appear significantly changed and may reflect a
combination of pleural effusion and atelectasis or consolidation. No
pneumothorax is identified.
IMPRESSION: Unchanged appearance of the chest allowing for patient rotation.
Persistent bilateral pleural effusions and bibasilar atelectasis or
consolidation.

## 2017-10-05 IMAGING — CR DG CHEST 1V PORT
1 series · 1 of 1 positions shown · non-contrast
Comparison: 10/17/2016.

CLINICAL DATA: Acute respiratory failure.

EXAM:
PORTABLE CHEST 1 VIEW

[AP]
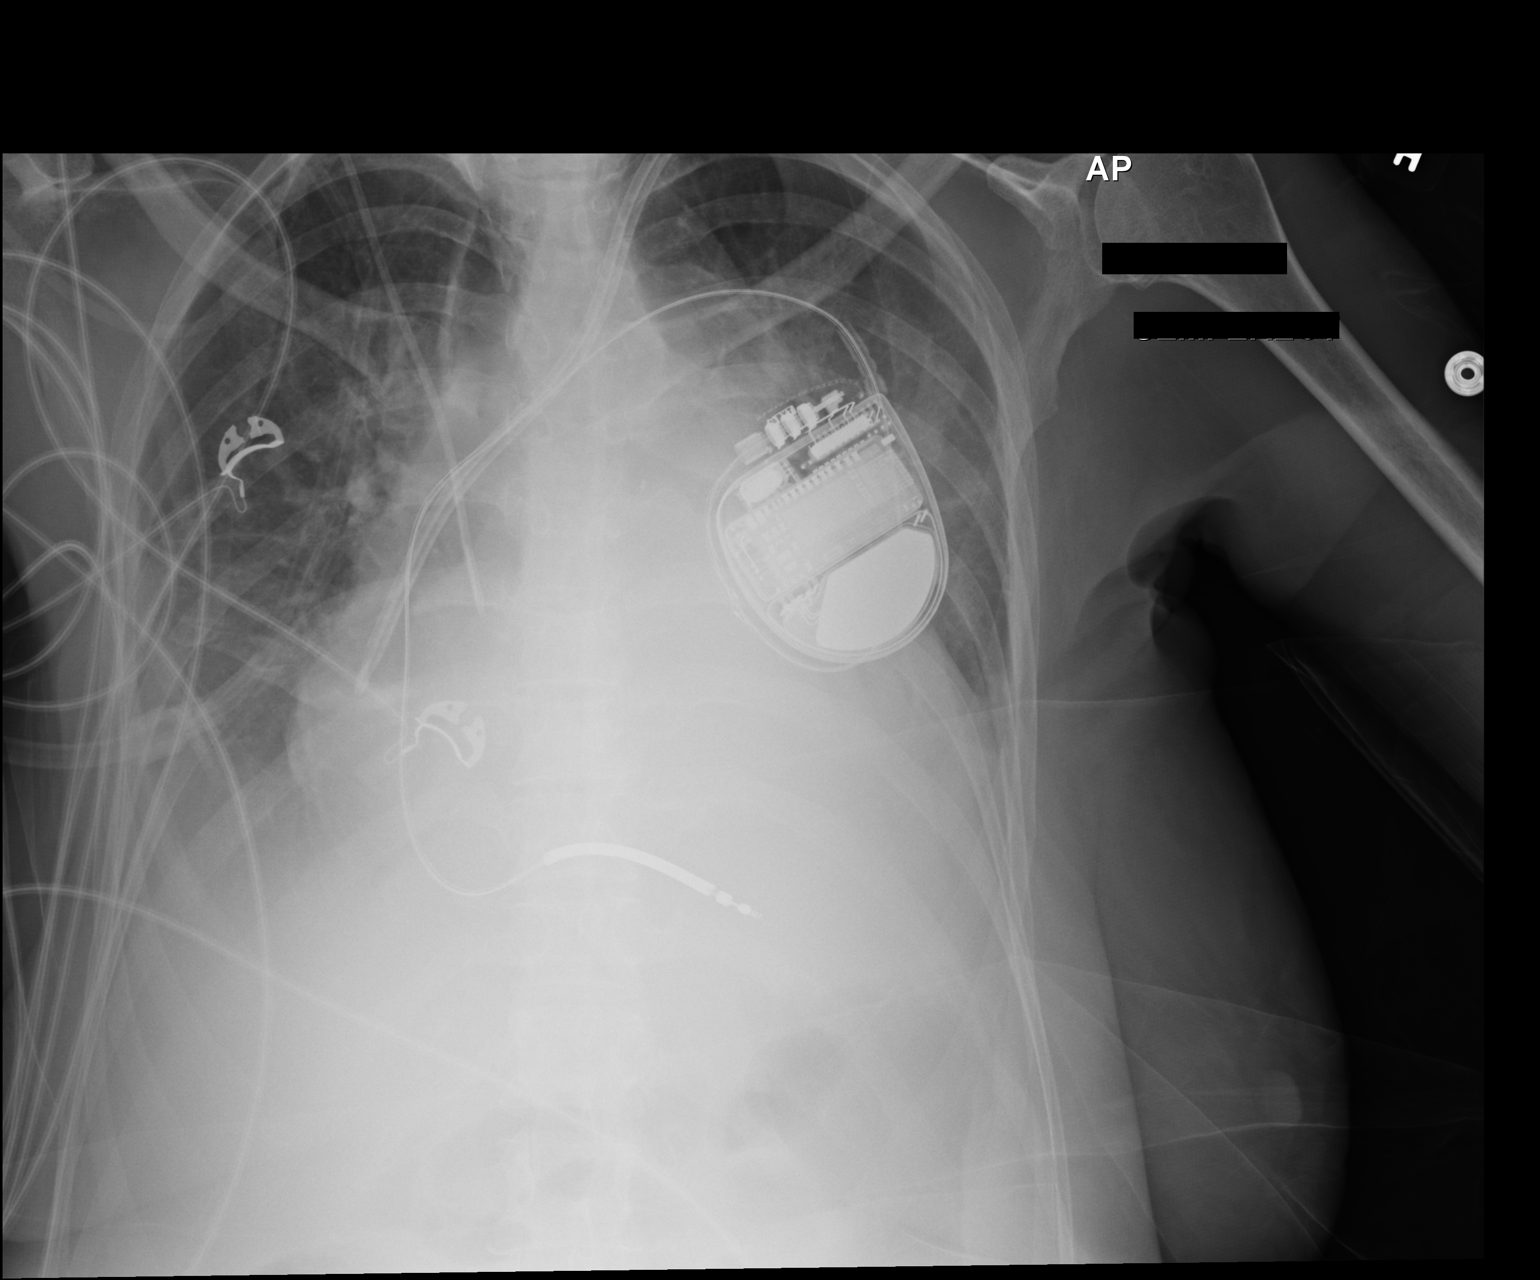

[1 of 1 positions shown; findings below may reference images not displayed]

FINDINGS: Left IJ dual-lumen catheter and right IJ central line in stable
position. Cardiac pacer stable position. Stable cardiomegaly.
Diffuse bilateral pulmonary infiltrates/edema noted. Bilateral
pleural effusions noted. Low lung volumes. No change from prior
exam. No pneumothorax.
IMPRESSION: 1.  Bilateral IJ lines in stable position.

2. Persistent unchanged cardiomegaly with bilateral pulmonary
infiltrates/ edema and bilateral pleural effusions. Findings suggest
CHF.

3. Low lung volumes.
